# Patient Record
Sex: Male | Born: 1945 | Race: White | Hispanic: No | Marital: Married | Smoking: Current some day smoker
Health system: Southern US, Community
[De-identification: ages and names within clinical notes are randomized; demographics above are authoritative.]

## PROBLEM LIST (undated history)

## (undated) DIAGNOSIS — G5600 Carpal tunnel syndrome, unspecified upper limb: Secondary | ICD-10-CM

## (undated) DIAGNOSIS — J449 Chronic obstructive pulmonary disease, unspecified: Secondary | ICD-10-CM

## (undated) DIAGNOSIS — I2510 Atherosclerotic heart disease of native coronary artery without angina pectoris: Secondary | ICD-10-CM

## (undated) DIAGNOSIS — C3490 Malignant neoplasm of unspecified part of unspecified bronchus or lung: Secondary | ICD-10-CM

## (undated) DIAGNOSIS — K219 Gastro-esophageal reflux disease without esophagitis: Secondary | ICD-10-CM

## (undated) DIAGNOSIS — M1990 Unspecified osteoarthritis, unspecified site: Secondary | ICD-10-CM

## (undated) DIAGNOSIS — C7931 Secondary malignant neoplasm of brain: Secondary | ICD-10-CM

## (undated) DIAGNOSIS — G709 Myoneural disorder, unspecified: Secondary | ICD-10-CM

## (undated) DIAGNOSIS — E785 Hyperlipidemia, unspecified: Secondary | ICD-10-CM

## (undated) DIAGNOSIS — Z923 Personal history of irradiation: Secondary | ICD-10-CM

## (undated) DIAGNOSIS — Z95820 Peripheral vascular angioplasty status with implants and grafts: Secondary | ICD-10-CM

## (undated) DIAGNOSIS — R0602 Shortness of breath: Secondary | ICD-10-CM

## (undated) DIAGNOSIS — G629 Polyneuropathy, unspecified: Secondary | ICD-10-CM

## (undated) DIAGNOSIS — N400 Enlarged prostate without lower urinary tract symptoms: Secondary | ICD-10-CM

## (undated) DIAGNOSIS — R21 Rash and other nonspecific skin eruption: Secondary | ICD-10-CM

## (undated) DIAGNOSIS — I82409 Acute embolism and thrombosis of unspecified deep veins of unspecified lower extremity: Secondary | ICD-10-CM

## (undated) DIAGNOSIS — M549 Dorsalgia, unspecified: Secondary | ICD-10-CM

## (undated) DIAGNOSIS — I219 Acute myocardial infarction, unspecified: Secondary | ICD-10-CM

## (undated) DIAGNOSIS — G8929 Other chronic pain: Secondary | ICD-10-CM

## (undated) DIAGNOSIS — I10 Essential (primary) hypertension: Secondary | ICD-10-CM

## (undated) DIAGNOSIS — G5601 Carpal tunnel syndrome, right upper limb: Secondary | ICD-10-CM

## (undated) HISTORY — PX: EPIDURAL BLOCK INJECTION: SHX1516

## (undated) HISTORY — PX: COLONOSCOPY W/ POLYPECTOMY: SHX1380

## (undated) HISTORY — DX: Rash and other nonspecific skin eruption: R21

---

## 1997-11-12 HISTORY — PX: CERVICAL FUSION: SHX112

## 1998-02-10 ENCOUNTER — Encounter
Admission: RE | Admit: 1998-02-10 | Discharge: 1998-05-11 | Payer: Self-pay | Admitting: Physical Medicine & Rehabilitation

## 1998-04-18 ENCOUNTER — Encounter: Admission: RE | Admit: 1998-04-18 | Discharge: 1998-07-17 | Payer: Self-pay | Admitting: Anesthesiology

## 1998-08-01 ENCOUNTER — Encounter: Admission: RE | Admit: 1998-08-01 | Discharge: 1998-10-20 | Payer: Self-pay | Admitting: Anesthesiology

## 1998-10-20 ENCOUNTER — Encounter: Admission: RE | Admit: 1998-10-20 | Discharge: 1999-01-18 | Payer: Self-pay | Admitting: Anesthesiology

## 1998-11-24 ENCOUNTER — Ambulatory Visit (HOSPITAL_COMMUNITY): Admission: RE | Admit: 1998-11-24 | Discharge: 1998-11-24 | Payer: Self-pay | Admitting: Anesthesiology

## 1998-11-24 ENCOUNTER — Encounter: Payer: Self-pay | Admitting: Anesthesiology

## 1999-01-19 ENCOUNTER — Encounter: Admission: RE | Admit: 1999-01-19 | Discharge: 1999-04-19 | Payer: Self-pay | Admitting: Anesthesiology

## 1999-02-03 ENCOUNTER — Encounter: Payer: Self-pay | Admitting: Cardiovascular Disease

## 1999-02-03 ENCOUNTER — Ambulatory Visit (HOSPITAL_COMMUNITY): Admission: RE | Admit: 1999-02-03 | Discharge: 1999-02-03 | Payer: Self-pay | Admitting: Cardiovascular Disease

## 1999-02-03 HISTORY — PX: CARDIAC CATHETERIZATION: SHX172

## 1999-04-20 ENCOUNTER — Encounter: Admission: RE | Admit: 1999-04-20 | Discharge: 1999-07-06 | Payer: Self-pay | Admitting: Anesthesiology

## 1999-07-04 ENCOUNTER — Encounter: Payer: Self-pay | Admitting: Neurosurgery

## 1999-07-04 ENCOUNTER — Ambulatory Visit (HOSPITAL_COMMUNITY): Admission: RE | Admit: 1999-07-04 | Discharge: 1999-07-04 | Payer: Self-pay | Admitting: Neurosurgery

## 1999-07-06 ENCOUNTER — Encounter: Admission: RE | Admit: 1999-07-06 | Discharge: 1999-10-04 | Payer: Self-pay | Admitting: Anesthesiology

## 1999-10-03 ENCOUNTER — Encounter: Payer: Self-pay | Admitting: Family Medicine

## 1999-10-03 ENCOUNTER — Encounter: Admission: RE | Admit: 1999-10-03 | Discharge: 1999-10-03 | Payer: Self-pay | Admitting: Family Medicine

## 1999-10-12 ENCOUNTER — Encounter: Admission: RE | Admit: 1999-10-12 | Discharge: 2000-01-10 | Payer: Self-pay | Admitting: Anesthesiology

## 2000-01-10 ENCOUNTER — Encounter: Admission: RE | Admit: 2000-01-10 | Discharge: 2000-01-22 | Payer: Self-pay | Admitting: Anesthesiology

## 2000-02-19 ENCOUNTER — Encounter: Admission: RE | Admit: 2000-02-19 | Discharge: 2000-05-19 | Payer: Self-pay | Admitting: Anesthesiology

## 2000-03-24 ENCOUNTER — Ambulatory Visit (HOSPITAL_COMMUNITY): Admission: RE | Admit: 2000-03-24 | Discharge: 2000-03-24 | Payer: Self-pay | Admitting: Neurosurgery

## 2000-03-24 ENCOUNTER — Encounter: Payer: Self-pay | Admitting: Neurosurgery

## 2000-04-04 ENCOUNTER — Ambulatory Visit (HOSPITAL_COMMUNITY): Admission: RE | Admit: 2000-04-04 | Discharge: 2000-04-04 | Payer: Self-pay | Admitting: Neurosurgery

## 2000-04-04 ENCOUNTER — Encounter: Payer: Self-pay | Admitting: Neurosurgery

## 2001-08-11 ENCOUNTER — Emergency Department (HOSPITAL_COMMUNITY): Admission: EM | Admit: 2001-08-11 | Discharge: 2001-08-12 | Payer: Self-pay | Admitting: Emergency Medicine

## 2001-08-11 ENCOUNTER — Encounter: Payer: Self-pay | Admitting: Emergency Medicine

## 2002-11-12 HISTORY — PX: BACK SURGERY: SHX140

## 2002-12-23 ENCOUNTER — Encounter: Payer: Self-pay | Admitting: Occupational Medicine

## 2002-12-23 ENCOUNTER — Encounter: Admission: RE | Admit: 2002-12-23 | Discharge: 2002-12-23 | Payer: Self-pay | Admitting: Occupational Medicine

## 2003-03-12 ENCOUNTER — Encounter: Payer: Self-pay | Admitting: Specialist

## 2003-03-12 ENCOUNTER — Ambulatory Visit (HOSPITAL_COMMUNITY): Admission: RE | Admit: 2003-03-12 | Discharge: 2003-03-12 | Payer: Self-pay | Admitting: Specialist

## 2003-03-18 ENCOUNTER — Ambulatory Visit: Admission: RE | Admit: 2003-03-18 | Discharge: 2003-03-18 | Payer: Self-pay | Admitting: Specialist

## 2003-11-13 DIAGNOSIS — I219 Acute myocardial infarction, unspecified: Secondary | ICD-10-CM

## 2003-11-13 HISTORY — DX: Acute myocardial infarction, unspecified: I21.9

## 2004-01-03 ENCOUNTER — Ambulatory Visit (HOSPITAL_COMMUNITY): Admission: RE | Admit: 2004-01-03 | Discharge: 2004-01-03 | Payer: Self-pay | Admitting: Anesthesiology

## 2004-02-06 ENCOUNTER — Observation Stay (HOSPITAL_COMMUNITY): Admission: EM | Admit: 2004-02-06 | Discharge: 2004-02-08 | Payer: Self-pay | Admitting: Emergency Medicine

## 2004-02-07 HISTORY — PX: CARDIAC CATHETERIZATION: SHX172

## 2005-02-10 HISTORY — PX: KNEE ARTHROSCOPY: SHX127

## 2005-02-21 ENCOUNTER — Ambulatory Visit (HOSPITAL_BASED_OUTPATIENT_CLINIC_OR_DEPARTMENT_OTHER): Admission: RE | Admit: 2005-02-21 | Discharge: 2005-02-21 | Payer: Self-pay | Admitting: Specialist

## 2005-09-21 ENCOUNTER — Encounter: Admission: RE | Admit: 2005-09-21 | Discharge: 2005-09-21 | Payer: Self-pay | Admitting: Family Medicine

## 2006-05-03 ENCOUNTER — Encounter: Admission: RE | Admit: 2006-05-03 | Discharge: 2006-05-03 | Payer: Self-pay | Admitting: *Deleted

## 2006-11-12 HISTORY — PX: JOINT REPLACEMENT: SHX530

## 2006-11-12 HISTORY — PX: HERNIA REPAIR: SHX51

## 2007-06-27 ENCOUNTER — Inpatient Hospital Stay (HOSPITAL_COMMUNITY): Admission: RE | Admit: 2007-06-27 | Discharge: 2007-07-01 | Payer: Self-pay | Admitting: Orthopaedic Surgery

## 2007-07-31 ENCOUNTER — Encounter: Admission: RE | Admit: 2007-07-31 | Discharge: 2007-10-29 | Payer: Self-pay | Admitting: Orthopaedic Surgery

## 2007-10-31 ENCOUNTER — Ambulatory Visit (HOSPITAL_COMMUNITY): Admission: RE | Admit: 2007-10-31 | Discharge: 2007-10-31 | Payer: Self-pay | Admitting: Surgery

## 2007-11-13 HISTORY — PX: MANIPULATION KNEE JOINT: SUR841

## 2008-01-23 ENCOUNTER — Ambulatory Visit (HOSPITAL_COMMUNITY): Admission: RE | Admit: 2008-01-23 | Discharge: 2008-01-24 | Payer: Self-pay | Admitting: Orthopaedic Surgery

## 2008-10-15 ENCOUNTER — Encounter: Admission: RE | Admit: 2008-10-15 | Discharge: 2008-10-15 | Payer: Self-pay | Admitting: Gastroenterology

## 2010-10-27 ENCOUNTER — Ambulatory Visit: Payer: Self-pay | Admitting: Pulmonary Disease

## 2011-03-27 NOTE — Op Note (Signed)
NAME:  Andrew Shaw, PAVEK NO.:  [id number]   MEDICAL RECORD NO.:  [id number]          PATIENT TYPE:  AMB   LOCATION:  SDS                          FACILITY:  [facility]   PHYSICIAN:  Ardeth Sportsman, MD     DATE OF BIRTH:  November 09, 1946   DATE OF PROCEDURE:  10/31/2007  DATE OF DISCHARGE:  10/31/2007                               OPERATIVE REPORT   PRIMARY CARE PHYSICIAN:  L. Lupe Carney, M.D.   SURGEON:  Ardeth Sportsman, MD.   PREOPERATIVE DIAGNOSIS:  Umbilical hernia.   POSTOPERATIVE DIAGNOSIS:  Umbilical hernia.   PROCEDURE PERFORMED:  Open umbilical hernia repair with underlay mesh.   ANESTHESIA:  1. MAC/laryngeal mask airway.  2. Local anesthetic in a field block.   SPECIMENS:  None.   DRAINS:  None.   ESTIMATED BLOOD LOSS:  Less than 5 mL.   COMPLICATIONS:  None apparent.   INDICATIONS:  Mr. Andrew Shaw is a 65 year old gentleman who has had a known  umbilical hernia with increasing symptoms and pain and discomfort.  He  also noted he had some right-sided abdominal pain but notes it seems to  be related to when his umbilical hernia herniates he gets pain on the  right side.   Anatomy and physiology of abdominal formation and pathophysiology of  umbilical herniation with its risks of incarceration, strangulation and  worsening pain was discussed.  Options were discussed and recommendation  was made for umbilical hernia repair with mesh.  Risks such as stroke,  heart attack, deep venous thrombosis, pulmonary embolism and death were  discussed.  Risks such as bleeding, need for transfusion, wound  infection, abscess, injury to other organs, prolonged pain, hernia  recurrence, hematoma, seroma and other risks were discussed.  He agreed  to proceed.   OPERATIVE FINDINGS:  He had a 12 mm umbilical hernia defect within the  stalk of the umbilicus.  It had omentum within it but was not frankly  incarcerated or strangulated.   PROCEDURE IN DETAIL:  Informed  consent was confirmed.  The patient  voided just prior to going to the operating room.  He had sequential  compression devices active during the entire case.  He had IV  antibiotics given.  He underwent deep sedation with laryngeal mask  airway without any difficulty.  His abdomen was clipped, prepped and  draped in sterile fashion.  Field block was placed.   A 2 cm infraumbilical curvilinear incision was made.  Blunt dissection  was able to get around the umbilical stalk.  The umbilical stalk was  able to be freed off the anterior wall and encountered hernia sac.  There was no breach into the peritoneal cavity.  I was able to bluntly  get any preperitoneal plane circumferentially with my pinky finger out  to about 4 cm with good clear return.  Hemostasis was excellent.  Inspection was made.  There was no breach of the peritoneum.  Given the  fact that the peritoneum was intact I decided to do an underlay repair.  Ultrapro lightweight polypropylene mesh was cut to a 3 x 3  cm shape.  It  was placed into the wound as initially sort of a plug type and then the  edges were gently folded out to lay flat in the preperitoneal plane.  The mesh was secured using #1 Ethibond interrupted stitches in four  corners taking good 1 cm bites.  This was done in a tension free manner.  The wound itself was closed primarily using #1 Ethibond interrupted  stitches x3.  The central stitch helped tack the umbilicus back down.  This provided good closure.  Wound was closed using 3-0 Vicryl deep  dermal stitches and running 4-0 Monocryl stitch.  Sterile dressings were  applied.  The patient was extubated and sent to the recovery room in  stable condition.  I had discussed postoperative care with the patient  just prior to surgery and about to discuss it with his wife.      Ardeth Sportsman, MD  Electronically Signed     SCG/MEDQ  D:  10/31/2007  T:  10/31/2007  Job:  [id number]   cc:   L. Lupe Carney,  M.D.

## 2011-03-27 NOTE — Discharge Summary (Signed)
NAME:  Andrew Shaw, Andrew Shaw NO.:  [id number]   MEDICAL RECORD NO.:  [id number]          PATIENT TYPE:  REC   LOCATION:  OREH                         FACILITY:  [facility]   PHYSICIAN:  Vanita Panda. Magnus Ivan, M.D.DATE OF BIRTH:  August 17, 1946   DATE OF ADMISSION:  06/27/2007  DATE OF DISCHARGE:  07/01/2007                               DISCHARGE SUMMARY   ADMITTING DIAGNOSIS:  Left knee severe degenerative joint disease.   DISCHARGE DIAGNOSIS:  Left knee severe degenerative joint disease.   PROCEDURE:  Left total knee replacement.   HOSPITAL COURSE:  Briefly, [name] is a 65 year old gentleman with  debilitating left knee pain.  This started from an original injury years  ago and he had plain films that documented bone on bone wear of the  lateral compartment of his knee as well as significant tricompartmental  arthritis.  This has definitely affected his activities of daily living,  and had failed conservative therapy and treatments.  It was recommended  he undergo total knee replacement.  The risks and benefits of this were  explained.  He understood and agreed to proceed with surgery.   PROCEDURE:  After informed consent obtained, [name] was brought into  the hospital on the day of admission and underwent a left total knee  arthroplasty without complications.  For detailed description of the  operation, please refer to the operative note in the patient's medical  record.  Postoperatively, he was admitted to regular floor bed.  His  postoperative course was uncomplicated.  He was started on Coumadin for  DVT prophylaxis, and by the day of discharge, he was therapeutic on his  Coumadin.  He is on chronic pain medications and has a pain contract for  this.  We started working him on CPM for range of motion of his knee,  and he worked with physical therapy and occupational therapy.  By the  day of discharge, he was tolerating oral diet as well as oral pain  medications and he was cleared from a physical therapy standpoint for  discharge to home with home health PT, OT.   DISPOSITION:  To home.   DISCHARGE INSTRUCTIONS:  While at home, he will continue all the same  home medications including his pain medications as before.  His Coumadin  will be dosed through the home health pharmacy and he will follow-up in  the office in 2 weeks.      Vanita Panda. Magnus Ivan, M.D.  Electronically Signed     CYB/MEDQ  D:  07/29/2007  T:  07/30/2007  Job:  [id number]

## 2011-03-27 NOTE — Op Note (Signed)
NAME:  Andrew Shaw, Andrew Shaw NO.:  [id number]   MEDICAL RECORD NO.:  [id number]          PATIENT TYPE:  OIB   LOCATION:  5037                         FACILITY:  [facility]   PHYSICIAN:  Vanita Panda. 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   PROCEDURE:  Left knee manipulation under anesthesia.   SURGEON:  Vanita Panda. Magnus Ivan, M.D.   ANESTHESIA:  1. Left femoral block.  2. General anesthesia.   COMPLICATIONS:  None.   PREOPERATIVE RANGE OF MOTION:  Minus 15 degrees extension to 85 degrees  flexion.   POSTOPERATIVE RANGE OF MOTION:  Minus 5 degrees of extension to 115  degrees flexion.   INDICATIONS:  Briefly Mr. Krapf is a 65 year old male who in August  underwent a total knee arthroplasty.  This was a Workers' Compensation  case.  He had significant pain postoperatively, and this greatly  affected his range of motion of the knee.  After continued attempts to  increase his motion with physical therapy, I recommended several months  ago, that he undergo manipulation under see anesthesia.  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  He now presents for  manipulation under anesthesia.   I have told him that this may not work, and at some point he may need an  arthrotomy with a polyexchange, but we wanted to try as least invasive  as possible.  The risks, and benefits of this were explained him, and he  understood the need to proceed with surgery.   DESCRIPTION OF PROCEDURE:  After informed consent was obtained the  appropriate left leg was marked, a femoral block  with obtained by  anesthesia.  He was then brought to the operating room and placed supine  on the operating table.  General anesthesia was then obtained.  Preoperatively, I measured his range of motion to be minus 10-15 degrees  of extension, to only 85 degrees of flexion.  After manipulating his  knee gently with pressure for 30 minutes, I was able to get him to -5  degrees of extension to a 115 2 degrees of flexion.  The knee cap was  easily mobile following this as well.   The patient was then awakened, extubated, and taken to the recovery room  in stable condition.  There no complications noted.  Postoperatively I  will have him start in a CPM with full extension and flexion, and will  set him up for home health therapy and physical therapy as well.      Vanita Panda. Magnus Ivan, M.D.  Electronically Signed     CYB/MEDQ  D:  01/23/2008  T:  01/24/2008  Job:  [id number]

## 2011-03-27 NOTE — Op Note (Signed)
NAME:  NILAY, MANGRUM NO.:  [id number]   MEDICAL RECORD NO.:  [id number]          PATIENT TYPE:  INP   LOCATION:  2899                         FACILITY:  [facility]   PHYSICIAN:  Vanita Panda. Magnus Ivan, M.D.DATE OF BIRTH:  30-Nov-1945   DATE OF PROCEDURE:  06/27/2007  DATE OF DISCHARGE:                               OPERATIVE REPORT   PREOPERATIVE DIAGNOSIS:  Severe degenerative joint disease, left knee.   POSTOPERATIVE DIAGNOSIS:  Severe degenerative joint disease, left knee.   PROCEDURE:  Left total knee arthroplasty.   COMPONENTS:  DePuy cruciate-substituting femoral component, size 4,  rotating platform tibial insert, size 4, size 38-mm oval patellar dome,  a 10-mm polyethylene insert.   SURGEON:  Vanita Panda. Magnus Ivan, MD   ASSISTANT:  Maud Deed, PA-C   ANESTHESIA:  General.   ANTIBIOTICS:  One gram IV Ancef.   TOURNIQUET TIME:  Two hours.   BLOOD LOSS:  100 mL.   COMPLICATIONS:  None.   INDICATIONS:  Briefly, Mr. Weier is a 65 year old male with a long  history of left knee pain; this started after work-related accident.  He  eventually went on to develop bone-on-bone wear on the lateral side of  his knee.  He had debilitating pain from this and was it recommended to  his 2 different physicians that he undergo a total knee replacement.  The risks and benefits of this were explained to him, well-understood  and eventually due to the effect on his activities of daily living, he  agreed proceed with surgery.   PROCEDURE DESCRIPTION:  After informed consent was obtained, the  appropriate left leg was marked and he was brought to the operating room  and placed supine on the operating table.  General anesthesia was then  obtained.  A nonsterile tourniquet was placed around his upper leg and a  Foley catheter was inserted.  A time-out was called to identify the  correct patient and the correct extremity.  His leg was prepped and  draped with  DuraPrep and sterile drape and an Esmarch was used to wrap  out the leg and the tourniquet was inflated to 250 mm of pressure.  Again, a time-out was called to identify the correct patient and the  correct left extremity.  We then made a midline incision directly over  the patella and carried this down to the level of the tibial tubercle  and slightly proximally and carried this to the soft tissue.  Next, a  medial parapatellar arthrotomy was taken and an effusion was drained  from the knee.  With the leg flexed and the patella inverted, I cleaned  the knee of osteophytes and soft tissue debris.  Next, we proceeded with  the navigation portion of the case.  Just outside the distal tibial  incision, 2 small stab incisions were made on the medial tibia and 2  Steinmann pins were inserted in the anteromedial to the posterolateral  direction.  Several pins were then placed in the femur within the  incision from an anteromedial to posterolateral direction.  The  navigation portion of the case was then  carried out to allow [place] to make  the appropriate cuts.  With the knee flexed, a tibial cut was then made  based off of the low side.  Once this was accomplished, a femoral  cutting guide was placed and the distal femoral cut was made, then the  chamfer cuts were made with the anterior and posterior and the chamfer  cuts .  The knee was cleaned of further soft tissue debris and balancing  was performed, releasing tissue from the medial side of the knee.  The  navigation confirmed that it was a well-balanced knee.  A size 4 femoral  trial insert was then placed followed by a size 4 tibia and the tibia  was prepared with tibial tunnel and reaming, which was reamed.  Then a  size 10-mm insert was inserted.  I put the knee through a range of  motion with the trial components in place and it was found to be full  and this was verified in terms of balancing under computer navigation.  Next, the patella  was prepared and we took approximately 8-9 mm of a cut  off of the patella and a size 38 patellar trial insert was used after  drilling the hole through the patella guide.  All trial components were  then removed and we thoroughly irrigated the knee copiously with 1500 mL  of normal saline solution using pulsatile lavage.  The knee was then  dried and the cement was mixed.  Cement was placed on the tibial  component and the tibia and the tibial component was put into place.  Next, the femoral component was cemented into place and finally of the  regular 10-mm insert was placed.  I then cemented the patella dome in  place as well.  Once the cement had dried, we cleaned the knee of  further cement debris.  The tourniquet was let down at 2 hours.  Hemostasis was obtained with electrocautery.  We then closed the  arthrotomy with interrupted #1 Vicryl suture followed by 2-0 Vicryl in  the subcutaneous tissue and staples on the skin.  A Hemovac was not  placed.  Xeroform followed by a well-padded sterile dressing and knee  immobilizer were placed in the knee.  The patient was awakened,  extubated and taken to the recovery room in stable condition.      Vanita Panda. Magnus Ivan, M.D.  Electronically Signed     CYB/MEDQ  D:  06/27/2007  T:  06/28/2007  Job:  [id number]

## 2011-03-30 NOTE — Consult Note (Signed)
[facility]  Patient:    Andrew Shaw, Andrew Shaw                     MRN: [id number] Proc. Date: 03/25/00 Adm. Date:  [id number] Attending:  Thyra Breed CC:         Tanya Nones. Jeral Fruit, M.D.                          Consultation Report  HISTORY:  [name] comes in for followup evaluation of his chronic low back pain with radiculopathy into the right lower extremity.  The patient had a repeat MRI done recently which shows ongoing degenerative disc disease with annular rent at L4-5.  He has not discussed this with Dr. Tanya Nones. Botero who obtained the procedure.  He has been out to [place] and back and has noted an increased pain as a result of his travels back and forth.  He is taking 40 mg of OxyContin three times a day and his Lipitor.  EXAMINATION:  VITAL SIGNS:  Blood pressure is 132/68, heart rate is 91, respiratory rate is 18, O2 saturation is 97%, pain level is 8/10 and temperature is 97.3.  NEUROLOGIC:  Straight leg raise signs were positive on the right side.  Deep tendon reflexes showed 1 to 2+ right knee, 2+ to 3+ at the left knee, ankles were symmetric.  He has attenuated pinprick over the anterolateral aspect to the right side.  IMPRESSION: 1. 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 2. Other medical problems per primary care physician.  DISPOSITION: 1. Continue on OxyContin 40 mg one p.o. t.i.d., #90 with no refill. 2. Patient plans to follow up with Dr. Jeral Fruit with results of his MRI. 3. Remain off of trazodone. 4. Follow up with me in four weeks. DD:  03/25/00 TD:  03/27/00 Job: [id number]

## 2011-03-30 NOTE — Consult Note (Signed)
[facility]  Patient:    Andrew Shaw, Andrew Shaw                     MRN: [id number] Proc. Date: 04/12/00 Adm. Date:  [id number] Attending:  Thyra Breed CC:         Tanya Nones. Jeral Fruit, M.D.             Donzetta Sprung. Roney Jaffe., MD                          Consultation Report  BRIEF FOLLOW-UP:  Andrew Shaw came in to see me today upset because he apparently was scheduled for surgery next week but was cancelled due to what sounds like a misunderstanding.  He had left his bottle of medications in his truck, which he had gone up to [place], [place], to the race track, and he had asked Dr. Jeral Fruit for enough medication until he got his bottle back, and apparently this was misconstrued as an indication that he had already taken his entire amount for a month within 10 days.  Dr. Jeral Fruit apparently discharged him, understandably with the impression he had.  The patients surgery was cancelled.  His work place has made tremendous arrangements so that he could have his surgery, and he is very worried about how they will facilitate that end of his current situation.  He continues on his OxyContin at 40 mg t.i.d. and has his prescription bottle with him as well as his Lipitor.  We went ahead and contacted Drs. Kritzer, Pool, and Crams office to see whether they could see him.  He has his x-rays.  They will see if they can see him in the near future.  PHYSICAL EXAMINATION:  VITAL SIGNS:  Blood pressure 133/77, heart rate 94, respiratory rate 16, O2 saturation 98%, pain level is 7 out of 10, temperature is 97.2.  I did not do an exam other than this.  IMPRESSION: 1. 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  Possibly the    etiology of this pain is underlying spondylosis. 2. Other medical problems per primary care physician.  DISPOSITION: 1. Continue on his current dose of OxyContin. 2. Follow up with me in four weeks. DD:  04/12/00 TD:  04/18/00 Job:  [id number]

## 2011-03-30 NOTE — H&P (Signed)
NAME:  Andrew Shaw, Andrew Shaw NO.:  [id number]   MEDICAL RECORD NO.:  [id number]                   PATIENT TYPE:  LAMB   LOCATION:                                       FACILITY:  [facility]   PHYSICIAN:  Javier Docker, M.D.              DATE OF BIRTH:  Sep 25, 1946   DATE OF ADMISSION:  DATE OF DISCHARGE:                                HISTORY & PHYSICAL   CHIEF COMPLAINT:  Left knee pain.   HISTORY:  The patient is a 65 year old gentleman who first presented to our  office in March 2004.  The patient fell at work on 12/22/2002 hitting the  left medial aspect of his knee.  The patient was initially seen by a company  physician who placed him on anti-inflammatories and analgesics.  X-rays  taken at [facility] revealed degenerative changes.  On initial exam in our  office the patient was tender to palpation along the medial joint line.  He  had full range of motion however, pain with both full flexion and full  extension.  No ligament instability was noted at the time.  We proceeded  with a corticosteroid injection at that point.  The patient however, did not  get significant relief from this so MRI was obtained which revealed medial  meniscal tear involving the posterior horn and body with extension to the  inferior surface, also advanced lateral compartment degenerative changes.  Due to the fact the patient has failed to respond to conservative treatment  it is felt he would benefit from arthroscopic debridement and partial medial  meniscectomy.  The risks and benefits of the surgery were discussed in  detail with the patient by Dr. Shelle Iron and he wishes to proceed.   PAST MEDICAL HISTORY:  Medical history significant for hiatal hernia.   CURRENT MEDICATIONS:  Current medications include Vicodin and Celebrex.   ALLERGY:  FLEXERIL which causes a rash.   PAST SURGICAL HISTORY:  Left knee arthroscopy, cervical fusion, lumbar micro  disk.   SOCIAL HISTORY:   The patient is married.  He works as a Naval architect.  He is  a 40 pack-year smoker.  Has stopped alcohol intake.  Wife will be his  caregiver following surgery.   FAMILY HISTORY:  Family history significant for sister had diabetes; she is  deceased.  Mother deceased of old age.  Family history of CVA.   REVIEW OF SYSTEMS:  GENERAL: The patient does admit to over the past 2  months a weight loss of approximately 12 pounds.  Denies any fevers, chills,  or night sweats or bleeding tendencies.  CNS: No blurry or double vision,  seizure, headache, or paralysis.  RESPIRATORY: No shortness of breath,  productive cough, or hemoptysis.  CARDIOVASCULAR: No chest pain, angina,  orthopnea.  GU: No dysuria or hematuria or discharge.  GI: No nausea,  vomiting, constipation, melena, or bloody stools.  PHYSICAL EXAMINATION:  VITAL SIGNS:  Pulse is 70, respiratory rate 16, BP  130/82, weight is 155.  GENERAL:  Well-developed, well-nourished 65 year old gentleman who is in  moderate distress and very anxious in nature.  HEENT:  Atraumatic, normocephalic.  Pupils equal, round and reactive to  light.  EOMs intact.  NECK:  Supple; no lymphadenopathy; no carotid bruits can be auscultated.  CHEST:  Clear to auscultation bilaterally; no rhonchi or wheezes.  BREASTS:  Not examined/not pertinent.  GU:  Not examined/not pertinent.  HEART:  Regular rate and rhythm without murmurs, gallops, or rubs.  ABDOMEN:  Soft, nontender, nondistended, bowel sounds x4.  SKIN:  No rashes or lesions are noted.  EXTREMITIES:  The patient is tender to palpation along the medial joint  line.  Range of motion is -2 to 140.   DIAGNOSTIC STUDIES:  MRI shows medial meniscal tear into the posterior horn.   IMPRESSION:  1. Left medial meniscal tear.  2. Hiatal hernia.   PLAN:  The patient will undergo left knee arthroscopy with partial medial  meniscectomy and debridement by Dr. Jene Every at [facility] on   03/12/2003.     Roma Schanz, P.A.                   Javier Docker, M.D.    CS/MEDQ  D:  03/04/2003  T:  03/04/2003  Job:  [id number]

## 2011-03-30 NOTE — Discharge Summary (Signed)
NAME:  Andrew Shaw, Andrew Shaw NO.:  [id number]   MEDICAL RECORD NO.:  [id number]                   PATIENT TYPE:  INP   LOCATION:  3703                                 FACILITY:  [facility]   PHYSICIAN:  Nanetta Batty, M.D.                DATE OF BIRTH:  12/16/1945   DATE OF ADMISSION:  02/06/2004  DATE OF DISCHARGE:  02/08/2004                                 DISCHARGE SUMMARY   DISCHARGE DIAGNOSES:  1. Unstable angina pectoris, ruled out for myocardial infarction by serial     enzymes.  2. Status post catheterization revealing noncritical coronary artery disease     requiring medical therapy.  3. Premature family history of coronary artery disease.  4. Ongoing tobacco use.  5. Hyperlipidemia, started on statins this admission.  6. Hypertension.   HISTORY OF PRESENT ILLNESS:  A 65 year old Caucasian gentleman presented to  the emergency room with complaints of chest pain off and on for one week and  at the day of presentation, the pain was the worst with diaphoresis, nausea,  and radiation to the left arm.  Also he felt mid sternal chest pressure.  He  called EMS and was delivered to the emergency room and on the way to the  emergency room, was given sublingual nitroglycerin and aspirin with partial  relief of pain.   HOSPITAL COURSE:  He was admitted to [facility] on rule  out MI protocol on IV heparin and nitroglycerin.   His enzymes were negative x3.  EKG done and did not show any acute ST or T-  wave abnormalities but showed normal sinus rhythm with some nonspecific ST  changes.   The patient underwent cardiac catheterization on February 07, 2004, by Dr.  Allyson Sabal.  Catheterization revealed ejection fraction within normal limits, 60%  ostial LAD stenosis, 30 to 40% mid LAD lesions with distal diffuse disease  on the LAD territory, circumflex had mid portion 60% stenosis, RCA had a  lesion of 40% in the proximal part and hypodense region  in the mid part with  50 to 60% narrowing.  His renal arteries and elective peripheral angio-  abdominal aorta was normal __________.   The patient tolerated the procedure well, was transferred to the unit in  stable condition and recommendations for continuous medical therapy.   The next morning Dr. Alanda Amass assessed the patient and found him to be  stable from a cardiovascular standpoint for discharge home.  His blood  pressure was 115/68, heart rate 77, he was afebrile, O2 saturations were 94%  on room air.   LABORATORY DATA:  His hemoglobin was 14.4, hematocrit 31.5, white blood cell  count 7.4, platelets 228 on the day after catheterization.  BUN was 7,  creatinine 0.9, potassium 3.7, glucose 111 post catheterization.   Lipid profile revealed total cholesterol 228, triglycerides 256, HDL 33, LDL  194.   The patient maintained  sinus rhythm throughout admission.   DISCHARGE INSTRUCTIONS:  No lifting greater than five pounds, no driving, no  strenuous activity for three days post catheterization.  She was instructed  to report any problems with groin puncture site to Dr. Hazle Coca office and  phone number was provided.   DISCHARGE DIET:  Low fat, low cholesterol diet.   DISCHARGE MEDICATIONS:  1. Wellbutrin 150 mg one pill daily for seven days then one pill twice a     day.  2. Protonix 40 mg daily.  3. Lopressor 25 mg b.i.d.  4. Lipitor 40 mg daily.  5. Aspirin 81 mg daily.   FOLLOW UP:  Scheduled in our office for Persantine stress test on February 11, 2004, at 10:45; then Dr. Allyson Sabal will see the patient on February 15, 2004, at  11:30 a.m.      Raymon Mutton, P.A.                    Nanetta Batty, M.D.    MK/MEDQ  D:  02/08/2004  T:  02/08/2004  Job:  [id number]

## 2011-03-30 NOTE — Cardiovascular Report (Signed)
NAME:  LEBARON, BAUTCH NO.:  [id number]   MEDICAL RECORD NO.:  [id number]                   PATIENT TYPE:  INP   LOCATION:  3703                                 FACILITY:  [facility]   PHYSICIAN:  Nanetta Batty, M.D.                DATE OF BIRTH:  Sep 28, 1946   DATE OF PROCEDURE:  DATE OF DISCHARGE:                              CARDIAC CATHETERIZATION   PROCEDURES PERFORMED:   CARDIOLOGIST:  Nanetta Batty, M.D.   INDICATIONS FOR PROCEDURE:  Mr. Senger is a 65 year old married white male  with a history of chronic low-back pain, normal ___________ cath eight to  nine years ago and hiatal hernia. He does give a history of smoking and  there is a positive family history for heart disease.  He has had a week of  crescendo angina and presented to the [facility] ER yesterday where he was placed  on IV heparin and nitro.  His pain subsided.  He ruled out for a myocardial  infarction.  He had no EKG changes.  He presents now for diagnostic coronary  arteriography to rule out ischemic etiology.   PROCEDURE DESCRIPTION:  The patient was brought to the second floor [facility] cardiac cath lab in the post absorptive state.  He was premedicated  with p.o. Valium.  His right groin was prepped and shaved in the usual  sterile fashion.  One percent Xylocaine was used for local anesthesia.  A 6  French sheath was inserted into the right femoral artery using the standard  Seldinger technique.  Six French right and left Judkins diagnostic catheters  as well as a 6 French pigtail catheter were used for selective coronary  angiography, left ventriculography and distal abdominal aortography.   Omnipaque dye was used for the entirety of the case.   Retrograde aortic, left ventricular and pullback pressures were recorded.   HEMODYNAMIC DATA:  1. Aortic systolic pressure 102, diastolic pressure  63.  2. Left ventricular systolic pressure 108, end-diastolic pressure 13.   SELECTIVE CORONARY ANGIOGRAPHY:  1. Left Main:  Left main normal.   1. LAD:  The LAD had a 60% ostial lesion followed by a 30-40% segmental mid     and diffuse distal disease at the apex.   1. Ramus Intermedius Branch:  The ramus intermedius branch in a large     distally bifurcating vessel which is free of systemic disease.   1. Left Circumflex:  The left circumflex is a small nondominant vessel with,     at most, 60% mid focal stenosis.  This was, at most, a 2 mm vessel.   1. Right Coronary Artery:  The right coronary artery is a large dominant     vessel with 40% proximal followed by 50-60% hypodense mid lesion.   VENTRICULOGRAPHIC DATA:  Left Ventriculography:  RAO left ventriculogram was  performed using 25 mL of Omnipaque day at 12 mL per second.  The overall  LVEF was estimated at greater than 60% without focal wall motion  abnormalities.   AORTOGRAPHIC DATA:  Distal Abdominal Aortogram:  Distal abdominal  aortography was performed using 20 mL of Omnipaque dye at 20 mL per second.  The renal arteries were widely patent.   ______________ abdominal aorta and iliac bifurcation appear free of  significant atherosclerotic changes.   IMPRESSION:  Mr. Cosma has noncritical coronary artery disease with normal  left ventricular function.  I think his chest pains maybe noncardiac.   The patient will be treated empirically with antireflux agents and will be  discharged home with medical therapy.  He will see Dr. Daphene Jaeger back in the  office in approximately two weeks for follow up.   The patient left the lab in stable condition.                                               Nanetta Batty, M.D.    [name]  D:  02/07/2004  T:  02/07/2004  Job:  [id number]   cc:   Second Floor [facility]   [facility]  [address]  [place], [place]  [address]   Nicki Guadalajara, M.D.  [phone] [address]   [place], [place] [address]  Fax: [phone]   Bryan Lemma. Manus Gunning, M.D.  [address] [place]  [place] [address]  Fax: [phone]

## 2011-03-30 NOTE — Op Note (Signed)
NAME:  Andrew Shaw, Andrew Shaw NO.:  [id number]   MEDICAL RECORD NO.:  [id number]          PATIENT TYPE:  AMB   LOCATION:  [facility]                         FACILITY:  [facility]   PHYSICIAN:  Jene Every, M.D.    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   PROCEDURE PERFORMED:  Left knee arthroscopy, partial medial meniscectomy,  chondroplasty of the patella, medial and lateral femoral condyle, medial and  lateral tibial plateau, evacuation of loose bodies.   ANESTHESIA:  General.   No assistant.   BRIEF HISTORY AND INDICATIONS:  A 65 year old who has had refractory knee  pain, MRI indicating medial meniscus tear.  Despite conservative treatment  with corticosteroid injection, he had persistent pain and swelling.  Operative intervention was indicated for diagnosis and treatment.  Risks and  benefits discussed, including bleeding, infection, damage to vascular  structures, no change in symptoms and worsening symptoms, DVT, anesthetic  complications.   Placed the patient in supine position.  After adequate induction of general  anesthesia, 1 g Kefzol, the left lower extremity was prepped and draped in  the usual sterile fashion.  A lateral parapatellar portal and a superior  medial parapatellar portal was fashioned with a #11 blade.  Ingress cannula  atraumatically placed.  Irrigant was utilized to insufflate the joint.  The  camera was then inserted laterally and under direct visualization, a medial  parapatellar portal was fashioned with a #11 blade after localization with  an 18-gauge needle, sparing the medial meniscus.  Examination of the  patellar pouch revealed loose cartilaginous debris.  There was  a grade 3  change of the patellofemoral articulation.  Normal patellofemoral tracking.  The shaver was introduced and utilized to perform chondroplasty of the  patella.  The gutter was unremarkable, ACL and PCL were unremarkable.  The  lateral compartment revealed some grade 4 changes of the lateral tibial  plateau, degenerative tear in the meniscus, near-absent lateral meniscus.  There was loose cartilaginous debris, and a shaver was introduced and  utilized to perform chondroplasty of the medial femoral condyle and tibial  plateau, shaved the posterior portion of the lateral meniscus.  The medial  compartment revealed grade 3 changes as well, and chondroplasty was  performed, shaving the medial meniscus as well.  After copiously lavaged,  all compartments were re-examined, and again the most significant was the  lateral compartment with extensive grade 4 changes of the posterior third of  the tibial plateau.  Loose cartilaginous debris was fully evacuated.  Next,  all instrumentation was removed.  Portals were closed with 4-0  nylon simple sutures, 0.25% Marcaine with epinephrine was infiltrated in the  joint.  The wound was dressed sterilely.  He was awoken without difficulty  and transported to the recovery room in satisfactory condition.   The patient tolerated the  procedure well with no complication.      JB/MEDQ  D:  02/21/2005  T:  02/21/2005  Job:  [id number]

## 2011-03-30 NOTE — H&P (Signed)
[facility]  Patient:    Andrew Shaw, Andrew Shaw                     MRN: [id number] Adm. Date:  05/10/00 Attending:  Danella Penton CC:         Tanya Nones. Jeral Fruit, M.D.                         History and Physical  FOLLOW-UP EVALUATION  HISTORY OF PRESENT ILLNESS:  Kaidon Kinker comes in with identical symptoms to his last visit.  He has out into his hips bilaterally and down the posterior aspect of his right leg with some numbness and tingling to the anterior aspect.  He had seen Dr. Gerlene Fee earlier this week, and he does not want to operate on him.  He continues on his OxyContin 40 mg three times a day and does not want to take a higher dose nor add any other medications at this time.  The patient is seeking some sort of procedural intervention with regard to his back discomfort, and I advised him that all I had to offer him at this time was further medical management, and he is not interested in trying a lot of other medications.  He asked about getting a third opinion from [place], and I advised him I would not have a problem with this, and we will see if we can get him an appointment with Dr. Manson Passey or Dr. Alvester Morin in [place].  He currently rates his pain as 7/10.  He notes his pain is made worse by standing and walking or going up steps.  PHYSICAL EXAMINATION:  VITAL SIGNS:  Blood pressure 123/75, heart rate 69, respiratory rate 18, O2 saturation 97%.  Pain level is 7/10.  His deep tendon reflexes in the lower extremities were 1 to 2+ and symmetric in the knees bilaterally as well as the ankles.  Straight leg raise signs showed discomfort on the right side.  He has some crepitus on range of motion of his left knee.  IMPRESSION: 1. Chronic low back pain with radiculopathy to the right lower extremity with    a lumbar myelogram showing an L5-S1 small herniated nucleus pulposus    posteriorly encroaching upon the right S1 nerve  root. 2. Knee pain.  Most likely this is ______ . 3. Other medical problems per primary care physician.  PLAN: 1. Continue on OxyContin 40 mg 1 p.o. t.i.d., #90, with no refill. 2. The patient wishes a second opinion from Drs. Manson Passey or Bell in    [place]. 3. Follow up with me in four weeks. DD:  05/10/00 TD:  05/11/00 Job: [id number]

## 2011-03-30 NOTE — Op Note (Signed)
NAME:  Andrew Shaw, Andrew Shaw NO.:  [id number]   MEDICAL RECORD NO.:  [id number]                     PATIENT TYPE:   LOCATION:                                       FACILITY:   PHYSICIAN:  Jene Every, M.D.                 DATE OF BIRTH:   DATE OF PROCEDURE:  03/12/2003  DATE OF DISCHARGE:                                 OPERATIVE REPORT   PREOPERATIVE DIAGNOSES:  1. Medial meniscus tear and degenerative changes of the left knee.  2. 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   POSTOPERATIVE DIAGNOSES:  1. Medial meniscus tear and degenerative changes of the left knee.  2. 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   PROCEDURES:  1. Left knee arthroscopy.  2. Chondroplasty of the patellofemoral sulcus.  3. Partial medial meniscectomy.  4. Debridement.   ANESTHESIA:  General.   BRIEF HISTORY AND INDICATIONS:  A 65 year old with knee pain, MRI indicating  posterior horn medial meniscus tear, and degenerative changes.  Operative  intervention is indicated for irrigation and debridement, partial medial  meniscectomy.  The risks and benefits have been discussed, including  bleeding, instruction, damage to neurovascular structures, no change in  symptoms, worsening symptoms, etc.   DESCRIPTION OF PROCEDURE:  Patient in supine position.  After induction of  adequate general anesthesia, 1 g Kefzol, the left lower extremity prepped  and draped in the usual sterile fashion.  A lateral parapatellar portal and  a superior medial parapatellar portal were fashioned with a #11 blade,  ingress cannula atraumatically placed.  Irrigant was utilized to insufflate  the joint.  A camera inserted via the lateral portal under direct  visualization.  A medial parapatellar portal was fashioned with a #11 blade  after localization with an 18-gauge needle, sparing the medial meniscus.  Inspection of the suprapatellar pouch  revealed extensive grade 3 changes of  the patella.  There was loose cartilaginous debris throughout the knee.  There were minor grade 2 and 3 changes of the femoral sulcus.  There was  normal patellofemoral tracking.  The medial compartment had some  degenerative changes throughout.  There was a tear on the anterior horn and  the posterior horn of the medial meniscus.  A shaver was introduced,  utilized to shave this to a stable base.  There was found hypertrophic  synovium in the intercondylar notch.  This was excised as well.  The lateral  compartment demonstrated some mild degenerative changes, but the lateral  meniscus was stable to probe palpation without evidence of a tear.  The  gutters were examined, and they were unremarkable.  Both menisci were stable  to probe palpation.  Following that there was no unattended residual  pathology.  The ACL showed some mild degenerative changes, the PCL was  unremarkable.   Next the knee was  copiously lavaged, all instrumentation was removed.  The  portals were closed with 4-0 nylon simple sutures.  Marcaine 0.25% with  epinephrine was infiltrated in the joint and the wounds were dressed  sterilely.  The patient was extubated without difficulty, transported to  recovery in satisfactory condition.   The patient tolerated the procedure well with no complications.                                               Jene Every, M.D.    [name]  D:  03/12/2003  T:  03/12/2003  Job:  [id number]

## 2011-08-06 LAB — CBC
MCV: 89.2
Platelets: 240
RDW: 14.4

## 2011-08-06 LAB — BASIC METABOLIC PANEL
GFR calc Af Amer: >60
Glucose, Bld: 110 — ABNORMAL HIGH
Sodium: 137

## 2011-08-17 LAB — CBC
HCT: 45.8
Hemoglobin: 15.8
MCHC: 34.5
MCV: 86.8
RBC: 5.27
RDW: 14.1
WBC: 9.2

## 2011-08-17 LAB — BASIC METABOLIC PANEL
CO2: 22
Creatinine, Ser: 1.05
GFR calc Af Amer: >60
GFR calc non Af Amer: >60
Glucose, Bld: 119 — ABNORMAL HIGH
Sodium: 136

## 2011-08-24 LAB — BASIC METABOLIC PANEL
BUN: 8
CO2: 28
CO2: 29
CO2: 30
Calcium: 8.7
Calcium: 8.9
Chloride: 103
Creatinine, Ser: 1.11
Creatinine, Ser: 1.17
GFR calc Af Amer: >60
GFR calc Af Amer: >60
GFR calc non Af Amer: >60
Potassium: 4
Potassium: 4.5

## 2011-08-24 LAB — CBC
HCT: 33.8 — ABNORMAL LOW
HCT: 37.1 — ABNORMAL LOW
Hemoglobin: 11.9 — ABNORMAL LOW
MCHC: 34.2
Platelets: 185
Platelets: 193
RBC: 3.8 — ABNORMAL LOW
RBC: 4.4
RDW: 13.3
WBC: 12.3 — ABNORMAL HIGH

## 2011-08-24 LAB — PROTIME-INR
INR: 1.1
INR: 2.3 — ABNORMAL HIGH
INR: 2.9 — ABNORMAL HIGH
Prothrombin Time: 26 — ABNORMAL HIGH

## 2011-08-27 LAB — COMPREHENSIVE METABOLIC PANEL
ALT: 29
AST: 21
Albumin: 3.9
BUN: 6
Calcium: 9.7
GFR calc Af Amer: >60
Total Protein: 6.6

## 2011-08-27 LAB — CBC
HCT: 46.5
MCV: 90.3
Platelets: 214

## 2011-11-19 DIAGNOSIS — M47817 Spondylosis without myelopathy or radiculopathy, lumbosacral region: Secondary | ICD-10-CM | POA: Diagnosis not present

## 2011-11-19 DIAGNOSIS — G894 Chronic pain syndrome: Secondary | ICD-10-CM | POA: Diagnosis not present

## 2011-12-17 DIAGNOSIS — M47817 Spondylosis without myelopathy or radiculopathy, lumbosacral region: Secondary | ICD-10-CM | POA: Diagnosis not present

## 2011-12-17 DIAGNOSIS — G894 Chronic pain syndrome: Secondary | ICD-10-CM | POA: Diagnosis not present

## 2011-12-17 DIAGNOSIS — IMO0002 Reserved for concepts with insufficient information to code with codable children: Secondary | ICD-10-CM | POA: Diagnosis not present

## 2011-12-17 DIAGNOSIS — G5600 Carpal tunnel syndrome, unspecified upper limb: Secondary | ICD-10-CM | POA: Diagnosis not present

## 2011-12-19 DIAGNOSIS — R42 Dizziness and giddiness: Secondary | ICD-10-CM | POA: Diagnosis not present

## 2011-12-19 DIAGNOSIS — R5381 Other malaise: Secondary | ICD-10-CM | POA: Diagnosis not present

## 2011-12-27 ENCOUNTER — Other Ambulatory Visit: Payer: Self-pay | Admitting: Family Medicine

## 2011-12-27 DIAGNOSIS — R42 Dizziness and giddiness: Secondary | ICD-10-CM | POA: Diagnosis not present

## 2011-12-27 DIAGNOSIS — R5381 Other malaise: Secondary | ICD-10-CM | POA: Diagnosis not present

## 2011-12-27 DIAGNOSIS — R5383 Other fatigue: Secondary | ICD-10-CM | POA: Diagnosis not present

## 2011-12-27 DIAGNOSIS — R55 Syncope and collapse: Secondary | ICD-10-CM

## 2011-12-28 ENCOUNTER — Other Ambulatory Visit: Payer: Self-pay

## 2011-12-31 ENCOUNTER — Ambulatory Visit
Admission: RE | Admit: 2011-12-31 | Discharge: 2011-12-31 | Disposition: A | Discharge status: Home / Self Care | Payer: Medicare Other | Source: Ambulatory Visit | Attending: Family Medicine | Admitting: Family Medicine

## 2011-12-31 DIAGNOSIS — R42 Dizziness and giddiness: Secondary | ICD-10-CM

## 2011-12-31 DIAGNOSIS — R209 Unspecified disturbances of skin sensation: Secondary | ICD-10-CM | POA: Diagnosis not present

## 2011-12-31 DIAGNOSIS — R55 Syncope and collapse: Secondary | ICD-10-CM | POA: Diagnosis not present

## 2011-12-31 MED ORDER — IOHEXOL 300 MG/ML  SOLN
75.0000 mL | Freq: Once | INTRAMUSCULAR | Status: AC | PRN
  Administered 2011-12-31: 75 mL via INTRAVENOUS

## 2012-01-14 DIAGNOSIS — G894 Chronic pain syndrome: Secondary | ICD-10-CM | POA: Diagnosis not present

## 2012-01-14 DIAGNOSIS — M47817 Spondylosis without myelopathy or radiculopathy, lumbosacral region: Secondary | ICD-10-CM | POA: Diagnosis not present

## 2012-01-21 DIAGNOSIS — I10 Essential (primary) hypertension: Secondary | ICD-10-CM | POA: Diagnosis not present

## 2012-01-21 DIAGNOSIS — I2510 Atherosclerotic heart disease of native coronary artery without angina pectoris: Secondary | ICD-10-CM | POA: Diagnosis not present

## 2012-01-21 DIAGNOSIS — E785 Hyperlipidemia, unspecified: Secondary | ICD-10-CM | POA: Diagnosis not present

## 2012-02-11 DIAGNOSIS — G5600 Carpal tunnel syndrome, unspecified upper limb: Secondary | ICD-10-CM | POA: Diagnosis not present

## 2012-02-11 DIAGNOSIS — G894 Chronic pain syndrome: Secondary | ICD-10-CM | POA: Diagnosis not present

## 2012-02-11 DIAGNOSIS — M47817 Spondylosis without myelopathy or radiculopathy, lumbosacral region: Secondary | ICD-10-CM | POA: Diagnosis not present

## 2012-03-12 DIAGNOSIS — M47817 Spondylosis without myelopathy or radiculopathy, lumbosacral region: Secondary | ICD-10-CM | POA: Diagnosis not present

## 2012-03-12 DIAGNOSIS — G5600 Carpal tunnel syndrome, unspecified upper limb: Secondary | ICD-10-CM | POA: Diagnosis not present

## 2012-03-12 DIAGNOSIS — G894 Chronic pain syndrome: Secondary | ICD-10-CM | POA: Diagnosis not present

## 2012-03-26 DIAGNOSIS — G5600 Carpal tunnel syndrome, unspecified upper limb: Secondary | ICD-10-CM | POA: Diagnosis not present

## 2012-04-03 DIAGNOSIS — G5600 Carpal tunnel syndrome, unspecified upper limb: Secondary | ICD-10-CM | POA: Diagnosis not present

## 2012-04-03 DIAGNOSIS — M19049 Primary osteoarthritis, unspecified hand: Secondary | ICD-10-CM | POA: Diagnosis not present

## 2012-04-03 DIAGNOSIS — M6530 Trigger finger, unspecified finger: Secondary | ICD-10-CM | POA: Diagnosis not present

## 2012-04-10 DIAGNOSIS — G5600 Carpal tunnel syndrome, unspecified upper limb: Secondary | ICD-10-CM | POA: Diagnosis not present

## 2012-04-11 ENCOUNTER — Other Ambulatory Visit: Payer: Self-pay | Admitting: Orthopedic Surgery

## 2012-04-11 DIAGNOSIS — G894 Chronic pain syndrome: Secondary | ICD-10-CM | POA: Diagnosis not present

## 2012-04-11 DIAGNOSIS — G5600 Carpal tunnel syndrome, unspecified upper limb: Secondary | ICD-10-CM | POA: Diagnosis not present

## 2012-04-11 DIAGNOSIS — M47817 Spondylosis without myelopathy or radiculopathy, lumbosacral region: Secondary | ICD-10-CM | POA: Diagnosis not present

## 2012-04-14 ENCOUNTER — Encounter (HOSPITAL_BASED_OUTPATIENT_CLINIC_OR_DEPARTMENT_OTHER): Payer: Self-pay | Admitting: *Deleted

## 2012-04-14 NOTE — Progress Notes (Signed)
To come in early for istat Called dr berry for ekg and notes

## 2012-04-15 ENCOUNTER — Encounter (HOSPITAL_BASED_OUTPATIENT_CLINIC_OR_DEPARTMENT_OTHER): Payer: Self-pay | Admitting: Anesthesiology

## 2012-04-15 ENCOUNTER — Encounter (HOSPITAL_BASED_OUTPATIENT_CLINIC_OR_DEPARTMENT_OTHER): Payer: Self-pay | Admitting: *Deleted

## 2012-04-15 ENCOUNTER — Ambulatory Visit (HOSPITAL_BASED_OUTPATIENT_CLINIC_OR_DEPARTMENT_OTHER): Payer: Medicare Other | Admitting: Anesthesiology

## 2012-04-15 ENCOUNTER — Encounter (HOSPITAL_BASED_OUTPATIENT_CLINIC_OR_DEPARTMENT_OTHER): Payer: Self-pay | Admitting: Orthopedic Surgery

## 2012-04-15 ENCOUNTER — Encounter (HOSPITAL_BASED_OUTPATIENT_CLINIC_OR_DEPARTMENT_OTHER)
Admission: RE | Disposition: A | Discharge status: Home / Self Care | Payer: Self-pay | Source: Ambulatory Visit | Attending: Orthopedic Surgery

## 2012-04-15 ENCOUNTER — Ambulatory Visit (HOSPITAL_BASED_OUTPATIENT_CLINIC_OR_DEPARTMENT_OTHER)
Admission: RE | Admit: 2012-04-15 | Discharge: 2012-04-15 | Disposition: A | Discharge status: Home / Self Care | Payer: Medicare Other | Source: Ambulatory Visit | Attending: Orthopedic Surgery | Admitting: Orthopedic Surgery

## 2012-04-15 DIAGNOSIS — M65839 Other synovitis and tenosynovitis, unspecified forearm: Secondary | ICD-10-CM | POA: Diagnosis not present

## 2012-04-15 DIAGNOSIS — F17200 Nicotine dependence, unspecified, uncomplicated: Secondary | ICD-10-CM | POA: Insufficient documentation

## 2012-04-15 DIAGNOSIS — G5600 Carpal tunnel syndrome, unspecified upper limb: Secondary | ICD-10-CM | POA: Insufficient documentation

## 2012-04-15 DIAGNOSIS — I10 Essential (primary) hypertension: Secondary | ICD-10-CM | POA: Diagnosis not present

## 2012-04-15 DIAGNOSIS — M549 Dorsalgia, unspecified: Secondary | ICD-10-CM | POA: Insufficient documentation

## 2012-04-15 DIAGNOSIS — I2510 Atherosclerotic heart disease of native coronary artery without angina pectoris: Secondary | ICD-10-CM | POA: Insufficient documentation

## 2012-04-15 DIAGNOSIS — M6530 Trigger finger, unspecified finger: Secondary | ICD-10-CM | POA: Diagnosis not present

## 2012-04-15 DIAGNOSIS — K219 Gastro-esophageal reflux disease without esophagitis: Secondary | ICD-10-CM | POA: Insufficient documentation

## 2012-04-15 DIAGNOSIS — M65849 Other synovitis and tenosynovitis, unspecified hand: Secondary | ICD-10-CM | POA: Diagnosis not present

## 2012-04-15 HISTORY — DX: Shortness of breath: R06.02

## 2012-04-15 HISTORY — DX: Unspecified osteoarthritis, unspecified site: M19.90

## 2012-04-15 HISTORY — PX: CARPAL TUNNEL RELEASE: SHX101

## 2012-04-15 HISTORY — DX: Hyperlipidemia, unspecified: E78.5

## 2012-04-15 HISTORY — DX: Dorsalgia, unspecified: M54.9

## 2012-04-15 HISTORY — DX: Myoneural disorder, unspecified: G70.9

## 2012-04-15 HISTORY — DX: Benign prostatic hyperplasia without lower urinary tract symptoms: N40.0

## 2012-04-15 HISTORY — DX: Polyneuropathy, unspecified: G62.9

## 2012-04-15 HISTORY — DX: Acute embolism and thrombosis of unspecified deep veins of unspecified lower extremity: I82.409

## 2012-04-15 HISTORY — DX: Carpal tunnel syndrome, unspecified upper limb: G56.00

## 2012-04-15 HISTORY — DX: Gastro-esophageal reflux disease without esophagitis: K21.9

## 2012-04-15 HISTORY — DX: Acute myocardial infarction, unspecified: I21.9

## 2012-04-15 HISTORY — PX: TRIGGER FINGER RELEASE: SHX641

## 2012-04-15 HISTORY — DX: Atherosclerotic heart disease of native coronary artery without angina pectoris: I25.10

## 2012-04-15 HISTORY — DX: Other chronic pain: G89.29

## 2012-04-15 HISTORY — DX: Essential (primary) hypertension: I10

## 2012-04-15 LAB — POCT I-STAT, CHEM 8
Chloride: 109 mEq/L (ref 96–112)
Creatinine, Ser: 1.2 mg/dL (ref 0.50–1.35)
Glucose, Bld: 109 mg/dL — ABNORMAL HIGH (ref 70–99)
Hemoglobin: 17 g/dL (ref 13.0–17.0)
Potassium: 4.1 mEq/L (ref 3.5–5.1)

## 2012-04-15 SURGERY — CARPAL TUNNEL RELEASE
Anesthesia: Monitor Anesthesia Care | Site: Wrist | Laterality: Left | Wound class: Clean

## 2012-04-15 MED ORDER — FENTANYL CITRATE 0.05 MG/ML IJ SOLN
INTRAMUSCULAR | Status: DC | PRN
  Administered 2012-04-15: 100 ug via INTRAVENOUS

## 2012-04-15 MED ORDER — ONDANSETRON HCL 4 MG/2ML IJ SOLN
INTRAMUSCULAR | Status: DC | PRN
  Administered 2012-04-15: 4 mg via INTRAVENOUS

## 2012-04-15 MED ORDER — PROPOFOL 10 MG/ML IV BOLUS
INTRAVENOUS | Status: DC | PRN
  Administered 2012-04-15: 40 mg via INTRAVENOUS

## 2012-04-15 MED ORDER — PROMETHAZINE HCL 25 MG/ML IJ SOLN: 6.2500 mg | INTRAMUSCULAR | Status: DC | PRN

## 2012-04-15 MED ORDER — LACTATED RINGERS IV SOLN
INTRAVENOUS | Status: DC
  Administered 2012-04-15: 10:00:00 via INTRAVENOUS

## 2012-04-15 MED ORDER — PROPOFOL 10 MG/ML IV EMUL
INTRAVENOUS | Status: DC | PRN
  Administered 2012-04-15: 50 ug/kg/min via INTRAVENOUS

## 2012-04-15 MED ORDER — BUPIVACAINE HCL (PF) 0.25 % IJ SOLN
INTRAMUSCULAR | Status: DC | PRN
  Administered 2012-04-15: 10 mL

## 2012-04-15 MED ORDER — HYDROCODONE-ACETAMINOPHEN 5-325 MG PO TABS
1.0000 | ORAL_TABLET | Freq: Once | ORAL | Status: AC
  Administered 2012-04-15: 1 via ORAL

## 2012-04-15 MED ORDER — MIDAZOLAM HCL 5 MG/5ML IJ SOLN
INTRAMUSCULAR | Status: DC | PRN
  Administered 2012-04-15: 2 mg via INTRAVENOUS

## 2012-04-15 MED ORDER — CHLORHEXIDINE GLUCONATE 4 % EX LIQD: 60.0000 mL | Freq: Once | CUTANEOUS | Status: DC

## 2012-04-15 MED ORDER — CEFAZOLIN SODIUM 1-5 GM-% IV SOLN
INTRAVENOUS | Status: DC | PRN
  Administered 2012-04-15: 2 g via INTRAVENOUS

## 2012-04-15 MED ORDER — MEPERIDINE HCL 25 MG/ML IJ SOLN: 6.2500 mg | INTRAMUSCULAR | Status: DC | PRN

## 2012-04-15 MED ORDER — MIDAZOLAM HCL 2 MG/2ML IJ SOLN: 0.5000 mg | Freq: Once | INTRAMUSCULAR | Status: DC | PRN

## 2012-04-15 MED ORDER — FENTANYL CITRATE 0.05 MG/ML IJ SOLN: 25.0000 ug | INTRAMUSCULAR | Status: DC | PRN

## 2012-04-15 SURGICAL SUPPLY — 41 items
BANDAGE COBAN STERILE 2 (GAUZE/BANDAGES/DRESSINGS) ×3 IMPLANT
BANDAGE GAUZE ELAST BULKY 4 IN (GAUZE/BANDAGES/DRESSINGS) ×3 IMPLANT
BLADE SURG 15 STRL LF DISP TIS (BLADE) ×2 IMPLANT
BLADE SURG 15 STRL SS (BLADE) ×1
BNDG COHESIVE 3X5 TAN STRL LF (GAUZE/BANDAGES/DRESSINGS) ×3 IMPLANT
BNDG ESMARK 4X9 LF (GAUZE/BANDAGES/DRESSINGS) IMPLANT
CHLORAPREP W/TINT 26ML (MISCELLANEOUS) ×3 IMPLANT
CLOTH BEACON ORANGE TIMEOUT ST (SAFETY) ×3 IMPLANT
CORDS BIPOLAR (ELECTRODE) ×3 IMPLANT
COVER MAYO STAND STRL (DRAPES) ×3 IMPLANT
COVER TABLE BACK 60X90 (DRAPES) ×3 IMPLANT
CUFF TOURNIQUET SINGLE 18IN (TOURNIQUET CUFF) ×3 IMPLANT
DECANTER SPIKE VIAL GLASS SM (MISCELLANEOUS) IMPLANT
DRAPE EXTREMITY T 121X128X90 (DRAPE) ×3 IMPLANT
DRAPE SURG 17X23 STRL (DRAPES) ×3 IMPLANT
DRSG KUZMA FLUFF (GAUZE/BANDAGES/DRESSINGS) ×3 IMPLANT
GAUZE XEROFORM 1X8 LF (GAUZE/BANDAGES/DRESSINGS) ×3 IMPLANT
GLOVE BIO SURGEON STRL SZ 6.5 (GLOVE) IMPLANT
GLOVE BIO SURGEON STRL SZ7 (GLOVE) ×3 IMPLANT
GLOVE BIO SURGEON STRL SZ7.5 (GLOVE) ×3 IMPLANT
GLOVE BIOGEL PI IND STRL 7.5 (GLOVE) ×2 IMPLANT
GLOVE BIOGEL PI INDICATOR 7.5 (GLOVE) ×1
GLOVE SURG ORTHO 8.0 STRL STRW (GLOVE) ×3 IMPLANT
GOWN BRE IMP PREV XXLGXLNG (GOWN DISPOSABLE) ×6 IMPLANT
GOWN PREVENTION PLUS XLARGE (GOWN DISPOSABLE) ×6 IMPLANT
NEEDLE 27GAX1X1/2 (NEEDLE) ×3 IMPLANT
NS IRRIG 1000ML POUR BTL (IV SOLUTION) ×3 IMPLANT
PACK BASIN DAY SURGERY FS (CUSTOM PROCEDURE TRAY) ×3 IMPLANT
PAD CAST 3X4 CTTN HI CHSV (CAST SUPPLIES) ×2 IMPLANT
PADDING CAST ABS 4INX4YD NS (CAST SUPPLIES) ×1
PADDING CAST ABS COTTON 4X4 ST (CAST SUPPLIES) ×2 IMPLANT
PADDING CAST COTTON 3X4 STRL (CAST SUPPLIES) ×1
SPONGE GAUZE 4X4 12PLY (GAUZE/BANDAGES/DRESSINGS) ×3 IMPLANT
STOCKINETTE 4X48 STRL (DRAPES) ×3 IMPLANT
SUT VICRYL 4-0 PS2 18IN ABS (SUTURE) IMPLANT
SUT VICRYL RAPIDE 4/0 PS 2 (SUTURE) ×3 IMPLANT
SYR BULB 3OZ (MISCELLANEOUS) ×3 IMPLANT
SYR CONTROL 10ML LL (SYRINGE) ×3 IMPLANT
TOWEL OR 17X24 6PK STRL BLUE (TOWEL DISPOSABLE) ×6 IMPLANT
UNDERPAD 30X30 INCONTINENT (UNDERPADS AND DIAPERS) ×3 IMPLANT
WATER STERILE IRR 1000ML POUR (IV SOLUTION) IMPLANT

## 2012-04-15 NOTE — Discharge Instructions (Addendum)

## 2012-04-15 NOTE — Op Note (Signed)
Dictated number:  

## 2012-04-15 NOTE — Transfer of Care (Signed)
Immediate Anesthesia Transfer of Care Note  Patient: Andrew Shaw  Procedure(s) Performed: Procedure(s) (LRB): CARPAL TUNNEL RELEASE (Left) RELEASE TRIGGER FINGER/A-1 PULLEY (Left)  Patient Location: PACU  Anesthesia Type: Regional  Level of Consciousness: awake, alert  and oriented  Airway & Oxygen Therapy: Patient Spontanous Breathing and Patient connected to face mask oxygen  Post-op Assessment: Report given to PACU RN, Post -op Vital signs reviewed and stable and Patient moving all extremities  Post vital signs: Reviewed and stable  Complications: No apparent anesthesia complications

## 2012-04-15 NOTE — Anesthesia Preprocedure Evaluation (Signed)
Anesthesia Evaluation  Patient identified by MRN, date of birth, ID band Patient awake    Reviewed: Allergy & Precautions, H&P , NPO status , Patient's Chart, lab work & pertinent test results, reviewed documented beta blocker date and time   History of Anesthesia Complications Negative for: history of anesthetic complications  Airway Mallampati: II TM Distance: >3 FB Neck ROM: Full    Dental  (+) Edentulous Upper, Partial Lower and Dental Advisory Given   Pulmonary Current Smoker,  breath sounds clear to auscultation  Pulmonary exam normal       Cardiovascular hypertension, Pt. on medications and Pt. on home beta blockers + CAD (cath '05: non-obstructive ASCADz, normal LVF) - Past MI (ruled out MI '05) Rhythm:Regular Rate:Normal  '11 stress test: no ischemia, EF 62% '11 ECHO: normal LVF, normal valves, EF>55% '05 cath: moderate  ASCADz   Neuro/Psych  Neuromuscular disease (chronic back pain: narcotics daily) negative neurological ROS     GI/Hepatic Neg liver ROS, GERD-  Medicated and Controlled,  Endo/Other    Renal/GU negative Renal ROS     Musculoskeletal   Abdominal (+) + obese,   Peds  Hematology   Anesthesia Other Findings   Reproductive/Obstetrics                           Anesthesia Physical Anesthesia Plan  ASA: III  Anesthesia Plan: MAC and Bier Block   Post-op Pain Management:    Induction:   Airway Management Planned: Simple Face Mask  Additional Equipment:   Intra-op Plan:   Post-operative Plan:   Informed Consent: I have reviewed the patients History and Physical, chart, labs and discussed the procedure including the risks, benefits and alternatives for the proposed anesthesia with the patient or authorized representative who has indicated his/her understanding and acceptance.   Dental advisory given  Plan Discussed with: CRNA and Surgeon  Anesthesia Plan  Comments: (Plan routine monitors, IV Regional Lidocaine)        Anesthesia Quick Evaluation

## 2012-04-15 NOTE — Anesthesia Postprocedure Evaluation (Signed)
  Anesthesia Post-op Note  Patient: Andrew Shaw  Procedure(s) Performed: Procedure(s) (LRB): CARPAL TUNNEL RELEASE (Left) RELEASE TRIGGER FINGER/A-1 PULLEY (Left)  Patient Location: PACU  Anesthesia Type: Bier block  Level of Consciousness: awake, alert  and oriented  Airway and Oxygen Therapy: Patient Spontanous Breathing  Post-op Pain: none  Post-op Assessment: Post-op Vital signs reviewed, Patient's Cardiovascular Status Stable, Respiratory Function Stable, Patent Airway, No signs of Nausea or vomiting and Pain level controlled  Post-op Vital Signs: Reviewed and stable  Complications: No apparent anesthesia complications

## 2012-04-15 NOTE — H&P (Signed)
Andrew Shaw is a 66 year old left hand dominant male who is referred by his wife. He is complaining of pain in the hands bilaterally with numbness and tingling to the fingers, left equal to right. He is also complaining of catching of his little fingers bilaterally and left thumb. This has been going on for at least 5 months. He has some discomfort at the [facility] joint of his thumb with pinching and gripping. He is awakened 7 out of 7 nights with a severe throbbing numbness. He states it is gradually getting worse. Activity makes it worse. He has been wearing braces. He has a history of a fusion of his neck by Dr. Jeral Fruit 20 years ago. He had an injury to his left hand 26 years ago. He has no history of diabetes. He has a history of arthritis. He has no history of thyroid problems or gout. There is a family history of diabetes. He has been tested. He is taking OxyContin for his back. He has been treated by Dr. Vear Clock. He is allergic to steroids.   Past Medical History: He is on Metoprolol, TriCor, Crestor, aspirin, OxyContin. He has had knee replacements times 2, low back surgery and neck surgery. He is allergic to all steroids.   Family Medical History: Positive for high BP and arthritis.  Social History: He smokes 1 PPD and is advised to quit and the reasons behind this. He does not drink. He has not been working for 8 years.   Review of Systems: Positive for glasses, high BP, shortness of breath, skin ulcers, sleep disorder, easy bleeding and easy bruising, otherwise negative for 14 points. Andrew Shaw is an 66 y.o. male.   Chief Complaint: CTS Lt STS LT thumb and little HPI: see above  Past Medical History  Diagnosis Date  . Coronary artery disease   . Hypertension   . Hyperlipemia   . Shortness of breath   . Chronic back pain   . DVT (deep venous thrombosis)     history 2004 after knee surg  . BPH (benign prostatic hyperplasia)   . Arthritis   . Neuromuscular disorder   . Carpal  tunnel syndrome   . 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 GERD (gastroesophageal reflux disease)   . 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 Knee arthroscopy 04,06    left  . Joint replacement 2008    lt total knee  . Manipulation knee joint 2009    closed lt knee   . Hernia repair 2008    umb   . Cardiac catheterization 2005,96  . Cervical fusion 1999  . Back surgery 2004    lumb fusion  . Epidural block injection     multiple lumbar    No family history on file. Social History:  reports that he has been smoking.  He does not have any smokeless tobacco history on file. He reports that he does not drink alcohol or use illicit drugs.  Allergies:  Allergies  Allergen Reactions  . Antihistamines, Chlorpheniramine-Type Itching  . Cortisone     MI  . Flexeril (Cyclobenzaprine) Itching    No prescriptions prior to admission    No results found for this or any previous visit (from the past 48 hour(s)).  No results found.   Pertinent items are noted in HPI.  Height 5\' 6"  (1.676 m), weight 83.915 kg (185 lb).  General  appearance: alert, cooperative and appears stated age Head: Normocephalic, without obvious abnormality Neck: no adenopathy Resp: clear to auscultation bilaterally Cardio: regular rate and rhythm, S1, S2 normal, no murmur, click, rub or gallop GI: soft, non-tender; bowel sounds normal; no masses,  no organomegaly Extremities: extremities normal, atraumatic, no cyanosis or edema Pulses: 2+ and symmetric Skin: Skin color, texture, turgor normal. No rashes or lesions Neurologic: Grossly normal Incision/Wound: na  Assessment/Plan  Dx: carpal tunnel syndrome, CMC arthritis and STS left thumb left hand.   He had his nerve conductions done revealing bilateral carpal tunnel syndrome with motor and sensory delays. He continues to have the triggering of the thumb and little left side, little and index on  the right. He is not able to take steroid injections. We have discussed with him the possibility of surgical decompression of the median nerve, release of the A-1 pulleys of the thumb and little finger. He desires having this done. He is aware there is no guarantee with surgery, possibility of infection, recurrence, injury to arteries, nerves and tendons, incomplete relief of symptoms and dystrophy.  He is scheduled as an outpatient for release A-1 pulley left thumb, left little finger and carpal tunnel release left hand.  Rozalynn Buege R 04/15/2012, 5:25 AM

## 2012-04-15 NOTE — Brief Op Note (Signed)
04/15/2012  12:07 PM  PATIENT:  Andrew Shaw  66 y.o. 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  PROCEDURE:  Procedure(s) (LRB): CARPAL TUNNEL RELEASE (Left) RELEASE TRIGGER FINGER/A-1 PULLEY (Left)  SURGEON:  Surgeon(s) and Role:    * Nicki Reaper, MD - Primary    * Tami Ribas, MD - Assisting  PHYSICIAN ASSISTANT:   ASSISTANTS: K Eknoor Novack,MD   ANESTHESIA:   local and regional  EBL:  Total I/O In: 700 [I.V.:700] Out: -   BLOOD ADMINISTERED:none  DRAINS: none   LOCAL MEDICATIONS USED:  MARCAINE     SPECIMEN:  No Specimen  DISPOSITION OF SPECIMEN:  N/A  COUNTS:  YES  TOURNIQUET:   Total Tourniquet Time Documented: Forearm (Left) - 28 minutes  DICTATION: .Other Dictation: Dictation Number [id number]  PLAN OF CARE: Discharge to home after PACU  PATIENT DISPOSITION:  PACU - hemodynamically stable.

## 2012-04-16 NOTE — Op Note (Addendum)
NAME:  TOR, TSUDA NO.:  [id number]  MEDICAL RECORD NO.:  [id number]  LOCATION:                                 FACILITY:  PHYSICIAN:  Cindee Salt, M.D.            DATE OF BIRTH:  DATE OF PROCEDURE:  04/15/2012 DATE OF DISCHARGE:                              OPERATIVE REPORT   PREOPERATIVE DIAGNOSES:  Carpal tunnel syndrome, left hand; stenosing tenosynovitis, left thumb and left little finger.  POSTOPERATIVE DIAGNOSES:  Carpal tunnel syndrome, left hand; stenosing tenosynovitis, left thumb and left little finger.  OPERATION:  Release of carpal tunnel, release of stenosing tenosynovitis, left thumb and left little finger with mistake in incision on left index finger  without release.  SURGEON:  Cindee Salt, M.D.  ASSISTANT:  Betha Loa, MD  ANESTHESIA:  IV regional with local infiltration.  ANESTHESIOLOGIST:  Dr. Jean Rosenthal.  HISTORY:  The patient is a 66 year old male with a history of carpal tunnel syndrome, triggering of the thumb and little fingers of his left hand.  He has elected to undergo surgical decompression.  Pre, peri, postoperative course had been discussed along with risks and complications.  He is aware that there is no guarantee with the surgery, possibility of infection, recurrence, injury to arteries, nerves, tendons, incomplete relief of symptoms, dystrophy.  Preoperative area, the patient is seen, the extremity marked by both the patient and surgeon.  Antibiotic given.  PROCEDURE:  The patient was brought to the operating room where a forearm-based IV regional anesthetic was carried out without difficulty. A 3 minutes dry time was allowed.  Time-out taken, confirming the patient and procedure.  A longitudinal incision was made in the palm, carried down through subcutaneous tissue.  Bleeders were electrocauterized.  Palmar fascia was split.  Superficial palmar arch identified.  The flexor tendon to the ring and little finger  identified. To the ulnar side of median nerve, the carpal retinaculum was incised with sharp dissection.  Right angle and Sewall retractor were placed between skin and forearm fascia.  The fascia released for approximately a centimeter and half proximal to the wrist crease under direct vision. Canal was explored.  Air compression of the nerve was apparent.  No further lesions were identified.  The wound was irrigated.  Skin closed with interrupted 4-0 Vicryl Rapide sutures.  A separate incision was then made over the little finger, carried down through the subcutaneous tissue.  A incision was made over the index finger carried down through subcutaneous tissue.  At this point, it was realized that the index finger was not involved, it was the thumb that was involved.  A separate incision was then made transversely over the thumb carried down through subcutaneous tissue.  The A1 pulley was preserved on its radial aspect. Neurovascular structures protected, the release was performed on the radial aspect protecting the oblique pulley.  The thumb placed through a full range motion, no further triggering was encountered.  The little finger was then attended to.  Next, the A1 pulley was released on its radial aspect.  A small incision made centrally in A2.  Finger placed through a full range motion, no  further triggering was identified.  The wounds were irrigated, and the skin closed with interrupted 4-0 Vicryl Rapide sutures.  Sterile compressive dressing was applied.  On deflation of the tourniquet, all fingers immediately pinked.  He was taken to the recovery room for observation in satisfactory condition.          ______________________________ Cindee Salt, M.D.     GK/MEDQ  D:  04/15/2012  T:  04/15/2012  Job:  [id number]

## 2012-04-17 ENCOUNTER — Encounter (HOSPITAL_BASED_OUTPATIENT_CLINIC_OR_DEPARTMENT_OTHER): Payer: Self-pay | Admitting: Orthopedic Surgery

## 2012-05-12 DIAGNOSIS — M47817 Spondylosis without myelopathy or radiculopathy, lumbosacral region: Secondary | ICD-10-CM | POA: Diagnosis not present

## 2012-05-12 DIAGNOSIS — G894 Chronic pain syndrome: Secondary | ICD-10-CM | POA: Diagnosis not present

## 2012-06-12 DIAGNOSIS — L570 Actinic keratosis: Secondary | ICD-10-CM | POA: Diagnosis not present

## 2012-06-12 DIAGNOSIS — D485 Neoplasm of uncertain behavior of skin: Secondary | ICD-10-CM | POA: Diagnosis not present

## 2012-06-23 DIAGNOSIS — M47817 Spondylosis without myelopathy or radiculopathy, lumbosacral region: Secondary | ICD-10-CM | POA: Diagnosis not present

## 2012-06-23 DIAGNOSIS — IMO0002 Reserved for concepts with insufficient information to code with codable children: Secondary | ICD-10-CM | POA: Diagnosis not present

## 2012-06-23 DIAGNOSIS — G894 Chronic pain syndrome: Secondary | ICD-10-CM | POA: Diagnosis not present

## 2012-06-24 ENCOUNTER — Other Ambulatory Visit: Payer: Self-pay | Admitting: Anesthesiology

## 2012-06-24 DIAGNOSIS — M545 Low back pain: Secondary | ICD-10-CM

## 2012-06-30 ENCOUNTER — Ambulatory Visit
Admission: RE | Admit: 2012-06-30 | Discharge: 2012-06-30 | Disposition: A | Discharge status: Home / Self Care | Payer: Medicare Other | Source: Ambulatory Visit | Attending: Anesthesiology | Admitting: Anesthesiology

## 2012-06-30 DIAGNOSIS — M5126 Other intervertebral disc displacement, lumbar region: Secondary | ICD-10-CM | POA: Diagnosis not present

## 2012-06-30 DIAGNOSIS — R209 Unspecified disturbances of skin sensation: Secondary | ICD-10-CM | POA: Diagnosis not present

## 2012-06-30 DIAGNOSIS — M545 Low back pain: Secondary | ICD-10-CM

## 2012-06-30 MED ORDER — GADOBENATE DIMEGLUMINE 529 MG/ML IV SOLN
17.0000 mL | Freq: Once | INTRAVENOUS | Status: AC | PRN
  Administered 2012-06-30: 17 mL via INTRAVENOUS

## 2012-07-21 DIAGNOSIS — M47817 Spondylosis without myelopathy or radiculopathy, lumbosacral region: Secondary | ICD-10-CM | POA: Diagnosis not present

## 2012-07-21 DIAGNOSIS — IMO0002 Reserved for concepts with insufficient information to code with codable children: Secondary | ICD-10-CM | POA: Diagnosis not present

## 2012-07-21 DIAGNOSIS — G894 Chronic pain syndrome: Secondary | ICD-10-CM | POA: Diagnosis not present

## 2012-08-14 DIAGNOSIS — I2510 Atherosclerotic heart disease of native coronary artery without angina pectoris: Secondary | ICD-10-CM | POA: Diagnosis not present

## 2012-08-14 DIAGNOSIS — I10 Essential (primary) hypertension: Secondary | ICD-10-CM | POA: Diagnosis not present

## 2012-08-14 DIAGNOSIS — E782 Mixed hyperlipidemia: Secondary | ICD-10-CM | POA: Diagnosis not present

## 2012-08-25 DIAGNOSIS — M47817 Spondylosis without myelopathy or radiculopathy, lumbosacral region: Secondary | ICD-10-CM | POA: Diagnosis not present

## 2012-08-25 DIAGNOSIS — G894 Chronic pain syndrome: Secondary | ICD-10-CM | POA: Diagnosis not present

## 2012-09-22 DIAGNOSIS — M47817 Spondylosis without myelopathy or radiculopathy, lumbosacral region: Secondary | ICD-10-CM | POA: Diagnosis not present

## 2012-09-22 DIAGNOSIS — G894 Chronic pain syndrome: Secondary | ICD-10-CM | POA: Diagnosis not present

## 2012-10-27 DIAGNOSIS — IMO0002 Reserved for concepts with insufficient information to code with codable children: Secondary | ICD-10-CM | POA: Diagnosis not present

## 2012-10-27 DIAGNOSIS — G894 Chronic pain syndrome: Secondary | ICD-10-CM | POA: Diagnosis not present

## 2012-10-27 DIAGNOSIS — M47817 Spondylosis without myelopathy or radiculopathy, lumbosacral region: Secondary | ICD-10-CM | POA: Diagnosis not present

## 2012-12-22 ENCOUNTER — Emergency Department (HOSPITAL_COMMUNITY): Payer: Medicare Other

## 2012-12-22 ENCOUNTER — Other Ambulatory Visit: Payer: Self-pay

## 2012-12-22 ENCOUNTER — Inpatient Hospital Stay (HOSPITAL_COMMUNITY): Payer: Medicare Other

## 2012-12-22 ENCOUNTER — Encounter (HOSPITAL_COMMUNITY): Payer: Self-pay | Admitting: Emergency Medicine

## 2012-12-22 ENCOUNTER — Inpatient Hospital Stay (HOSPITAL_COMMUNITY)
Admission: EM | Admit: 2012-12-22 | Discharge: 2012-12-24 | DRG: 249 | Disposition: A | Discharge status: Home / Self Care | Payer: Medicare Other | Attending: Cardiovascular Disease | Admitting: Cardiovascular Disease

## 2012-12-22 DIAGNOSIS — R109 Unspecified abdominal pain: Secondary | ICD-10-CM | POA: Diagnosis not present

## 2012-12-22 DIAGNOSIS — F17200 Nicotine dependence, unspecified, uncomplicated: Secondary | ICD-10-CM | POA: Diagnosis present

## 2012-12-22 DIAGNOSIS — E782 Mixed hyperlipidemia: Secondary | ICD-10-CM | POA: Diagnosis not present

## 2012-12-22 DIAGNOSIS — M549 Dorsalgia, unspecified: Secondary | ICD-10-CM | POA: Diagnosis present

## 2012-12-22 DIAGNOSIS — Z9861 Coronary angioplasty status: Secondary | ICD-10-CM | POA: Diagnosis not present

## 2012-12-22 DIAGNOSIS — Z720 Tobacco use: Secondary | ICD-10-CM | POA: Diagnosis present

## 2012-12-22 DIAGNOSIS — Z95820 Peripheral vascular angioplasty status with implants and grafts: Secondary | ICD-10-CM

## 2012-12-22 DIAGNOSIS — R079 Chest pain, unspecified: Secondary | ICD-10-CM | POA: Diagnosis not present

## 2012-12-22 DIAGNOSIS — I252 Old myocardial infarction: Secondary | ICD-10-CM | POA: Diagnosis not present

## 2012-12-22 DIAGNOSIS — N400 Enlarged prostate without lower urinary tract symptoms: Secondary | ICD-10-CM | POA: Diagnosis present

## 2012-12-22 DIAGNOSIS — I200 Unstable angina: Secondary | ICD-10-CM | POA: Diagnosis present

## 2012-12-22 DIAGNOSIS — E785 Hyperlipidemia, unspecified: Secondary | ICD-10-CM | POA: Diagnosis not present

## 2012-12-22 DIAGNOSIS — K219 Gastro-esophageal reflux disease without esophagitis: Secondary | ICD-10-CM | POA: Diagnosis present

## 2012-12-22 DIAGNOSIS — Z79899 Other long term (current) drug therapy: Secondary | ICD-10-CM | POA: Diagnosis not present

## 2012-12-22 DIAGNOSIS — Z955 Presence of coronary angioplasty implant and graft: Secondary | ICD-10-CM

## 2012-12-22 DIAGNOSIS — I2510 Atherosclerotic heart disease of native coronary artery without angina pectoris: Principal | ICD-10-CM | POA: Diagnosis present

## 2012-12-22 DIAGNOSIS — I10 Essential (primary) hypertension: Secondary | ICD-10-CM | POA: Diagnosis present

## 2012-12-22 HISTORY — DX: Dorsalgia, unspecified: M54.9

## 2012-12-22 HISTORY — DX: Peripheral vascular angioplasty status with implants and grafts: Z95.820

## 2012-12-22 LAB — CBC WITH DIFFERENTIAL/PLATELET
Basophils Relative: 0 % (ref 0–1)
Eosinophils Absolute: 0.3 10*3/uL (ref 0.0–0.7)
HCT: 49.7 % (ref 39.0–52.0)
Hemoglobin: 17.5 g/dL — ABNORMAL HIGH (ref 13.0–17.0)
MCH: 31.7 pg (ref 26.0–34.0)
MCHC: 35.2 g/dL (ref 30.0–36.0)
Monocytes Absolute: 0.8 10*3/uL (ref 0.1–1.0)
Monocytes Relative: 11 % (ref 3–12)

## 2012-12-22 LAB — COMPREHENSIVE METABOLIC PANEL
Albumin: 3.9 g/dL (ref 3.5–5.2)
BUN: 9 mg/dL (ref 6–23)
Calcium: 9.7 mg/dL (ref 8.4–10.5)
Creatinine, Ser: 1.19 mg/dL (ref 0.50–1.35)
Total Bilirubin: 0.4 mg/dL (ref 0.3–1.2)
Total Protein: 7.3 g/dL (ref 6.0–8.3)

## 2012-12-22 LAB — POCT I-STAT TROPONIN I: Troponin i, poc: 0 ng/mL (ref 0.00–0.08)

## 2012-12-22 LAB — TROPONIN I: Troponin I: <0.3 ng/mL (ref <0.30)

## 2012-12-22 MED ORDER — ONDANSETRON HCL 4 MG/2ML IJ SOLN: 4.0000 mg | Freq: Four times a day (QID) | INTRAMUSCULAR | Status: DC | PRN

## 2012-12-22 MED ORDER — FENOFIBRATE 160 MG PO TABS
160.0000 mg | ORAL_TABLET | Freq: Every day | ORAL | Status: DC
  Administered 2012-12-23 – 2012-12-24 (×2): 160 mg via ORAL
  Filled 2012-12-22 (×2): qty 1

## 2012-12-22 MED ORDER — SODIUM CHLORIDE 0.9 % IV SOLN: 250.0000 mL | INTRAVENOUS | Status: DC | PRN

## 2012-12-22 MED ORDER — HEPARIN (PORCINE) IN NACL 100-0.45 UNIT/ML-% IJ SOLN
1350.0000 [IU]/h | INTRAMUSCULAR | Status: DC
  Administered 2012-12-22: 1150 [IU]/h via INTRAVENOUS
  Administered 2012-12-23: 1350 [IU]/h via INTRAVENOUS
  Filled 2012-12-22 (×3): qty 250

## 2012-12-22 MED ORDER — ZOLPIDEM TARTRATE 5 MG PO TABS: 5.0000 mg | ORAL_TABLET | Freq: Every evening | ORAL | Status: DC | PRN

## 2012-12-22 MED ORDER — ALPRAZOLAM 0.25 MG PO TABS: 0.2500 mg | ORAL_TABLET | Freq: Three times a day (TID) | ORAL | Status: DC | PRN

## 2012-12-22 MED ORDER — HYDROCODONE-ACETAMINOPHEN 5-325 MG PO TABS
1.0000 | ORAL_TABLET | ORAL | Status: DC | PRN
  Administered 2012-12-22 – 2012-12-23 (×6): 1 via ORAL
  Filled 2012-12-22 (×6): qty 1

## 2012-12-22 MED ORDER — SODIUM CHLORIDE 0.9 % IJ SOLN
3.0000 mL | Freq: Two times a day (BID) | INTRAMUSCULAR | Status: DC
  Administered 2012-12-22: 3 mL via INTRAVENOUS
  Administered 2012-12-23: 10 mL via INTRAVENOUS

## 2012-12-22 MED ORDER — METOPROLOL TARTRATE 50 MG PO TABS
50.0000 mg | ORAL_TABLET | Freq: Two times a day (BID) | ORAL | Status: DC
  Administered 2012-12-22 – 2012-12-24 (×4): 50 mg via ORAL
  Filled 2012-12-22 (×5): qty 1

## 2012-12-22 MED ORDER — TRAMADOL HCL 50 MG PO TABS
50.0000 mg | ORAL_TABLET | Freq: Four times a day (QID) | ORAL | Status: DC | PRN
  Filled 2012-12-22: qty 1

## 2012-12-22 MED ORDER — ATORVASTATIN CALCIUM 40 MG PO TABS
40.0000 mg | ORAL_TABLET | Freq: Every day | ORAL | Status: DC
  Administered 2012-12-23: 40 mg via ORAL
  Filled 2012-12-22 (×2): qty 1

## 2012-12-22 MED ORDER — NITROGLYCERIN 0.4 MG SL SUBL: 0.4000 mg | SUBLINGUAL_TABLET | SUBLINGUAL | Status: DC | PRN

## 2012-12-22 MED ORDER — ACETAMINOPHEN 325 MG PO TABS: 650.0000 mg | ORAL_TABLET | ORAL | Status: DC | PRN

## 2012-12-22 MED ORDER — SODIUM CHLORIDE 0.9 % IJ SOLN: 3.0000 mL | INTRAMUSCULAR | Status: DC | PRN

## 2012-12-22 MED ORDER — ASPIRIN 81 MG PO CHEW
324.0000 mg | CHEWABLE_TABLET | ORAL | Status: AC
  Administered 2012-12-23: 324 mg via ORAL
  Filled 2012-12-22: qty 4

## 2012-12-22 MED ORDER — ASPIRIN 81 MG PO CHEW
324.0000 mg | CHEWABLE_TABLET | ORAL | Status: AC
  Administered 2012-12-22: 324 mg via ORAL
  Filled 2012-12-22: qty 4

## 2012-12-22 MED ORDER — ASPIRIN EC 81 MG PO TBEC
81.0000 mg | DELAYED_RELEASE_TABLET | Freq: Every day | ORAL | Status: DC
  Filled 2012-12-22: qty 1

## 2012-12-22 MED ORDER — PANTOPRAZOLE SODIUM 40 MG PO TBEC
40.0000 mg | DELAYED_RELEASE_TABLET | Freq: Every day | ORAL | Status: DC
  Administered 2012-12-22 – 2012-12-24 (×3): 40 mg via ORAL
  Filled 2012-12-22 (×3): qty 1

## 2012-12-22 MED ORDER — ASPIRIN 300 MG RE SUPP
300.0000 mg | RECTAL | Status: AC
  Filled 2012-12-22: qty 1

## 2012-12-22 MED ORDER — SODIUM CHLORIDE 0.9 % IV SOLN
INTRAVENOUS | Status: DC
  Administered 2012-12-23 (×2): via INTRAVENOUS

## 2012-12-22 MED ORDER — ACETAMINOPHEN 325 MG PO TABS: 650.0000 mg | ORAL_TABLET | Freq: Four times a day (QID) | ORAL | Status: DC | PRN

## 2012-12-22 MED ORDER — HEPARIN BOLUS VIA INFUSION
4000.0000 [IU] | Freq: Once | INTRAVENOUS | Status: AC
  Administered 2012-12-22: 4000 [IU] via INTRAVENOUS

## 2012-12-22 MED ORDER — NITROGLYCERIN IN D5W 200-5 MCG/ML-% IV SOLN
2.0000 ug/min | INTRAVENOUS | Status: DC
  Administered 2012-12-22: 5 ug/min via INTRAVENOUS
  Filled 2012-12-22: qty 250

## 2012-12-22 NOTE — ED Notes (Signed)
Pt c/o chest pressure x 2 weeks, 1st on set pt because nauseous and diaphoretic then pain became more severe and radiating down into left arm. Pt reports he has also been having ringing in right ear. Pt reports now the pain/pressure has eased up some but is very fatigued feeling and can't get rid of his headache. Pt in nad, skin warm and dry, resp e/u.

## 2012-12-22 NOTE — ED Notes (Signed)
Consulting PA at bedside. 

## 2012-12-22 NOTE — H&P (Signed)
Andrew Shaw is an 67 y.o. male.   Chief Complaint:  HPI:   The patient is a 67 yo overweight male with a history of continued tobacco abuse, CAD, dyslipidemia, HTN.   He was cathed in 2005 which revealed 60% ostial LAD, 50% AV groove circumflex, 50-60% RCA and normal LV function.  His last NST was Dec 2011 and was nonischemic.  The patient states he developed severe chest pain on Jan 30 with nausea, diaphoresis, and radiation to the left arm.  He said it felt like a "horse" sitting on his chest.  He tried taking some NTG which helped but the pain has never really gone away.  He also complains of DOE, dizziness, ABD pain and feeling weak, and hematochezia oscasional..  He denies vomiting, cough, LEE, orthopnea, dysuria, hematuria, melena.  Past Medical History  Diagnosis Date  . Coronary artery disease   . Hypertension   . Hyperlipemia   . Shortness of breath   . Chronic back pain   . DVT (deep venous thrombosis)     history 2004 after knee surg  . BPH (benign prostatic hyperplasia)   . Arthritis   . Neuromuscular disorder   . Carpal tunnel syndrome   . 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 GERD (gastroesophageal reflux disease)   . 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 Knee arthroscopy  04,06    left  . Joint replacement  2008    lt total knee  . Manipulation knee joint  2009    closed lt knee   . Hernia repair  2008    umb   . Cardiac catheterization  2005,96  . Cervical fusion  1999  . Back surgery  2004    lumb fusion  . Epidural block injection      multiple lumbar  . Carpal tunnel release  04/15/2012    Procedure: CARPAL TUNNEL RELEASE;  Surgeon: Nicki Reaper, MD;  Location: [facility];  Service: Orthopedics;  Laterality: Left;  . Trigger finger release  04/15/2012    Procedure: RELEASE TRIGGER FINGER/A-1 PULLEY;  Surgeon: Nicki Reaper, MD;  Location: [facility];  Service:  Orthopedics;  Laterality: Left;  left thumb and little finger    No family history on file. Social History:  reports that he has been smoking.  He does not have any smokeless tobacco history on file. He reports that he does not drink alcohol or use illicit drugs.  Allergies:  Allergies  Allergen Reactions  . Antihistamines, Chlorpheniramine-Type Itching  . Cortisone     MI  . Flexeril (Cyclobenzaprine) Itching    (Not in a hospital admission)  Results for orders placed during the hospital encounter of 12/22/12 (from the past 48 hour(s))  CBC WITH DIFFERENTIAL     Status: Abnormal   Collection Time    12/22/12  3:45 PM      Result Value Range   WBC 7.8  4.0 - 10.5 K/uL   RBC 5.52  4.22 - 5.81 MIL/uL   Hemoglobin 17.5 (*) 13.0 - 17.0 g/dL   HCT 16.1  09.6 - 04.5 %   MCV 90.0  78.0 - 100.0 fL   MCH 31.7  26.0 - 34.0 pg   MCHC 35.2  30.0 - 36.0 g/dL   RDW 40.9  81.1 - 91.4 %   Platelets 194  150 - 400  K/uL   Neutrophils Relative 59  43 - 77 %   Neutro Abs 4.6  1.7 - 7.7 K/uL   Lymphocytes Relative 26  12 - 46 %   Lymphs Abs 2.0  0.7 - 4.0 K/uL   Monocytes Relative 11  3 - 12 %   Monocytes Absolute 0.8  0.1 - 1.0 K/uL   Eosinophils Relative 4  0 - 5 %   Eosinophils Absolute 0.3  0.0 - 0.7 K/uL   Basophils Relative 0  0 - 1 %   Basophils Absolute 0.0  0.0 - 0.1 K/uL  COMPREHENSIVE METABOLIC PANEL     Status: Abnormal   Collection Time    12/22/12  3:45 PM      Result Value Range   Sodium 141  135 - 145 mEq/L   Potassium 3.5  3.5 - 5.1 mEq/L   Chloride 104  96 - 112 mEq/L   CO2 25  19 - 32 mEq/L   Glucose, Bld 145 (*) 70 - 99 mg/dL   BUN 9  6 - 23 mg/dL   Creatinine, Ser 0.45  0.50 - 1.35 mg/dL   Calcium 9.7  8.4 - 40.9 mg/dL   Total Protein 7.3  6.0 - 8.3 g/dL   Albumin 3.9  3.5 - 5.2 g/dL   AST 41 (*) 0 - 37 U/L   ALT 34  0 - 53 U/L   Alkaline Phosphatase 66  39 - 117 U/L   Total Bilirubin 0.4  0.3 - 1.2 mg/dL   GFR calc non Af Amer 62 (*) >90 mL/min   GFR calc  Af Amer 72 (*) >90 mL/min   Comment:            The eGFR has been calculated     using the CKD EPI equation.     This calculation has not been     validated in all clinical     situations.     eGFR's persistently     <90 mL/min signify     possible Chronic Kidney Disease.  POCT I-STAT TROPONIN I     Status: None   Collection Time    12/22/12  3:50 PM      Result Value Range   Troponin i, poc 0.00  0.00 - 0.08 ng/mL   Comment 3            Comment: Due to the release kinetics of cTnI,     a negative result within the first hours     of the onset of symptoms does not rule out     myocardial infarction with certainty.     If myocardial infarction is still suspected,     repeat the test at appropriate intervals.   Dg Chest 2 View  12/22/2012  *RADIOLOGY REPORT*  Clinical Data: Left-sided chest pain extending to the left arm.  CHEST - 2 VIEW  Comparison: 11/13/2010  Findings: Heart size is normal.  Mediastinal shadows are normal. Lungs are clear.  The vascularity is normal.  No effusions.  No acute bony finding.  IMPRESSION: No active disease   Original Report Authenticated By: Paulina Fusi, M.D.     Review of Systems  Constitutional: Positive for diaphoresis. Negative for fever.  Respiratory: Positive for shortness of breath. Negative for cough.   Cardiovascular: Positive for chest pain and palpitations. Negative for orthopnea, leg swelling and PND.  Gastrointestinal: Positive for nausea and abdominal pain. Negative for vomiting, diarrhea, constipation, blood in stool and melena.  Genitourinary: Negative for dysuria and hematuria.  Musculoskeletal: Positive for myalgias (RAdiation of CP to left arm).  Neurological: Positive for dizziness.    Blood pressure 162/90, pulse 109, temperature 98.1 F (36.7 C), temperature source Oral, resp. rate 18, SpO2 97.00%. Physical Exam  Constitutional: He appears well-developed.  Overweight  HENT:  Head: Normocephalic and atraumatic.  Eyes:  Pupils are equal, round, and reactive to light. No scleral icterus.  Neck: Normal range of motion. Neck supple. No JVD present.  Cardiovascular: Normal rate, regular rhythm, S1 normal and S2 normal.   No murmur heard. Diastolic murmurs: no carotid bruit.  Pulses:      Radial pulses are 2+ on the right side, and 2+ on the left side.       Dorsalis pedis pulses are 2+ on the right side, and 2+ on the left side.  Respiratory: Effort normal and breath sounds normal. No respiratory distress. He has no wheezes. He has no rales.  GI: Soft. He exhibits distension. There is tenderness (8/10 Sharp pain across Abd).  Lymphadenopathy:    He has no cervical adenopathy.  Neurological: Abnormal muscle tone: WEak.  Skin: Skin is warm and dry.  Psychiatric: He has a normal mood and affect.     Assessment/Plan Patient Active Problem List  Diagnosis  . CAD (coronary artery disease)  . Unstable angina  . Dyslipidemia  . HTN (hypertension)  . Tobacco abuse   Plan:  Admit to SS.  Start IV nitro and heparin.  Left heart cath tomorrow.  Xray abdomin. Amylase and lipase.     HAGER, BRYAN 12/22/2012, 5:09 PM  Patient seen and examined. Agree with assessment and plan. Pt is a 67 yo WM with known moderate 3 vesseal CAD by cath in 2005. He has an atherogenic dyslipidemic lipid panel, h/o ongoing tobacco abuse and HTN. He presents with a 1 1/2 week history of recurrent chest pain worrisome for accelerated angina. He also has diffuse abdominal tenderness. Will treat as [place]; start IV heparin and NTG, and add beta blocker.  R/O MI, abdominal x ray. Plan cath tomorrow. Discussed smoking cessation.    Lennette Bihari, MD, [facility] 12/22/2012 5:26 PM

## 2012-12-22 NOTE — Progress Notes (Signed)
ANTICOAGULATION CONSULT NOTE - Initial Consult  Pharmacy Consult for heparin Indication: chest pain/ACS  Allergies  Allergen Reactions  . Antihistamines, Chlorpheniramine-Type Itching  . Cortisone     MI  . Flexeril (Cyclobenzaprine) Itching    Patient Measurements: 66 inches, 197 lbs   Heparin Dosing Weight: 83 kg  Vital Signs: Temp: 98.1 F (36.7 C) (02/10 1643) Temp src: Oral (02/10 1643) BP: 162/90 mmHg (02/10 1643) Pulse Rate: 109 (02/10 1530)  Labs:  Recent Labs  12/22/12 1545  HGB 17.5*  HCT 49.7  PLT 194  CREATININE 1.19    The CrCl is unknown because both a height and weight (above a minimum accepted value) are required for this calculation.   Medical History: Past Medical History  Diagnosis Date  . Coronary artery disease   . Hypertension   . Hyperlipemia   . Shortness of breath   . Chronic back pain   . DVT (deep venous thrombosis)     history 2004 after knee surg  . BPH (benign prostatic hyperplasia)   . Arthritis   . Neuromuscular disorder   . Carpal tunnel syndrome   . 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 GERD (gastroesophageal reflux disease)   . 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 Goal of Therapy:  Heparin level 0.3-0.7 units/ml Monitor platelets by anticoagulation protocol: Yes   Plan:  Give 4000 units bolus x 1 Start heparin infusion at 1150 units/hr Check anti-Xa level in 6 hours and daily while on heparin Continue to monitor H&H and platelets  Mickeal Skinner 12/22/2012,5:35 PM

## 2012-12-22 NOTE — ED Notes (Signed)
Pt requesting pain meds for chronic back pain and his HA. Told him I could give him tylenol but requesting something stronger. Paged admitting physician.

## 2012-12-22 NOTE — ED Notes (Signed)
Pt reports CP still at a 3/10 but having a headache.

## 2012-12-22 NOTE — ED Notes (Signed)
Cp x 1 1/2 weeks ago  Got nauseated at first but now just weak and still having pressure in chest and left arm hurting

## 2012-12-23 ENCOUNTER — Encounter (HOSPITAL_COMMUNITY): Payer: Self-pay | Admitting: Cardiology

## 2012-12-23 ENCOUNTER — Encounter (HOSPITAL_COMMUNITY)
Admission: EM | Disposition: A | Discharge status: Home / Self Care | Payer: Self-pay | Source: Home / Self Care | Attending: Cardiovascular Disease

## 2012-12-23 DIAGNOSIS — I2510 Atherosclerotic heart disease of native coronary artery without angina pectoris: Secondary | ICD-10-CM | POA: Diagnosis not present

## 2012-12-23 DIAGNOSIS — I10 Essential (primary) hypertension: Secondary | ICD-10-CM | POA: Diagnosis not present

## 2012-12-23 DIAGNOSIS — Z9861 Coronary angioplasty status: Secondary | ICD-10-CM | POA: Diagnosis not present

## 2012-12-23 DIAGNOSIS — M549 Dorsalgia, unspecified: Secondary | ICD-10-CM

## 2012-12-23 DIAGNOSIS — F17200 Nicotine dependence, unspecified, uncomplicated: Secondary | ICD-10-CM | POA: Diagnosis not present

## 2012-12-23 DIAGNOSIS — Z95820 Peripheral vascular angioplasty status with implants and grafts: Secondary | ICD-10-CM

## 2012-12-23 DIAGNOSIS — E785 Hyperlipidemia, unspecified: Secondary | ICD-10-CM | POA: Diagnosis not present

## 2012-12-23 DIAGNOSIS — I200 Unstable angina: Secondary | ICD-10-CM | POA: Diagnosis not present

## 2012-12-23 HISTORY — PX: LEFT HEART CATHETERIZATION WITH CORONARY ANGIOGRAM: SHX5451

## 2012-12-23 HISTORY — PX: PERCUTANEOUS CORONARY STENT INTERVENTION (PCI-S): SHX5485

## 2012-12-23 HISTORY — DX: Dorsalgia, unspecified: M54.9

## 2012-12-23 HISTORY — PX: CARDIAC CATHETERIZATION: SHX172

## 2012-12-23 HISTORY — DX: Peripheral vascular angioplasty status with implants and grafts: Z95.820

## 2012-12-23 LAB — BASIC METABOLIC PANEL
BUN: 11 mg/dL (ref 6–23)
Calcium: 9.3 mg/dL (ref 8.4–10.5)
Creatinine, Ser: 1.11 mg/dL (ref 0.50–1.35)
GFR calc Af Amer: 78 mL/min — ABNORMAL LOW (ref >90)
GFR calc non Af Amer: 67 mL/min — ABNORMAL LOW (ref >90)
Glucose, Bld: 110 mg/dL — ABNORMAL HIGH (ref 70–99)

## 2012-12-23 LAB — CBC
Hemoglobin: 16.1 g/dL (ref 13.0–17.0)
MCH: 31.4 pg (ref 26.0–34.0)
RBC: 5.12 MIL/uL (ref 4.22–5.81)
WBC: 8.8 10*3/uL (ref 4.0–10.5)

## 2012-12-23 LAB — LIPID PANEL
Cholesterol: 164 mg/dL (ref 0–200)
HDL: 40 mg/dL (ref >39)
Total CHOL/HDL Ratio: 4.1 RATIO
VLDL: 30 mg/dL (ref 0–40)

## 2012-12-23 LAB — TROPONIN I: Troponin I: <0.3 ng/mL (ref <0.30)

## 2012-12-23 LAB — LIPASE, BLOOD: Lipase: 33 U/L (ref 11–59)

## 2012-12-23 SURGERY — LEFT HEART CATHETERIZATION WITH CORONARY ANGIOGRAM
Anesthesia: LOCAL

## 2012-12-23 MED ORDER — VERAPAMIL HCL 2.5 MG/ML IV SOLN
INTRAVENOUS | Status: AC
  Filled 2012-12-23: qty 2

## 2012-12-23 MED ORDER — HEPARIN SODIUM (PORCINE) 1000 UNIT/ML IJ SOLN
INTRAMUSCULAR | Status: AC
  Filled 2012-12-23: qty 1

## 2012-12-23 MED ORDER — HEPARIN (PORCINE) IN NACL 2-0.9 UNIT/ML-% IJ SOLN
INTRAMUSCULAR | Status: AC
  Filled 2012-12-23: qty 1000

## 2012-12-23 MED ORDER — TICAGRELOR 90 MG PO TABS
ORAL_TABLET | ORAL | Status: AC
  Filled 2012-12-23: qty 2

## 2012-12-23 MED ORDER — ONDANSETRON HCL 4 MG/2ML IJ SOLN: 4.0000 mg | Freq: Four times a day (QID) | INTRAMUSCULAR | Status: DC | PRN

## 2012-12-23 MED ORDER — NITROGLYCERIN 1 MG/10 ML FOR IR/CATH LAB
INTRA_ARTERIAL | Status: AC
  Filled 2012-12-23: qty 10

## 2012-12-23 MED ORDER — ASPIRIN 81 MG PO CHEW
81.0000 mg | CHEWABLE_TABLET | Freq: Every day | ORAL | Status: DC
  Administered 2012-12-24: 81 mg via ORAL
  Filled 2012-12-23: qty 1

## 2012-12-23 MED ORDER — FENTANYL CITRATE 0.05 MG/ML IJ SOLN
INTRAMUSCULAR | Status: AC
  Filled 2012-12-23: qty 2

## 2012-12-23 MED ORDER — SODIUM CHLORIDE 0.9 % IV SOLN: INTRAVENOUS | Status: AC

## 2012-12-23 MED ORDER — MORPHINE SULFATE 2 MG/ML IJ SOLN: 2.0000 mg | INTRAMUSCULAR | Status: DC | PRN

## 2012-12-23 MED ORDER — BIVALIRUDIN 250 MG IV SOLR
INTRAVENOUS | Status: AC
  Filled 2012-12-23: qty 250

## 2012-12-23 MED ORDER — ACETAMINOPHEN 325 MG PO TABS: 650.0000 mg | ORAL_TABLET | ORAL | Status: DC | PRN

## 2012-12-23 MED ORDER — TICAGRELOR 90 MG PO TABS
90.0000 mg | ORAL_TABLET | Freq: Two times a day (BID) | ORAL | Status: DC
  Administered 2012-12-23 – 2012-12-24 (×2): 90 mg via ORAL
  Filled 2012-12-23 (×3): qty 1

## 2012-12-23 MED ORDER — MIDAZOLAM HCL 2 MG/2ML IJ SOLN
INTRAMUSCULAR | Status: AC
  Filled 2012-12-23: qty 2

## 2012-12-23 MED ORDER — HEPARIN BOLUS VIA INFUSION
2000.0000 [IU] | Freq: Once | INTRAVENOUS | Status: AC
  Administered 2012-12-23: 2000 [IU] via INTRAVENOUS
  Filled 2012-12-23: qty 2000

## 2012-12-23 MED ORDER — LIDOCAINE HCL (PF) 1 % IJ SOLN
INTRAMUSCULAR | Status: AC
  Filled 2012-12-23: qty 30

## 2012-12-23 NOTE — CV Procedure (Signed)
Andrew Shaw is a 67 y.o. male    [id number] LOCATION:  FACILITY: [facility]  PHYSICIAN: Nanetta Batty, M.D. 18-Jul-1946   DATE OF PROCEDURE:  12/23/2012  DATE OF DISCHARGE:  [facility]  CARDIAC CATHETERIZATION / Intervention    History obtained from chart review. The patient is a 67 yo overweight male with a history of continued tobacco abuse, CAD, dyslipidemia, HTN. He was cathed in 2005 which revealed 60% ostial LAD, 50% AV groove circumflex, 50-60% RCA and normal LV function. His last NST was Dec 2011 and was nonischemic. The patient states he developed severe chest pain on Jan 30 with nausea, diaphoresis, and radiation to the left arm. He said it felt like a "horse" sitting on his chest. He tried taking some NTG which helped but the pain has never really gone away. He also complains of DOE, dizziness, ABD pain and feeling weak, and hematochezia oscasional.. He denies vomiting, cough, LEE, orthopnea, dysuria, hematuria, melena.    PROCEDURE DESCRIPTION:    The patient was brought to the second floor  [facility] lab in the postabsorptive state. He was  premedicated with Valium 5 mg by mouth, IV Versed and fentanyl.Marland Kitchen His right wrist was prepped and shaved in usual sterile fashion. Xylocaine 1% was used  for local anesthesia. A 5 French sheath was inserted into the right radial  artery using standard Seldinger technique. The patient received  5000 units  of heparin  intravenously.  A 5 [place] TIG catheter and pigtail catheter were used for selective coronary angiography and left ventriculography respectively. Visipaque dye was used for the entirety of the case. Retrograde aorta, left ventricular and pullback pressures were recorded.    HEMODYNAMICS:    AO SYSTOLIC/AO DIASTOLIC: 151/73   LV SYSTOLIC/LV DIASTOLIC: 153/18  ANGIOGRAPHIC RESULTS:   1. Left main; normal  2. LAD; normal 3. Left circumflex; nondominant with a 90% stenosis in  the proximal to mid AV groove circumflex. There was a large bifurcating ramus branch..  4. Right coronary artery; dominant with 50-60% fairly focal lesions in the mid and distal vessel. 5. Left ventriculography; RAO left ventriculogram was performed using  25 mL of Visipaque dye at 12 mL/second. The overall LVEF estimated  60 %  Without wall motion abnormalities  IMPRESSION:Mr. 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 I believe that the disease in his right coronary artery is noncritical and can be treated medically. Will proceed with PCI and stenting using a bare-metal stent in Intimax because he complained of hematochezia  Procedure description: The 5 French sheath in the right radial artery was exchanged over a wire for a 6 [place] sheath. The patient received Angiomax bolus and ACT of 550. Total contrast administered to the patient was 140 cc. He received 4 baby aspirin this morning and 180 mg of Brilenta loading  dose loading .using a 6 [place] XB 3.5 cm guide catheter along with a 1/190 cm length Pro water guidewire and a 2 mm x 12 mm long trek  balloon predilatation was performed. Following this stenting was performed with a 2.5 mm x 15 mm long mini vision stent at 16 atmospheres (2.81 mm) resulting in reduction of a 90% stenosis to 0% residual. 200 mcg of intra-coronary nitroglycerin was administered and final angiography was performed.  Final impression: Successful PCI and stenting of a high-grade mid AV groove circumflex stenosis with a bare-metal stent, Angiomax, and Brilenta. The patient tolerated  the procedure well. The sheath was removed and a TR band was placed on the right wrist to achieve patent hemostasis. The patient left the Cath Lab in stable condition. He will be discharged home tomorrow and his RCA will be treated medically.  Runell Gess MD, [facility] 12/23/2012 4:38 PM

## 2012-12-23 NOTE — Progress Notes (Signed)
Subjective:  No CP/SOB  Objective:  Temp:  [97.4 F (36.3 C)-98.1 F (36.7 C)] 97.5 F (36.4 C) (02/11 0400) Pulse Rate:  [54-109] 60 (02/11 0400) Resp:  [11-18] 17 (02/11 0400) BP: (114-172)/(58-101) 128/65 mmHg (02/11 0400) SpO2:  [90 %-98 %] 90 % (02/11 0400) Weight:  [87.1 kg (192 lb 0.3 oz)] 87.1 kg (192 lb 0.3 oz) (02/10 2005) Weight change:   Intake/Output from previous day: 02/10 0701 - 02/11 0700 In: 403 [I.V.:403] Out: 700 [Urine:700]  Intake/Output from this shift:    Physical Exam: General appearance: alert, cooperative and no distress Neck: no adenopathy, no carotid bruit, no JVD, supple, symmetrical, trachea midline and thyroid not enlarged, symmetric, no tenderness/mass/nodules Lungs: clear to auscultation bilaterally and normal percussion bilaterally Heart: regular rate and rhythm, S1, S2 normal, no murmur, click, rub or gallop Extremities: extremities normal, atraumatic, no cyanosis or edema  Lab Results: Results for orders placed during the hospital encounter of 12/22/12 (from the past 48 hour(s))  CBC WITH DIFFERENTIAL     Status: Abnormal   Collection Time    12/22/12  3:45 PM      Result Value Range   WBC 7.8  4.0 - 10.5 K/uL   RBC 5.52  4.22 - 5.81 MIL/uL   Hemoglobin 17.5 (*) 13.0 - 17.0 g/dL   HCT 91.4  78.2 - 95.6 %   MCV 90.0  78.0 - 100.0 fL   MCH 31.7  26.0 - 34.0 pg   MCHC 35.2  30.0 - 36.0 g/dL   RDW 21.3  08.6 - 57.8 %   Platelets 194  150 - 400 K/uL   Neutrophils Relative 59  43 - 77 %   Neutro Abs 4.6  1.7 - 7.7 K/uL   Lymphocytes Relative 26  12 - 46 %   Lymphs Abs 2.0  0.7 - 4.0 K/uL   Monocytes Relative 11  3 - 12 %   Monocytes Absolute 0.8  0.1 - 1.0 K/uL   Eosinophils Relative 4  0 - 5 %   Eosinophils Absolute 0.3  0.0 - 0.7 K/uL   Basophils Relative 0  0 - 1 %   Basophils Absolute 0.0  0.0 - 0.1 K/uL  COMPREHENSIVE METABOLIC PANEL     Status: Abnormal   Collection Time    12/22/12  3:45 PM      Result Value Range   Sodium 141  135 - 145 mEq/L   Potassium 3.5  3.5 - 5.1 mEq/L   Chloride 104  96 - 112 mEq/L   CO2 25  19 - 32 mEq/L   Glucose, Bld 145 (*) 70 - 99 mg/dL   BUN 9  6 - 23 mg/dL   Creatinine, Ser 4.69  0.50 - 1.35 mg/dL   Calcium 9.7  8.4 - 62.9 mg/dL   Total Protein 7.3  6.0 - 8.3 g/dL   Albumin 3.9  3.5 - 5.2 g/dL   AST 41 (*) 0 - 37 U/L   ALT 34  0 - 53 U/L   Alkaline Phosphatase 66  39 - 117 U/L   Total Bilirubin 0.4  0.3 - 1.2 mg/dL   GFR calc non Af Amer 62 (*) >90 mL/min   GFR calc Af Amer 72 (*) >90 mL/min   Comment:            The eGFR has been calculated     using the CKD EPI equation.     This calculation has not been  validated in all clinical     situations.     eGFR's persistently     <90 mL/min signify     possible Chronic Kidney Disease.  POCT I-STAT TROPONIN I     Status: None   Collection Time    12/22/12  3:50 PM      Result Value Range   Troponin i, poc 0.00  0.00 - 0.08 ng/mL   Comment 3            Comment: Due to the release kinetics of cTnI,     a negative result within the first hours     of the onset of symptoms does not rule out     myocardial infarction with certainty.     If myocardial infarction is still suspected,     repeat the test at appropriate intervals.  MRSA PCR SCREENING     Status: None   Collection Time    12/22/12  8:09 PM      Result Value Range   MRSA by PCR NEGATIVE  NEGATIVE   Comment:            The GeneXpert MRSA Assay (FDA     approved for NASAL specimens     only), is one component of a     comprehensive MRSA colonization     surveillance program. It is not     intended to diagnose MRSA     infection nor to guide or     monitor treatment for     MRSA infections.  TROPONIN I     Status: None   Collection Time    12/22/12  8:58 PM      Result Value Range   Troponin I <0.30  <0.30 ng/mL   Comment:            Due to the release kinetics of cTnI,     a negative result within the first hours     of the onset of  symptoms does not rule out     myocardial infarction with certainty.     If myocardial infarction is still suspected,     repeat the test at appropriate intervals.  HEPARIN LEVEL (UNFRACTIONATED)     Status: Abnormal   Collection Time    12/22/12 11:53 PM      Result Value Range   Heparin Unfractionated 0.23 (*) 0.30 - 0.70 IU/mL   Comment:            IF HEPARIN RESULTS ARE BELOW     EXPECTED VALUES, AND PATIENT     DOSAGE HAS BEEN CONFIRMED,     SUGGEST FOLLOW UP TESTING     OF ANTITHROMBIN III LEVELS.  AMYLASE     Status: None   Collection Time    12/22/12 11:53 PM      Result Value Range   Amylase 73  0 - 105 U/L  LIPASE, BLOOD     Status: None   Collection Time    12/22/12 11:53 PM      Result Value Range   Lipase 33  11 - 59 U/L  CBC     Status: None   Collection Time    12/23/12  2:43 AM      Result Value Range   WBC 8.8  4.0 - 10.5 K/uL   RBC 5.12  4.22 - 5.81 MIL/uL   Hemoglobin 16.1  13.0 - 17.0 g/dL   HCT 16.1  09.6 - 04.5 %  MCV 89.8  78.0 - 100.0 fL   MCH 31.4  26.0 - 34.0 pg   MCHC 35.0  30.0 - 36.0 g/dL   RDW 40.9  81.1 - 91.4 %   Platelets 173  150 - 400 K/uL  TROPONIN I     Status: None   Collection Time    12/23/12  2:43 AM      Result Value Range   Troponin I <0.30  <0.30 ng/mL   Comment:            Due to the release kinetics of cTnI,     a negative result within the first hours     of the onset of symptoms does not rule out     myocardial infarction with certainty.     If myocardial infarction is still suspected,     repeat the test at appropriate intervals.  LIPID PANEL     Status: Abnormal   Collection Time    12/23/12  2:43 AM      Result Value Range   Cholesterol 164  0 - 200 mg/dL   Triglycerides 782 (*) <150 mg/dL   HDL 40  >95 mg/dL   Total CHOL/HDL Ratio 4.1     VLDL 30  0 - 40 mg/dL   LDL Cholesterol 94  0 - 99 mg/dL   Comment:            Total Cholesterol/HDL:CHD Risk     Coronary Heart Disease Risk Table                          Men   Women      1/2 Average Risk   3.4   3.3      Average Risk       5.0   4.4      2 X Average Risk   9.6   7.1      3 X Average Risk  23.4   11.0                Use the calculated Patient Ratio     above and the CHD Risk Table     to determine the patient's CHD Risk.                ATP III CLASSIFICATION (LDL):      <100     mg/dL   Optimal      621-308  mg/dL   Near or Above                        Optimal      130-159  mg/dL   Borderline      657-846  mg/dL   High      >962     mg/dL   Very High    Imaging: Imaging results have been reviewed  Assessment/Plan:   1. Principal Problem: 2.   Unstable angina 3. Active Problems: 4.   CAD (coronary artery disease) 5.   Dyslipidemia 6.   HTN (hypertension) 7.   Tobacco abuse 8.   Time Spent Directly with Patient:  20 minutes  Length of Stay:  LOS: 1 day   Known CAD by cath 02/07/04. Admitted with [place]. Currently on IV hep/NTG. Labs OK. ENZ neg. EKG w/o acute changes. Plan cath this morning. Discussed with pt and wife. Exam benign. Will do radially since pt has back pain and difficulty lying  flatRunell Gess 12/23/2012, 7:54 AM

## 2012-12-23 NOTE — H&P (Signed)
  H & P will be scanned in.  Pt was reexamined and existing H & P reviewed. No changes found.  Runell Gess, MD [facility] 12/23/2012 3:26 PM

## 2012-12-23 NOTE — Progress Notes (Signed)
Pt transferred to cath lab with IVF infusion, O2 2l [place], monitor, and RN; family to cath lab waiting area; questions encouraged and answered

## 2012-12-23 NOTE — Progress Notes (Signed)
ANTICOAGULATION CONSULT NOTE  Pharmacy Consult for heparin Indication: chest pain/ACS  Allergies  Allergen Reactions  . Antihistamines, Chlorpheniramine-Type Itching  . Cortisone     MI  . Flexeril (Cyclobenzaprine) Itching    Patient Measurements: 66 inches, 197 lbs Height: 5\' 6"  (167.6 cm) Weight: 192 lb 0.3 oz (87.1 kg) IBW/kg (Calculated) : 63.8 Heparin Dosing Weight: 83 kg  Vital Signs: Temp: 98.3 F (36.8 C) (02/11 0817) Temp src: Oral (02/11 0817) BP: 137/65 mmHg (02/11 0817) Pulse Rate: 70 (02/11 0817)  Labs:  Recent Labs  12/22/12 1545 12/22/12 2058 12/22/12 2353 12/23/12 0243 12/23/12 0913  HGB 17.5*  --   --  16.1  --   HCT 49.7  --   --  46.0  --   PLT 194  --   --  173  --   LABPROT  --   --   --   --  13.3  INR  --   --   --   --  1.02  HEPARINUNFRC  --   --  0.23*  --  0.38  CREATININE 1.19  --   --   --   --   TROPONINI  --  <0.30  --  <0.30  --     Estimated Creatinine Clearance: 63.1 ml/min (by C-G formula based on Cr of 1.19).  Assessment: 67 year old male with chest pain currently therapeutic on IV heparin. CBC within normal limits this am, no bleeding issues noted. Plan for cath today.  Goal of Therapy:  Heparin level 0.3-0.7 units/ml Monitor platelets by anticoagulation protocol: Yes   Plan:  Continue heparin 1350 units/hr Follow-up after cath  Severiano Gilbert 12/23/2012,10:45 AM

## 2012-12-23 NOTE — Care Management Note (Addendum)
    Page 1 of 1   12/24/2012     9:58:44 AM   CARE MANAGEMENT NOTE 12/24/2012  Patient:  Andrew Shaw, Andrew Shaw   Account Number:  [id number]  Date Initiated:  12/23/2012  Documentation initiated by:  Junius Creamer  Subjective/Objective Assessment:   adm w angina     Action/Plan:   lives w wife   Anticipated DC Date:  12/24/2012   Anticipated DC Plan:  HOME/SELF CARE      DC Planning Services  CM consult      Choice offered to / List presented to:             Status of service:   Medicare Important Message given?   (If response is "NO", the following Medicare IM given date fields will be blank) Date Medicare IM given:   Date Additional Medicare IM given:    Discharge Disposition:  HOME/SELF CARE  Per UR Regulation:  Reviewed for med. necessity/level of care/duration of stay  If discussed at Long Length of Stay Meetings, dates discussed:    Comments:  2/12 0975 debbie Thayne Cindric rn,bsn pt was started on brilinta. given brilinta 30day free and copay assist card by nse.  2/11 1041 debbie Verlee Pope rn,bsn

## 2012-12-23 NOTE — Progress Notes (Signed)
ANTICOAGULATION CONSULT NOTE  Pharmacy Consult for heparin Indication: chest pain/ACS  Allergies  Allergen Reactions  . Antihistamines, Chlorpheniramine-Type Itching  . Cortisone     MI  . Flexeril (Cyclobenzaprine) Itching    Patient Measurements: 66 inches, 197 lbs Height: 5\' 6"  (167.6 cm) Weight: 192 lb 0.3 oz (87.1 kg) IBW/kg (Calculated) : 63.8 Heparin Dosing Weight: 83 kg  Vital Signs: Temp: 97.4 F (36.3 C) (02/10 2005) Temp src: Oral (02/10 2005) BP: 142/76 mmHg (02/10 2200) Pulse Rate: 73 (02/10 2200)  Labs:  Recent Labs  12/22/12 1545 12/22/12 2058 12/22/12 2353  HGB 17.5*  --   --   HCT 49.7  --   --   PLT 194  --   --   HEPARINUNFRC  --   --  0.23*  CREATININE 1.19  --   --   TROPONINI  --  <0.30  --     Estimated Creatinine Clearance: 63.1 ml/min (by C-G formula based on Cr of 1.19).  Assessment: 67 year old male with chest pain for heparin  Goal of Therapy:  Heparin level 0.3-0.7 units/ml Monitor platelets by anticoagulation protocol: Yes   Plan:  Heparin 2000 units IV bolus, then increase heparin 1350 units/hr Follow-up am labs.  Naelle Diegel, Gary Fleet 12/23/2012,12:39 AM

## 2012-12-24 DIAGNOSIS — I10 Essential (primary) hypertension: Secondary | ICD-10-CM | POA: Diagnosis not present

## 2012-12-24 DIAGNOSIS — E785 Hyperlipidemia, unspecified: Secondary | ICD-10-CM | POA: Diagnosis not present

## 2012-12-24 DIAGNOSIS — I2510 Atherosclerotic heart disease of native coronary artery without angina pectoris: Secondary | ICD-10-CM | POA: Diagnosis not present

## 2012-12-24 DIAGNOSIS — Z9861 Coronary angioplasty status: Secondary | ICD-10-CM | POA: Diagnosis not present

## 2012-12-24 DIAGNOSIS — F17200 Nicotine dependence, unspecified, uncomplicated: Secondary | ICD-10-CM | POA: Diagnosis not present

## 2012-12-24 LAB — BASIC METABOLIC PANEL
BUN: 11 mg/dL (ref 6–23)
Calcium: 9.2 mg/dL (ref 8.4–10.5)
GFR calc Af Amer: 86 mL/min — ABNORMAL LOW (ref >90)
GFR calc non Af Amer: 75 mL/min — ABNORMAL LOW (ref >90)
Glucose, Bld: 115 mg/dL — ABNORMAL HIGH (ref 70–99)
Potassium: 3.7 mEq/L (ref 3.5–5.1)
Sodium: 139 mEq/L (ref 135–145)

## 2012-12-24 LAB — CBC
HCT: 44.9 % (ref 39.0–52.0)
Hemoglobin: 15.6 g/dL (ref 13.0–17.0)
MCV: 89.6 fL (ref 78.0–100.0)
RBC: 5.01 MIL/uL (ref 4.22–5.81)
WBC: 10.4 10*3/uL (ref 4.0–10.5)

## 2012-12-24 MED ORDER — LISINOPRIL 10 MG PO TABS
10.0000 mg | ORAL_TABLET | Freq: Once | ORAL | Status: AC
  Administered 2012-12-24: 10 mg via ORAL
  Filled 2012-12-24: qty 1

## 2012-12-24 MED ORDER — TICAGRELOR 90 MG PO TABS: 90.0000 mg | ORAL_TABLET | Freq: Two times a day (BID) | ORAL | Status: DC

## 2012-12-24 MED ORDER — NITROGLYCERIN 0.4 MG SL SUBL: 0.4000 mg | SUBLINGUAL_TABLET | SUBLINGUAL | Status: AC | PRN

## 2012-12-24 MED ORDER — METOPROLOL TARTRATE 50 MG PO TABS: 50.0000 mg | ORAL_TABLET | Freq: Two times a day (BID) | ORAL | Status: DC

## 2012-12-24 MED ORDER — HYDROCODONE-ACETAMINOPHEN 5-325 MG PO TABS
2.0000 | ORAL_TABLET | ORAL | Status: DC | PRN
  Administered 2012-12-24 (×2): 2 via ORAL
  Filled 2012-12-24 (×2): qty 2

## 2012-12-24 MED ORDER — LISINOPRIL 10 MG PO TABS: 10.0000 mg | ORAL_TABLET | Freq: Every day | ORAL | Status: DC

## 2012-12-24 MED FILL — Dextrose Inj 5%: INTRAVENOUS | Qty: 50 | Status: AC

## 2012-12-24 NOTE — Progress Notes (Signed)
Pt. Seen and examined. Agree with the NP/PA-C note as written.  Doing well without angina. EKG benign today. Radial cath site without hematoma, +pulse, good CR. Further discussed smoking cessation. Agree with medication changes. Short supply of pain medication can be provided for back pain. Ok for discharge today. Follow-up with mid-level in our office in 5-7 days.  Chrystie Nose, MD, [facility] Attending Cardiologist [facility]

## 2012-12-24 NOTE — Progress Notes (Signed)
Reviewed discharge instructions with the patient. Family at bedside. No questions at present. Will continue to monitor. Will be discharged via wheelchair.   Sem Mccaughey, Charlaine Dalton RN

## 2012-12-24 NOTE — Progress Notes (Signed)
CARDIAC REHAB PHASE I   PRE:  Rate/Rhythm: 71SR  BP:  Supine: 152/73  Sitting:   Standing:    SaO2:   MODE:  Ambulation: 700 ft   POST:  Rate/Rhythem: 93SR  BP:  Supine:   Sitting: 176/79  Standing:    SaO2:  1000-1120 Pt walked 785ft with steady gait. Tolerated well. No Cp. Education completed with pt and wife. Understanding voiced. Discussed smoking cessation. Gave fake cigarette and handouts. Pt states he is going to try the gum. Encouraged him to call [id number] if needed. Discussed CRP 2 and pt gave permission to refer to [place] Phase 2.  Andrew Shaw

## 2012-12-24 NOTE — Progress Notes (Signed)
Subjective:  No chest pain overnight. Main complaint in back pain.  Objective:  Vital Signs in the last 24 hours: Temp:  [97.7 F (36.5 C)-98.6 F (37 C)] 98 F (36.7 C) (02/12 0400) Pulse Rate:  [57-83] 58 (02/12 0522) Resp:  [10-20] 10 (02/12 0522) BP: (126-164)/(63-85) 126/76 mmHg (02/12 0522) SpO2:  [93 %-100 %] 98 % (02/12 0522) Weight:  [86.2 kg (190 lb 0.6 oz)] 86.2 kg (190 lb 0.6 oz) (02/12 0626)  Intake/Output from previous day:  Intake/Output Summary (Last 24 hours) at 12/24/12 0757 Last data filed at 12/24/12 0000  Gross per 24 hour  Intake 2089.03 ml  Output   1900 ml  Net 189.03 ml    Physical Exam: General appearance: alert, cooperative and no distress Lungs: clear to auscultation bilaterally Heart: regular rate and rhythm Rt wrist without hematoma   Rate: 70  Rhythm: normal sinus rhythm  Lab Results:  Recent Labs  12/23/12 0243 12/24/12 0445  WBC 8.8 10.4  HGB 16.1 15.6  PLT 173 179    Recent Labs  12/23/12 0913 12/24/12 0445  NA 140 139  K 4.2 3.7  CL 105 109  CO2 23 22  GLUCOSE 110* 115*  BUN 11 11  CREATININE 1.11 1.02    Recent Labs  12/23/12 0243 12/23/12 0913  TROPONINI <0.30 <0.30   Hepatic Function Panel  Recent Labs  12/22/12 1545  PROT 7.3  ALBUMIN 3.9  AST 41*  ALT 34  ALKPHOS 66  BILITOT 0.4    Recent Labs  12/23/12 0243  CHOL 164    Recent Labs  12/23/12 0913  INR 1.02    Imaging: Imaging results have been reviewed  Cardiac Studies:  Assessment/Plan:   Principal Problem:   Unstable angina Active Problems:   S/P angioplasty with stent, BMS to LCX  12/23/12   CAD (coronary artery disease), cath 2005 with non obstructive disease, now 12/2102 with LCX stenosis culprit vessel   Dyslipidemia   HTN (hypertension)   Tobacco abuse   Back pain, followed at [facility] pain clinic   Plan- discharge today. Follow up with extender one to two weeks. Beta blocker has been increased. I discussed smoking  cessation and pt says he has quit. Add ACE for HTN.   Corine Shelter PA-C 12/24/2012, 7:57 AM

## 2013-01-06 DIAGNOSIS — I10 Essential (primary) hypertension: Secondary | ICD-10-CM | POA: Diagnosis not present

## 2013-01-06 DIAGNOSIS — I2510 Atherosclerotic heart disease of native coronary artery without angina pectoris: Secondary | ICD-10-CM | POA: Diagnosis not present

## 2013-01-12 NOTE — Discharge Summary (Signed)
Patient ID: Andrew Shaw,  MRN: [id number], DOB/AGE: Oct 04, 1946 67 y.o.  Admit date: 12/22/2012 Discharge date: 12/24/12  Primary Care Provider:  Primary Cardiologist: Dr Tresa Endo  Discharge Diagnoses Principal Problem:   Unstable angina Active Problems:   S/P angioplasty with stent, BMS to LCX  12/23/12   CAD (coronary artery disease), cath 2005 with non obstructive disease, now 12/2102 with LCX stenosis culprit vessel   Dyslipidemia   HTN (hypertension)   Tobacco abuse   Back pain, followed at [facility] pain clinic    Procedures: Cath/PCI 12/23/12   Hospital Course:  The patient is a 67 yo overweight male with a history of tobacco abuse, CAD, dyslipidemia, and HTN. He was cathed in 2005 which revealed 60% ostial LAD, 50% AV groove circumflex, 50-60% RCA and normal LV function. His last NST was Dec 2011 and was nonischemic. The patient states he developed severe chest pain on Jan 30 with nausea, diaphoresis, and radiation to the left arm. He said it felt like a "horse" sitting on his chest. He tried taking some NTG which helped but the pain never really went away. He was admitted 67/01/14. He ruled out for an MI. He was taken to the cath lab 12/23/12 by Dr Allyson Sabal. This revealed a nondominant left circumflex;  with a 90% stenosis in the proximal to mid AV groove. He underwent successful PCI and stenting of a high-grade mid AV groove circumflex stenosis with a bare-metal stent The patient tolerated the procedure well. We felt he could be discharged 12/24/12. He does have residual 50-60% RCA that will be treated medically. The LAD was normal. At discharge the pt says he has decided to quit smoking.     Discharge Vitals:  Blood pressure 172/87, pulse 70, temperature 98 F (36.7 C), temperature source Oral, resp. rate 10, height 5\' 6"  (1.676 m), weight 86.2 kg (190 lb 0.6 oz), SpO2 98.00%.    Labs: No results found for this or any previous visit (from the past 48 hour(s)).  Disposition:   Follow-up Information   Follow up with Abelino Derrick, PA On 01/06/2013. (at 10:00AM with Dr. Hazle Coca physician assistant)    Contact information:   [address] [place] [address] [phone]       Discharge Medications:    Medication List    STOP taking these medications       metoprolol succinate 50 MG 24 hr tablet  Commonly known as:  TOPROL-XL      TAKE these medications       aspirin EC 81 MG tablet  Take 81 mg by mouth daily.     esomeprazole 40 MG capsule  Commonly known as:  NEXIUM  Take 40 mg by mouth daily as needed. For acid indigestion from spicy food     fenofibrate 48 MG tablet  Commonly known as:  TRICOR  Take 48 mg by mouth daily.     HYDROcodone-acetaminophen 5-500 MG per tablet  Commonly known as:  VICODIN  Take 1 tablet by mouth 3 (three) times daily as needed. For breakthrough pain     lisinopril 10 MG tablet  Commonly known as:  PRINIVIL,ZESTRIL  Take 1 tablet (10 mg total) by mouth daily.     metoprolol 50 MG tablet  Commonly known as:  LOPRESSOR  Take 1 tablet (50 mg total) by mouth 2 (two) times daily.     nitroGLYCERIN 0.4 MG SL tablet  Commonly known as:  NITROSTAT  Place 1 tablet (0.4 mg total)  under the tongue every 5 (five) minutes x 3 doses as needed for chest pain.     oxyCODONE 20 MG 12 hr tablet  Commonly known as:  OXYCONTIN  Take 20 mg by mouth 3 (three) times daily.     rosuvastatin 20 MG tablet  Commonly known as:  CRESTOR  Take 20 mg by mouth daily.     Ticagrelor 90 MG Tabs tablet  Commonly known as:  BRILINTA  Take 1 tablet (90 mg total) by mouth 2 (two) times daily.          Duration of Discharge Encounter: Greater than 30 minutes including physician time.  Jolene Provost PA-C 01/12/2013 10:55 AM

## 2013-01-13 DIAGNOSIS — L821 Other seborrheic keratosis: Secondary | ICD-10-CM | POA: Diagnosis not present

## 2013-01-13 DIAGNOSIS — E782 Mixed hyperlipidemia: Secondary | ICD-10-CM | POA: Diagnosis not present

## 2013-01-13 DIAGNOSIS — M159 Polyosteoarthritis, unspecified: Secondary | ICD-10-CM | POA: Diagnosis not present

## 2013-01-13 DIAGNOSIS — Z125 Encounter for screening for malignant neoplasm of prostate: Secondary | ICD-10-CM | POA: Diagnosis not present

## 2013-01-13 DIAGNOSIS — I2510 Atherosclerotic heart disease of native coronary artery without angina pectoris: Secondary | ICD-10-CM | POA: Diagnosis not present

## 2013-01-13 DIAGNOSIS — Z23 Encounter for immunization: Secondary | ICD-10-CM | POA: Diagnosis not present

## 2013-01-13 DIAGNOSIS — Z0000 Encounter for general adult medical examination without abnormal findings: Secondary | ICD-10-CM | POA: Diagnosis not present

## 2013-01-14 DIAGNOSIS — I10 Essential (primary) hypertension: Secondary | ICD-10-CM | POA: Diagnosis not present

## 2013-01-14 DIAGNOSIS — E782 Mixed hyperlipidemia: Secondary | ICD-10-CM | POA: Diagnosis not present

## 2013-01-14 DIAGNOSIS — I2510 Atherosclerotic heart disease of native coronary artery without angina pectoris: Secondary | ICD-10-CM | POA: Diagnosis not present

## 2013-02-03 DIAGNOSIS — G894 Chronic pain syndrome: Secondary | ICD-10-CM | POA: Diagnosis not present

## 2013-02-03 DIAGNOSIS — M47817 Spondylosis without myelopathy or radiculopathy, lumbosacral region: Secondary | ICD-10-CM | POA: Diagnosis not present

## 2013-02-03 DIAGNOSIS — IMO0002 Reserved for concepts with insufficient information to code with codable children: Secondary | ICD-10-CM | POA: Diagnosis not present

## 2013-03-10 DIAGNOSIS — M25569 Pain in unspecified knee: Secondary | ICD-10-CM | POA: Diagnosis not present

## 2013-03-10 DIAGNOSIS — M47817 Spondylosis without myelopathy or radiculopathy, lumbosacral region: Secondary | ICD-10-CM | POA: Diagnosis not present

## 2013-03-10 DIAGNOSIS — G894 Chronic pain syndrome: Secondary | ICD-10-CM | POA: Diagnosis not present

## 2013-03-10 DIAGNOSIS — IMO0002 Reserved for concepts with insufficient information to code with codable children: Secondary | ICD-10-CM | POA: Diagnosis not present

## 2013-04-14 DIAGNOSIS — IMO0002 Reserved for concepts with insufficient information to code with codable children: Secondary | ICD-10-CM | POA: Diagnosis not present

## 2013-04-14 DIAGNOSIS — G894 Chronic pain syndrome: Secondary | ICD-10-CM | POA: Diagnosis not present

## 2013-04-14 DIAGNOSIS — G5600 Carpal tunnel syndrome, unspecified upper limb: Secondary | ICD-10-CM | POA: Diagnosis not present

## 2013-04-14 DIAGNOSIS — G5710 Meralgia paresthetica, unspecified lower limb: Secondary | ICD-10-CM | POA: Diagnosis not present

## 2013-04-28 ENCOUNTER — Encounter: Payer: Self-pay | Admitting: Cardiovascular Disease

## 2013-04-29 ENCOUNTER — Encounter: Payer: Self-pay | Admitting: Cardiovascular Disease

## 2013-04-29 ENCOUNTER — Ambulatory Visit (INDEPENDENT_AMBULATORY_CARE_PROVIDER_SITE_OTHER): Payer: Medicare Other | Admitting: Cardiovascular Disease

## 2013-04-29 VITALS — BP 118/72 | HR 55 | Ht 66.0 in | Wt 192.0 lb

## 2013-04-29 DIAGNOSIS — I2510 Atherosclerotic heart disease of native coronary artery without angina pectoris: Secondary | ICD-10-CM | POA: Diagnosis not present

## 2013-04-29 DIAGNOSIS — Z95820 Peripheral vascular angioplasty status with implants and grafts: Secondary | ICD-10-CM

## 2013-04-29 DIAGNOSIS — Z79899 Other long term (current) drug therapy: Secondary | ICD-10-CM

## 2013-04-29 DIAGNOSIS — Z9889 Other specified postprocedural states: Secondary | ICD-10-CM

## 2013-04-29 MED ORDER — CLOPIDOGREL BISULFATE 75 MG PO TABS: 75.0000 mg | ORAL_TABLET | Freq: Every day | ORAL | Status: DC

## 2013-04-29 NOTE — Assessment & Plan Note (Addendum)
Status post cardiac catheterization performed myself 12/23/12 revealing 90% mid AV groove circumflex stenosis which I stented with a mini vision bare metal stent. He had not otter RCA disease and normal LV function. He was placed on blood was seen back in the office a month later with what appeared to be angioedema probably related to ACE inhibitor which was discontinued. He was seen a month after that and was doing better. Since that time his denied chest pain or shortness of breath and he still smoking.tentative for months and has been procedure I'm going to stop Brilenta, and begin him on Plavix. I will then obtain Verify Now P2Y12  test to assess Plavix effectiveness.

## 2013-04-29 NOTE — Progress Notes (Signed)
04/29/2013 Rosanna Randy   08/09/1946  [id number]  Primary Physician Benita Stabile, MD Primary Cardiologist: Runell Gess MD Roseanne Reno   HPI:  The patient is a 67 year old, overweight Caucasian male with history of coronary artery disease with moderate coronary artery disease by prior cath in 2005. He had a diagnostic cath December 23, 2012, which revealed 90% circumflex stenosis, 50% to 60% RCA stenosis in the mid vessel. He had a normal LAD and normal left main. His ejection fraction at that time was 60% without wall motion abnormality. He underwent bare-metal stenting of the circumflex. He had an issue with hematochezia during admission or complained of it. He is doing well today but at his previous office visit on January 06, 2013, he was complaining of some angioedema and rash on his face as well as some facial swelling. At that time, he had been started on lisinopril and Brilinta. Lisinopril was stopped by Corine Shelter, and so I am seeing him today in followup. The patient still has a little bit of redness in his face; however, the angioedema and periorbital edema has essentially cleared up. He denies any nausea, vomiting, shortness of breath, dizziness, orthopnea, chest pain, lower extremity edema, facial edema. He does apparently has some pain after eating in the epigastric region, particularly if he eats a large piece of meat. He said he can eat 3 ounces of meat and then he is okay but if he eats 6 ounces of meat he has pain that feels like it is blocking up his esophagus. Since he was seen 3 months ago he denies chest pain or shortness of breath. He is still smoking. A fasting lipid profile performed during his observation we'll cholesterol was 64, LDL 94 HDL 40.    Current Outpatient Prescriptions  Medication Sig Dispense Refill  . aspirin EC 81 MG tablet Take 81 mg by mouth daily.      Marland Kitchen esomeprazole (NEXIUM) 40 MG capsule Take 40 mg by mouth daily as needed.  For acid indigestion from spicy food      . fenofibrate (TRICOR) 48 MG tablet Take 48 mg by mouth daily.      Marland Kitchen HYDROcodone-acetaminophen (NORCO) 10-325 MG per tablet Take 1 tablet by mouth every 6 (six) hours as needed for pain.      . metoprolol succinate (TOPROL-XL) 50 MG 24 hr tablet Take 50 mg by mouth 2 (two) times daily. Take with or immediately following a meal.      . nitroGLYCERIN (NITROSTAT) 0.4 MG SL tablet Place 1 tablet (0.4 mg total) under the tongue every 5 (five) minutes x 3 doses as needed for chest pain.  25 tablet  2  . oxyCODONE (OXYCONTIN) 20 MG 12 hr tablet Take 20 mg by mouth 3 (three) times daily.      . rosuvastatin (CRESTOR) 20 MG tablet Take 20 mg by mouth daily.       No current facility-administered medications for this visit.    Allergies  Allergen Reactions  . Lisinopril Rash and Other (See Comments)    Swelling of face  . Antihistamines, Chlorpheniramine-Type Itching  . Cortisone     MI  . Flexeril (Cyclobenzaprine) Itching    History   Social History  . Marital Status: Married    Spouse Name: N/A    Number of Children: N/A  . Years of Education: N/A   Occupational History  . Not on file.   Social History Main Topics  . Smoking status: Current  Every Day Smoker -- 0.25 packs/day for 50 years    Types: Cigarettes  . Smokeless tobacco: Never Used  . Alcohol Use: No  . Drug Use: No  . Sexually Active: Not on file   Other Topics Concern  . Not on file   Social History Narrative  . No narrative on file     Review of Systems: General: negative for chills, fever, night sweats or weight changes.  Cardiovascular: negative for chest pain, dyspnea on exertion, edema, orthopnea, palpitations, paroxysmal nocturnal dyspnea or shortness of breath Dermatological: negative for rash Respiratory: negative for cough or wheezing Urologic: negative for hematuria Abdominal: negative for nausea, vomiting, diarrhea, bright red blood per rectum, melena, or  hematemesis Neurologic: negative for visual changes, syncope, or dizziness All other systems reviewed and are otherwise negative except as noted above.    Blood pressure 118/72, pulse 55, height 5\' 6"  (1.676 m), weight 192 lb (87.091 kg).  General appearance: alert and no distress Neck: no adenopathy, no carotid bruit, no JVD, supple, symmetrical, trachea midline and thyroid not enlarged, symmetric, no tenderness/mass/nodules Lungs: clear to auscultation bilaterally Heart: regular rate and rhythm, S1, S2 normal, no murmur, click, rub or gallop Extremities: extremities normal, atraumatic, no cyanosis or edema  EKG sinus bradycardia of 59 without ST or T wave changes  ASSESSMENT AND PLAN:   S/P angioplasty with stent, BMS to LCX  12/23/12 Status post cardiac catheterization performed myself 12/23/12 revealing 90% mid AV groove circumflex stenosis which I stented with a mini vision bare metal stent. He had not otter RCA disease and normal LV function. He was placed on blood was seen back in the office a month later with what appeared to be angioedema probably related to ACE inhibitor which was discontinued. He was seen a month after that and was doing better. Since that time his denied chest pain or shortness of breath and he still smoking.      Runell Gess MD FACP,FACC,FAHA, [facility] 04/29/2013 5:33 PM

## 2013-04-29 NOTE — Patient Instructions (Addendum)
Your physician wants you to follow-up in: 6 months with an extender and 12 months with Dr Allyson Sabal. You will receive a reminder letter in the mail two months in advance. If you don't receive a letter, please call our office to schedule the follow-up appointment.  After you finish this bottle of Brilinta, stop the Brilinta.  When you stop the Brilinta, start Plavix (I sent the prescription into [facility]).  After you have been on the Plavix for 2 WEEKS, go to [facility] and have bloodwork done to check to see if the plavix is working.

## 2013-05-06 DIAGNOSIS — T6391XA Toxic effect of contact with unspecified venomous animal, accidental (unintentional), initial encounter: Secondary | ICD-10-CM | POA: Diagnosis not present

## 2013-05-06 DIAGNOSIS — L570 Actinic keratosis: Secondary | ICD-10-CM | POA: Diagnosis not present

## 2013-05-12 DIAGNOSIS — M47817 Spondylosis without myelopathy or radiculopathy, lumbosacral region: Secondary | ICD-10-CM | POA: Diagnosis not present

## 2013-05-18 DIAGNOSIS — R42 Dizziness and giddiness: Secondary | ICD-10-CM | POA: Diagnosis not present

## 2013-05-22 DIAGNOSIS — R42 Dizziness and giddiness: Secondary | ICD-10-CM | POA: Diagnosis not present

## 2013-06-15 DIAGNOSIS — D235 Other benign neoplasm of skin of trunk: Secondary | ICD-10-CM | POA: Diagnosis not present

## 2013-06-15 DIAGNOSIS — L570 Actinic keratosis: Secondary | ICD-10-CM | POA: Diagnosis not present

## 2013-06-15 DIAGNOSIS — D485 Neoplasm of uncertain behavior of skin: Secondary | ICD-10-CM | POA: Diagnosis not present

## 2013-06-16 DIAGNOSIS — Z79899 Other long term (current) drug therapy: Secondary | ICD-10-CM | POA: Diagnosis not present

## 2013-06-16 DIAGNOSIS — M47817 Spondylosis without myelopathy or radiculopathy, lumbosacral region: Secondary | ICD-10-CM | POA: Diagnosis not present

## 2013-06-16 DIAGNOSIS — G894 Chronic pain syndrome: Secondary | ICD-10-CM | POA: Diagnosis not present

## 2013-06-24 ENCOUNTER — Other Ambulatory Visit: Payer: Self-pay | Admitting: *Deleted

## 2013-06-24 ENCOUNTER — Telehealth: Payer: Self-pay | Admitting: Cardiovascular Disease

## 2013-06-24 NOTE — Telephone Encounter (Signed)
Returned call.  Pt stated he went to get the test and they told him they couldn't do it unless he was admitted.  Pt informed [facility] lab is the only lab in the area that can draw this particular lab and RN will check on alternate way to order so he can have it done.  Pt verbalized understanding and agreed w/ plan.  Samara Deist, RN w/ Dr. Allyson Sabal notified and advised reorder for inpatient lab.  Lab reordered and pt notified.  Pt also advised to have lab call office if any questions or concerns when he returns as he has a 45 min drive to [place].  Pt verbalized understanding and agreed w/ plan.

## 2013-06-24 NOTE — Telephone Encounter (Signed)
Please call-had a lab order to go to [facility]-they would not do it.

## 2013-06-29 ENCOUNTER — Other Ambulatory Visit (HOSPITAL_COMMUNITY): Payer: Self-pay | Admitting: Cardiology

## 2013-06-30 ENCOUNTER — Ambulatory Visit (HOSPITAL_COMMUNITY)
Admission: AD | Admit: 2013-06-30 | Discharge: 2013-06-30 | Disposition: A | Discharge status: Home / Self Care | Payer: Medicare Other | Source: Ambulatory Visit | Attending: Cardiovascular Disease | Admitting: Cardiovascular Disease

## 2013-06-30 DIAGNOSIS — I2510 Atherosclerotic heart disease of native coronary artery without angina pectoris: Secondary | ICD-10-CM | POA: Diagnosis not present

## 2013-06-30 DIAGNOSIS — E785 Hyperlipidemia, unspecified: Secondary | ICD-10-CM | POA: Insufficient documentation

## 2013-06-30 DIAGNOSIS — Z79899 Other long term (current) drug therapy: Secondary | ICD-10-CM | POA: Diagnosis not present

## 2013-06-30 DIAGNOSIS — I10 Essential (primary) hypertension: Secondary | ICD-10-CM | POA: Diagnosis not present

## 2013-06-30 DIAGNOSIS — F17200 Nicotine dependence, unspecified, uncomplicated: Secondary | ICD-10-CM | POA: Insufficient documentation

## 2013-06-30 DIAGNOSIS — Z9861 Coronary angioplasty status: Secondary | ICD-10-CM | POA: Diagnosis not present

## 2013-06-30 LAB — PLATELET INHIBITION P2Y12: Platelet Function  P2Y12: 171 [PRU] — ABNORMAL LOW (ref 194–418)

## 2013-07-02 ENCOUNTER — Encounter: Payer: Self-pay | Admitting: *Deleted

## 2013-07-21 DIAGNOSIS — Z79899 Other long term (current) drug therapy: Secondary | ICD-10-CM | POA: Diagnosis not present

## 2013-07-21 DIAGNOSIS — M47817 Spondylosis without myelopathy or radiculopathy, lumbosacral region: Secondary | ICD-10-CM | POA: Diagnosis not present

## 2013-07-21 DIAGNOSIS — G894 Chronic pain syndrome: Secondary | ICD-10-CM | POA: Diagnosis not present

## 2013-07-21 DIAGNOSIS — M542 Cervicalgia: Secondary | ICD-10-CM | POA: Diagnosis not present

## 2013-08-18 DIAGNOSIS — Z79899 Other long term (current) drug therapy: Secondary | ICD-10-CM | POA: Diagnosis not present

## 2013-08-18 DIAGNOSIS — G5710 Meralgia paresthetica, unspecified lower limb: Secondary | ICD-10-CM | POA: Diagnosis not present

## 2013-08-18 DIAGNOSIS — G894 Chronic pain syndrome: Secondary | ICD-10-CM | POA: Diagnosis not present

## 2013-08-18 DIAGNOSIS — M47817 Spondylosis without myelopathy or radiculopathy, lumbosacral region: Secondary | ICD-10-CM | POA: Diagnosis not present

## 2013-09-17 DIAGNOSIS — G894 Chronic pain syndrome: Secondary | ICD-10-CM | POA: Diagnosis not present

## 2013-09-17 DIAGNOSIS — Z79899 Other long term (current) drug therapy: Secondary | ICD-10-CM | POA: Diagnosis not present

## 2013-09-17 DIAGNOSIS — M25559 Pain in unspecified hip: Secondary | ICD-10-CM | POA: Diagnosis not present

## 2013-09-17 DIAGNOSIS — M47817 Spondylosis without myelopathy or radiculopathy, lumbosacral region: Secondary | ICD-10-CM | POA: Diagnosis not present

## 2013-10-15 DIAGNOSIS — M531 Cervicobrachial syndrome: Secondary | ICD-10-CM | POA: Diagnosis not present

## 2013-10-15 DIAGNOSIS — G894 Chronic pain syndrome: Secondary | ICD-10-CM | POA: Diagnosis not present

## 2013-10-15 DIAGNOSIS — M47817 Spondylosis without myelopathy or radiculopathy, lumbosacral region: Secondary | ICD-10-CM | POA: Diagnosis not present

## 2013-10-15 DIAGNOSIS — Z79899 Other long term (current) drug therapy: Secondary | ICD-10-CM | POA: Diagnosis not present

## 2013-10-26 ENCOUNTER — Encounter: Payer: Self-pay | Admitting: Cardiology

## 2013-10-26 ENCOUNTER — Ambulatory Visit (INDEPENDENT_AMBULATORY_CARE_PROVIDER_SITE_OTHER): Payer: Medicare Other | Admitting: Cardiology

## 2013-10-26 VITALS — BP 158/84 | HR 66 | Ht 66.0 in | Wt 201.0 lb

## 2013-10-26 DIAGNOSIS — I2510 Atherosclerotic heart disease of native coronary artery without angina pectoris: Secondary | ICD-10-CM | POA: Diagnosis not present

## 2013-10-26 DIAGNOSIS — G5601 Carpal tunnel syndrome, right upper limb: Secondary | ICD-10-CM

## 2013-10-26 DIAGNOSIS — F17200 Nicotine dependence, unspecified, uncomplicated: Secondary | ICD-10-CM | POA: Diagnosis not present

## 2013-10-26 DIAGNOSIS — E785 Hyperlipidemia, unspecified: Secondary | ICD-10-CM

## 2013-10-26 DIAGNOSIS — I10 Essential (primary) hypertension: Secondary | ICD-10-CM

## 2013-10-26 DIAGNOSIS — G5600 Carpal tunnel syndrome, unspecified upper limb: Secondary | ICD-10-CM

## 2013-10-26 DIAGNOSIS — M549 Dorsalgia, unspecified: Secondary | ICD-10-CM

## 2013-10-26 DIAGNOSIS — Z720 Tobacco use: Secondary | ICD-10-CM

## 2013-10-26 MED ORDER — IRBESARTAN 150 MG PO TABS: 150.0000 mg | ORAL_TABLET | Freq: Every day | ORAL | Status: DC

## 2013-10-26 NOTE — Assessment & Plan Note (Addendum)
PCP follows, will obtain copy Last done in March will repeat.

## 2013-10-26 NOTE — Assessment & Plan Note (Signed)
Continues to smoke 3-4 cigarettes a day, Have reviewed importance of stopping

## 2013-10-26 NOTE — Assessment & Plan Note (Signed)
Continues with back and hip pain.

## 2013-10-26 NOTE — Assessment & Plan Note (Signed)
Elevated and he reports elevation for several months will add avapro 150 mg daily.

## 2013-10-26 NOTE — Progress Notes (Signed)
10/26/2013   PCP: Lupe Carney, MD   Chief Complaint  Patient presents with  . Follow-up    6 month visit    Primary Cardiologist: Dr. Allyson Sabal  HPI:  67 year old, overweight Caucasian male with history of coronary artery disease with moderate coronary artery disease by prior cath in 2005. He had a diagnostic cath December 23, 2012, which revealed 90% circumflex stenosis, 50% to 60% RCA stenosis in the mid vessel. He had a normal LAD and normal left main. His ejection fraction at that time was 60% without wall motion abnormality. He underwent bare-metal stenting of the circumflex. He had an issue with hematochezia during admission or complained of it. He is doing well today but at his previous office visit on January 06, 2013, he was complaining of some angioedema and rash on his face as well as some facial swelling. At that time, he had been started on lisinopril and Brilinta. Lisinopril was stopped   He denies any nausea, vomiting, shortness of breath, dizziness, orthopnea, chest pain, lower extremity edema, facial edema.  He denies chest pain or shortness of breath.  Though on occ when walking to the mailbox he has brief lt ant chest pressure.  Resolves quickly.  It does not occur with leaf blowing.   He is still smoking 3-5 cigarettes a day. Previous lipid profile:  cholesterol was 64, LDL 94 HDL 40.  He has not had once since March.  His greatest complaint is back pain and hip pain that is chronic.  He also has carpal tunnel syndrome on the right that will need surgery in the future.  I explained that we would prefer a stress test before surgery.   He will call us and schedule.     Allergies  Allergen Reactions  . Lisinopril Rash and Other (See Comments)    Swelling of face  . Antihistamines, Chlorpheniramine-Type Itching  . Cortisone     MI  . Flexeril [Cyclobenzaprine] Itching    Current Outpatient Prescriptions  Medication Sig Dispense Refill  . aspirin EC  81 MG tablet Take 81 mg by mouth daily.      . clopidogrel (PLAVIX) 75 MG tablet Take 1 tablet (75 mg total) by mouth daily.  30 tablet  6  . esomeprazole (NEXIUM) 40 MG capsule Take 40 mg by mouth daily as needed. For acid indigestion from spicy food      . fenofibrate (TRICOR) 48 MG tablet Take 48 mg by mouth daily.      Marland Kitchen HYDROcodone-acetaminophen (NORCO) 10-325 MG per tablet Take 1 tablet by mouth every 6 (six) hours as needed for pain.      . nitroGLYCERIN (NITROSTAT) 0.4 MG SL tablet Place 1 tablet (0.4 mg total) under the tongue every 5 (five) minutes x 3 doses as needed for chest pain.  25 tablet  2  . oxyCODONE (OXYCONTIN) 20 MG 12 hr tablet Take 20 mg by mouth 3 (three) times daily.      . rosuvastatin (CRESTOR) 20 MG tablet Take 20 mg by mouth daily.      . irbesartan (AVAPRO) 150 MG tablet Take 1 tablet (150 mg total) by mouth daily.  30 tablet  6  . metoprolol (LOPRESSOR) 50 MG tablet TAKE 1 TABLET BY MOUTH TWICE DAILY  60 tablet  5   No current facility-administered medications for this visit.    Past Medical History  Diagnosis Date  . Coronary artery disease  2D ECHO, 10/31/2010 - EF >55%, normal; NUCLEAR STRESS TEST, 10/23/2010 - perfusion defect in inferior myocardial region, post-stress EF 62%, EKG negative for ischemia  . Hypertension   . Hyperlipemia   . Shortness of breath   . Chronic back pain   . DVT (deep venous thrombosis)     history 2004 after knee surg  . BPH (benign prostatic hyperplasia)   . Arthritis   . Neuromuscular disorder   . Carpal tunnel syndrome   . 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 GERD (gastroesophageal reflux disease)   . 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 S/P angioplasty with stent, BMS to LCX  12/23/12 12/23/2012  . Back pain 12/23/2012    Past Surgical History  Procedure Laterality Date  . Knee arthroscopy  04,06    left  . Joint replacement  2008    lt total knee  . Manipulation knee joint  2009    closed lt  knee   . Hernia repair  2008    umb   . Cervical fusion  1999  . Back surgery  2004    lumb fusion  . Epidural block injection      multiple lumbar  . Carpal tunnel release  04/15/2012    Procedure: CARPAL TUNNEL RELEASE;  Surgeon: Nicki Reaper, MD;  Location: [facility];  Service: Orthopedics;  Laterality: Left;  . Trigger finger release  04/15/2012    Procedure: RELEASE TRIGGER FINGER/A-1 PULLEY;  Surgeon: Nicki Reaper, MD;  Location: [facility];  Service: Orthopedics;  Laterality: Left;  left thumb and little finger  . Cardiac catheterization  12/23/2012    Mid nondominant AV groove circumflex stented with a 2.5x16mm Mini Vision stent resulting in a reduction of 90% stenosis to 0% residual  . Cardiac catheterization  02/07/2004    Noncritical CAD, continue medical therapy  . Cardiac catheterization  02/03/1999    Recommended medical therapy    WUJ:WJXBJYN:WG colds or fevers, no weight changes Skin:no rashes or ulcers HEENT:no blurred vision, no congestion CV:see HPI PUL:see HPI GI:no diarrhea constipation or melena, no indigestion GU:no hematuria, no dysuria MS:+ hip joint pain, no claudication, + back pain Neuro:no syncope, no lightheadedness Endo:no diabetes, no thyroid disease  PHYSICAL EXAM BP 158/84  Pulse 66  Ht 5\' 6"  (1.676 m)  Wt 201 lb (91.173 kg)  BMI 32.46 kg/m2 General:Pleasant affect, NAD Skin:Warm and dry, brisk capillary refill HEENT:normocephalic, sclera clear, mucus membranes moist Neck:supple, no JVD, no bruits  Heart:S1S2 RRR without murmur, gallup, rub or click Lungs:clear without rales, rhonchi, or wheezes NFA:OZHY, non tender, + BS, do not palpate liver spleen or masses Ext:no lower ext edema, 2+ pedal pulses, 2+ radial pulses Neuro:alert and oriented, MAE, follows commands, + facial symmetry  EKG:SR no acute changes.  ASSESSMENT AND PLAN CAD (coronary artery disease), cath 2005 with non obstructive disease, now 12/2102  with LCX stenosis culprit vessel occ mild lt ant chest discomfort that is brief, none with strenuous activity such as leaf blowing.  Tobacco abuse Continues to smoke 3-4 cigarettes a day, Have reviewed importance of stopping   Dyslipidemia PCP follows, will obtain copy Last done in March will repeat.  HTN (hypertension) Elevated and he reports elevation for several months will add avapro 150 mg daily.  Back pain, followed at [facility] pain clinic Continues with back and hip pain.    Carpal tunnel syndrome of right wrist, may need surgery  in near future Pt will schedule surgery and then we will order lexiscan myoview, he is aware of need to give [place] several weeks notice.

## 2013-10-26 NOTE — Patient Instructions (Addendum)
We will arrange a lexiscan myoview prior to any surgery you have just call Andrew Shaw a month or so before the surgical date to arrange.  I am starting Avapro 150 mg daily to help with your BP.  Call if any chest pain or SOB.  Follow up with Dr. Allyson Sabal in 6 months  Have Labs done

## 2013-10-26 NOTE — Assessment & Plan Note (Signed)
Pt will schedule surgery and then we will order lexiscan myoview, he is aware of need to give [place] several weeks notice.

## 2013-10-26 NOTE — Assessment & Plan Note (Signed)
occ mild lt ant chest discomfort that is brief, none with strenuous activity such as leaf blowing.

## 2013-11-17 DIAGNOSIS — M47817 Spondylosis without myelopathy or radiculopathy, lumbosacral region: Secondary | ICD-10-CM | POA: Diagnosis not present

## 2013-11-17 DIAGNOSIS — IMO0002 Reserved for concepts with insufficient information to code with codable children: Secondary | ICD-10-CM | POA: Diagnosis not present

## 2013-11-17 DIAGNOSIS — G894 Chronic pain syndrome: Secondary | ICD-10-CM | POA: Diagnosis not present

## 2013-11-17 DIAGNOSIS — M25559 Pain in unspecified hip: Secondary | ICD-10-CM | POA: Diagnosis not present

## 2013-12-08 ENCOUNTER — Other Ambulatory Visit: Payer: Self-pay | Admitting: Cardiovascular Disease

## 2013-12-08 NOTE — Telephone Encounter (Signed)
Rx was sent to pharmacy electronically. 

## 2013-12-14 DIAGNOSIS — G894 Chronic pain syndrome: Secondary | ICD-10-CM | POA: Diagnosis not present

## 2013-12-14 DIAGNOSIS — Z79899 Other long term (current) drug therapy: Secondary | ICD-10-CM | POA: Diagnosis not present

## 2013-12-14 DIAGNOSIS — M47817 Spondylosis without myelopathy or radiculopathy, lumbosacral region: Secondary | ICD-10-CM | POA: Diagnosis not present

## 2013-12-20 ENCOUNTER — Other Ambulatory Visit (HOSPITAL_COMMUNITY): Payer: Self-pay | Admitting: Cardiovascular Disease

## 2014-01-11 DIAGNOSIS — M25569 Pain in unspecified knee: Secondary | ICD-10-CM | POA: Diagnosis not present

## 2014-01-11 DIAGNOSIS — M47817 Spondylosis without myelopathy or radiculopathy, lumbosacral region: Secondary | ICD-10-CM | POA: Diagnosis not present

## 2014-01-11 DIAGNOSIS — G894 Chronic pain syndrome: Secondary | ICD-10-CM | POA: Diagnosis not present

## 2014-01-11 DIAGNOSIS — Z79899 Other long term (current) drug therapy: Secondary | ICD-10-CM | POA: Diagnosis not present

## 2014-01-26 DIAGNOSIS — R5381 Other malaise: Secondary | ICD-10-CM | POA: Diagnosis not present

## 2014-01-26 DIAGNOSIS — M159 Polyosteoarthritis, unspecified: Secondary | ICD-10-CM | POA: Diagnosis not present

## 2014-01-26 DIAGNOSIS — R5383 Other fatigue: Secondary | ICD-10-CM | POA: Diagnosis not present

## 2014-01-26 DIAGNOSIS — Z125 Encounter for screening for malignant neoplasm of prostate: Secondary | ICD-10-CM | POA: Diagnosis not present

## 2014-01-26 DIAGNOSIS — I2510 Atherosclerotic heart disease of native coronary artery without angina pectoris: Secondary | ICD-10-CM | POA: Diagnosis not present

## 2014-01-26 DIAGNOSIS — Z0000 Encounter for general adult medical examination without abnormal findings: Secondary | ICD-10-CM | POA: Diagnosis not present

## 2014-01-26 DIAGNOSIS — E782 Mixed hyperlipidemia: Secondary | ICD-10-CM | POA: Diagnosis not present

## 2014-01-26 DIAGNOSIS — L821 Other seborrheic keratosis: Secondary | ICD-10-CM | POA: Diagnosis not present

## 2014-02-10 DIAGNOSIS — Z79899 Other long term (current) drug therapy: Secondary | ICD-10-CM | POA: Diagnosis not present

## 2014-02-10 DIAGNOSIS — M47817 Spondylosis without myelopathy or radiculopathy, lumbosacral region: Secondary | ICD-10-CM | POA: Diagnosis not present

## 2014-02-10 DIAGNOSIS — M542 Cervicalgia: Secondary | ICD-10-CM | POA: Diagnosis not present

## 2014-02-10 DIAGNOSIS — G894 Chronic pain syndrome: Secondary | ICD-10-CM | POA: Diagnosis not present

## 2014-02-11 DIAGNOSIS — L820 Inflamed seborrheic keratosis: Secondary | ICD-10-CM | POA: Diagnosis not present

## 2014-02-11 DIAGNOSIS — D485 Neoplasm of uncertain behavior of skin: Secondary | ICD-10-CM | POA: Diagnosis not present

## 2014-02-11 DIAGNOSIS — L578 Other skin changes due to chronic exposure to nonionizing radiation: Secondary | ICD-10-CM | POA: Diagnosis not present

## 2014-03-10 ENCOUNTER — Other Ambulatory Visit: Payer: Self-pay | Admitting: Anesthesiology

## 2014-03-10 DIAGNOSIS — M545 Low back pain, unspecified: Secondary | ICD-10-CM

## 2014-03-10 DIAGNOSIS — M47817 Spondylosis without myelopathy or radiculopathy, lumbosacral region: Secondary | ICD-10-CM | POA: Diagnosis not present

## 2014-03-10 DIAGNOSIS — IMO0002 Reserved for concepts with insufficient information to code with codable children: Secondary | ICD-10-CM | POA: Diagnosis not present

## 2014-03-10 DIAGNOSIS — G894 Chronic pain syndrome: Secondary | ICD-10-CM | POA: Diagnosis not present

## 2014-03-10 DIAGNOSIS — Z79899 Other long term (current) drug therapy: Secondary | ICD-10-CM | POA: Diagnosis not present

## 2014-03-11 ENCOUNTER — Ambulatory Visit
Admission: RE | Admit: 2014-03-11 | Discharge: 2014-03-11 | Disposition: A | Discharge status: Home / Self Care | Payer: Medicare Other | Source: Ambulatory Visit | Attending: Anesthesiology | Admitting: Anesthesiology

## 2014-03-11 DIAGNOSIS — M48061 Spinal stenosis, lumbar region without neurogenic claudication: Secondary | ICD-10-CM | POA: Diagnosis not present

## 2014-03-11 DIAGNOSIS — M545 Low back pain, unspecified: Secondary | ICD-10-CM

## 2014-03-11 MED ORDER — GADOBENATE DIMEGLUMINE 529 MG/ML IV SOLN
18.0000 mL | Freq: Once | INTRAVENOUS | Status: AC | PRN
  Administered 2014-03-11: 18 mL via INTRAVENOUS

## 2014-04-07 DIAGNOSIS — G894 Chronic pain syndrome: Secondary | ICD-10-CM | POA: Diagnosis not present

## 2014-04-07 DIAGNOSIS — Z79899 Other long term (current) drug therapy: Secondary | ICD-10-CM | POA: Diagnosis not present

## 2014-04-07 DIAGNOSIS — M47817 Spondylosis without myelopathy or radiculopathy, lumbosacral region: Secondary | ICD-10-CM | POA: Diagnosis not present

## 2014-04-26 ENCOUNTER — Ambulatory Visit (INDEPENDENT_AMBULATORY_CARE_PROVIDER_SITE_OTHER): Payer: Medicare Other | Admitting: Cardiovascular Disease

## 2014-04-26 ENCOUNTER — Encounter: Payer: Self-pay | Admitting: Cardiovascular Disease

## 2014-04-26 VITALS — BP 132/60 | HR 63 | Ht 66.0 in | Wt 200.0 lb

## 2014-04-26 DIAGNOSIS — I10 Essential (primary) hypertension: Secondary | ICD-10-CM

## 2014-04-26 DIAGNOSIS — E785 Hyperlipidemia, unspecified: Secondary | ICD-10-CM | POA: Diagnosis not present

## 2014-04-26 DIAGNOSIS — I2510 Atherosclerotic heart disease of native coronary artery without angina pectoris: Secondary | ICD-10-CM

## 2014-04-26 NOTE — Assessment & Plan Note (Signed)
Well-controlled on current medications 

## 2014-04-26 NOTE — Assessment & Plan Note (Signed)
Status post circumflex stenting 12/23/12. He did have a 50-60% mid RCA stenosis, normal LAD left main and normal LV function. He denies chest pain or shortness of breath. He is on dual antiplatelet therapy.

## 2014-04-26 NOTE — Progress Notes (Signed)
04/26/2014 Andrew Shaw   [birth year]  [id number]  Primary Physician Andrew Coffin, MD Primary Cardiologist: Lorretta Harp MD Renae Gloss   HPI:  The patient is a 68 year old, overweight Caucasian male with history of coronary artery disease with moderate coronary artery disease by prior cath in 2005. He had a diagnostic cath December 23, 2012, which revealed 90% circumflex stenosis, 50% to 60% RCA stenosis in the mid vessel. He had a normal LAD and normal left main. His ejection fraction at that time was 60% without wall motion abnormality. He underwent bare-metal stenting of the circumflex. He had an issue with hematochezia during admission or complained of it. He is doing well today but at his previous office visit on January 06, 2013, he was complaining of some angioedema and rash on his face as well as some facial swelling. At that time, he had been started on lisinopril and Brilinta. Lisinopril was stopped by Kerin Ransom, and so I am seeing him today in followup. The patient still has a little bit of redness in his face; however, the angioedema and periorbital edema has essentially cleared up. He denies any nausea, vomiting, shortness of breath, dizziness, orthopnea, chest pain, lower extremity edema, facial edema. He does apparently has some pain after eating in the epigastric region, particularly if he eats a large piece of meat. He said he can eat 3 ounces of meat and then he is okay but if he eats 6 ounces of meat he has pain that feels like it is blocking up his esophagus. Since he was seen 3 months ago he denies chest pain or shortness of breath. He is still smoking. His primary care physician recently checked his lipid profile    Current Outpatient Prescriptions  Medication Sig Dispense Refill  . aspirin EC 81 MG tablet Take 81 mg by mouth daily.      . clopidogrel (PLAVIX) 75 MG tablet TAKE 1 TABLET BY MOUTH DAILY  30 tablet  11  . esomeprazole (NEXIUM) 40 MG  capsule Take 40 mg by mouth daily as needed. For acid indigestion from spicy food      . fenofibrate (TRICOR) 48 MG tablet Take 48 mg by mouth daily.      Marland Kitchen HYDROcodone-acetaminophen (NORCO) 10-325 MG per tablet Take 1 tablet by mouth every 6 (six) hours as needed for pain.      Marland Kitchen irbesartan (AVAPRO) 150 MG tablet Take 1 tablet (150 mg total) by mouth daily.  30 tablet  6  . metoprolol (LOPRESSOR) 50 MG tablet TAKE 1 TABLET BY MOUTH TWICE DAILY  60 tablet  4  . nitroGLYCERIN (NITROSTAT) 0.4 MG SL tablet Place 1 tablet (0.4 mg total) under the tongue every 5 (five) minutes x 3 doses as needed for chest pain.  25 tablet  2  . oxyCODONE (OXYCONTIN) 20 MG 12 hr tablet Take 20 mg by mouth 3 (three) times daily.      . rosuvastatin (CRESTOR) 20 MG tablet Take 20 mg by mouth daily.       No current facility-administered medications for this visit.    Allergies  Allergen Reactions  . Lisinopril Rash and Other (See Comments)    Swelling of face  . Antihistamines, Chlorpheniramine-Type Itching  . Cortisone     MI  . Flexeril [Cyclobenzaprine] Itching    History   Social History  . Marital Status: Married    Spouse Name: N/A    Number of Children: N/A  . Years of  Education: N/A   Occupational History  . Not on file.   Social History Main Topics  . Smoking status: Current Some Day Smoker -- 0.25 packs/day for 50 years    Types: Cigarettes  . Smokeless tobacco: Never Used  . Alcohol Use: No  . Drug Use: No  . Sexual Activity: Not on file   Other Topics Concern  . Not on file   Social History Narrative  . No narrative on file     Review of Systems: General: negative for chills, fever, night sweats or weight changes.  Cardiovascular: negative for chest pain, dyspnea on exertion, edema, orthopnea, palpitations, paroxysmal nocturnal dyspnea or shortness of breath Dermatological: negative for rash Respiratory: negative for cough or wheezing Urologic: negative for  hematuria Abdominal: negative for nausea, vomiting, diarrhea, bright red blood per rectum, melena, or hematemesis Neurologic: negative for visual changes, syncope, or dizziness All other systems reviewed and are otherwise negative except as noted above.    Blood pressure 132/60, pulse 63, height 5\' 6"  (1.676 m), weight 200 lb (90.719 kg).  General appearance: alert and no distress Neck: no adenopathy, no carotid bruit, no JVD, supple, symmetrical, trachea midline and thyroid not enlarged, symmetric, no tenderness/mass/nodules Lungs: clear to auscultation bilaterally Heart: regular rate and rhythm, S1, S2 normal, no murmur, click, rub or gallop Extremities: extremities normal, atraumatic, no cyanosis or edema  EKG normal sinus rhythm at 63 without ST or T wave changes  ASSESSMENT AND PLAN:   HTN (hypertension) Well-controlled on current medications  Dyslipidemia On statin therapy followed by his PCP  CAD (coronary artery disease), cath 2005 with non obstructive disease, now 12/2102 with LCX stenosis culprit vessel Status post circumflex stenting 12/23/12. He did have a 50-60% mid RCA stenosis, normal LAD left main and normal LV function. He denies chest pain or shortness of breath. He is on dual antiplatelet therapy.      Lorretta Harp MD FACP,FACC,FAHA, [facility] 04/26/2014 4:21 PM

## 2014-04-26 NOTE — Assessment & Plan Note (Signed)
On statin therapy followed by his PCP 

## 2014-04-26 NOTE — Patient Instructions (Signed)
Your physician wants you to follow-up in: 1 year with Dr Berry. You will receive a reminder letter in the mail two months in advance. If you don't receive a letter, please call our office to schedule the follow-up appointment.  

## 2014-05-17 ENCOUNTER — Other Ambulatory Visit: Payer: Self-pay | Admitting: Cardiology

## 2014-05-17 NOTE — Telephone Encounter (Signed)
Rx was sent to pharmacy electronically. 

## 2014-05-18 ENCOUNTER — Other Ambulatory Visit (HOSPITAL_COMMUNITY): Payer: Self-pay | Admitting: Cardiovascular Disease

## 2014-05-18 NOTE — Telephone Encounter (Signed)
Rx refill sent to patient pharmacy   

## 2014-06-03 DIAGNOSIS — IMO0002 Reserved for concepts with insufficient information to code with codable children: Secondary | ICD-10-CM | POA: Diagnosis not present

## 2014-06-03 DIAGNOSIS — M47817 Spondylosis without myelopathy or radiculopathy, lumbosacral region: Secondary | ICD-10-CM | POA: Diagnosis not present

## 2014-06-03 DIAGNOSIS — Z79899 Other long term (current) drug therapy: Secondary | ICD-10-CM | POA: Diagnosis not present

## 2014-06-03 DIAGNOSIS — G894 Chronic pain syndrome: Secondary | ICD-10-CM | POA: Diagnosis not present

## 2014-06-30 DIAGNOSIS — M47817 Spondylosis without myelopathy or radiculopathy, lumbosacral region: Secondary | ICD-10-CM | POA: Diagnosis not present

## 2014-06-30 DIAGNOSIS — G894 Chronic pain syndrome: Secondary | ICD-10-CM | POA: Diagnosis not present

## 2014-06-30 DIAGNOSIS — Z79899 Other long term (current) drug therapy: Secondary | ICD-10-CM | POA: Diagnosis not present

## 2014-07-14 DIAGNOSIS — L570 Actinic keratosis: Secondary | ICD-10-CM | POA: Diagnosis not present

## 2014-07-14 DIAGNOSIS — D485 Neoplasm of uncertain behavior of skin: Secondary | ICD-10-CM | POA: Diagnosis not present

## 2014-07-29 DIAGNOSIS — M47817 Spondylosis without myelopathy or radiculopathy, lumbosacral region: Secondary | ICD-10-CM | POA: Diagnosis not present

## 2014-07-29 DIAGNOSIS — Z79899 Other long term (current) drug therapy: Secondary | ICD-10-CM | POA: Diagnosis not present

## 2014-07-29 DIAGNOSIS — G5600 Carpal tunnel syndrome, unspecified upper limb: Secondary | ICD-10-CM | POA: Diagnosis not present

## 2014-07-29 DIAGNOSIS — G894 Chronic pain syndrome: Secondary | ICD-10-CM | POA: Diagnosis not present

## 2014-08-26 DIAGNOSIS — M47812 Spondylosis without myelopathy or radiculopathy, cervical region: Secondary | ICD-10-CM | POA: Diagnosis not present

## 2014-08-26 DIAGNOSIS — M5481 Occipital neuralgia: Secondary | ICD-10-CM | POA: Diagnosis not present

## 2014-08-26 DIAGNOSIS — M4726 Other spondylosis with radiculopathy, lumbar region: Secondary | ICD-10-CM | POA: Diagnosis not present

## 2014-08-26 DIAGNOSIS — G894 Chronic pain syndrome: Secondary | ICD-10-CM | POA: Diagnosis not present

## 2014-09-25 ENCOUNTER — Other Ambulatory Visit: Payer: Self-pay | Admitting: Cardiovascular Disease

## 2014-09-27 NOTE — Telephone Encounter (Signed)
Rx refill sent to patient pharmacy   

## 2014-09-28 DIAGNOSIS — M47812 Spondylosis without myelopathy or radiculopathy, cervical region: Secondary | ICD-10-CM | POA: Diagnosis not present

## 2014-09-28 DIAGNOSIS — G894 Chronic pain syndrome: Secondary | ICD-10-CM | POA: Diagnosis not present

## 2014-09-28 DIAGNOSIS — M5481 Occipital neuralgia: Secondary | ICD-10-CM | POA: Diagnosis not present

## 2014-09-28 DIAGNOSIS — M4726 Other spondylosis with radiculopathy, lumbar region: Secondary | ICD-10-CM | POA: Diagnosis not present

## 2014-10-21 ENCOUNTER — Encounter (HOSPITAL_COMMUNITY): Payer: Self-pay | Admitting: Cardiovascular Disease

## 2014-10-28 DIAGNOSIS — G894 Chronic pain syndrome: Secondary | ICD-10-CM | POA: Diagnosis not present

## 2014-10-28 DIAGNOSIS — M4726 Other spondylosis with radiculopathy, lumbar region: Secondary | ICD-10-CM | POA: Diagnosis not present

## 2014-10-28 DIAGNOSIS — M5481 Occipital neuralgia: Secondary | ICD-10-CM | POA: Diagnosis not present

## 2014-10-28 DIAGNOSIS — M47812 Spondylosis without myelopathy or radiculopathy, cervical region: Secondary | ICD-10-CM | POA: Diagnosis not present

## 2014-11-29 DIAGNOSIS — M47812 Spondylosis without myelopathy or radiculopathy, cervical region: Secondary | ICD-10-CM | POA: Diagnosis not present

## 2014-11-29 DIAGNOSIS — M4726 Other spondylosis with radiculopathy, lumbar region: Secondary | ICD-10-CM | POA: Diagnosis not present

## 2014-11-29 DIAGNOSIS — M5481 Occipital neuralgia: Secondary | ICD-10-CM | POA: Diagnosis not present

## 2014-11-29 DIAGNOSIS — G894 Chronic pain syndrome: Secondary | ICD-10-CM | POA: Diagnosis not present

## 2014-12-07 ENCOUNTER — Other Ambulatory Visit: Payer: Self-pay | Admitting: Cardiovascular Disease

## 2014-12-07 NOTE — Telephone Encounter (Signed)
Rx(s) sent to pharmacy electronically.  

## 2014-12-14 DIAGNOSIS — L308 Other specified dermatitis: Secondary | ICD-10-CM | POA: Diagnosis not present

## 2014-12-14 DIAGNOSIS — L570 Actinic keratosis: Secondary | ICD-10-CM | POA: Diagnosis not present

## 2014-12-14 DIAGNOSIS — D485 Neoplasm of uncertain behavior of skin: Secondary | ICD-10-CM | POA: Diagnosis not present

## 2014-12-14 DIAGNOSIS — C44622 Squamous cell carcinoma of skin of right upper limb, including shoulder: Secondary | ICD-10-CM | POA: Diagnosis not present

## 2014-12-14 DIAGNOSIS — C44692 Other specified malignant neoplasm of skin of right upper limb, including shoulder: Secondary | ICD-10-CM | POA: Diagnosis not present

## 2014-12-14 DIAGNOSIS — L249 Irritant contact dermatitis, unspecified cause: Secondary | ICD-10-CM | POA: Diagnosis not present

## 2014-12-14 DIAGNOSIS — L820 Inflamed seborrheic keratosis: Secondary | ICD-10-CM | POA: Diagnosis not present

## 2014-12-28 DIAGNOSIS — M47812 Spondylosis without myelopathy or radiculopathy, cervical region: Secondary | ICD-10-CM | POA: Diagnosis not present

## 2014-12-28 DIAGNOSIS — G894 Chronic pain syndrome: Secondary | ICD-10-CM | POA: Diagnosis not present

## 2014-12-28 DIAGNOSIS — M4726 Other spondylosis with radiculopathy, lumbar region: Secondary | ICD-10-CM | POA: Diagnosis not present

## 2014-12-28 DIAGNOSIS — M5481 Occipital neuralgia: Secondary | ICD-10-CM | POA: Diagnosis not present

## 2015-01-07 ENCOUNTER — Telehealth: Payer: Self-pay | Admitting: Cardiovascular Disease

## 2015-01-07 NOTE — Telephone Encounter (Signed)
Mr. Hazen is calling because his bp have been going up and down. Please call   Thanks

## 2015-01-07 NOTE — Telephone Encounter (Signed)
Returned call to patient he stated B/P has been elevated 174/72,155/80,145/80,did not check pulse.Stated he feels weak,nausea.Stated he would like to see Dr.Berry.Appointment scheduled with Dr.Berry 01/14/15 at 2:00 pm.Advsied to decrease salt,continue to monitor B/P and bring reading to appointment.

## 2015-01-08 ENCOUNTER — Emergency Department (HOSPITAL_COMMUNITY): Payer: Medicare Other

## 2015-01-08 ENCOUNTER — Encounter (HOSPITAL_COMMUNITY): Payer: Self-pay | Admitting: Cardiology

## 2015-01-08 ENCOUNTER — Inpatient Hospital Stay (HOSPITAL_COMMUNITY)
Admission: EM | Admit: 2015-01-08 | Discharge: 2015-01-12 | DRG: 287 | Disposition: A | Discharge status: Home / Self Care | Payer: Medicare Other | Attending: Interventional Cardiology | Admitting: Interventional Cardiology

## 2015-01-08 DIAGNOSIS — R079 Chest pain, unspecified: Secondary | ICD-10-CM | POA: Diagnosis not present

## 2015-01-08 DIAGNOSIS — I200 Unstable angina: Secondary | ICD-10-CM | POA: Diagnosis present

## 2015-01-08 DIAGNOSIS — F17210 Nicotine dependence, cigarettes, uncomplicated: Secondary | ICD-10-CM | POA: Diagnosis present

## 2015-01-08 DIAGNOSIS — Z7982 Long term (current) use of aspirin: Secondary | ICD-10-CM | POA: Diagnosis not present

## 2015-01-08 DIAGNOSIS — Z8249 Family history of ischemic heart disease and other diseases of the circulatory system: Secondary | ICD-10-CM

## 2015-01-08 DIAGNOSIS — I25110 Atherosclerotic heart disease of native coronary artery with unstable angina pectoris: Principal | ICD-10-CM | POA: Diagnosis present

## 2015-01-08 DIAGNOSIS — E871 Hypo-osmolality and hyponatremia: Secondary | ICD-10-CM | POA: Diagnosis not present

## 2015-01-08 DIAGNOSIS — I2510 Atherosclerotic heart disease of native coronary artery without angina pectoris: Secondary | ICD-10-CM | POA: Diagnosis present

## 2015-01-08 DIAGNOSIS — J40 Bronchitis, not specified as acute or chronic: Secondary | ICD-10-CM | POA: Diagnosis not present

## 2015-01-08 DIAGNOSIS — Z79899 Other long term (current) drug therapy: Secondary | ICD-10-CM

## 2015-01-08 DIAGNOSIS — E669 Obesity, unspecified: Secondary | ICD-10-CM | POA: Diagnosis present

## 2015-01-08 DIAGNOSIS — E785 Hyperlipidemia, unspecified: Secondary | ICD-10-CM | POA: Diagnosis present

## 2015-01-08 DIAGNOSIS — Z6829 Body mass index (BMI) 29.0-29.9, adult: Secondary | ICD-10-CM | POA: Diagnosis not present

## 2015-01-08 DIAGNOSIS — Z955 Presence of coronary angioplasty implant and graft: Secondary | ICD-10-CM

## 2015-01-08 DIAGNOSIS — G629 Polyneuropathy, unspecified: Secondary | ICD-10-CM | POA: Diagnosis present

## 2015-01-08 DIAGNOSIS — K219 Gastro-esophageal reflux disease without esophagitis: Secondary | ICD-10-CM | POA: Diagnosis present

## 2015-01-08 DIAGNOSIS — Z86718 Personal history of other venous thrombosis and embolism: Secondary | ICD-10-CM | POA: Diagnosis not present

## 2015-01-08 DIAGNOSIS — Z96652 Presence of left artificial knee joint: Secondary | ICD-10-CM | POA: Diagnosis present

## 2015-01-08 DIAGNOSIS — M549 Dorsalgia, unspecified: Secondary | ICD-10-CM | POA: Diagnosis present

## 2015-01-08 DIAGNOSIS — E781 Pure hyperglyceridemia: Secondary | ICD-10-CM | POA: Diagnosis present

## 2015-01-08 DIAGNOSIS — N400 Enlarged prostate without lower urinary tract symptoms: Secondary | ICD-10-CM | POA: Diagnosis present

## 2015-01-08 DIAGNOSIS — R0789 Other chest pain: Secondary | ICD-10-CM | POA: Diagnosis not present

## 2015-01-08 DIAGNOSIS — I252 Old myocardial infarction: Secondary | ICD-10-CM

## 2015-01-08 DIAGNOSIS — I10 Essential (primary) hypertension: Secondary | ICD-10-CM | POA: Diagnosis present

## 2015-01-08 DIAGNOSIS — Z981 Arthrodesis status: Secondary | ICD-10-CM

## 2015-01-08 DIAGNOSIS — G8929 Other chronic pain: Secondary | ICD-10-CM | POA: Diagnosis present

## 2015-01-08 DIAGNOSIS — Z7902 Long term (current) use of antithrombotics/antiplatelets: Secondary | ICD-10-CM

## 2015-01-08 LAB — D-DIMER, QUANTITATIVE: D-Dimer, Quant: 0.47 ug/mL-FEU (ref 0.00–0.48)

## 2015-01-08 LAB — BRAIN NATRIURETIC PEPTIDE: B NATRIURETIC PEPTIDE 5: 58.2 pg/mL (ref 0.0–100.0)

## 2015-01-08 LAB — CBC
HCT: 44.2 % (ref 39.0–52.0)
HEMOGLOBIN: 15.7 g/dL (ref 13.0–17.0)
MCH: 30.3 pg (ref 26.0–34.0)
MCHC: 35.5 g/dL (ref 30.0–36.0)
MCV: 85.3 fL (ref 78.0–100.0)
PLATELETS: 241 10*3/uL (ref 150–400)
RBC: 5.18 MIL/uL (ref 4.22–5.81)
RDW: 12.9 % (ref 11.5–15.5)
WBC: 7.4 10*3/uL (ref 4.0–10.5)

## 2015-01-08 LAB — BASIC METABOLIC PANEL
ANION GAP: 9 (ref 5–15)
BUN: 7 mg/dL (ref 6–23)
CALCIUM: 9.6 mg/dL (ref 8.4–10.5)
CO2: 23 mmol/L (ref 19–32)
CREATININE: 1.16 mg/dL (ref 0.50–1.35)
Chloride: 95 mmol/L — ABNORMAL LOW (ref 96–112)
GFR, EST AFRICAN AMERICAN: 73 mL/min — AB (ref >90)
GFR, EST NON AFRICAN AMERICAN: 63 mL/min — AB (ref >90)
Glucose, Bld: 150 mg/dL — ABNORMAL HIGH (ref 70–99)
POTASSIUM: 4.3 mmol/L (ref 3.5–5.1)
SODIUM: 127 mmol/L — AB (ref 135–145)

## 2015-01-08 LAB — I-STAT TROPONIN, ED
TROPONIN I, POC: 0 ng/mL (ref 0.00–0.08)
Troponin i, poc: 0.01 ng/mL (ref 0.00–0.08)

## 2015-01-08 MED ORDER — ONDANSETRON HCL 4 MG/2ML IJ SOLN
4.0000 mg | Freq: Once | INTRAMUSCULAR | Status: AC
  Administered 2015-01-08: 4 mg via INTRAVENOUS
  Filled 2015-01-08: qty 2

## 2015-01-08 MED ORDER — MORPHINE SULFATE 2 MG/ML IJ SOLN
2.0000 mg | Freq: Once | INTRAMUSCULAR | Status: AC
  Administered 2015-01-08: 2 mg via INTRAVENOUS
  Filled 2015-01-08: qty 1

## 2015-01-08 MED ORDER — SODIUM CHLORIDE 0.9 % IV SOLN
INTRAVENOUS | Status: DC
  Administered 2015-01-08: 21:00:00 via INTRAVENOUS

## 2015-01-08 NOTE — Consult Note (Signed)
CARDIOLOGY ADMISSION/CONSULT NOTE  Assessment and Plan:  *Chest pain:  Andrew Shaw is a pleasant 69 year old male with history of CAD status post PCI to left circumflex in 2014, hypertension, dyslipidemia comes to the emergency department with symptoms of unstable angina. His min endorsing symptoms of substernal chest pain radiated to the left arm that improves with nitroglycerin over past 2-3 days. He states that the symptoms are somewhat similar to his previous symptoms in 2014 requiring PCI to left circumflex. Differential although includes bronchitis/pneumonitis patient did have upper respiratory infection (flulike illness ) about a week ago. Fortunately, his initial EKG and cardiac biomarkers are reassuring.   -- Continue aspirin 81 mg daily -- Continue Plavix 75 mg daily -- Continue metoprolol tartrate 50mg  by mouth twice a day -- Continue rosuvastatin 20 mg daily -- Continue Avapro 150 mg by mouth daily -- We'll obtain a transthoracic echocardiogram and plan for likely noninvasive perfusion testing on Monday. -- Obtain fasting lipid panel in the morning   *Dyslipidemia: -- Continue rosuvastatin and TriCor   *FEN GI -- Replete electrolytes as necessary -- Heart healthy diet   *Prophylaxis: -- No indication for for GI prophylaxis at this time.   *CODE STATUS: Full Code  Primary cardiologist: Dr. Quay Burow  Chief complaint: Chest pain   HPI:  Andrew Shaw is a pleasant 69 year old male with history of CAD PCI to left circumflex on 12/23/2012, hypertension, dyslipidemia comes to the emergency department with chest pains. He states that he was doing fairly well up until Thursday when he started having symptoms of substernal chest pain with some radiation to his left arm. He stated as long as he stayed in the recliner he did not have recurrence of the pain; however, with any movement he would notice substernal squeezing/pressure-like sensation. Initially, he would take by  mouth aspirin with no real improvement then however after taking sublingual nitroglycerin he noticed fairly sudden onset improvement in his pain. He had contacted Dr. Kennon Holter office during the week and had scheduled a stress test on Friday. However, his wife asked him to come to the emergency department today due to persistent symptoms. He denies any changes in the frequency or characteristics of the pain over past 2-3 days. He of note is any symptoms suggestive of progressive heart failure such as orthopnea, PND or lotion edema. Of note, patient endorses upper respiratory infection about a week ago that he describes as flu. Currently, he denies any cough, fevers, productive sputum.  Cardiac history:  CAD s/p PCI to left circumflex 12/23/2012, 50-60% mid RCA Previous cardiac imaging  EKG 01/08/2015 at 4:13 PM: Normal sinus rhythm, incomplete right bundle branch block  TTE:  10/31/2010: Normal LVEF  Prior cath:  12/2012: 50% RCA, 90% left circumflex requiring bare metal stent.  Past Medical History Past Medical History  Diagnosis Date  . Coronary artery disease     2D ECHO, 10/31/2010 - EF >55%, normal; NUCLEAR STRESS TEST, 10/23/2010 - perfusion defect in inferior myocardial region, post-stress EF 62%, EKG negative for ischemia  . Hypertension   . Hyperlipemia   . Shortness of breath   . Chronic back pain   . DVT (deep venous thrombosis)     history 2004 after knee surg  . BPH (benign prostatic hyperplasia)   . Arthritis   . Neuromuscular disorder   . Carpal tunnel syndrome   . 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 GERD (gastroesophageal reflux disease)   . Myocardial infarction  2005    from steroids  . S/P angioplasty with stent, BMS to LCX  12/23/12 12/23/2012  . Back pain 12/23/2012    Allergies: Allergies  Allergen Reactions  . Lisinopril Rash and Other (See Comments)    Swelling of face  . Antihistamines, Chlorpheniramine-Type Itching  . Cortisone     MI  . Flexeril  [Cyclobenzaprine] Itching   Medications:  Aspirin 81 mg daily Plavix 75 g daily Avapro 150 mg daily Metoprolol tartrate 50 mg twice a day OxyContin 20 mg twice a day Rosuvastatin 20 mg daily  Social History History   Social History  . Marital Status: Married    Spouse Name: N/A  . Number of Children: N/A  . Years of Education: N/A   Occupational History  . Not on file.   Social History Main Topics  . Smoking status: Current Some Day Smoker -- 0.25 packs/day for 50 years    Types: Cigarettes  . Smokeless tobacco: Never Used  . Alcohol Use: No  . Drug Use: No  . Sexual Activity: Not on file   Other Topics Concern  . Not on file   Social History Narrative    Family History Family History  Problem Relation Age of Onset  . Heart disease Mother   . Hypertension Sister   . Diabetes Sister   . Cancer Brother     Physical Exam Filed Vitals:   01/08/15 2330  BP: 151/80  Pulse: 72  Temp:   Resp:     Gen: Comfortable appearing in no acute distress, obese HEENT: Moist mucous membranes, normal oropharynx. No cervical lymphadenopathy Neck: Supple, difficult to assess for JVD due to body habitus, no carotid bruits CV: Regular rate, normal S1, slightly loud S2, no obvious murmurs +2 pulses in bilateral upper extremities Pulm: Clear to auscultation bilaterally and posterior lung fields, normal worker breathing Abdomen: Obese, no pain with palpation in the left upper quadrant, difficult to palpate for organomegaly Ext: No edema  Labs:  Results for orders placed or performed during the hospital encounter of 01/08/15 (from the past 24 hour(s))  CBC     Status: None   Collection Time: 01/08/15  4:17 PM  Result Value Ref Range   WBC 7.4 4.0 - 10.5 K/uL   RBC 5.18 4.22 - 5.81 MIL/uL   Hemoglobin 15.7 13.0 - 17.0 g/dL   HCT 44.2 39.0 - 52.0 %   MCV 85.3 78.0 - 100.0 fL   MCH 30.3 26.0 - 34.0 pg   MCHC 35.5 30.0 - 36.0 g/dL   RDW 12.9 11.5 - 15.5 %   Platelets 241 150  - 400 K/uL  Basic metabolic panel     Status: Abnormal   Collection Time: 01/08/15  4:17 PM  Result Value Ref Range   Sodium 127 (L) 135 - 145 mmol/L   Potassium 4.3 3.5 - 5.1 mmol/L   Chloride 95 (L) 96 - 112 mmol/L   CO2 23 19 - 32 mmol/L   Glucose, Bld 150 (H) 70 - 99 mg/dL   BUN 7 6 - 23 mg/dL   Creatinine, Ser 1.16 0.50 - 1.35 mg/dL   Calcium 9.6 8.4 - 10.5 mg/dL   GFR calc non Af Amer 63 (L) >90 mL/min   GFR calc Af Amer 73 (L) >90 mL/min   Anion gap 9 5 - 15  BNP (order ONLY if patient complains of dyspnea/SOB AND you have documented it for THIS visit)     Status: None   Collection  Time: 01/08/15  4:17 PM  Result Value Ref Range   B Natriuretic Peptide 58.2 0.0 - 100.0 pg/mL  I-stat troponin, ED (not at [facility])     Status: None   Collection Time: 01/08/15  4:46 PM  Result Value Ref Range   Troponin i, poc 0.01 0.00 - 0.08 ng/mL   Comment 3          D-dimer, quantitative     Status: None   Collection Time: 01/08/15  7:17 PM  Result Value Ref Range   D-Dimer, Quant 0.47 0.00 - 0.48 ug/mL-FEU  I-Stat Troponin, ED (not at [facility])     Status: None   Collection Time: 01/08/15  9:31 PM  Result Value Ref Range   Troponin i, poc 0.00 0.00 - 0.08 ng/mL   Comment 3

## 2015-01-08 NOTE — ED Provider Notes (Addendum)
CSN: [id number]     Arrival date & time 01/08/15  1609 History   First MD Initiated Contact with Patient 01/08/15 1840     Chief Complaint  Patient presents with  . Chest Pain     (Consider location/radiation/quality/duration/timing/severity/associated sxs/prior Treatment) The history is provided by the patient and the spouse.   patient with history of known coronary artery disease area patient status post stent in 2014. Patient's cardiologist is Dr. Gwenlyn Found. Patient with the intermittent chest pain that started on Thursday. Sort of a dull ache which periods of sharp pain 3 episodes of sharp pain on Thursday 2 episodes on Friday. Sharp pain would be relieved with nitroglycerin. Patient has had aspirin today. Today the pain is intermittent and more frequent. Associated with shortness of breath nausea no vomiting and some dizziness. Also with some left calf discomfort. Patient is concerned that maybe he has a another blood clot. End or pulmonary embolus. Patient states the pain is currently 4 out of 10.  Past Medical History  Diagnosis Date  . Coronary artery disease     2D ECHO, 10/31/2010 - EF >55%, normal; NUCLEAR STRESS TEST, 10/23/2010 - perfusion defect in inferior myocardial region, post-stress EF 62%, EKG negative for ischemia  . Hypertension   . Hyperlipemia   . Shortness of breath   . Chronic back pain   . DVT (deep venous thrombosis)     history 2004 after knee surg  . BPH (benign prostatic hyperplasia)   . Arthritis   . Neuromuscular disorder   . Carpal tunnel syndrome   . 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 GERD (gastroesophageal reflux disease)   . 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 S/P angioplasty with stent, BMS to LCX  12/23/12 12/23/2012  . Back pain 12/23/2012   Past Surgical History  Procedure Laterality Date  . Knee arthroscopy  04,06    left  . Joint replacement  2008    lt total knee  . Manipulation knee joint  2009    closed lt knee   .  Hernia repair  2008    umb   . Cervical fusion  1999  . Back surgery  2004    lumb fusion  . Epidural block injection      multiple lumbar  . Carpal tunnel release  04/15/2012    Procedure: CARPAL TUNNEL RELEASE;  Surgeon: Wynonia Sours, MD;  Location: [place];  Service: Orthopedics;  Laterality: Left;  . Trigger finger release  04/15/2012    Procedure: RELEASE TRIGGER FINGER/A-1 PULLEY;  Surgeon: Wynonia Sours, MD;  Location: [place];  Service: Orthopedics;  Laterality: Left;  left thumb and little finger  . Cardiac catheterization  12/23/2012    Mid nondominant AV groove circumflex stented with a 2.5x37mm Mini Vision stent resulting in a reduction of 90% stenosis to 0% residual  . Cardiac catheterization  02/07/2004    Noncritical CAD, continue medical therapy  . Cardiac catheterization  02/03/1999    Recommended medical therapy  . Left heart catheterization with coronary angiogram N/A 12/23/2012    Procedure: LEFT HEART CATHETERIZATION WITH CORONARY ANGIOGRAM;  Surgeon: Lorretta Harp, MD;  Location: [facility] CATH LAB;  Service: Cardiovascular;  Laterality: N/A;  . Percutaneous coronary stent intervention (pci-s)  12/23/2012    Procedure: PERCUTANEOUS CORONARY STENT INTERVENTION (PCI-S);  Surgeon: Lorretta Harp, MD;  Location: [facility] CATH LAB;  Service: Cardiovascular;;   Family  History  Problem Relation Age of Onset  . Heart disease Mother   . Hypertension Sister   . Diabetes Sister   . Cancer Brother    History  Substance Use Topics  . Smoking status: Current Some Day Smoker -- 0.25 packs/day for 50 years    Types: Cigarettes  . Smokeless tobacco: Never Used  . Alcohol Use: No    Review of Systems  Constitutional: Negative for fever.  HENT: Negative for congestion.   Eyes: Negative for redness.  Respiratory: Positive for shortness of breath.   Cardiovascular: Positive for chest pain. Negative for palpitations.  Gastrointestinal: Negative for  abdominal pain.  Genitourinary: Negative for dysuria.  Musculoskeletal: Positive for back pain.  Skin: Negative for rash.  Neurological: Negative for headaches.  Hematological: Does not bruise/bleed easily.  Psychiatric/Behavioral: Negative for confusion.      Allergies  Lisinopril; Antihistamines, chlorpheniramine-type; Cortisone; and Flexeril  Home Medications   Prior to Admission medications   Medication Sig Start Date End Date Taking? Authorizing Provider  aspirin EC 81 MG tablet Take 81 mg by mouth daily.    Historical Provider, MD  clopidogrel (PLAVIX) 75 MG tablet TAKE 1 TABLET BY MOUTH EVERY DAY 12/07/14   Lorretta Harp, MD  esomeprazole (NEXIUM) 40 MG capsule Take 40 mg by mouth daily as needed. For acid indigestion from spicy food    Historical Provider, MD  fenofibrate (TRICOR) 48 MG tablet Take 48 mg by mouth daily.    Historical Provider, MD  HYDROcodone-acetaminophen (NORCO) 10-325 MG per tablet Take 1 tablet by mouth every 6 (six) hours as needed for pain.    Historical Provider, MD  irbesartan (AVAPRO) 150 MG tablet TAKE 1 TABLET BY MOUTH DAILY 05/17/14   Lorretta Harp, MD  metoprolol (LOPRESSOR) 50 MG tablet TAKE 1 TABLET BY MOUTH TWICE DAILY 09/27/14   Lorretta Harp, MD  nitroGLYCERIN (NITROSTAT) 0.4 MG SL tablet Place 1 tablet (0.4 mg total) under the tongue every 5 (five) minutes x 3 doses as needed for chest pain. 12/24/12   Erlene Quan, PA-C  oxyCODONE (OXYCONTIN) 20 MG 12 hr tablet Take 20 mg by mouth 3 (three) times daily.    Historical Provider, MD  rosuvastatin (CRESTOR) 20 MG tablet Take 20 mg by mouth daily.    Historical Provider, MD   BP 141/84 mmHg  Pulse 76  Temp(Src) 97.7 F (36.5 C) (Oral)  Resp 17  Ht 5\' 6"  (1.676 m)  Wt 200 lb (90.719 kg)  BMI 32.30 kg/m2  SpO2 96% Physical Exam  Constitutional: He appears well-developed and well-nourished.  HENT:  Head: Normocephalic and atraumatic.  Eyes: Conjunctivae and EOM are normal.  Neck:  Normal range of motion.  Pulmonary/Chest: Effort normal and breath sounds normal.  Abdominal: Soft. Bowel sounds are normal.  Neurological: He is alert. No cranial nerve deficit. He exhibits normal muscle tone. Coordination normal.  Skin: Skin is warm.  Nursing note and vitals reviewed.   ED Course  Procedures (including critical care time) Labs Review Labs Reviewed  BASIC METABOLIC PANEL - Abnormal; Notable for the following:    Sodium 127 (*)    Chloride 95 (*)    Glucose, Bld 150 (*)    GFR calc non Af Amer 63 (*)    GFR calc Af Amer 73 (*)    All other components within normal limits  CBC  BRAIN NATRIURETIC PEPTIDE  I-STAT TROPOININ, ED   Results for orders placed or performed during the hospital  encounter of 01/08/15  CBC  Result Value Ref Range   WBC 7.4 4.0 - 10.5 K/uL   RBC 5.18 4.22 - 5.81 MIL/uL   Hemoglobin 15.7 13.0 - 17.0 g/dL   HCT 44.2 39.0 - 52.0 %   MCV 85.3 78.0 - 100.0 fL   MCH 30.3 26.0 - 34.0 pg   MCHC 35.5 30.0 - 36.0 g/dL   RDW 12.9 11.5 - 15.5 %   Platelets 241 150 - 400 K/uL  Basic metabolic panel  Result Value Ref Range   Sodium 127 (L) 135 - 145 mmol/L   Potassium 4.3 3.5 - 5.1 mmol/L   Chloride 95 (L) 96 - 112 mmol/L   CO2 23 19 - 32 mmol/L   Glucose, Bld 150 (H) 70 - 99 mg/dL   BUN 7 6 - 23 mg/dL   Creatinine, Ser 1.16 0.50 - 1.35 mg/dL   Calcium 9.6 8.4 - 10.5 mg/dL   GFR calc non Af Amer 63 (L) >90 mL/min   GFR calc Af Amer 73 (L) >90 mL/min   Anion gap 9 5 - 15  BNP (order ONLY if patient complains of dyspnea/SOB AND you have documented it for THIS visit)  Result Value Ref Range   B Natriuretic Peptide 58.2 0.0 - 100.0 pg/mL  I-stat troponin, ED (not at [facility])  Result Value Ref Range   Troponin i, poc 0.01 0.00 - 0.08 ng/mL   Comment 3             Imaging Review Dg Chest 2 View  01/08/2015   CLINICAL DATA:  Mid sternal chest pain radiating to back. Shortness of breath, dizziness, nausea.  EXAM: CHEST  2 VIEW  COMPARISON:   12/22/2012  FINDINGS: Mild peribronchial thickening. Heart and mediastinal contours are within normal limits. No focal opacities or effusions. No acute bony abnormality.  IMPRESSION: Mild bronchitic changes.   Electronically Signed   By: Rolm Baptise M.D.   On: 01/08/2015 17:58     EKG Interpretation   Date/Time:  Saturday January 08 2015 16:13:47 EST Ventricular Rate:  91 PR Interval:  202 QRS Duration: 94 QT Interval:  362 QTC Calculation: 445 R Axis:   29 Text Interpretation:  Normal sinus rhythm Incomplete right bundle branch  block Septal infarct , age undetermined Abnormal ECG Artifact Confirmed by  Dymon Summerhill  MD, Buryl Bamber [phone]) on 01/08/2015 7:06:04 PM      MDM   Final diagnoses:  Chest pain, unspecified chest pain type    Patient with an unstable angina type pattern of chest pain since Thursday. Worse today. Patient is now currently pain free. Patient would have sharp pain that would go away with nitroglycerin but had some persistent chest pressure anteriorly. Patient had a stent placed in 2014. Patient has risk factors for coronary artery disease and documented coronary disease. Discussed with the on-call cardiologist they agree the patient needs admission they will come down and see the patient currently tied up in the Cath Lab. Also ruled out concerns for pulmonary embolus. Patient has a history of DVTs in the past d-dimer was negative. Troponins 2 are negative. EKG with incomplete right bundle branch block of age undetermined septal infarct no acute changes.  As stated patient currently is pain-free. Have not started the patient on heparin. Cardiology aware.  Fredia Sorrow, MD 01/09/15 3154  Fredia Sorrow, MD 01/20/15 [phone]

## 2015-01-08 NOTE — ED Notes (Addendum)
Pt reports chest pain and SOB since Thursday. Reports Dr. Gwenlyn Found is his heart MD. States he is scheduled for a stress test next Friday but the pain has become worse

## 2015-01-09 ENCOUNTER — Telehealth: Payer: Self-pay | Admitting: Physician Assistant

## 2015-01-09 ENCOUNTER — Other Ambulatory Visit (HOSPITAL_COMMUNITY): Payer: Self-pay

## 2015-01-09 DIAGNOSIS — I10 Essential (primary) hypertension: Secondary | ICD-10-CM

## 2015-01-09 DIAGNOSIS — R079 Chest pain, unspecified: Secondary | ICD-10-CM

## 2015-01-09 LAB — LIPID PANEL
Cholesterol: 146 mg/dL (ref 0–200)
HDL: 44 mg/dL (ref >39)
LDL Cholesterol: 75 mg/dL (ref 0–99)
Total CHOL/HDL Ratio: 3.3 RATIO
Triglycerides: 135 mg/dL (ref <150)
VLDL: 27 mg/dL (ref 0–40)

## 2015-01-09 LAB — BASIC METABOLIC PANEL
ANION GAP: 6 (ref 5–15)
BUN: <5 mg/dL — ABNORMAL LOW (ref 6–23)
CO2: 24 mmol/L (ref 19–32)
Calcium: 8.8 mg/dL (ref 8.4–10.5)
Chloride: 95 mmol/L — ABNORMAL LOW (ref 96–112)
Creatinine, Ser: 1 mg/dL (ref 0.50–1.35)
GFR calc Af Amer: 87 mL/min — ABNORMAL LOW (ref >90)
GFR, EST NON AFRICAN AMERICAN: 75 mL/min — AB (ref >90)
Glucose, Bld: 110 mg/dL — ABNORMAL HIGH (ref 70–99)
Potassium: 4.2 mmol/L (ref 3.5–5.1)
Sodium: 125 mmol/L — ABNORMAL LOW (ref 135–145)

## 2015-01-09 LAB — CBC
HCT: 45.9 % (ref 39.0–52.0)
Hemoglobin: 16.5 g/dL (ref 13.0–17.0)
MCH: 31.1 pg (ref 26.0–34.0)
MCHC: 35.9 g/dL (ref 30.0–36.0)
MCV: 86.4 fL (ref 78.0–100.0)
Platelets: 218 10*3/uL (ref 150–400)
RBC: 5.31 MIL/uL (ref 4.22–5.81)
RDW: 13.1 % (ref 11.5–15.5)
WBC: 6.8 10*3/uL (ref 4.0–10.5)

## 2015-01-09 LAB — MRSA PCR SCREENING: MRSA by PCR: NEGATIVE

## 2015-01-09 MED ORDER — ASPIRIN 81 MG PO CHEW
81.0000 mg | CHEWABLE_TABLET | ORAL | Status: AC
  Administered 2015-01-10: 81 mg via ORAL
  Filled 2015-01-09: qty 1

## 2015-01-09 MED ORDER — IRBESARTAN 150 MG PO TABS
150.0000 mg | ORAL_TABLET | Freq: Every day | ORAL | Status: DC
  Administered 2015-01-09: 150 mg via ORAL
  Filled 2015-01-09 (×2): qty 1

## 2015-01-09 MED ORDER — ROSUVASTATIN CALCIUM 20 MG PO TABS
20.0000 mg | ORAL_TABLET | Freq: Every day | ORAL | Status: DC
  Administered 2015-01-09 – 2015-01-12 (×4): 20 mg via ORAL
  Filled 2015-01-09 (×4): qty 1

## 2015-01-09 MED ORDER — OXYCODONE HCL ER 20 MG PO T12A
20.0000 mg | EXTENDED_RELEASE_TABLET | Freq: Three times a day (TID) | ORAL | Status: DC
  Administered 2015-01-09 – 2015-01-12 (×9): 20 mg via ORAL
  Filled 2015-01-09 (×9): qty 2

## 2015-01-09 MED ORDER — ASPIRIN EC 81 MG PO TBEC
81.0000 mg | DELAYED_RELEASE_TABLET | Freq: Every day | ORAL | Status: DC
  Administered 2015-01-09: 81 mg via ORAL
  Filled 2015-01-09: qty 1

## 2015-01-09 MED ORDER — ACETAMINOPHEN 325 MG PO TABS: 650.0000 mg | ORAL_TABLET | ORAL | Status: DC | PRN

## 2015-01-09 MED ORDER — CLOPIDOGREL BISULFATE 75 MG PO TABS
75.0000 mg | ORAL_TABLET | Freq: Every day | ORAL | Status: DC
  Administered 2015-01-09 – 2015-01-12 (×4): 75 mg via ORAL
  Filled 2015-01-09 (×4): qty 1

## 2015-01-09 MED ORDER — HYDROCODONE-ACETAMINOPHEN 10-325 MG PO TABS
1.0000 | ORAL_TABLET | ORAL | Status: DC | PRN
  Administered 2015-01-09 – 2015-01-12 (×16): 1 via ORAL
  Filled 2015-01-09 (×15): qty 1

## 2015-01-09 MED ORDER — NITROGLYCERIN 0.4 MG SL SUBL: 0.4000 mg | SUBLINGUAL_TABLET | SUBLINGUAL | Status: DC | PRN

## 2015-01-09 MED ORDER — OXYCODONE HCL ER 10 MG PO T12A
20.0000 mg | EXTENDED_RELEASE_TABLET | Freq: Once | ORAL | Status: AC
  Administered 2015-01-09: 20 mg via ORAL
  Filled 2015-01-09: qty 2

## 2015-01-09 MED ORDER — AMLODIPINE BESYLATE 2.5 MG PO TABS
2.5000 mg | ORAL_TABLET | Freq: Every day | ORAL | Status: DC
Start: 2015-01-09 — End: 2015-01-12
  Administered 2015-01-09 – 2015-01-12 (×4): 2.5 mg via ORAL
  Filled 2015-01-09 (×4): qty 1

## 2015-01-09 MED ORDER — ASPIRIN EC 81 MG PO TBEC
81.0000 mg | DELAYED_RELEASE_TABLET | Freq: Every day | ORAL | Status: DC
  Administered 2015-01-11 – 2015-01-12 (×2): 81 mg via ORAL
  Filled 2015-01-09 (×2): qty 1

## 2015-01-09 MED ORDER — METOPROLOL TARTRATE 50 MG PO TABS
50.0000 mg | ORAL_TABLET | Freq: Two times a day (BID) | ORAL | Status: DC
  Administered 2015-01-09 – 2015-01-12 (×6): 50 mg via ORAL
  Filled 2015-01-09: qty 2
  Filled 2015-01-09 (×2): qty 1
  Filled 2015-01-09 (×2): qty 2
  Filled 2015-01-09 (×5): qty 1

## 2015-01-09 MED ORDER — FENOFIBRATE 54 MG PO TABS
54.0000 mg | ORAL_TABLET | Freq: Every day | ORAL | Status: DC
Start: 2015-01-09 — End: 2015-01-12
  Administered 2015-01-09 – 2015-01-12 (×4): 54 mg via ORAL
  Filled 2015-01-09 (×4): qty 1

## 2015-01-09 MED ORDER — ONDANSETRON HCL 4 MG/2ML IJ SOLN: 4.0000 mg | Freq: Four times a day (QID) | INTRAMUSCULAR | Status: DC | PRN

## 2015-01-09 NOTE — ED Notes (Signed)
Pt has slept off and on throughout the night.  Reported earlier only pressure now left upper back area.

## 2015-01-09 NOTE — Progress Notes (Signed)
Subjective: Deneis CP  No SOB   Objective: Filed Vitals:   01/09/15 1200 01/09/15 1300 01/09/15 1400 01/09/15 1445  BP: 139/71 133/77 143/72 145/75  Pulse: 47 62 64 71  Temp:      TempSrc:      Resp:      Height:    5\' 7"  (1.702 m)  Weight:    190 lb 11.2 oz (86.5 kg)  SpO2: 97% 97% 98% 96%   Weight change:   Intake/Output Summary (Last 24 hours) at 01/09/15 1521 Last data filed at 01/09/15 1400  Gross per 24 hour  Intake      0 ml  Output   1250 ml  Net  -1250 ml    General: Alert, awake, oriented x3, in no acute distress Neck:  JVP is normal Heart: Regular rate and rhythm, without murmurs, rubs, gallops.  Lungs: Clear to auscultation.  No rales or wheezes. Exemities:  No edema.   Neuro: Grossly intact, nonfocal.  Tele:  SR   Lab Results: Results for orders placed or performed during the hospital encounter of 01/08/15 (from the past 24 hour(s))  CBC     Status: None   Collection Time: 01/08/15  4:17 PM  Result Value Ref Range   WBC 7.4 4.0 - 10.5 K/uL   RBC 5.18 4.22 - 5.81 MIL/uL   Hemoglobin 15.7 13.0 - 17.0 g/dL   HCT 44.2 39.0 - 52.0 %   MCV 85.3 78.0 - 100.0 fL   MCH 30.3 26.0 - 34.0 pg   MCHC 35.5 30.0 - 36.0 g/dL   RDW 12.9 11.5 - 15.5 %   Platelets 241 150 - 400 K/uL  Basic metabolic panel     Status: Abnormal   Collection Time: 01/08/15  4:17 PM  Result Value Ref Range   Sodium 127 (L) 135 - 145 mmol/L   Potassium 4.3 3.5 - 5.1 mmol/L   Chloride 95 (L) 96 - 112 mmol/L   CO2 23 19 - 32 mmol/L   Glucose, Bld 150 (H) 70 - 99 mg/dL   BUN 7 6 - 23 mg/dL   Creatinine, Ser 1.16 0.50 - 1.35 mg/dL   Calcium 9.6 8.4 - 10.5 mg/dL   GFR calc non Af Amer 63 (L) >90 mL/min   GFR calc Af Amer 73 (L) >90 mL/min   Anion gap 9 5 - 15  BNP (order ONLY if patient complains of dyspnea/SOB AND you have documented it for THIS visit)     Status: None   Collection Time: 01/08/15  4:17 PM  Result Value Ref Range   B Natriuretic Peptide 58.2 0.0 - 100.0 pg/mL    I-stat troponin, ED (not at [facility])     Status: None   Collection Time: 01/08/15  4:46 PM  Result Value Ref Range   Troponin i, poc 0.01 0.00 - 0.08 ng/mL   Comment 3          D-dimer, quantitative     Status: None   Collection Time: 01/08/15  7:17 PM  Result Value Ref Range   D-Dimer, Quant 0.47 0.00 - 0.48 ug/mL-FEU  I-Stat Troponin, ED (not at [facility])     Status: None   Collection Time: 01/08/15  9:31 PM  Result Value Ref Range   Troponin i, poc 0.00 0.00 - 0.08 ng/mL   Comment 3          Basic metabolic panel     Status: Abnormal   Collection Time: 01/09/15  7:35  AM  Result Value Ref Range   Sodium 125 (L) 135 - 145 mmol/L   Potassium 4.2 3.5 - 5.1 mmol/L   Chloride 95 (L) 96 - 112 mmol/L   CO2 24 19 - 32 mmol/L   Glucose, Bld 110 (H) 70 - 99 mg/dL   BUN <5 (L) 6 - 23 mg/dL   Creatinine, Ser 1.00 0.50 - 1.35 mg/dL   Calcium 8.8 8.4 - 10.5 mg/dL   GFR calc non Af Amer 75 (L) >90 mL/min   GFR calc Af Amer 87 (L) >90 mL/min   Anion gap 6 5 - 15  Lipid panel     Status: None   Collection Time: 01/09/15  8:21 AM  Result Value Ref Range   Cholesterol 146 0 - 200 mg/dL   Triglycerides 135 <150 mg/dL   HDL 44 >39 mg/dL   Total CHOL/HDL Ratio 3.3 RATIO   VLDL 27 0 - 40 mg/dL   LDL Cholesterol 75 0 - 99 mg/dL  CBC     Status: None   Collection Time: 01/09/15  8:21 AM  Result Value Ref Range   WBC 6.8 4.0 - 10.5 K/uL   RBC 5.31 4.22 - 5.81 MIL/uL   Hemoglobin 16.5 13.0 - 17.0 g/dL   HCT 45.9 39.0 - 52.0 %   MCV 86.4 78.0 - 100.0 fL   MCH 31.1 26.0 - 34.0 pg   MCHC 35.9 30.0 - 36.0 g/dL   RDW 13.1 11.5 - 15.5 %   Platelets 218 150 - 400 K/uL    Studies/Results: Dg Chest 2 View  01/08/2015   CLINICAL DATA:  Mid sternal chest pain radiating to back. Shortness of breath, dizziness, nausea.  EXAM: CHEST  2 VIEW  COMPARISON:  12/22/2012  FINDINGS: Mild peribronchial thickening. Heart and mediastinal contours are within normal limits. No focal opacities or effusions. No acute bony  abnormality.  IMPRESSION: Mild bronchitic changes.   Electronically Signed   By: Rolm Baptise M.D.   On: 01/08/2015 17:58    Medications: Reviewed     @PROBHOSP @  1.  Chest pain   Hx of CAD  S/p PCI BMS ot L Cx in 2014  Had 50 to 60% RCA ta time  Normal LM and LAD    Talking to patinet he has had 1 month of increased fatigue, decreased endurance Friday night had some N/, dizziness Yesterday CP was similar that discomfort prior to intervention but more intense. Scared him Came to ER  Currently no discomfort   Has ruled out  Symptoms noted above are concerning  Would sched for L heart cath tomorrow. To redefine anatomy  2.  HTN  BP is high  WIll add low dose amlodipine.  Follow    3.  HL  Continue meds  Lipids OK      LOS: 1 day   Dorris Carnes 01/09/2015, 3:21 PM

## 2015-01-09 NOTE — ED Notes (Signed)
Pt continues to report he has chest pain "PRESSURE" that radiates from left upper chest through to his back.  Pt had room but was unable to go to 2W due to continued active chest pain.  Call placed to Cardiology.

## 2015-01-09 NOTE — ED Notes (Signed)
Placed order for heart healthy breakfast tray.

## 2015-01-10 DIAGNOSIS — I200 Unstable angina: Secondary | ICD-10-CM

## 2015-01-10 LAB — CK TOTAL AND CKMB (NOT AT ARMC)
CK, MB: 1.5 ng/mL (ref 0.3–4.0)
RELATIVE INDEX: INVALID (ref 0.0–2.5)
Total CK: 62 U/L (ref 7–232)

## 2015-01-10 LAB — PROTIME-INR
INR: 1.08 (ref 0.00–1.49)
Prothrombin Time: 14.1 seconds (ref 11.6–15.2)

## 2015-01-10 MED ORDER — SODIUM CHLORIDE 0.9 % IJ SOLN
3.0000 mL | Freq: Two times a day (BID) | INTRAMUSCULAR | Status: DC
  Administered 2015-01-10 – 2015-01-11 (×2): 3 mL via INTRAVENOUS

## 2015-01-10 MED ORDER — SODIUM CHLORIDE 0.9 % IJ SOLN: 3.0000 mL | INTRAMUSCULAR | Status: DC | PRN

## 2015-01-10 MED ORDER — SODIUM CHLORIDE 0.9 % IV SOLN
INTRAVENOUS | Status: DC
  Administered 2015-01-11 (×2): via INTRAVENOUS

## 2015-01-10 MED ORDER — SODIUM CHLORIDE 0.9 % IV SOLN
INTRAVENOUS | Status: DC
Start: 2015-01-11 — End: 2015-01-10
  Administered 2015-01-10: 04:00:00 via INTRAVENOUS

## 2015-01-10 MED ORDER — SODIUM CHLORIDE 0.9 % IV SOLN: 250.0000 mL | INTRAVENOUS | Status: DC | PRN

## 2015-01-10 NOTE — Research (Signed)
Bioflow Informed Consent   Subject Name: Andrew Shaw  Subject met inclusion and exclusion criteria.  The informed consent form, study requirements and expectations were reviewed with the subject and questions and concerns were addressed prior to the signing of the consent form.  The subject verbalized understanding of the trail requirements.  The subject agreed to participate in the Bioflow trial and signed the informed consent.  The informed consent was obtained prior to performance of any protocol-specific procedures for the subject.  A copy of the signed informed consent was given to the subject and a copy was placed in the subject's medical record.  [name] 01/10/2015, 8:20

## 2015-01-10 NOTE — Progress Notes (Signed)
Progress Note  Subjective:    No chest pain this AM, states his back is hurting, has chronic back pain. No SOB, palpitations, or dizziness.   Objective:   Temp:  [97.7 F (36.5 C)-98.2 F (36.8 C)] 98.2 F (36.8 C) (02/29 0400) Pulse Rate:  [47-80] 58 (02/29 0400) BP: (121-173)/(50-82) 138/71 mmHg (02/29 0400) SpO2:  [93 %-98 %] 93 % (02/29 0400) Weight:  [190 lb 11.2 oz (86.5 kg)] 190 lb 11.2 oz (86.5 kg) (02/28 1445)    Filed Weights   01/08/15 1616 01/09/15 0441 01/09/15 1445  Weight: 200 lb (90.719 kg) 194 lb 5 oz (88.14 kg) 190 lb 11.2 oz (86.5 kg)    Intake/Output Summary (Last 24 hours) at 01/10/15 5035 Last data filed at 01/10/15 0600  Gross per 24 hour  Intake 836.25 ml  Output   2250 ml  Net -1413.75 ml    Telemetry: NSR/sinus bradycardia  Physical Exam: General: Overweight white male, alert, cooperative, NAD. HEENT: PERRL, EOMI. Moist mucus membranes Neck: Full range of motion without pain, supple, no lymphadenopathy or carotid bruits Lungs: Clear to ascultation bilaterally, normal work of respiration, no wheezes, rales, rhonchi Heart: RRR, no murmurs, gallops, or rubs Abdomen: Soft, non-tender, non-distended, BS + Extremities: No cyanosis, clubbing, or edema. Strong radial pulses.  Neurologic: Alert & oriented X3, cranial nerves II-XII intact, strength grossly intact, sensation intact to light touch   Lab Results:  Basic Metabolic Panel:  Recent Labs Lab 01/08/15 1617 01/09/15 0735  NA 127* 125*  K 4.3 4.2  CL 95* 95*  CO2 23 24  GLUCOSE 150* 110*  BUN 7 <5*  CREATININE 1.16 1.00  CALCIUM 9.6 8.8    CBC:  Recent Labs Lab 01/08/15 1617 01/09/15 0821  WBC 7.4 6.8  HGB 15.7 16.5  HCT 44.2 45.9  MCV 85.3 86.4  PLT 241 218    Coagulation:  Recent Labs Lab 01/10/15 0226  INR 1.08    Radiology: Dg Chest 2 View  01/08/2015   CLINICAL DATA:  Mid sternal chest pain radiating to back. Shortness of breath, dizziness, nausea.   EXAM: CHEST  2 VIEW  COMPARISON:  12/22/2012  FINDINGS: Mild peribronchial thickening. Heart and mediastinal contours are within normal limits. No focal opacities or effusions. No acute bony abnormality.  IMPRESSION: Mild bronchitic changes.   Electronically Signed   By: Rolm Baptise M.D.   On: 01/08/2015 17:58     ECG: Sinus bradycardia w/ 1st degree AV block   Medications:   Scheduled Medications: . amLODipine  2.5 mg Oral Daily  . [START ON 01/11/2015] aspirin EC  81 mg Oral Daily  . clopidogrel  75 mg Oral Daily  . fenofibrate  54 mg Oral Daily  . irbesartan  150 mg Oral Daily  . metoprolol  50 mg Oral BID  . OxyCODONE  20 mg Oral 3 times per day  . rosuvastatin  20 mg Oral Daily  . sodium chloride  3 mL Intravenous Q12H     Infusions: . [START ON 01/11/2015] sodium chloride 75 mL/hr at 01/10/15 0427     PRN Medications:  sodium chloride, acetaminophen, HYDROcodone-acetaminophen, nitroGLYCERIN, ondansetron (ZOFRAN) IV, sodium chloride   Assessment and Plan:  69 y/o M w/ PMHx of HTN, HLD, and CAD s/p PCI w/ BMS to LCx in 2014, admitted on 01/08/15 for chest pain.    Chest pain: H/o CAD s/p BMS to LCx (high-grade mid AV groove) on 12/23/12 by Dr. Gwenlyn Found. Patient admits to substernal  chest pain that radiates to the left arm for the past 2-3 days. States the pain is similar to previous when he had his PCI to LCx, actually states this time was worse. Also endorses decreased endurance and fatigue for the past 1 month. Troponin on admission normal, EKG w/ no obvious ischemic changes (this AM shows sinus bradycardia w/ 1st degree AV block). Given cardiac history and previous PCI, scheduled for cardiac cath today. -LHC this afternoon -Continue ASA + Plavix -Lopressor 50 mg bid -Crestor 20 mg qd, fenofibrate 54 mg qd -NTG, Oxycontin, Zofran  HTN: Hypertensive yesterday. Improved this AM.  -BP control; Avapro 150 mg daily + Norvasc 2.5 mg daily (added yesterday)  Hypertriglyceridemia:  Previous lipid panel from 12/2012 showed triglycerides of 151, now 135. LDL 75, HDL 44.  -Continue statin, fibrate as above    Natasha Bence, MD PGY-2 Internal Medicine Pager: [phone]  Patient seen and examined. Agree with assessment and plan. No recurrent chest pain.  However, pain on presentation was very similar and more intense than prior to his PCI in 2014. He has had several weeks of symptoms, but more intense since last Thursday. Plan cath today with Dr. Gwenlyn Found. The risks and benefits of a cardiac catheterization including, but not limited to, death, stroke, MI, kidney damage and bleeding were discussed with the patient who indicates understanding and agrees to proceed.    Troy Sine, MD, [facility] 01/10/2015 9:38 AM

## 2015-01-10 NOTE — Progress Notes (Signed)
Patient's cath delayed until tomorrow. He has been chest pain free since yesterday. Will order diet and NPO past midnight. Informed patient and nursing staff  Signed, Almyra Deforest PA Pager: [phone]

## 2015-01-10 NOTE — Care Management Note (Addendum)
    Page 1 of 1   01/11/2015     9:42:06 AM CARE MANAGEMENT NOTE 01/11/2015  Patient:  Andrew Shaw, Andrew Shaw   Account Number:  [id number]  Date Initiated:  01/10/2015  Documentation initiated by:  Elissa Hefty  Subjective/Objective Assessment:   adm w angina     Action/Plan:   lives w wife, pcp dr dean mitchell   Anticipated DC Date:     Anticipated DC Plan:  HOME/SELF CARE         Choice offered to / List presented to:             Status of service:   Medicare Important Message given?  YES (If response is "NO", the following Medicare IM given date fields will be blank) Date Medicare IM given:  01/11/2015 Medicare IM given by:  Elissa Hefty Date Additional Medicare IM given:   Additional Medicare IM given by:    Discharge Disposition:    Per UR Regulation:  Reviewed for med. necessity/level of care/duration of stay  If discussed at [place] of Stay Meetings, dates discussed:    Comments:

## 2015-01-11 ENCOUNTER — Encounter (HOSPITAL_COMMUNITY)
Admission: EM | Disposition: A | Discharge status: Home / Self Care | Payer: Self-pay | Source: Home / Self Care | Attending: Interventional Cardiology

## 2015-01-11 DIAGNOSIS — I25110 Atherosclerotic heart disease of native coronary artery with unstable angina pectoris: Principal | ICD-10-CM

## 2015-01-11 DIAGNOSIS — E669 Obesity, unspecified: Secondary | ICD-10-CM

## 2015-01-11 DIAGNOSIS — R079 Chest pain, unspecified: Secondary | ICD-10-CM | POA: Insufficient documentation

## 2015-01-11 HISTORY — PX: LEFT HEART CATHETERIZATION WITH CORONARY ANGIOGRAM: SHX5451

## 2015-01-11 LAB — POCT ACTIVATED CLOTTING TIME
ACTIVATED CLOTTING TIME: 159 s
Activated Clotting Time: 294 seconds
Activated Clotting Time: 196 seconds
Activated Clotting Time: 184 seconds

## 2015-01-11 SURGERY — LEFT HEART CATHETERIZATION WITH CORONARY ANGIOGRAM
Anesthesia: LOCAL

## 2015-01-11 MED ORDER — NITROGLYCERIN 1 MG/10 ML FOR IR/CATH LAB
INTRA_ARTERIAL | Status: AC
  Filled 2015-01-11: qty 10

## 2015-01-11 MED ORDER — HEPARIN SODIUM (PORCINE) 1000 UNIT/ML IJ SOLN
INTRAMUSCULAR | Status: AC
  Filled 2015-01-11: qty 1

## 2015-01-11 MED ORDER — HEPARIN (PORCINE) IN NACL 2-0.9 UNIT/ML-% IJ SOLN
INTRAMUSCULAR | Status: AC
  Filled 2015-01-11: qty 1000

## 2015-01-11 MED ORDER — HYDROCODONE-ACETAMINOPHEN 5-325 MG PO TABS
ORAL_TABLET | ORAL | Status: AC
  Filled 2015-01-11: qty 1

## 2015-01-11 MED ORDER — MIDAZOLAM HCL 2 MG/2ML IJ SOLN
INTRAMUSCULAR | Status: AC
  Filled 2015-01-11: qty 2

## 2015-01-11 MED ORDER — AMLODIPINE BESYLATE 2.5 MG PO TABS
2.5000 mg | ORAL_TABLET | Freq: Once | ORAL | Status: AC
  Administered 2015-01-11: 2.5 mg via ORAL
  Filled 2015-01-11: qty 1

## 2015-01-11 MED ORDER — ADENOSINE 12 MG/4ML IV SOLN
16.0000 mL | Freq: Once | INTRAVENOUS | Status: AC
Start: 2015-01-11 — End: 2015-01-11
  Filled 2015-01-11: qty 16

## 2015-01-11 MED ORDER — VERAPAMIL HCL 2.5 MG/ML IV SOLN
INTRAVENOUS | Status: AC
  Filled 2015-01-11: qty 2

## 2015-01-11 MED ORDER — LIDOCAINE HCL (PF) 1 % IJ SOLN
INTRAMUSCULAR | Status: AC
  Filled 2015-01-11: qty 30

## 2015-01-11 MED ORDER — SODIUM CHLORIDE 0.9 % IV SOLN: 1.0000 mL/kg/h | INTRAVENOUS | Status: AC

## 2015-01-11 MED ORDER — FENTANYL CITRATE 0.05 MG/ML IJ SOLN
INTRAMUSCULAR | Status: AC
  Filled 2015-01-11: qty 2

## 2015-01-11 NOTE — Interval H&P Note (Signed)
History and Physical Interval Note:  01/11/2015 2:22 PM  Andrew Shaw  has presented today for surgery, with the diagnosis of unstable angina  The various methods of treatment have been discussed with the patient and family. After consideration of risks, benefits and other options for treatment, the patient has consented to  Procedure(s): LEFT HEART CATHETERIZATION WITH CORONARY ANGIOGRAM (N/A) as a surgical intervention .  The patient's history has been reviewed, patient examined, no change in status, stable for surgery.  I have reviewed the patient's chart and labs.  Questions were answered to the patient's satisfaction.    Cath Lab Visit (complete for each Cath Lab visit)  Clinical Evaluation Leading to the Procedure:   ACS: Yes.    Non-ACS:    Anginal Classification: CCS IV  Anti-ischemic medical therapy: Maximal Therapy (2 or more classes of medications)  Non-Invasive Test Results: No non-invasive testing performed  Prior CABG: No previous CABG       [name] [facility] 01/11/2015 2:22 PM

## 2015-01-11 NOTE — Progress Notes (Signed)
Site area: Right groin a 6 french arterial sheath was removed  Site Prior to Removal:  Level 0  Pressure Applied For 20 MINUTES    Minutes Beginning at 1745p  Manual:   Yes.    Patient Status During Pull:  stable  Post Pull Groin Site:  Level 0  Post Pull Instructions Given:  Yes.    Post Pull Pulses Present:  Yes.    Dressing Applied:  Yes.    Comments:  VS remain stable during sheath pull.  Pt denies any discomfort at right groin site.

## 2015-01-11 NOTE — Progress Notes (Signed)
Subjective:  Had recurrent chest pain this am.  Objective:   Vital Signs : Filed Vitals:   01/10/15 2215 01/11/15 0100 01/11/15 0400 01/11/15 0748  BP: 161/85 122/64 146/78 146/72  Pulse: 84 60  62  Temp:  98.1 F (36.7 C) 97.8 F (36.6 C) 97.8 F (36.6 C)  TempSrc:  Oral Oral Oral  Resp:   20 18  Height:      Weight:      SpO2:  97% 97% 94%    Intake/Output from previous day:  Intake/Output Summary (Last 24 hours) at 01/11/15 1034 Last data filed at 01/11/15 1018  Gross per 24 hour  Intake    708 ml  Output   1820 ml  Net  -1112 ml    I/O since admission: -2605.8  Wt Readings from Last 3 Encounters:  01/09/15 190 lb 11.2 oz (86.5 kg)  04/26/14 200 lb (90.719 kg)  10/26/13 201 lb (91.173 kg)    Medications: . amLODipine  2.5 mg Oral Daily  . aspirin EC  81 mg Oral Daily  . clopidogrel  75 mg Oral Daily  . fenofibrate  54 mg Oral Daily  . metoprolol  50 mg Oral BID  . OxyCODONE  20 mg Oral 3 times per day  . rosuvastatin  20 mg Oral Daily  . sodium chloride  3 mL Intravenous Q12H    . sodium chloride 75 mL/hr at 01/11/15 0543    Physical Exam:   General appearance: alert, cooperative and no distress Neck: no adenopathy, no carotid bruit, no JVD, supple, symmetrical, trachea midline and thyroid not enlarged, symmetric, no tenderness/mass/nodules Lungs: clear to auscultation bilaterally Heart: regular rate and rhythm Abdomen: moderate central adiposity; soft, non-tender; bowel sounds normal; no masses,  no organomegaly Extremities: no edema, redness or tenderness in the calves or thighs Pulses: 2+ and symmetric Skin: Skin color, texture, turgor normal. No rashes or lesions Neurologic: Grossly normal   Rate: 65  Rhythm: normal sinus rhythm  ECG (independently read by me): Sinue bradycardia without significant ST changes; 1st degree AV block  Lab Results:  BMP Latest Ref Rng 01/09/2015 01/08/2015 12/24/2012  Glucose 70 - 99 mg/dL 110(H) 150(H)  115(H)  BUN 6 - 23 mg/dL <5(L) 7 11  Creatinine 0.50 - 1.35 mg/dL 1.00 1.16 1.02  Sodium 135 - 145 mmol/L 125(L) 127(L) 139  Potassium 3.5 - 5.1 mmol/L 4.2 4.3 3.7  Chloride 96 - 112 mmol/L 95(L) 95(L) 109  CO2 19 - 32 mmol/L 24 23 22   Calcium 8.4 - 10.5 mg/dL 8.8 9.6 9.2     CBC Latest Ref Rng 01/09/2015 01/08/2015 12/24/2012  WBC 4.0 - 10.5 K/uL 6.8 7.4 10.4  Hemoglobin 13.0 - 17.0 g/dL 16.5 15.7 15.6  Hematocrit 39.0 - 52.0 % 45.9 44.2 44.9  Platelets 150 - 400 K/uL 218 241 179     No results for input(s): TROPONINI in the last 72 hours.  Invalid input(s): CK, MB  Hepatic Function Panel No results for input(s): PROT, ALBUMIN, AST, ALT, ALKPHOS, BILITOT, BILIDIR, IBILI in the last 72 hours.  Recent Labs  01/10/15 0226  INR 1.08   BNP (last 3 results)  Recent Labs  01/08/15 1617  BNP 58.2    ProBNP (last 3 results) No results for input(s): PROBNP in the last 8760 hours.   Lipid Panel     Component Value Date/Time   CHOL 146 01/09/2015 0821   TRIG 135 01/09/2015 0821   HDL 44 01/09/2015 0821  CHOLHDL 3.3 01/09/2015 0821   VLDL 27 01/09/2015 0821   LDLCALC 75 01/09/2015 0821      Imaging:  No results found.    Assessment/Plan:   Active Problems:   Unstable angina  1. CAD: S/p BMS to LCX  12/2012. He presented with similar but more intense chest pain. Trop negative. Cath cancelled yesterday due to scheduling issue. On schedule for cath today. As discussed yesterday pt aware of riks/benefits of procedure. 2. Hyperlipidemia on Crestor 20 mg and low dose fenofibrate 54 mg; tg 135., ldl 75 3. HTN:  BP is better but still increased at 423 systolic; will titrate amlodipine to 5 mg. 4. Mild obesity: needs weight loss.    Troy Sine, MD, [facility] 01/11/2015, 10:34 AM

## 2015-01-11 NOTE — CV Procedure (Signed)
   Cardiac Catheterization Procedure Note  Name: Andrew Shaw MRN: [id number] DOB: 1946-10-17  Procedure: Left Heart Cath, Selective Coronary Angiography, LV angiography, FFR of the LCx  Indication: 69 yo WM with prior BMS of the LCx in 2014 presents with symptoms concerning for USAP.    Procedural details: We initially accessed the right radial artery using a modified Seldinger technique. The coronary catheter would not advance and angiography demonstrated a severe radial loop with a recurrent radial artery. We proceeded with femoral access. The right groin was prepped, draped, and anesthetized with 1% lidocaine. Using modified Seldinger technique, a 5 French sheath was introduced into the right femoral artery. Standard Judkins catheters were used for coronary angiography and left ventriculography. Catheter exchanges were performed over a guidewire. There were no immediate procedural complications. The patient was transferred to the post catheterization recovery area for further monitoring.  Procedural Findings: Hemodynamics:  AO 131/70 mean 96 mm Hg LV 128/20 mm Hg   Coronary angiography: Coronary dominance: right  Left mainstem: Normal  Left anterior descending (LAD): There is mild disease in the mid vessel up to 20-30%.   Ramus intermediate: Normal  Left circumflex (LCx): There is a stent in the proximal vessel. This appears to be about 50% but in some views appears worse.   Right coronary artery (RCA): The RCA is a large dominant vessel and is very tortuous. There is diffuse disease in the proximal and mid vessel up to 50%.   Left ventriculography: Left ventricular systolic function is normal, LVEF is estimated at 55-65%, there is no significant mitral regurgitation   Given the uncertain degree of stenosis in the LCx we elected to proceed with FFR of this lesion. The patient was anticoagulated with IV heparin. After a therapeutic ACT was obtained a XBLAD 3.5 guide was used.  The lesion was crossed with a prowater wire. Maximal hyperemia was obtained with IV Adenosine infusion. The patient had a good clinical response. FFR was 0.87. This indicates that the lesion is not hemodynamically significant.   Final Conclusions:   1. Nonobstructive CAD. There is diffuse but nonobstructive disease in the RCA. The LCx has an in-stent stenosis of indeterminent severity but FFR is 0.87 suggesting it is not hemodynamically significant.  2. Normal LV function.  Recommendations: Continue medical therapy. May need to consider alternate causes for his chest pain.  Andrew Shaw, [place] 01/11/2015, 3:30 PM

## 2015-01-11 NOTE — Progress Notes (Signed)
Patient arrived from the cath lab via stretcher. Patient alert and oriented, denies pain at this time. Patient moved to room bed, groin site and radial site assessed - both level zero. Vitals obtained, orders released and reviewed. Will continue to monitor. - Soyla Dryer, RN

## 2015-01-11 NOTE — Progress Notes (Signed)
Patient transported off unit to cath lab via bed at 1400 Wife and family members in waiting room during procedure

## 2015-01-11 NOTE — H&P (View-Only) (Signed)
Subjective:  Had recurrent chest pain this am.  Objective:   Vital Signs : Filed Vitals:   01/10/15 2215 01/11/15 0100 01/11/15 0400 01/11/15 0748  BP: 161/85 122/64 146/78 146/72  Pulse: 84 60  62  Temp:  98.1 F (36.7 C) 97.8 F (36.6 C) 97.8 F (36.6 C)  TempSrc:  Oral Oral Oral  Resp:   20 18  Height:      Weight:      SpO2:  97% 97% 94%    Intake/Output from previous day:  Intake/Output Summary (Last 24 hours) at 01/11/15 1034 Last data filed at 01/11/15 1018  Gross per 24 hour  Intake    708 ml  Output   1820 ml  Net  -1112 ml    I/O since admission: -2605.8  Wt Readings from Last 3 Encounters:  01/09/15 190 lb 11.2 oz (86.5 kg)  04/26/14 200 lb (90.719 kg)  10/26/13 201 lb (91.173 kg)    Medications: . amLODipine  2.5 mg Oral Daily  . aspirin EC  81 mg Oral Daily  . clopidogrel  75 mg Oral Daily  . fenofibrate  54 mg Oral Daily  . metoprolol  50 mg Oral BID  . OxyCODONE  20 mg Oral 3 times per day  . rosuvastatin  20 mg Oral Daily  . sodium chloride  3 mL Intravenous Q12H    . sodium chloride 75 mL/hr at 01/11/15 0543    Physical Exam:   General appearance: alert, cooperative and no distress Neck: no adenopathy, no carotid bruit, no JVD, supple, symmetrical, trachea midline and thyroid not enlarged, symmetric, no tenderness/mass/nodules Lungs: clear to auscultation bilaterally Heart: regular rate and rhythm Abdomen: moderate central adiposity; soft, non-tender; bowel sounds normal; no masses,  no organomegaly Extremities: no edema, redness or tenderness in the calves or thighs Pulses: 2+ and symmetric Skin: Skin color, texture, turgor normal. No rashes or lesions Neurologic: Grossly normal   Rate: 65  Rhythm: normal sinus rhythm  ECG (independently read by me): Sinue bradycardia without significant ST changes; 1st degree AV block  Lab Results:  BMP Latest Ref Rng 01/09/2015 01/08/2015 12/24/2012  Glucose 70 - 99 mg/dL 110(H) 150(H)  115(H)  BUN 6 - 23 mg/dL <5(L) 7 11  Creatinine 0.50 - 1.35 mg/dL 1.00 1.16 1.02  Sodium 135 - 145 mmol/L 125(L) 127(L) 139  Potassium 3.5 - 5.1 mmol/L 4.2 4.3 3.7  Chloride 96 - 112 mmol/L 95(L) 95(L) 109  CO2 19 - 32 mmol/L 24 23 22   Calcium 8.4 - 10.5 mg/dL 8.8 9.6 9.2     CBC Latest Ref Rng 01/09/2015 01/08/2015 12/24/2012  WBC 4.0 - 10.5 K/uL 6.8 7.4 10.4  Hemoglobin 13.0 - 17.0 g/dL 16.5 15.7 15.6  Hematocrit 39.0 - 52.0 % 45.9 44.2 44.9  Platelets 150 - 400 K/uL 218 241 179     No results for input(s): TROPONINI in the last 72 hours.  Invalid input(s): CK, MB  Hepatic Function Panel No results for input(s): PROT, ALBUMIN, AST, ALT, ALKPHOS, BILITOT, BILIDIR, IBILI in the last 72 hours.  Recent Labs  01/10/15 0226  INR 1.08   BNP (last 3 results)  Recent Labs  01/08/15 1617  BNP 58.2    ProBNP (last 3 results) No results for input(s): PROBNP in the last 8760 hours.   Lipid Panel     Component Value Date/Time   CHOL 146 01/09/2015 0821   TRIG 135 01/09/2015 0821   HDL 44 01/09/2015 0821  CHOLHDL 3.3 01/09/2015 0821   VLDL 27 01/09/2015 0821   LDLCALC 75 01/09/2015 0821      Imaging:  No results found.    Assessment/Plan:   Active Problems:   Unstable angina  1. CAD: S/p BMS to LCX  12/2012. He presented with similar but more intense chest pain. Trop negative. Cath cancelled yesterday due to scheduling issue. On schedule for cath today. As discussed yesterday pt aware of riks/benefits of procedure. 2. Hyperlipidemia on Crestor 20 mg and low dose fenofibrate 54 mg; tg 135., ldl 75 3. HTN:  BP is better but still increased at 793 systolic; will titrate amlodipine to 5 mg. 4. Mild obesity: needs weight loss.    Troy Sine, MD, [facility] 01/11/2015, 10:34 AM

## 2015-01-12 ENCOUNTER — Encounter (HOSPITAL_COMMUNITY): Payer: Self-pay | Admitting: Cardiology

## 2015-01-12 DIAGNOSIS — E785 Hyperlipidemia, unspecified: Secondary | ICD-10-CM

## 2015-01-12 DIAGNOSIS — I2510 Atherosclerotic heart disease of native coronary artery without angina pectoris: Secondary | ICD-10-CM

## 2015-01-12 LAB — BASIC METABOLIC PANEL
Anion gap: 7 (ref 5–15)
BUN: 8 mg/dL (ref 6–23)
CHLORIDE: 101 mmol/L (ref 96–112)
CO2: 21 mmol/L (ref 19–32)
Calcium: 9 mg/dL (ref 8.4–10.5)
Creatinine, Ser: 0.92 mg/dL (ref 0.50–1.35)
GFR calc Af Amer: >90 mL/min (ref >90)
GFR calc non Af Amer: 85 mL/min — ABNORMAL LOW (ref >90)
Glucose, Bld: 110 mg/dL — ABNORMAL HIGH (ref 70–99)
Potassium: 3.8 mmol/L (ref 3.5–5.1)
SODIUM: 129 mmol/L — AB (ref 135–145)

## 2015-01-12 LAB — CBC
HCT: 43.7 % (ref 39.0–52.0)
HEMOGLOBIN: 15.4 g/dL (ref 13.0–17.0)
MCH: 30.9 pg (ref 26.0–34.0)
MCHC: 35.2 g/dL (ref 30.0–36.0)
MCV: 87.6 fL (ref 78.0–100.0)
Platelets: 229 10*3/uL (ref 150–400)
RBC: 4.99 MIL/uL (ref 4.22–5.81)
RDW: 13.2 % (ref 11.5–15.5)
WBC: 7 10*3/uL (ref 4.0–10.5)

## 2015-01-12 LAB — D-DIMER, QUANTITATIVE (NOT AT ARMC): D DIMER QUANT: 0.69 ug{FEU}/mL — AB (ref 0.00–0.48)

## 2015-01-12 NOTE — Discharge Instructions (Signed)
PLEASE REMEMBER TO BRING ALL OF YOUR MEDICATIONS TO EACH OF YOUR FOLLOW-UP OFFICE VISITS. ° °PLEASE ATTEND ALL SCHEDULED FOLLOW-UP APPOINTMENTS.  ° °Activity: Increase activity slowly as tolerated. You may shower, but no soaking baths (or swimming) for 1 week. No driving for 2 days. No lifting over 5 lbs for 1 week. No sexual activity for 1 week.  ° °You May Return to Work: in 1 week (if applicable) ° °Wound Care: You may wash cath site gently with soap and water. Keep cath site clean and dry. If you notice pain, swelling, bleeding or pus at your cath site, please call [phone]. ° ° ° °Cardiac Cath Site Care °Refer to this sheet in the next few weeks. These instructions provide you with information on caring for yourself after your procedure. Your caregiver may also give you more specific instructions. Your treatment has been planned according to current medical practices, but problems sometimes occur. Call your caregiver if you have any problems or questions after your procedure. °HOME CARE INSTRUCTIONS °· You may shower 24 hours after the procedure. Remove the bandage (dressing) and gently wash the site with plain soap and water. Gently pat the site dry.  °· Do not apply powder or lotion to the site.  °· Do not sit in a bathtub, swimming pool, or whirlpool for 5 to 7 days.  °· No bending, squatting, or lifting anything over 10 pounds (4.5 kg) as directed by your caregiver.  °· Inspect the site at least twice daily.  °· Do not drive home if you are discharged the same day of the procedure. Have someone else drive you.  °· You may drive 24 hours after the procedure unless otherwise instructed by your caregiver.  °What to expect: °· Any bruising will usually fade within 1 to 2 weeks.  °· Blood that collects in the tissue (hematoma) may be painful to the touch. It should usually decrease in size and tenderness within 1 to 2 weeks.  °SEEK IMMEDIATE MEDICAL CARE IF: °· You have unusual pain at the site or down the  affected limb.  °· You have redness, warmth, swelling, or pain at the site.  °· You have drainage (other than a small amount of blood on the dressing).  °· You have chills.  °· You have a fever or persistent symptoms for more than 72 hours.  °· You have a fever and your symptoms suddenly get worse.  °· Your leg becomes pale, cool, tingly, or numb.  °· You have heavy bleeding from the site. Hold pressure on the site.  °Document Released: 12/01/2010 Document Revised: 10/18/2011 Document Reviewed: 12/01/2010 °ExitCare® Patient Information ©2012 [employer]. ° °

## 2015-01-12 NOTE — Discharge Summary (Signed)
CARDIOLOGY DISCHARGE SUMMARY   Patient ID: Andrew Shaw MRN: [id number] DOB/AGE: 69-Oct-1947 69 y.o.  Admit date: 01/08/2015 Discharge date: 01/12/2015  PCP: [name], MD Primary Cardiologist: Dr. Gwenlyn Found  Primary Discharge Diagnosis:  Unstable angina - Medical therapy for CAD Secondary Discharge Diagnosis:    Pain in the chest  Procedures: Left Heart Cath, Selective Coronary Angiography, LV angiography, FFR of the LCx   Hospital Course: Andrew Shaw is a 69 y.o. male with a history of CAD, BMS to the CFX in 2014, HTN, and  HLD. He was admitted 02/27 with possible USAP.    His cardiac enzymes were negative for MI. He has known CAD, so the best option was felt cardiac catheterization to further define his anatomy.  Cardiac catheterization results are below. He had a lesion in the CFX that was possibly significant, but FFR was 0.81, so medical therapy is the best option.  He had been started on a low dose of Norvasc, but his ARB was held on admission. The Avapro will be restarted and Dr. Gwenlyn Found can start the Norvasc if anti-anginal therapy or improved BP control is required.  On 01/12/2015, Andrew Shaw was seen by Dr. Radford Pax and all data were reviewed. To assess for other causes of his chest pain, he got a d-dimer. It was slightly elevated, but a d-dimer obtained on admission was negative. He ambulated and tolerated this well. It was recommended that he get a CTA of the chest to further evaluate him, but he did not wish to get this as an inpatient. He has an early follow-up with Dr. Gwenlyn Found and will discuss this with him. He is ambulating well and will therefore be considered stable for discharge, to follow up as an outpatient.   BP 128/62 mmHg  Pulse 61  Temp(Src) 98.2 F (36.8 C) (Oral)  Resp 18  Ht 5\' 7"  (1.702 m)  Wt 190 lb 11.2 oz (86.5 kg)  BMI 29.86 kg/m2  SpO2 97% General: Well developed, well nourished, male in no acute distress Head: Eyes PERRLA, No  xanthomas.   Normocephalic and atraumatic  Lungs: Clear bilaterally to auscultation. Heart: HRRR S1 S2, without MRG.  Pulses are 2+ & equal. No carotid bruit. No JVD. Abdomen: Bowel sounds are present, abdomen soft and non-tender without masses or  hernias noted. Msk: Normal strength and tone for age. Extremities: No clubbing, cyanosis or edema.  Right radial and right groin cath sites were without hematoma.  Skin:  No rashes or lesions noted. Neuro: Alert and oriented X 3. Psych:  Good affect, responds appropriately   Labs:   Lab Results  Component Value Date   WBC 7.0 01/12/2015   HGB 15.4 01/12/2015   HCT 43.7 01/12/2015   MCV 87.6 01/12/2015   PLT 229 01/12/2015     Recent Labs Lab 01/12/15 0430  NA 129*  K 3.8  CL 101  CO2 21  BUN 8  CREATININE 0.92  CALCIUM 9.0  GLUCOSE 110*    Recent Labs  01/10/15 1045  CKTOTAL 62  CKMB 1.5   Lipid Panel     Component Value Date/Time   CHOL 146 01/09/2015 0821   TRIG 135 01/09/2015 0821   HDL 44 01/09/2015 0821   CHOLHDL 3.3 01/09/2015 0821   VLDL 27 01/09/2015 0821   LDLCALC 75 01/09/2015 0821    B NATRIURETIC PEPTIDE  Date/Time Value Ref Range Status  01/08/2015 04:17 PM 58.2 0.0 - 100.0 pg/mL Final  Recent Labs  01/10/15 0226  INR 1.08      Radiology: Dg Chest 2 View 01/08/2015   CLINICAL DATA:  Mid sternal chest pain radiating to back. Shortness of breath, dizziness, nausea.  EXAM: CHEST  2 VIEW  COMPARISON:  12/22/2012  FINDINGS: Mild peribronchial thickening. Heart and mediastinal contours are within normal limits. No focal opacities or effusions. No acute bony abnormality.  IMPRESSION: Mild bronchitic changes.   Electronically Signed   By: Rolm Baptise M.D.   On: 01/08/2015 17:58    Cardiac Cath: 01/11/2015 Left mainstem: Normal Left anterior descending (LAD): There is mild disease in the mid vessel up to 20-30%.  Ramus intermediate: Normal Left circumflex (LCx): There is a stent in the proximal  vessel. This appears to be about 50% but in some views appears worse.  Right coronary artery (RCA): The RCA is a large dominant vessel and is very tortuous. There is diffuse disease in the proximal and mid vessel up to 50%.  Left ventriculography: Left ventricular systolic function is normal, LVEF is estimated at 55-65%, there is no significant mitral regurgitation  Given the uncertain degree of stenosis in the LCx we elected to proceed with FFR of this lesion. The patient was anticoagulated with IV heparin. After a therapeutic ACT was obtained a XBLAD 3.5 guide was used. The lesion was crossed with a prowater wire. Maximal hyperemia was obtained with IV Adenosine infusion. The patient had a good clinical response. FFR was 0.87. This indicates that the lesion is not hemodynamically significant.  Final Conclusions:  1. Nonobstructive CAD. There is diffuse but nonobstructive disease in the RCA. The LCx has an in-stent stenosis of indeterminent severity but FFR is 0.87 suggesting it is not hemodynamically significant.  2. Normal LV function. Recommendations: Continue medical therapy. May need to consider alternate causes for his chest pain  EKG: 01/10/2015 Sinus brady w/ 1st degree AV block  FOLLOW UP PLANS AND APPOINTMENTS Allergies  Allergen Reactions  . Lisinopril Rash and Other (See Comments)    Swelling of face  . Antihistamines, Chlorpheniramine-Type Itching  . Cortisone     MI  . Flexeril [Cyclobenzaprine] Itching     Medication List    TAKE these medications        aspirin EC 81 MG tablet  Take 81 mg by mouth daily.     clopidogrel 75 MG tablet  Commonly known as:  PLAVIX  TAKE 1 TABLET BY MOUTH EVERY DAY     fenofibrate 48 MG tablet  Commonly known as:  TRICOR  Take 48 mg by mouth daily.     HYDROcodone-acetaminophen 10-325 MG per tablet  Commonly known as:  NORCO  Take 1 tablet by mouth every 4 (four) hours as needed (breakthrough pain).     irbesartan 150 MG  tablet  Commonly known as:  AVAPRO  TAKE 1 TABLET BY MOUTH DAILY     metoprolol 50 MG tablet  Commonly known as:  LOPRESSOR  TAKE 1 TABLET BY MOUTH TWICE DAILY     nitroGLYCERIN 0.4 MG SL tablet  Commonly known as:  NITROSTAT  Place 1 tablet (0.4 mg total) under the tongue every 5 (five) minutes x 3 doses as needed for chest pain.     OXYCONTIN 20 mg T12a 12 hr tablet  Generic drug:  OxyCODONE  Take 20 mg by mouth every 8 (eight) hours.     rosuvastatin 20 MG tablet  Commonly known as:  CRESTOR  Take 20 mg by mouth daily.  Discharge Instructions    Diet - low sodium heart healthy    Complete by:  As directed      Increase activity slowly    Complete by:  As directed           Follow-up Information    Follow up with Lorretta Harp, MD.   Specialty:  Cardiology   Why:  Keep 03/04 appointment.   Contact information:   [place] Start [phone]       BRING ALL MEDICATIONS WITH YOU TO FOLLOW UP APPOINTMENTS  Time spent with patient to include physician time: 36 min Signed: Rosaria Ferries, PA-C 01/12/2015, 4:11 PM Co-Sign MD

## 2015-01-14 ENCOUNTER — Ambulatory Visit: Payer: Self-pay

## 2015-01-14 ENCOUNTER — Other Ambulatory Visit: Payer: Self-pay | Admitting: Cardiovascular Disease

## 2015-01-14 ENCOUNTER — Ambulatory Visit (HOSPITAL_COMMUNITY)
Admission: RE | Admit: 2015-01-14 | Discharge: 2015-01-14 | Disposition: A | Discharge status: Home / Self Care | Payer: Medicare Other | Source: Ambulatory Visit | Attending: Cardiovascular Disease | Admitting: Cardiovascular Disease

## 2015-01-14 ENCOUNTER — Encounter: Payer: Self-pay | Admitting: Cardiovascular Disease

## 2015-01-14 ENCOUNTER — Ambulatory Visit (INDEPENDENT_AMBULATORY_CARE_PROVIDER_SITE_OTHER): Payer: Medicare Other | Admitting: Cardiovascular Disease

## 2015-01-14 ENCOUNTER — Ambulatory Visit
Admission: RE | Admit: 2015-01-14 | Discharge: 2015-01-14 | Disposition: A | Discharge status: Home / Self Care | Payer: Medicare Other | Source: Ambulatory Visit | Attending: Cardiovascular Disease | Admitting: Cardiovascular Disease

## 2015-01-14 ENCOUNTER — Telehealth: Payer: Self-pay | Admitting: Physician Assistant

## 2015-01-14 VITALS — BP 128/60 | HR 60 | Ht 67.0 in | Wt 198.6 lb

## 2015-01-14 DIAGNOSIS — R0789 Other chest pain: Principal | ICD-10-CM

## 2015-01-14 DIAGNOSIS — R079 Chest pain, unspecified: Secondary | ICD-10-CM

## 2015-01-14 DIAGNOSIS — M79609 Pain in unspecified limb: Secondary | ICD-10-CM | POA: Diagnosis not present

## 2015-01-14 DIAGNOSIS — I82402 Acute embolism and thrombosis of unspecified deep veins of left lower extremity: Secondary | ICD-10-CM | POA: Diagnosis not present

## 2015-01-14 DIAGNOSIS — M79605 Pain in left leg: Secondary | ICD-10-CM | POA: Insufficient documentation

## 2015-01-14 DIAGNOSIS — R590 Localized enlarged lymph nodes: Secondary | ICD-10-CM | POA: Diagnosis not present

## 2015-01-14 DIAGNOSIS — R0602 Shortness of breath: Secondary | ICD-10-CM | POA: Diagnosis not present

## 2015-01-14 MED ORDER — IOPAMIDOL (ISOVUE-370) INJECTION 76%
80.0000 mL | Freq: Once | INTRAVENOUS | Status: AC | PRN
  Administered 2015-01-14: 80 mL via INTRAVENOUS

## 2015-01-14 NOTE — Progress Notes (Signed)
Left Lower Extremity Venous Duplex Completed. [employer]

## 2015-01-14 NOTE — Assessment & Plan Note (Signed)
History of CAD status post stenting of his circumflex bare-metal stent by myself performed radially 12/23/12 otherwise noncritical CAD. He recently was admitted with chest pain and underwent cardiac catheterization formerly by Dr. 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 It should be noted that he did have some left calf pain initially which also resulted in pleuritic chest pain. His d-dimer was elevated at 0.69. I'm going to get left lower extremity venous duplex on him today as well as a CT exam to rule out pulmonary embolus.

## 2015-01-14 NOTE — Telephone Encounter (Signed)
Attempted to give report to patient again at 8:14pm, no answer.  Hilbert Corrigan PA Pager: [phone]

## 2015-01-14 NOTE — Patient Instructions (Signed)
Your physician has requested that you have a lower extremity venous duplex (already completed). This test is an ultrasound of the veins in the legs or arms. It looks at venous blood flow that carries blood from the heart to the legs or arms. Allow one hour for a Lower Venous exam. Allow thirty minutes for an Upper Venous exam. There are no restrictions or special instructions.  Non-Cardiac CT Angiography (CTA), is a special type of CT scan that uses a computer to produce multi-dimensional views of major blood vessels throughout the body. In CT angiography, a contrast material is injected through an IV to help visualize the blood vessels.  Your physician wants you to follow-up in 6 months with Dr. Gwenlyn Found. You will receive a reminder letter in the mail 2 months in advance. If you do not receive a letter, please call our office to schedule the follow-up appointment.

## 2015-01-14 NOTE — Assessment & Plan Note (Signed)
History of hypertension blood pressure measured at 128/60. He is on Avapro and metoprolol. Continue current meds at current dosing

## 2015-01-14 NOTE — Assessment & Plan Note (Signed)
History of hyperlipidemia on rosuvastatin 20 mg a day with recent lipid profile performed 01/09/15 revealing a total cholesterol 146, LDL 75 and HDL of 44

## 2015-01-14 NOTE — Telephone Encounter (Signed)
Contacted by [place] regarding CTA of chest result, no sign of PE, nonspecific mild mediastinal and right hilar lymphadenopathy  Attempted to give report to patient, however he did not answer phone. Will try again later.  Hilbert Corrigan PA Pager: [phone]

## 2015-01-14 NOTE — Progress Notes (Signed)
01/14/2015 Andrew Shaw   [birth year]  [id number]  Primary Physician [name], MD Primary Cardiologist: Lorretta Harp MD Renae Gloss   HPI:  The patient is a 69 year old, overweight Caucasian male with history of coronary artery disease with moderate coronary artery disease by prior cath in 2005.I last saw him in the office 04/26/14. He had a diagnostic cath December 23, 2012, which revealed 90% circumflex stenosis, 50% to 60% RCA stenosis in the mid vessel. He had a normal LAD and normal left main. His ejection fraction at that time was 60% without wall motion abnormality. He underwent bare-metal stenting of the circumflex. He had an issue with hematochezia during admission or complained of it. He is doing well today but at his previous office visit on January 06, 2013, he was complaining of some angioedema and rash on his face as well as some facial swelling. At that time, he had been started on lisinopril and Brilinta. Lisinopril was stopped by Kerin Ransom, and so I am seeing him today in followup. The patient still has a little bit of redness in his face; however, the angioedema and periorbital edema has essentially cleared up. He denies any nausea, vomiting, shortness of breath, dizziness, orthopnea, chest pain, lower extremity edema, facial edema.he developed some left calf pain followed by chest pain which resulted in admission to [facility]. He ruled out for myocardial infarction and underwent diagnostic clinic after Dr. 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 He did have a d-dimer 0.69, minimally elevated.  Current Outpatient Prescriptions  Medication Sig Dispense Refill  . aspirin EC 81 MG tablet Take 81 mg by mouth daily.    . clopidogrel (PLAVIX) 75 MG tablet TAKE 1 TABLET BY MOUTH EVERY DAY 30 tablet 5  . fenofibrate (TRICOR) 48 MG tablet Take  48 mg by mouth daily.    Marland Kitchen HYDROcodone-acetaminophen (NORCO) 10-325 MG per tablet Take 1 tablet by mouth every 4 (four) hours as needed (breakthrough pain).     . irbesartan (AVAPRO) 150 MG tablet TAKE 1 TABLET BY MOUTH DAILY 30 tablet 10  . metoprolol (LOPRESSOR) 50 MG tablet TAKE 1 TABLET BY MOUTH TWICE DAILY 60 tablet 5  . nitroGLYCERIN (NITROSTAT) 0.4 MG SL tablet Place 1 tablet (0.4 mg total) under the tongue every 5 (five) minutes x 3 doses as needed for chest pain. 25 tablet 2  . OXYCONTIN 20 MG T12A 12 hr tablet Take 20 mg by mouth every 8 (eight) hours.  0  . rosuvastatin (CRESTOR) 20 MG tablet Take 20 mg by mouth daily.     No current facility-administered medications for this visit.    Allergies  Allergen Reactions  . Lisinopril Rash and Other (See Comments)    Swelling of face  . Antihistamines, Chlorpheniramine-Type Itching  . Cortisone     MI  . Flexeril [Cyclobenzaprine] Itching    History   Social History  . Marital Status: Married    Spouse Name: N/A  . Number of Children: N/A  . Years of Education: N/A   Occupational History  . Not on file.   Social History Main Topics  . Smoking status: Current Some Day Smoker -- 0.25 packs/day for 50 years    Types: Cigarettes  . Smokeless tobacco: Never Used  . Alcohol Use: No  . Drug Use: No  . Sexual Activity: Not on file   Other Topics Concern  .  Not on file   Social History Narrative     Review of Systems: General: negative for chills, fever, night sweats or weight changes.  Cardiovascular: negative for chest pain, dyspnea on exertion, edema, orthopnea, palpitations, paroxysmal nocturnal dyspnea or shortness of breath Dermatological: negative for rash Respiratory: negative for cough or wheezing Urologic: negative for hematuria Abdominal: negative for nausea, vomiting, diarrhea, bright red blood per rectum, melena, or hematemesis Neurologic: negative for visual changes, syncope, or dizziness All other  systems reviewed and are otherwise negative except as noted above.    Blood pressure 128/60, pulse 60, height 5\' 7"  (1.702 m), weight 198 lb 9.6 oz (90.084 kg).  General appearance: alert and no distress Neck: no adenopathy, no carotid bruit, no JVD, supple, symmetrical, trachea midline and thyroid not enlarged, symmetric, no tenderness/mass/nodules Lungs: clear to auscultation bilaterally Heart: regular rate and rhythm, S1, S2 normal, no murmur, click, rub or gallop Extremities: extremities normal, atraumatic, no cyanosis or edema  EKG not performed today  ASSESSMENT AND PLAN:   S/P angioplasty with stent, BMS to LCX  12/23/12 History of CAD status post stenting of his circumflex bare-metal stent by myself performed radially 12/23/12 otherwise noncritical CAD. He recently was admitted with chest pain and underwent cardiac catheterization formerly by Dr. 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 It should be noted that he did have some left calf pain initially which also resulted in pleuritic chest pain. His d-dimer was elevated at 0.69. I'm going to get left lower extremity venous duplex on him today as well as a CT exam to rule out pulmonary embolus.   HTN (hypertension) History of hypertension blood pressure measured at 128/60. He is on Avapro and metoprolol. Continue current meds at current dosing   Dyslipidemia History of hyperlipidemia on rosuvastatin 20 mg a day with recent lipid profile performed 01/09/15 revealing a total cholesterol 146, LDL 75 and HDL of [place] MD [facility] 01/14/2015 2:47 PM

## 2015-01-16 ENCOUNTER — Telehealth: Payer: Self-pay | Admitting: Physician Assistant

## 2015-01-16 NOTE — Telephone Encounter (Signed)
Could not reach Mr. Gae Dry on Friday, called patient today and updated him on the result of CTA of chest  Signed, Almyra Deforest PA Pager: [phone]

## 2015-01-21 ENCOUNTER — Telehealth: Payer: Self-pay | Admitting: *Deleted

## 2015-01-21 DIAGNOSIS — Z01818 Encounter for other preprocedural examination: Secondary | ICD-10-CM

## 2015-01-21 DIAGNOSIS — R590 Localized enlarged lymph nodes: Secondary | ICD-10-CM

## 2015-01-21 NOTE — Telephone Encounter (Signed)
Order for CTA in 3 months placed. Pt is aware and was told he will be contacted to schedule the procedure. Lab orders have been placed as well; slips will be mailed.

## 2015-01-21 NOTE — Telephone Encounter (Signed)
-----   Message from Lorretta Harp, MD sent at 01/15/2015  4:23 PM EST ----- No PE. Repeat CT 3 months per radiology recs because of mediastinal adenopathy

## 2015-01-31 DIAGNOSIS — M47812 Spondylosis without myelopathy or radiculopathy, cervical region: Secondary | ICD-10-CM | POA: Diagnosis not present

## 2015-01-31 DIAGNOSIS — Z79891 Long term (current) use of opiate analgesic: Secondary | ICD-10-CM | POA: Diagnosis not present

## 2015-01-31 DIAGNOSIS — M4726 Other spondylosis with radiculopathy, lumbar region: Secondary | ICD-10-CM | POA: Diagnosis not present

## 2015-01-31 DIAGNOSIS — M5481 Occipital neuralgia: Secondary | ICD-10-CM | POA: Diagnosis not present

## 2015-01-31 DIAGNOSIS — G894 Chronic pain syndrome: Secondary | ICD-10-CM | POA: Diagnosis not present

## 2015-02-08 DIAGNOSIS — L905 Scar conditions and fibrosis of skin: Secondary | ICD-10-CM | POA: Diagnosis not present

## 2015-02-08 DIAGNOSIS — C44622 Squamous cell carcinoma of skin of right upper limb, including shoulder: Secondary | ICD-10-CM | POA: Diagnosis not present

## 2015-02-28 DIAGNOSIS — M47812 Spondylosis without myelopathy or radiculopathy, cervical region: Secondary | ICD-10-CM | POA: Diagnosis not present

## 2015-02-28 DIAGNOSIS — M5481 Occipital neuralgia: Secondary | ICD-10-CM | POA: Diagnosis not present

## 2015-02-28 DIAGNOSIS — G894 Chronic pain syndrome: Secondary | ICD-10-CM | POA: Diagnosis not present

## 2015-02-28 DIAGNOSIS — M4726 Other spondylosis with radiculopathy, lumbar region: Secondary | ICD-10-CM | POA: Diagnosis not present

## 2015-03-10 DIAGNOSIS — L820 Inflamed seborrheic keratosis: Secondary | ICD-10-CM | POA: Diagnosis not present

## 2015-03-10 DIAGNOSIS — Z85828 Personal history of other malignant neoplasm of skin: Secondary | ICD-10-CM | POA: Diagnosis not present

## 2015-03-10 DIAGNOSIS — L570 Actinic keratosis: Secondary | ICD-10-CM | POA: Diagnosis not present

## 2015-03-10 DIAGNOSIS — C44622 Squamous cell carcinoma of skin of right upper limb, including shoulder: Secondary | ICD-10-CM | POA: Diagnosis not present

## 2015-03-10 DIAGNOSIS — Z08 Encounter for follow-up examination after completed treatment for malignant neoplasm: Secondary | ICD-10-CM | POA: Diagnosis not present

## 2015-03-28 DIAGNOSIS — M47812 Spondylosis without myelopathy or radiculopathy, cervical region: Secondary | ICD-10-CM | POA: Diagnosis not present

## 2015-03-28 DIAGNOSIS — Z79891 Long term (current) use of opiate analgesic: Secondary | ICD-10-CM | POA: Diagnosis not present

## 2015-03-28 DIAGNOSIS — M4726 Other spondylosis with radiculopathy, lumbar region: Secondary | ICD-10-CM | POA: Diagnosis not present

## 2015-03-28 DIAGNOSIS — G894 Chronic pain syndrome: Secondary | ICD-10-CM | POA: Diagnosis not present

## 2015-04-03 ENCOUNTER — Other Ambulatory Visit: Payer: Self-pay | Admitting: Cardiovascular Disease

## 2015-04-04 NOTE — Telephone Encounter (Signed)
Rx(s) sent to pharmacy electronically.  

## 2015-04-10 ENCOUNTER — Other Ambulatory Visit: Payer: Self-pay | Admitting: Cardiovascular Disease

## 2015-04-12 NOTE — Telephone Encounter (Signed)
Rx(s) sent to pharmacy electronically.  

## 2015-04-22 ENCOUNTER — Ambulatory Visit
Admission: RE | Admit: 2015-04-22 | Discharge: 2015-04-22 | Disposition: A | Discharge status: Home / Self Care | Payer: Medicare Other | Source: Ambulatory Visit | Attending: Cardiovascular Disease | Admitting: Cardiovascular Disease

## 2015-04-22 DIAGNOSIS — R590 Localized enlarged lymph nodes: Secondary | ICD-10-CM | POA: Diagnosis not present

## 2015-04-22 DIAGNOSIS — I2510 Atherosclerotic heart disease of native coronary artery without angina pectoris: Secondary | ICD-10-CM | POA: Diagnosis not present

## 2015-04-22 MED ORDER — IOPAMIDOL (ISOVUE-370) INJECTION 76%
80.0000 mL | Freq: Once | INTRAVENOUS | Status: AC | PRN
  Administered 2015-04-22: 80 mL via INTRAVENOUS

## 2015-04-25 DIAGNOSIS — Z79891 Long term (current) use of opiate analgesic: Secondary | ICD-10-CM | POA: Diagnosis not present

## 2015-04-25 DIAGNOSIS — M47812 Spondylosis without myelopathy or radiculopathy, cervical region: Secondary | ICD-10-CM | POA: Diagnosis not present

## 2015-04-25 DIAGNOSIS — M4726 Other spondylosis with radiculopathy, lumbar region: Secondary | ICD-10-CM | POA: Diagnosis not present

## 2015-04-25 DIAGNOSIS — G894 Chronic pain syndrome: Secondary | ICD-10-CM | POA: Diagnosis not present

## 2015-04-26 ENCOUNTER — Encounter: Payer: Self-pay | Admitting: Cardiovascular Disease

## 2015-04-27 DIAGNOSIS — L309 Dermatitis, unspecified: Secondary | ICD-10-CM | POA: Diagnosis not present

## 2015-04-27 DIAGNOSIS — Z85828 Personal history of other malignant neoplasm of skin: Secondary | ICD-10-CM | POA: Diagnosis not present

## 2015-04-27 DIAGNOSIS — L570 Actinic keratosis: Secondary | ICD-10-CM | POA: Diagnosis not present

## 2015-04-27 DIAGNOSIS — Z08 Encounter for follow-up examination after completed treatment for malignant neoplasm: Secondary | ICD-10-CM | POA: Diagnosis not present

## 2015-05-02 DIAGNOSIS — R590 Localized enlarged lymph nodes: Secondary | ICD-10-CM | POA: Diagnosis not present

## 2015-05-06 ENCOUNTER — Encounter: Payer: Self-pay | Admitting: Internal Medicine

## 2015-05-06 ENCOUNTER — Other Ambulatory Visit (INDEPENDENT_AMBULATORY_CARE_PROVIDER_SITE_OTHER): Payer: Medicare Other

## 2015-05-06 ENCOUNTER — Ambulatory Visit (INDEPENDENT_AMBULATORY_CARE_PROVIDER_SITE_OTHER): Payer: Medicare Other | Admitting: Internal Medicine

## 2015-05-06 VITALS — BP 120/70 | HR 65 | Ht 66.0 in | Wt 190.0 lb

## 2015-05-06 DIAGNOSIS — R590 Localized enlarged lymph nodes: Secondary | ICD-10-CM | POA: Insufficient documentation

## 2015-05-06 DIAGNOSIS — R938 Abnormal findings on diagnostic imaging of other specified body structures: Secondary | ICD-10-CM

## 2015-05-06 DIAGNOSIS — Z720 Tobacco use: Secondary | ICD-10-CM | POA: Diagnosis not present

## 2015-05-06 DIAGNOSIS — R9389 Abnormal findings on diagnostic imaging of other specified body structures: Secondary | ICD-10-CM

## 2015-05-06 DIAGNOSIS — Z87891 Personal history of nicotine dependence: Secondary | ICD-10-CM

## 2015-05-06 DIAGNOSIS — R599 Enlarged lymph nodes, unspecified: Secondary | ICD-10-CM

## 2015-05-06 LAB — SEDIMENTATION RATE: Sed Rate: 12 mm/hr (ref 0–22)

## 2015-05-06 NOTE — Patient Instructions (Addendum)
ICD-9-CM ICD-10-CM   1. Smoking history V15.82 Z72.0   2. Mediastinal adenopathy 785.6 R59.9   3. Abnormal CT scan, chest 793.2 R93.8    Do full PFT breathing test anytime next few weeks Do ANA, RF, CCP, ACE, ESR levels blood test 05/06/2015  Get dermatology chart from Dr. Allyson Sabal  Rpeat CT chest in august 2016  - as follows  - first HRCT ILD protocol supine rule out ILD  and prone and then CTangio for followup nodes   Followup  - august 2016 with me

## 2015-05-06 NOTE — Progress Notes (Signed)
Subjective:    Patient ID: Andrew Shaw, male    DOB: 1946/07/21, 69 y.o.   MRN: [id number] PCP Andrew Coffin, MD  HPI  OV 05/06/2015  Chief Complaint  Patient presents with  . Pulmonary Consult    Referred by Dr Andrew Shaw for eval of hilar lymthadenopathy. Pt states an occasional productive coigh with clear mucus, skin itching, joint pain and stiffness.     Andrew Shaw referred by Dr. Donnella Shaw of cardiology for abnormal mediastinal adenopathy  Chart review shows that in 2004 he had lower extremity DVT following knee surgery. There are reports of pulmonary embolism but he does not confirm this to me. He has been on chronic Plavix for several years due to coronary artery disease and status post stent. Approximately March 2016 he had some atypical chest pain and was subjective CT angiogram. Pulmonary malaise was ruled out but he did have some mild medius and adenopathy. Therefore he had a follow-up CT scan of the chest 04/22/2015 that showed enlargement in this medicinal adenopathy and therefore he is been referred here. Off note, it is not personal opinion that he might have some ILD changes on his CT chest but these could well be basal atelectasis with him on the supine position. I have personally visualized all these images and confirm these findings. He does not endorse any shortness of breath cough, hemoptysis, wheezing, chest pain, orthopnea, paroxysmal nocturnal dyspnea. He is a smoker but is never had pulmonary function test. Also chart review notes that he complains about intermittent swelling in his right neck area which she subsequently endorsed to me  He does have some new onset scattered red rash lesions across his whole body also new since March 2016. He sees Dr. Allyson Shaw for the same. He says some of this is cancer but is not mentioned in his past medical history.  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 No prior history of sarcoidosis,. No history of exposure to tuberculosis. No evidence of autoimmune disease.  He does not want to have biopsy till August 2016because of a silver wedding anniversary.  He is a heavy smoker but has cut down.     CLINICAL DATA: Followup hilar and mediastinal lymphadenopathy. Coronary artery disease. Prior history of pulmonary embolism.  BUN and creatinine were obtained on site at [facility] at [address]Results: BUN 6 mg/dL, Creatinine 1.1 mg/dL.  EXAM: CT ANGIOGRAPHY CHEST WITH CONTRAST  TECHNIQUE: Multidetector CT imaging of the chest was performed using the standard protocol during bolus administration of intravenous contrast. Multiplanar CT image reconstructions and MIPs were obtained to evaluate the vascular anatomy.  CONTRAST: 80 mL Isovue 370  COMPARISON: 01/14/2015  FINDINGS: Mediastinum/Lymph Nodes: Satisfactory opacification of pulmonary arteries is noted, and no pulmonary embolism identified. No evidence of thoracic aortic dissection or aneurysm.  Increased right hilar lymphadenopathy seen since previous study, currently measuring 2.7 cm on image 47/series 6 compared to 1.9 cm previously. Mild mediastinal lymphadenopathy again demonstrated, with largest lymph node in the subcarinal region measuring 2.1 cm on image 50/series 6 compared to 1.9 cm previously. No new sites of lymphadenopathy identified within the thorax.  Lungs/Pleura: No pulmonary infiltrate or mass identified. No effusion present.  Musculoskeletal/Soft Tissues: No suspicious bone lesions or other significant chest wall abnormality.  Upper Abdomen: Unremarkable.  Review of the MIP images confirms the above findings.  IMPRESSION: Mild interval increase in  right hilar and subcarinal mediastinal lymphadenopathy. Differential diagnosis includes lymphoproliferative disorder, sarcoidosis and other granulomatous or  inflammatory disease, or less likely metastatic disease. Consider bronchoscopy for further evaluation and tissue diagnosis.  No evidence of pulmonary embolism or active lung disease.   Electronically Signed  By: Andrew Shaw M.D.  On: 04/22/2015 11:09     has a past medical history of Coronary artery disease; Hypertension; Hyperlipemia; Shortness of breath; Chronic back pain; DVT (deep venous thrombosis); BPH (benign prostatic hyperplasia); Arthritis; Neuromuscular disorder; Carpal tunnel syndrome; Neuropathy; GERD (gastroesophageal reflux disease); Myocardial infarction (2005); S/P angioplasty with stent, BMS to LCX  12/23/12 (12/23/2012); and Back pain (12/23/2012).   reports that he has been smoking Cigarettes.  He has a 12.5 pack-year smoking history. He has never used smokeless tobacco.  Past Surgical History  Procedure Laterality Date  . Knee arthroscopy  04,06    left  . Joint replacement  2008    lt total knee  . Manipulation knee joint  2009    closed lt knee   . Hernia repair  2008    umb   . Cervical fusion  1999  . Back surgery  2004    lumb fusion  . Epidural block injection      multiple lumbar  . Carpal tunnel release  04/15/2012    Procedure: CARPAL TUNNEL RELEASE;  Surgeon: Andrew Sours, MD;  Location: [facility];  Service: Orthopedics;  Laterality: Left;  . Trigger finger release  04/15/2012    Procedure: RELEASE TRIGGER FINGER/A-1 PULLEY;  Surgeon: Andrew Sours, MD;  Location: [facility];  Service: Orthopedics;  Laterality: Left;  left thumb and little finger  . Cardiac catheterization  12/23/2012    Mid nondominant AV groove circumflex stented with a 2.5x53m Mini Vision stent resulting in a reduction of 90% stenosis to 0% residual  . Cardiac catheterization  02/07/2004    Noncritical CAD, continue medical therapy  . Cardiac catheterization  02/03/1999    Recommended medical therapy  . Left heart catheterization with coronary  angiogram N/A 12/23/2012    Procedure: LEFT HEART CATHETERIZATION WITH CORONARY ANGIOGRAM;  Surgeon: JLorretta Harp MD;  [facility];  Service: Cardiovascular;  Laterality: N/A;  . Percutaneous coronary stent intervention (pci-s)  12/23/2012    Procedure: PERCUTANEOUS CORONARY STENT INTERVENTION (PCI-S);  Surgeon: JLorretta Harp MD;  [facility];  Service: Cardiovascular;;  . Left heart catheterization with coronary angiogram N/A 01/11/2015    Procedure: LEFT HEART CATHETERIZATION WITH CORONARY ANGIOGRAM;  Surgeon: Peter M JMartinique MD;  [facility];  Service: Cardiovascular;  Laterality: N/A;    Allergies  Allergen Reactions  . Lisinopril Rash and Other (See Comments)    Swelling of face  . Antihistamines, Chlorpheniramine-Type Itching  . Cortisone     MI  . Flexeril [Cyclobenzaprine] Itching     There is no immunization history on file for this patient.  Family History  Problem Relation Age of Onset  . Heart disease Mother   . Hypertension Sister   . Diabetes Sister   . Cancer Brother      Current outpatient prescriptions:  .  aspirin EC 81 MG tablet, Take 81 mg by mouth daily., Disp: , Rfl:  .  clopidogrel (PLAVIX) 75 MG tablet, TAKE 1 TABLET BY MOUTH EVERY DAY, Disp: 30 tablet, Rfl: 5 .  fenofibrate (TRICOR) 48 MG tablet, Take 48 mg by mouth daily., Disp: , Rfl:  .  HYDROcodone-acetaminophen (NORCO) 10-325 MG per tablet, Take 1 tablet by mouth every 4 (four) hours as needed (breakthrough pain). , Disp: , Rfl:  .  irbesartan (AVAPRO) 150 MG tablet, Take 1 tablet (150 mg total) by mouth daily., Disp: 30 tablet, Rfl: 6 .  metoprolol (LOPRESSOR) 50 MG tablet, TAKE 1 TABLET BY MOUTH TWICE DAILY, Disp: 60 tablet, Rfl: 9 .  nitroGLYCERIN (NITROSTAT) 0.4 MG SL tablet, Place 1 tablet (0.4 mg total) under the tongue every 5 (five) minutes x 3 doses as needed for chest pain., Disp: 25 tablet, Rfl: 2 .  OXYCONTIN 20 MG T12A 12 hr tablet, Take 20 mg by mouth  every 8 (eight) hours., Disp: , Rfl: 0 .  rosuvastatin (CRESTOR) 20 MG tablet, Take 20 mg by mouth daily., Disp: , Rfl:      Review of Systems  Constitutional: Positive for unexpected weight change. Negative for fever, chills, activity change and appetite change.  HENT: Positive for postnasal drip. Negative for congestion, dental problem, rhinorrhea, sneezing, sore throat, trouble swallowing and voice change.   Eyes: Negative for visual disturbance.  Respiratory: Positive for cough. Negative for choking and shortness of breath.   Cardiovascular: Negative for chest pain and leg swelling.  Gastrointestinal: Negative for nausea, vomiting and abdominal pain.  Genitourinary: Negative for difficulty urinating.  Musculoskeletal: Positive for joint swelling. Negative for arthralgias.  Skin: Negative for rash.  Psychiatric/Behavioral: Negative for behavioral problems and confusion.       Objective:   Physical Exam  Constitutional: He is oriented to person, place, and time. He appears well-developed and well-nourished. No distress.  HENT:  Head: Normocephalic and atraumatic.  Right Ear: External ear normal.  Left Ear: External ear normal.  Mouth/Throat: Oropharynx is clear and moist. No oropharyngeal exudate.  Scar in left neck  Eyes: Conjunctivae and EOM are normal. Pupils are equal, round, and reactive to light. Right eye exhibits no discharge. Left eye exhibits no discharge. No scleral icterus.  Neck: Normal range of motion. Neck supple. No JVD present. No tracheal deviation present. No thyromegaly present.  Cardiovascular: Normal rate, regular rhythm and intact distal pulses.  Exam reveals no gallop and no friction rub.   No murmur heard. Pulmonary/Chest: Effort normal and breath sounds normal. No respiratory distress. He has no wheezes. He has no rales. He exhibits no tenderness.  Abdominal: Soft. Bowel sounds are normal. He exhibits no distension and no mass. There is no tenderness.  There is no rebound and no guarding.  Musculoskeletal: Normal range of motion. He exhibits no edema or tenderness.  Stiff back Some kyphosois mild  Lymphadenopathy:    He has no cervical adenopathy.  Neurological: He is alert and oriented to person, place, and time. He has normal reflexes. No cranial nerve deficit. Coordination normal.  Skin: Skin is warm and dry. No rash noted. He is not diaphoretic. No erythema. No pallor.  Red, dry rash scattered allover body including UE - new since march 2016, sees Dr Andrew Shaw. Says some of this is cancer  Psychiatric: He has a normal mood and affect. His behavior is normal. Judgment and thought content normal.  Nursing note and vitals reviewed.   Filed Vitals:   05/06/15 1123  BP: 120/70  Pulse: 65  Height: '5\' 6"'  (1.676 m)  Weight: 190 lb (86.183 kg)  SpO2: 98%          Assessment & Plan:     ICD-9-CM ICD-10-CM   1. Smoking history V15.82 Z72.0   2. Mediastinal  adenopathy 785.6 R59.9 CT Chest High Resolution     ANA     Rheumatoid factor     Cyclic citrul peptide antibody, IgG     Angiotensin converting enzyme     Sedimentation rate     Pulmonary Function Test  3. Abnormal CT scan, chest 793.2 R93.8 CT Chest High Resolution     ANA     Rheumatoid factor     Cyclic citrul peptide antibody, IgG     Angiotensin converting enzyme     Sedimentation rate     Pulmonary Function Test    Do full PFT breathing test anytime next few weeks Do ANA, RF, CCP, ACE, ESR levels blood test 05/06/2015  Get dermatology chart from Dr. Allyson Shaw  Rpeat CT chest in august 2016  - as follows  - first HRCT ILD protocol supine rule out ILD  and prone and then CTangio for followup nodes   Followup  - august 2016 with me

## 2015-05-07 ENCOUNTER — Telehealth: Payer: Self-pay | Admitting: Internal Medicine

## 2015-05-07 LAB — RHEUMATOID FACTOR: Rhuematoid fact SerPl-aCnc: <10 IU/mL (ref <=14)

## 2015-05-07 NOTE — Telephone Encounter (Signed)
Andrew Shaw  I would like for his derm charts from Dr Allyson Sabal  Thanks  Dr. Brand Males, M.D., [facility].C.P Pulmonary and Critical Care Medicine Staff Physician [place] [facility] Pager: [phone], If no answer or between  15:00h - 7:00h: call [phone]  05/07/2015 10:57 AM

## 2015-05-09 LAB — ANTI-NUCLEAR AB-TITER (ANA TITER)

## 2015-05-09 LAB — ANGIOTENSIN CONVERTING ENZYME: Angiotensin-Converting Enzyme: 27 U/L (ref 8–52)

## 2015-05-09 LAB — CYCLIC CITRUL PEPTIDE ANTIBODY, IGG: Cyclic Citrullin Peptide Ab: <2 U/mL (ref 0.0–5.0)

## 2015-05-09 LAB — ANA: Anti Nuclear Antibody(ANA): POSITIVE — AB

## 2015-05-09 NOTE — Telephone Encounter (Signed)
Received records and placed them in [facility]. Nothing further needed at this time.

## 2015-05-09 NOTE — Telephone Encounter (Signed)
Called Dr. Ledell Peoples office and requested records be faxed to upfront fax. Will forward to elise

## 2015-05-11 ENCOUNTER — Telehealth: Payer: Self-pay | Admitting: Internal Medicine

## 2015-05-11 NOTE — Telephone Encounter (Addendum)
Autoimmune 05/09/15 - normal ACE, ESR, CCP, RF, ANA is trace piositive and I will call hit within normal limits for age  Also, please call Dr Allyson Sabal office again - I reviewd noets but they do not tell what type of skin cancer he had - ? Melanoma  March 2016  Dr. Brand Males, M.D., F.C.C.P Pulmonary and Critical Care Medicine Staff Physician [place] [facility] Pager: [phone], If no answer or between  15:00h - 7:00h: call [phone]  05/11/2015 9:40 PM

## 2015-05-12 NOTE — Telephone Encounter (Signed)
Ok thanks. Will close message. No chagne to my plan

## 2015-05-12 NOTE — Telephone Encounter (Signed)
Called and spoke to pt. Informed pt of the results per MR. Pt verbalized understanding and denied any further questions or concerns at this time.   Called and left message at Dr. Ledell Peoples office.

## 2015-05-12 NOTE — Telephone Encounter (Signed)
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

## 2015-05-18 ENCOUNTER — Ambulatory Visit (INDEPENDENT_AMBULATORY_CARE_PROVIDER_SITE_OTHER): Payer: Medicare Other | Admitting: Internal Medicine

## 2015-05-18 DIAGNOSIS — R599 Enlarged lymph nodes, unspecified: Secondary | ICD-10-CM | POA: Diagnosis not present

## 2015-05-18 DIAGNOSIS — R9389 Abnormal findings on diagnostic imaging of other specified body structures: Secondary | ICD-10-CM

## 2015-05-18 DIAGNOSIS — R590 Localized enlarged lymph nodes: Secondary | ICD-10-CM

## 2015-05-18 LAB — PULMONARY FUNCTION TEST
DL/VA % PRED: 72 %
DL/VA: 3.14 ml/min/mmHg/L
DLCO UNC: 20.33 ml/min/mmHg
DLCO unc % pred: 75 %
FEF 25-75 Post: 2.43 L/sec
FEF 25-75 Pre: 1.86 L/sec
FEF2575-%Change-Post: 30 %
FEF2575-%Pred-Post: 112 %
FEF2575-%Pred-Pre: 86 %
FEV1-%CHANGE-POST: 6 %
FEV1-%Pred-Post: 107 %
FEV1-%Pred-Pre: 101 %
FEV1-Post: 3.01 L
FEV1-Pre: 2.83 L
FEV1FVC-%CHANGE-POST: 0 %
FEV1FVC-%Pred-Pre: 95 %
FEV6-%Change-Post: 5 %
FEV6-%Pred-Post: 115 %
FEV6-%Pred-Pre: 109 %
FEV6-Post: 4.15 L
FEV6-Pre: 3.93 L
FEV6FVC-%Change-Post: 0 %
FEV6FVC-%PRED-POST: 104 %
FEV6FVC-%PRED-PRE: 104 %
FVC-%Change-Post: 5 %
FVC-%PRED-POST: 111 %
FVC-%PRED-PRE: 105 %
FVC-POST: 4.25 L
FVC-Pre: 4.01 L
POST FEV1/FVC RATIO: 71 %
POST FEV6/FVC RATIO: 98 %
PRE FEV6/FVC RATIO: 98 %
Pre FEV1/FVC ratio: 71 %
RV % PRED: 110 %
RV: 2.44 L
TLC % PRED: 106 %
TLC: 6.64 L

## 2015-05-18 NOTE — Progress Notes (Signed)
PFT done today. 

## 2015-06-01 DIAGNOSIS — G894 Chronic pain syndrome: Secondary | ICD-10-CM | POA: Diagnosis not present

## 2015-06-01 DIAGNOSIS — M47812 Spondylosis without myelopathy or radiculopathy, cervical region: Secondary | ICD-10-CM | POA: Diagnosis not present

## 2015-06-01 DIAGNOSIS — M4726 Other spondylosis with radiculopathy, lumbar region: Secondary | ICD-10-CM | POA: Diagnosis not present

## 2015-06-01 DIAGNOSIS — Z79891 Long term (current) use of opiate analgesic: Secondary | ICD-10-CM | POA: Diagnosis not present

## 2015-06-07 ENCOUNTER — Other Ambulatory Visit: Payer: Self-pay | Admitting: Cardiovascular Disease

## 2015-06-21 ENCOUNTER — Ambulatory Visit (INDEPENDENT_AMBULATORY_CARE_PROVIDER_SITE_OTHER)
Admission: RE | Admit: 2015-06-21 | Discharge: 2015-06-21 | Disposition: A | Discharge status: Home / Self Care | Payer: Medicare Other | Source: Ambulatory Visit | Attending: Internal Medicine | Admitting: Internal Medicine

## 2015-06-21 DIAGNOSIS — R599 Enlarged lymph nodes, unspecified: Secondary | ICD-10-CM

## 2015-06-21 DIAGNOSIS — R938 Abnormal findings on diagnostic imaging of other specified body structures: Secondary | ICD-10-CM

## 2015-06-21 DIAGNOSIS — J9809 Other diseases of bronchus, not elsewhere classified: Secondary | ICD-10-CM | POA: Diagnosis not present

## 2015-06-21 DIAGNOSIS — R590 Localized enlarged lymph nodes: Secondary | ICD-10-CM

## 2015-06-21 DIAGNOSIS — J432 Centrilobular emphysema: Secondary | ICD-10-CM | POA: Diagnosis not present

## 2015-06-21 DIAGNOSIS — R9389 Abnormal findings on diagnostic imaging of other specified body structures: Secondary | ICD-10-CM

## 2015-06-21 DIAGNOSIS — R591 Generalized enlarged lymph nodes: Secondary | ICD-10-CM | POA: Diagnosis not present

## 2015-06-28 ENCOUNTER — Telehealth: Payer: Self-pay | Admitting: Internal Medicine

## 2015-06-28 NOTE — Telephone Encounter (Signed)
Called and spoke to pt. Informed pt of the recs per MR. Pt verbalized understanding and denied any further questions or concerns at this time.  

## 2015-06-28 NOTE — Telephone Encounter (Signed)
IMPRESSION: 1. The appearance of the lungs suggests a smoking related disease such as respiratory bronchiolitis (RB), or if the patient is symptomatic, respiratory bronchiolitis interstitial lung disease (RB-LD). 2. Mild diffuse bronchial wall thickening with mild centrilobular and paraseptal emphysema, and mild air trapping; imaging findings suggestive of underlying COPD. 3. Persistent right hilar and subcarinal lymphadenopathy. Although this is relatively stable compared to the recent prior study from 04/22/2015, this does appear increased compared to the more remote prior examination 01/14/2015. Malignancy is not excluded. Correlation with bronchoscopy for biopsy is suggested to establish a tissue diagnosis, if clinically appropriate. While the possibility of a benign process such as, the asymmetry is concerning. Sarcoidosis is considered 4. Atherosclerosis, including three vessel coronary artery disease. Please note that although the presence of coronary artery calcium documents the presence of coronary artery disease, the severity of this disease and any potential stenosis cannot be assessed on this non-gated CT examination. Assessment for potential risk factor modification, dietary therapy or pharmacologic therapy may be warranted, if clinically indicated.   Electronically Signed By: Vinnie Langton M.D. On: 06/21/2015 16:00               Elise  severeal findings on CT chest - will discuss on 07/11/15 visit. Let him know  Dr. Brand Males, M.D., [facility].C.P Pulmonary and Critical Care Medicine Staff Physician [place] [facility] Pager: [phone], If no answer or between  15:00h - 7:00h: call [phone]  06/28/2015 1:29 PM

## 2015-06-29 DIAGNOSIS — M4726 Other spondylosis with radiculopathy, lumbar region: Secondary | ICD-10-CM | POA: Diagnosis not present

## 2015-06-29 DIAGNOSIS — M47812 Spondylosis without myelopathy or radiculopathy, cervical region: Secondary | ICD-10-CM | POA: Diagnosis not present

## 2015-06-29 DIAGNOSIS — Z79891 Long term (current) use of opiate analgesic: Secondary | ICD-10-CM | POA: Diagnosis not present

## 2015-06-29 DIAGNOSIS — G894 Chronic pain syndrome: Secondary | ICD-10-CM | POA: Diagnosis not present

## 2015-07-06 DIAGNOSIS — Z85828 Personal history of other malignant neoplasm of skin: Secondary | ICD-10-CM | POA: Diagnosis not present

## 2015-07-06 DIAGNOSIS — D485 Neoplasm of uncertain behavior of skin: Secondary | ICD-10-CM | POA: Diagnosis not present

## 2015-07-06 DIAGNOSIS — Z08 Encounter for follow-up examination after completed treatment for malignant neoplasm: Secondary | ICD-10-CM | POA: Diagnosis not present

## 2015-07-07 DIAGNOSIS — D485 Neoplasm of uncertain behavior of skin: Secondary | ICD-10-CM | POA: Diagnosis not present

## 2015-07-11 ENCOUNTER — Ambulatory Visit (INDEPENDENT_AMBULATORY_CARE_PROVIDER_SITE_OTHER): Payer: Medicare Other | Admitting: Internal Medicine

## 2015-07-11 ENCOUNTER — Encounter: Payer: Self-pay | Admitting: Internal Medicine

## 2015-07-11 VITALS — BP 140/72 | HR 57 | Ht 66.0 in | Wt 191.0 lb

## 2015-07-11 DIAGNOSIS — J849 Interstitial pulmonary disease, unspecified: Secondary | ICD-10-CM | POA: Insufficient documentation

## 2015-07-11 DIAGNOSIS — J3489 Other specified disorders of nose and nasal sinuses: Secondary | ICD-10-CM

## 2015-07-11 DIAGNOSIS — Z720 Tobacco use: Secondary | ICD-10-CM | POA: Diagnosis not present

## 2015-07-11 DIAGNOSIS — R599 Enlarged lymph nodes, unspecified: Secondary | ICD-10-CM

## 2015-07-11 DIAGNOSIS — J438 Other emphysema: Secondary | ICD-10-CM

## 2015-07-11 DIAGNOSIS — Z87891 Personal history of nicotine dependence: Secondary | ICD-10-CM

## 2015-07-11 DIAGNOSIS — R590 Localized enlarged lymph nodes: Secondary | ICD-10-CM

## 2015-07-11 MED ORDER — TIOTROPIUM BROMIDE MONOHYDRATE 18 MCG IN CAPS: 18.0000 ug | ORAL_CAPSULE | Freq: Every day | RESPIRATORY_TRACT | Status: DC

## 2015-07-11 MED ORDER — FLUTICASONE PROPIONATE 50 MCG/ACT NA SUSP: 2.0000 | Freq: Every day | NASAL | Status: DC

## 2015-07-11 NOTE — Progress Notes (Signed)
Subjective:    Patient ID: Andrew Shaw, male    DOB: 25-Aug-1946, 69 y.o.   MRN: [id number]  HPI   PCP [name], MD  HPI  OV 05/06/2015  Chief Complaint  Patient presents with  . Pulmonary Consult    Referred by Dr [name] for eval of hilar lymthadenopathy. Pt states an occasional productive coigh with clear mucus, skin itching, joint pain and stiffness.     [name] referred by Dr. Donnella Bi of cardiology for abnormal mediastinal adenopathy  Chart review shows that in 2004 he had lower extremity DVT following knee surgery. There are reports of pulmonary embolism but he does not confirm this to me. He has been on chronic Plavix for several years due to coronary artery disease and status post stent. Approximately March 2016 he had some atypical chest pain and was subjective CT angiogram. PE was ruled out but he did have some mild med.adenopathy. Therefore he had a follow-up CT scan of the chest 04/22/2015 that showed enlargement in this medicinal adenopathy and therefore he is been referred here. Off note, it is not personal opinion that he might have some ILD changes on his CT chest but these could well be basal atelectasis with him on the supine position. I have personally visualized all these images and confirm these findings. He does not endorse any shortness of breath cough, hemoptysis, wheezing, chest pain, orthopnea, paroxysmal nocturnal dyspnea. He is a smoker but is never had pulmonary function test. Also chart review notes that he complains about intermittent swelling in his right neck area which she subsequently endorsed to me  He does have some new onset scattered red rash lesions across his whole body also new since March 2016. He sees Dr. Allyson Sabal for the same. He says some of this is cancer but is not mentioned in his past medical history.  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 No  prior history of sarcoidosis,. No history of exposure to tuberculosis. No evidence of autoimmune disease.  He does not want to have biopsy till August 2016because of a silver wedding anniversary.  He is a heavy smoker but has cut down.     CLINICAL DATA: Followup hilar and mediastinal lymphadenopathy. Coronary artery disease. Prior history of pulmonary embolism.  BUN and creatinine were obtained on site at [facility] at [address]Results: BUN 6 mg/dL, Creatinine 1.1 mg/dL.  EXAM: CT ANGIOGRAPHY CHEST WITH CONTRAST  TECHNIQUE: Multidetector CT imaging of the chest was performed using the standard protocol during bolus administration of intravenous contrast. Multiplanar CT image reconstructions and MIPs were obtained to evaluate the vascular anatomy.  CONTRAST: 80 mL Isovue 370  COMPARISON: 01/14/2015  FINDINGS: Mediastinum/Lymph Nodes: Satisfactory opacification of pulmonary arteries is noted, and no pulmonary embolism identified. No evidence of thoracic aortic dissection or aneurysm.  Increased right hilar lymphadenopathy seen since previous study, currently measuring 2.7 cm on image 47/series 6 compared to 1.9 cm previously. Mild mediastinal lymphadenopathy again demonstrated, with largest lymph node in the subcarinal region measuring 2.1 cm on image 50/series 6 compared to 1.9 cm previously. No new sites of lymphadenopathy identified within the thorax.  Lungs/Pleura: No pulmonary infiltrate or mass identified. No effusion present.  Musculoskeletal/Soft Tissues: No suspicious bone lesions or other significant chest wall abnormality.  Upper Abdomen: Unremarkable.  Review of the MIP images confirms the above findings.  IMPRESSION: Mild interval increase  in right hilar and subcarinal mediastinal lymphadenopathy. Differential diagnosis includes lymphoproliferative disorder, sarcoidosis and other granulomatous or  inflammatory disease, or less likely metastatic disease. Consider bronchoscopy for further evaluation and tissue diagnosis.  No evidence of pulmonary embolism or active lung disease.   Electronically Signed  By: Earle Gell M.D.  On: 04/22/2015 11:09     OV 07/11/2015  Chief Complaint  Patient presents with  . Follow-up    Pt states his breathing is unchanged since last OV. Pt c/o dyspnea with moderate exeriton. Pt denies cough and CP/tightness.    Presents with wife to discus results  IMPRESSION: 1. The appearance of the lungs suggests a smoking related disease such as respiratory bronchiolitis (RB), or if the patient is symptomatic, respiratory bronchiolitis interstitial lung disease (RB-LD). 2. Mild diffuse bronchial wall thickening with mild centrilobular and paraseptal emphysema, and mild air trapping; imaging findings suggestive of underlying COPD. 3. Persistent right hilar and subcarinal lymphadenopathy. Although this is relatively stable compared to the recent prior study from 04/22/2015, this does appear increased compared to the more remote prior examination 01/14/2015. Malignancy is not excluded. Correlation with bronchoscopy for biopsy is suggested to establish a tissue diagnosis, if clinically appropriate. While the possibility of a benign process such as, the asymmetry is concerning. Sarcoidosis is considered 4. Atherosclerosis, including three vessel coronary artery disease. Please note that although the presence of coronary artery calcium documents the presence of coronary artery disease, the severity of this disease and any potential stenosis cannot be assessed on this non-gated CT examination. Assessment for potential risk factor modification, dietary therapy or pharmacologic therapy may be warranted, if clinically indicated.   Electronically Signed By: Vinnie Langton M.D. On: 06/21/2015 16:00                Thyroid function  test 05/18/2015 spirometry is normal total lung capacity is normal but diffusion capacity is mildly reduced at 20.33/75%   Autoimmune 05/09/15 - normal ACE, ESR, CCP, RF, ANA is trace piositive and I will call hit within normal limits for age  dedria calling back san says thast pt had squamous skin cancer she can be reached @ [phone].Stanley A Dalton  Review of Systems  Constitutional: Negative for fever and unexpected weight change.  HENT: Positive for postnasal drip. Negative for congestion, dental problem, ear pain, nosebleeds, rhinorrhea, sinus pressure, sneezing, sore throat and trouble swallowing.   Eyes: Negative for redness and itching.  Respiratory: Positive for shortness of breath. Negative for cough, chest tightness and wheezing.   Cardiovascular: Negative for palpitations and leg swelling.  Gastrointestinal: Negative for nausea and vomiting.  Genitourinary: Negative for dysuria.  Musculoskeletal: Negative for joint swelling.  Skin: Positive for rash.  Neurological: Negative for headaches.  Hematological: Does not bruise/bleed easily.  Psychiatric/Behavioral: Negative for dysphoric mood. The patient is not nervous/anxious.        Objective:   Physical Exam  Filed Vitals:   07/11/15 1609  BP: 140/72  Pulse: 57  Height: _0  (1.676 m)  Weight: 191 lb (86.637 kg)  SpO2: 98%   Discussion only visit       Assessment & Plan:     ICD-9-CM ICD-10-CM   1. Mediastinal adenopathy 785.6 R59.9 Ambulatory referral to Cardiothoracic Surgery     CT Chest High Resolution  2. Smoking history V15.82 Z72.0   3. Other emphysema 492.8 J43.8   4. ILD (interstitial lung disease) 515 J84.9 CT Chest High Resolution     CANCELED: CT Chest High Resolution  5. Sinus drainage 478.19 J34.89     #Mediastinal adenopathy  - Refer to Dr. Roxan Hockey or Dr. Pia Mau for evaluation by endobronchial ultrasound with conversion to mediastinoscopy in the same setting for  biopsy  #Emphysema - Start Spiriva 1 puff once daily  #Postnasal drainage .  take generic fluticasone inhaler 2 squirts each nostril daily  # Interstitial lung disease probably associated with smoking  - Quit smoking for total of  5 months  - Repeat high-resolution CT scan of the chest without contrast in 5 months  - If infiltrate still persists then we will do referral for surgical lung biopsy   #Follow-up - 5 months with high-resolution CT scan of the chest   > 50% of this > 25 min visit spent in face to face counseling or coordination of care    Dr. Brand Males, M.D., [facility].C.P Pulmonary and Critical Care Medicine Staff Physician [facility] Pager: [phone], If no answer or between  15:00h - 7:00h: call [phone]  07/24/2015 12:17 AM

## 2015-07-11 NOTE — Patient Instructions (Addendum)
ICD-9-CM ICD-10-CM   1. Mediastinal adenopathy 785.6 R59.9   2. Smoking history V15.82 Z72.0   3. Other emphysema 492.8 J43.8   4. ILD (interstitial lung disease) 515 J84.9    #Mediastinal adenopathy  - Refer to Dr. Roxan Hockey or Dr. Pia Mau for evaluation by endobronchial ultrasound with conversion to mediastinoscopy in the same setting for biopsy  #Emphysema - Start Spiriva 1 puff once daily  #Postnasal drainage .  take generic fluticasone inhaler 2 squirts each nostril daily  # Interstitial lung disease probably associated with smoking  - Quit smoking for total of  5 months  - Repeat high-resolution CT scan of the chest without contrast in 5 months  - If infiltrate still persists then we will do referral for surgical lung biopsy   #Follow-up - 5 months with high-resolution CT scan of the chest

## 2015-07-14 DIAGNOSIS — Z125 Encounter for screening for malignant neoplasm of prostate: Secondary | ICD-10-CM | POA: Diagnosis not present

## 2015-07-14 DIAGNOSIS — I2510 Atherosclerotic heart disease of native coronary artery without angina pectoris: Secondary | ICD-10-CM | POA: Diagnosis not present

## 2015-07-14 DIAGNOSIS — L309 Dermatitis, unspecified: Secondary | ICD-10-CM | POA: Diagnosis not present

## 2015-07-14 DIAGNOSIS — E782 Mixed hyperlipidemia: Secondary | ICD-10-CM | POA: Diagnosis not present

## 2015-07-14 DIAGNOSIS — Z23 Encounter for immunization: Secondary | ICD-10-CM | POA: Diagnosis not present

## 2015-07-14 DIAGNOSIS — Z0000 Encounter for general adult medical examination without abnormal findings: Secondary | ICD-10-CM | POA: Diagnosis not present

## 2015-07-14 DIAGNOSIS — R590 Localized enlarged lymph nodes: Secondary | ICD-10-CM | POA: Diagnosis not present

## 2015-07-14 DIAGNOSIS — I10 Essential (primary) hypertension: Secondary | ICD-10-CM | POA: Diagnosis not present

## 2015-07-19 ENCOUNTER — Institutional Professional Consult (permissible substitution) (INDEPENDENT_AMBULATORY_CARE_PROVIDER_SITE_OTHER): Payer: Medicare Other | Admitting: Thoracic Surgery (Cardiothoracic Vascular Surgery)

## 2015-07-19 ENCOUNTER — Encounter: Payer: Self-pay | Admitting: Thoracic Surgery (Cardiothoracic Vascular Surgery)

## 2015-07-19 ENCOUNTER — Other Ambulatory Visit: Payer: Self-pay | Admitting: *Deleted

## 2015-07-19 VITALS — BP 156/80 | HR 67 | Resp 20 | Ht 66.0 in | Wt 189.0 lb

## 2015-07-19 DIAGNOSIS — R590 Localized enlarged lymph nodes: Secondary | ICD-10-CM

## 2015-07-19 DIAGNOSIS — R599 Enlarged lymph nodes, unspecified: Secondary | ICD-10-CM | POA: Diagnosis not present

## 2015-07-19 NOTE — Patient Instructions (Signed)
Stop Plavix (clopidogrel) after your dose on Saturday 07/23/15

## 2015-07-19 NOTE — Progress Notes (Addendum)
PCP is [name], MD Referring Provider is Alroy Dust, L.Marlou Sa, MD  Chief Complaint  Patient presents with  . Adenopathy    Surgical eval, right hilar and subcarinal lymphadenopathy, Chest CT8/9/16    HPI: Andrew Shaw is a 69 yo man who is sent for consultation regarding mediastinal and hilar adenopathy.  He has a past medical history significant for hypertension, hyperlipidemia, coronary artery disease with a previous stent, DVT, possible PE, back and neck surgery, and skin cancer. He presented in February of this year with chest pain. He ruled out for an MI. As part of his workup he had a CT angiogram of the chest to rule out pulmonary embolus. It showed mediastinal and hilar adenopathy. A repeat CT was done in June which showed the adenopathy was persistent. He saw Dr. Chase Caller who recommended biopsy, but the patient wanted to wait until after his anniversary in late August. A repeat CT in August showed persistent hilar and mediastinal adenopathy.  He complains of shortness of breath with exertion of moderate intensity. He gets tired when he works in his yard. He does not have any significant cough or wheezing. He's lost about 9 pounds over the past 6 months and 2 pounds over the past 3 months. He hasn't had any significant chest pain since his admission in February. He denies any change in his vision. He has multiple skin lesions over most of his body. These cause itching. He says that the biopsy showed cancer and "may be something else."  Zubrod Score: At the time of surgery this patient's most appropriate activity status/level should be described as: '[]'$     0    Normal activity, no symptoms '[x]'$     1    Restricted in physical strenuous activity but ambulatory, able to do out light work '[]'$     2    Ambulatory and capable of self care, unable to do work activities, up and about >50 % of waking hours                              '[]'$     3    Only limited self care, in bed greater than 50% of  waking hours '[]'$     4    Completely disabled, no self care, confined to bed or chair '[]'$     5    Moribund    Past Medical History  Diagnosis Date  . Coronary artery disease     2D ECHO, 10/31/2010 - EF >55%, normal; NUCLEAR STRESS TEST, 10/23/2010 - perfusion defect in inferior myocardial region, post-stress EF 62%, EKG negative for ischemia  . Hypertension   . Hyperlipemia   . Shortness of breath   . Chronic back pain   . DVT (deep venous thrombosis)     history 2004 after knee surg  . BPH (benign prostatic hyperplasia)   . Arthritis   . Neuromuscular disorder   . Carpal tunnel syndrome   . 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 GERD (gastroesophageal reflux disease)   . 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 S/P angioplasty with stent, BMS to LCX  12/23/12 12/23/2012  . Back pain 12/23/2012    Past Surgical History  Procedure Laterality Date  . Knee arthroscopy  04,06    left  . Joint replacement  2008    lt total knee  . Manipulation knee joint  2009  closed lt knee   . Hernia repair  2008    umb   . Cervical fusion  1999  . Back surgery  2004    lumb fusion  . Epidural block injection      multiple lumbar  . Carpal tunnel release  04/15/2012    Procedure: CARPAL TUNNEL RELEASE;  Surgeon: Wynonia Sours, MD;  Location: [facility];  Service: Orthopedics;  Laterality: Left;  . Trigger finger release  04/15/2012    Procedure: RELEASE TRIGGER FINGER/A-1 PULLEY;  Surgeon: Wynonia Sours, MD;  Location: [place];  Service: Orthopedics;  Laterality: Left;  left thumb and little finger  . Cardiac catheterization  12/23/2012    Mid nondominant AV groove circumflex stented with a 2.5x35m Mini Vision stent resulting in a reduction of 90% stenosis to 0% residual  . Cardiac catheterization  02/07/2004    Noncritical CAD, continue medical therapy  . Cardiac catheterization  02/03/1999    Recommended medical therapy  . Left heart catheterization  with coronary angiogram N/A 12/23/2012    Procedure: LEFT HEART CATHETERIZATION WITH CORONARY ANGIOGRAM;  Surgeon: JLorretta Harp MD;  [facility];  Service: Cardiovascular;  Laterality: N/A;  . Percutaneous coronary stent intervention (pci-s)  12/23/2012    Procedure: PERCUTANEOUS CORONARY STENT INTERVENTION (PCI-S);  Surgeon: JLorretta Harp MD;  [facility];  Service: Cardiovascular;;  . Left heart catheterization with coronary angiogram N/A 01/11/2015    Procedure: LEFT HEART CATHETERIZATION WITH CORONARY ANGIOGRAM;  Surgeon: Peter M JMartinique MD;  [facility];  Service: Cardiovascular;  Laterality: N/A;    Family History  Problem Relation Age of Onset  . Heart disease Mother   . Hypertension Sister   . Diabetes Sister   . Cancer Brother     Social History Social History  Substance Use Topics  . Smoking status: Current Some Day Smoker -- 0.25 packs/day for 50 years    Types: Cigarettes  . Smokeless tobacco: Never Used  . Alcohol Use: No    Current Outpatient Prescriptions  Medication Sig Dispense Refill  . aspirin EC 81 MG tablet Take 81 mg by mouth daily.    . clopidogrel (PLAVIX) 75 MG tablet TAKE 1 TABLET BY MOUTH EVERY DAY 30 tablet 4  . fenofibrate (TRICOR) 48 MG tablet Take 48 mg by mouth daily.    . fluticasone (FLONASE) 50 MCG/ACT nasal spray Place 2 sprays into both nostrils daily. 16 g 2  . HYDROcodone-acetaminophen (NORCO) 10-325 MG per tablet Take 1 tablet by mouth every 4 (four) hours as needed (breakthrough pain).     . irbesartan (AVAPRO) 150 MG tablet Take 1 tablet (150 mg total) by mouth daily. 30 tablet 6  . metoprolol (LOPRESSOR) 50 MG tablet TAKE 1 TABLET BY MOUTH TWICE DAILY 60 tablet 9  . mometasone (ELOCON) 0.1 % cream Apply topically daily.   3  . nitroGLYCERIN (NITROSTAT) 0.4 MG SL tablet Place 1 tablet (0.4 mg total) under the tongue every 5 (five) minutes x 3 doses as needed for chest pain. 25 tablet 2  . OXYCONTIN 20 MG  T12A 12 hr tablet Take 20 mg by mouth every 8 (eight) hours.  0  . rosuvastatin (CRESTOR) 20 MG tablet Take 20 mg by mouth daily.    .Marland Kitchentiotropium (SPIRIVA) 18 MCG inhalation capsule Place 1 capsule (18 mcg total) into inhaler and inhale daily. 30 capsule 6   No current facility-administered medications for this visit.  Allergies  Allergen Reactions  . Lisinopril Rash and Other (See Comments)    Swelling of face  . Antihistamines, Chlorpheniramine-Type Itching  . Cortisone     MI  . Flexeril [Cyclobenzaprine] Itching    Review of Systems  Constitutional: Positive for unexpected weight change (lost 9 pounds in 6 months, 2 in last 3 months). Negative for fever, chills, activity change and appetite change.  HENT: Positive for dental problem. Negative for congestion and hearing loss.   Eyes: Negative for photophobia and visual disturbance.  Respiratory: Positive for shortness of breath. Negative for cough and wheezing.   Cardiovascular: Negative for chest pain and leg swelling.       Orthopnea  Endocrine: Negative for cold intolerance and heat intolerance.  Genitourinary: Negative for dysuria, urgency and frequency.  Musculoskeletal: Positive for back pain, gait problem, neck pain (2 previous surgeries) and neck stiffness.  Skin: Positive for color change, rash and wound.       Skin cancer  Neurological: Positive for numbness. Negative for headaches.  All other systems reviewed and are negative.   BP 156/80 mmHg  Pulse 67  Resp 20  Ht '5\' 6"'$  (1.676 m)  Wt 189 lb (85.73 kg)  BMI 30.52 kg/m2  SpO2 98% Physical Exam  Constitutional: He is oriented to person, place, and time. He appears well-developed and well-nourished. No distress.  HENT:  Head: Normocephalic and atraumatic.  Mouth/Throat: No oropharyngeal exudate.  Eyes: Conjunctivae and EOM are normal. No scleral icterus.  Neck: No tracheal deviation present. No thyromegaly present.  Limited ext of neck. Well healed scar   Cardiovascular: Normal rate, regular rhythm, normal heart sounds and intact distal pulses.  Exam reveals no gallop.   No murmur heard. Pulmonary/Chest: Effort normal. No respiratory distress. He has no wheezes. He has rales (faint).  Abdominal: Soft. Bowel sounds are normal. He exhibits no distension. There is no tenderness.  Musculoskeletal: He exhibits no edema.  Limited extension of neck  Lymphadenopathy:    He has no cervical adenopathy.  Neurological: He is alert and oriented to person, place, and time. He exhibits normal muscle tone. Coordination normal.  No focal motor deficit  Skin: Skin is warm and dry. Rash noted.  Multiple skin nodules, erythematous with scaling  Psychiatric: He has a normal mood and affect.  Vitals reviewed.    Diagnostic Tests: CT CHEST WITHOUT CONTRAST  TECHNIQUE: Multidetector CT imaging of the chest was performed following the standard protocol without intravenous contrast. High resolution imaging of the lungs, as well as inspiratory and expiratory imaging, was performed.  COMPARISON: Chest CT 04/22/2015.  FINDINGS: Mediastinum/Lymph Nodes: Heart size is normal. There is no significant pericardial fluid, thickening or pericardial calcification. There is atherosclerosis of the thoracic aorta, the great vessels of the mediastinum and the coronary arteries, including calcified atherosclerotic plaque in the left anterior descending, left circumflex and right coronary arteries. Again noted are enlarged subcarinal lymph nodes (2.4 cm in short axis) and enlarged right hilar lymph nodes (2.4 cm in short axis), similar to recent prior examination. Several other prominent but nonenlarged mediastinal lymph nodes are also noted. No definite left hilar adenopathy on today's noncontrast CT examination. Esophagus is unremarkable in appearance. No internal mammary or axillary lymphadenopathy.  Lungs/Pleura: Mild diffuse bronchial wall thickening with  very mild centrilobular and paraseptal emphysema, noted only in the lung apices. High-resolution imaging demonstrates a pattern of diffuse mild centrilobular ground-glass attenuation micronodularity, suggesting a smoking related disease such as respiratory bronchiolitis or respiratory bronchiolitis  interstitial lung disease. No other larger more suspicious appearing pulmonary nodules or masses are noted. Minimal subpleural reticulation is noted in the dependent portions of the lungs, which is highly nonspecific. No other larger areas of ground-glass attenuation, more extensive regions of subpleural reticulation, parenchymal banding, traction bronchiectasis or frank honeycombing is identified. Inspiratory and expiratory imaging demonstrates some mild air trapping, indicative of small airways disease.  Upper Abdomen: Extensive atherosclerosis.  Musculoskeletal/Soft Tissues: There are no aggressive appearing lytic or blastic lesions noted in the visualized portions of the skeleton.  IMPRESSION: 1. The appearance of the lungs suggests a smoking related disease such as respiratory bronchiolitis (RB), or if the patient is symptomatic, respiratory bronchiolitis interstitial lung disease (RB-LD). 2. Mild diffuse bronchial wall thickening with mild centrilobular and paraseptal emphysema, and mild air trapping; imaging findings suggestive of underlying COPD. 3. Persistent right hilar and subcarinal lymphadenopathy. Although this is relatively stable compared to the recent prior study from 04/22/2015, this does appear increased compared to the more remote prior examination 01/14/2015. Malignancy is not excluded. Correlation with bronchoscopy for biopsy is suggested to establish a tissue diagnosis, if clinically appropriate. While the possibility of a benign process such as, the asymmetry is concerning. Sarcoidosis is considered 4. Atherosclerosis, including three vessel coronary artery  disease. Please note that although the presence of coronary artery calcium documents the presence of coronary artery disease, the severity of this disease and any potential stenosis cannot be assessed on this non-gated CT examination. Assessment for potential risk factor modification, dietary therapy or pharmacologic therapy may be warranted, if clinically indicated.   Electronically Signed  By: Vinnie Langton M.D.  On: 06/21/2015 16:00        Impression: 69 year old man with mediastinal and hilar adenopathy and possibly some mild interstitial lung disease. His mediastinal adenopathy has persisted for 6 months. He needs a biopsy to determine the underlying cause. We discussed the differential diagnosis and the rationale for biopsy.  I recommended that we proceed with endobronchial ultrasound and possible mediastinal endoscopy for diagnostic purposes. I described each of the procedures individually. They would be done under general anesthesia. We discussed the indications, risks, benefits, and alternatives. He understands risks and endobronchial ultrasound include nondiagnostic study, bleeding, and pneumothorax. He understands there are risks associated with general anesthesia. He understands the risks of mediastinoscopy include, but are not limited to bleeding, recurrent nerve injury with hoarseness, stroke, esophageal injury, pneumothorax, as well as the possibility of other unforeseeable complications. I think it's about 50-50 that we will be able to get a diagnosis with ultrasound, so I think is appropriate to plan to proceed with mediastinoscopy unless a definitive diagnosis is available on the quick prep in the OR. Mediastinal endoscopy will likely be difficult due to the patient's previous neck surgery and limited extension.  I reviewed the general nature of the procedure. He understands this will be done on an outpatient basis. I reviewed the intraoperative decision making.  He  was upset when I mentioned that we would do the procedure at [facility]. He had a bad experience at her previously after his catheterization. Unfortunately, I do not have another alternative to offer him.  He will need to be off his Plavix for 5 days prior to surgery. I will check with Dr. Gwenlyn Found to make sure that is acceptable.  Plan:  Endobronchial ultrasound and possible mediastinal endoscopy in the OR on Thursday, 07/28/2015.  Hold Plavix after dose on Saturday, 07/23/2015  I spent 45 minutes with Mr. Partch,  greater than 50% was in counseling  [name], MD [facility] [phone]  I personally reviewed the CT scans and concur with the findings as noted above  Remo Lipps C. Roxan Hockey, MD [facility] [phone]

## 2015-07-26 ENCOUNTER — Ambulatory Visit (HOSPITAL_COMMUNITY)
Admission: RE | Admit: 2015-07-26 | Discharge: 2015-07-26 | Disposition: A | Discharge status: Home / Self Care | Payer: Medicare Other | Source: Ambulatory Visit | Attending: Thoracic Surgery (Cardiothoracic Vascular Surgery) | Admitting: Thoracic Surgery (Cardiothoracic Vascular Surgery)

## 2015-07-26 ENCOUNTER — Encounter (HOSPITAL_COMMUNITY): Payer: Self-pay

## 2015-07-26 ENCOUNTER — Encounter (HOSPITAL_COMMUNITY)
Admission: RE | Admit: 2015-07-26 | Discharge: 2015-07-26 | Disposition: A | Discharge status: Home / Self Care | Payer: Medicare Other | Source: Ambulatory Visit | Attending: Thoracic Surgery (Cardiothoracic Vascular Surgery) | Admitting: Thoracic Surgery (Cardiothoracic Vascular Surgery)

## 2015-07-26 VITALS — BP 129/67 | HR 60 | Temp 98.5°F | Resp 20 | Ht 65.0 in | Wt 191.6 lb

## 2015-07-26 DIAGNOSIS — Z01818 Encounter for other preprocedural examination: Secondary | ICD-10-CM | POA: Insufficient documentation

## 2015-07-26 DIAGNOSIS — J985 Diseases of mediastinum, not elsewhere classified: Secondary | ICD-10-CM | POA: Diagnosis not present

## 2015-07-26 DIAGNOSIS — R590 Localized enlarged lymph nodes: Secondary | ICD-10-CM | POA: Insufficient documentation

## 2015-07-26 DIAGNOSIS — R599 Enlarged lymph nodes, unspecified: Secondary | ICD-10-CM | POA: Diagnosis present

## 2015-07-26 HISTORY — DX: Chronic obstructive pulmonary disease, unspecified: J44.9

## 2015-07-26 HISTORY — DX: Carpal tunnel syndrome, right upper limb: G56.01

## 2015-07-26 LAB — CBC
HEMATOCRIT: 42.9 % (ref 39.0–52.0)
HEMOGLOBIN: 14.9 g/dL (ref 13.0–17.0)
MCH: 30.7 pg (ref 26.0–34.0)
MCHC: 34.7 g/dL (ref 30.0–36.0)
MCV: 88.3 fL (ref 78.0–100.0)
Platelets: 183 10*3/uL (ref 150–400)
RBC: 4.86 MIL/uL (ref 4.22–5.81)
RDW: 13.7 % (ref 11.5–15.5)
WBC: 4.5 10*3/uL (ref 4.0–10.5)

## 2015-07-26 LAB — ABO/RH: ABO/RH(D): A POS

## 2015-07-26 LAB — COMPREHENSIVE METABOLIC PANEL
ALK PHOS: 68 U/L (ref 38–126)
ALT: 19 U/L (ref 17–63)
ANION GAP: 7 (ref 5–15)
AST: 23 U/L (ref 15–41)
Albumin: 3.8 g/dL (ref 3.5–5.0)
BILIRUBIN TOTAL: 0.7 mg/dL (ref 0.3–1.2)
BUN: 7 mg/dL (ref 6–20)
CALCIUM: 9 mg/dL (ref 8.9–10.3)
CO2: 23 mmol/L (ref 22–32)
Chloride: 96 mmol/L — ABNORMAL LOW (ref 101–111)
Creatinine, Ser: 1.09 mg/dL (ref 0.61–1.24)
Glucose, Bld: 90 mg/dL (ref 65–99)
Potassium: 4.5 mmol/L (ref 3.5–5.1)
Sodium: 126 mmol/L — ABNORMAL LOW (ref 135–145)
TOTAL PROTEIN: 7.1 g/dL (ref 6.5–8.1)

## 2015-07-26 LAB — TYPE AND SCREEN
ABO/RH(D): A POS
Antibody Screen: NEGATIVE

## 2015-07-26 LAB — PROTIME-INR
INR: 1.1 (ref 0.00–1.49)
PROTHROMBIN TIME: 14.4 s (ref 11.6–15.2)

## 2015-07-26 LAB — SURGICAL PCR SCREEN
MRSA, PCR: NEGATIVE
Staphylococcus aureus: NEGATIVE

## 2015-07-26 LAB — APTT: aPTT: 36 seconds (ref 24–37)

## 2015-07-26 NOTE — Pre-Procedure Instructions (Signed)
    STELLA ENCARNACION  07/26/2015    Your procedure is scheduled on Thursday, September 15.  Report to [facility] Admitting at 6:00 A.M.  Call this number if you have problems the morning of surgery: [phone]   Remember:  Do not eat food or drink liquids after midnight Wednesday, September 14              Take these medicines the morning of surgery with A SIP OF WATER :metoprolol (LOPRESSOR),  OXYCONTIN, may use nasal swab and tiotropium (SPIRIVA).                   Take if needed: Nitroglycerin, HYDROcodone-acetaminophen ([place]).                               Stop Plavix per Dr Hendrickson's instructions.   Do not wear jewelry, make-up or nail polish.   Do not wear lotions, powders, or perfumes.                Men may shave face and neck.   Do not bring valuables to the hospital.   [facility] is not responsible for any belongings or valuables.  Contacts, dentures or bridgework may not be worn into surgery.  Leave your suitcase in the car.  After surgery it may be brought to your room.  For patients admitted to the hospital, discharge time will be determined by your treatment team.  Patients discharged the day of surgery will not be allowed to drive home.   Name and phone number of your driver:   -  Special instructions:  Review  [place] - Preparing For Surgery.  Please read over the following fact sheets that you were given. Pain Booklet, Coughing and Deep Breathing, Blood Transfusion Information and Surgical Site Infection Prevention

## 2015-07-28 ENCOUNTER — Encounter (HOSPITAL_COMMUNITY): Payer: Self-pay | Admitting: *Deleted

## 2015-07-28 ENCOUNTER — Other Ambulatory Visit: Payer: Self-pay

## 2015-07-28 ENCOUNTER — Encounter (HOSPITAL_COMMUNITY)
Admission: RE | Disposition: A | Discharge status: Home / Self Care | Payer: Self-pay | Source: Ambulatory Visit | Attending: Thoracic Surgery (Cardiothoracic Vascular Surgery)

## 2015-07-28 ENCOUNTER — Ambulatory Visit (HOSPITAL_COMMUNITY): Payer: Medicare Other | Admitting: Anesthesiology

## 2015-07-28 ENCOUNTER — Ambulatory Visit (HOSPITAL_COMMUNITY)
Admission: RE | Admit: 2015-07-28 | Discharge: 2015-07-28 | Disposition: A | Discharge status: Home / Self Care | Payer: Medicare Other | Source: Ambulatory Visit | Attending: Thoracic Surgery (Cardiothoracic Vascular Surgery) | Admitting: Thoracic Surgery (Cardiothoracic Vascular Surgery)

## 2015-07-28 DIAGNOSIS — Z955 Presence of coronary angioplasty implant and graft: Secondary | ICD-10-CM | POA: Diagnosis not present

## 2015-07-28 DIAGNOSIS — F17210 Nicotine dependence, cigarettes, uncomplicated: Secondary | ICD-10-CM | POA: Insufficient documentation

## 2015-07-28 DIAGNOSIS — Z7951 Long term (current) use of inhaled steroids: Secondary | ICD-10-CM | POA: Diagnosis not present

## 2015-07-28 DIAGNOSIS — C771 Secondary and unspecified malignant neoplasm of intrathoracic lymph nodes: Secondary | ICD-10-CM | POA: Insufficient documentation

## 2015-07-28 DIAGNOSIS — J449 Chronic obstructive pulmonary disease, unspecified: Secondary | ICD-10-CM | POA: Diagnosis not present

## 2015-07-28 DIAGNOSIS — Z7902 Long term (current) use of antithrombotics/antiplatelets: Secondary | ICD-10-CM | POA: Insufficient documentation

## 2015-07-28 DIAGNOSIS — Z79891 Long term (current) use of opiate analgesic: Secondary | ICD-10-CM | POA: Diagnosis not present

## 2015-07-28 DIAGNOSIS — Z79899 Other long term (current) drug therapy: Secondary | ICD-10-CM | POA: Diagnosis not present

## 2015-07-28 DIAGNOSIS — Z86718 Personal history of other venous thrombosis and embolism: Secondary | ICD-10-CM | POA: Insufficient documentation

## 2015-07-28 DIAGNOSIS — E785 Hyperlipidemia, unspecified: Secondary | ICD-10-CM | POA: Insufficient documentation

## 2015-07-28 DIAGNOSIS — R590 Localized enlarged lymph nodes: Secondary | ICD-10-CM | POA: Diagnosis not present

## 2015-07-28 DIAGNOSIS — M1990 Unspecified osteoarthritis, unspecified site: Secondary | ICD-10-CM | POA: Insufficient documentation

## 2015-07-28 DIAGNOSIS — K219 Gastro-esophageal reflux disease without esophagitis: Secondary | ICD-10-CM | POA: Diagnosis not present

## 2015-07-28 DIAGNOSIS — Z7982 Long term (current) use of aspirin: Secondary | ICD-10-CM | POA: Diagnosis not present

## 2015-07-28 DIAGNOSIS — I2510 Atherosclerotic heart disease of native coronary artery without angina pectoris: Secondary | ICD-10-CM | POA: Diagnosis not present

## 2015-07-28 DIAGNOSIS — M549 Dorsalgia, unspecified: Secondary | ICD-10-CM | POA: Diagnosis not present

## 2015-07-28 DIAGNOSIS — N400 Enlarged prostate without lower urinary tract symptoms: Secondary | ICD-10-CM | POA: Diagnosis not present

## 2015-07-28 DIAGNOSIS — I252 Old myocardial infarction: Secondary | ICD-10-CM | POA: Insufficient documentation

## 2015-07-28 DIAGNOSIS — I10 Essential (primary) hypertension: Secondary | ICD-10-CM | POA: Diagnosis not present

## 2015-07-28 DIAGNOSIS — C3490 Malignant neoplasm of unspecified part of unspecified bronchus or lung: Secondary | ICD-10-CM

## 2015-07-28 DIAGNOSIS — C801 Malignant (primary) neoplasm, unspecified: Secondary | ICD-10-CM | POA: Diagnosis not present

## 2015-07-28 HISTORY — PX: VIDEO BRONCHOSCOPY WITH ENDOBRONCHIAL ULTRASOUND: SHX6177

## 2015-07-28 SURGERY — BRONCHOSCOPY, WITH EBUS
Anesthesia: General

## 2015-07-28 MED ORDER — MIDAZOLAM HCL 2 MG/2ML IJ SOLN
INTRAMUSCULAR | Status: AC
  Filled 2015-07-28: qty 4

## 2015-07-28 MED ORDER — ONDANSETRON HCL 4 MG/2ML IJ SOLN
INTRAMUSCULAR | Status: AC
  Filled 2015-07-28: qty 2

## 2015-07-28 MED ORDER — EPHEDRINE SULFATE 50 MG/ML IJ SOLN
INTRAMUSCULAR | Status: AC
  Filled 2015-07-28: qty 1

## 2015-07-28 MED ORDER — DEXTROSE 5 % IV SOLN
1.5000 g | INTRAVENOUS | Status: AC
  Administered 2015-07-28: 1.5 g via INTRAVENOUS
  Filled 2015-07-28: qty 1.5

## 2015-07-28 MED ORDER — GLYCOPYRROLATE 0.2 MG/ML IJ SOLN
INTRAMUSCULAR | Status: AC
  Filled 2015-07-28: qty 2

## 2015-07-28 MED ORDER — LIDOCAINE HCL (CARDIAC) 20 MG/ML IV SOLN
INTRAVENOUS | Status: DC | PRN
  Administered 2015-07-28: 40 mg via INTRAVENOUS

## 2015-07-28 MED ORDER — FENTANYL CITRATE (PF) 100 MCG/2ML IJ SOLN
INTRAMUSCULAR | Status: AC
  Administered 2015-07-28: 25 ug via INTRAVENOUS
  Filled 2015-07-28: qty 2

## 2015-07-28 MED ORDER — EPINEPHRINE HCL 1 MG/ML IJ SOLN
INTRAMUSCULAR | Status: AC
  Filled 2015-07-28: qty 1

## 2015-07-28 MED ORDER — NEOSTIGMINE METHYLSULFATE 10 MG/10ML IV SOLN
INTRAVENOUS | Status: AC
  Filled 2015-07-28: qty 1

## 2015-07-28 MED ORDER — ONDANSETRON HCL 4 MG/2ML IJ SOLN
INTRAMUSCULAR | Status: DC | PRN
  Administered 2015-07-28: 4 mg via INTRAVENOUS

## 2015-07-28 MED ORDER — FENTANYL CITRATE (PF) 100 MCG/2ML IJ SOLN
INTRAMUSCULAR | Status: DC | PRN
  Administered 2015-07-28: 50 ug via INTRAVENOUS
  Administered 2015-07-28: 150 ug via INTRAVENOUS

## 2015-07-28 MED ORDER — CHLORHEXIDINE GLUCONATE 4 % EX LIQD: 60.0000 mL | Freq: Once | CUTANEOUS | Status: DC

## 2015-07-28 MED ORDER — SODIUM CHLORIDE 0.9 % IJ SOLN
INTRAMUSCULAR | Status: AC
  Filled 2015-07-28: qty 10

## 2015-07-28 MED ORDER — FENTANYL CITRATE (PF) 100 MCG/2ML IJ SOLN
25.0000 ug | INTRAMUSCULAR | Status: DC | PRN
  Administered 2015-07-28 (×3): 25 ug via INTRAVENOUS

## 2015-07-28 MED ORDER — NEOSTIGMINE METHYLSULFATE 10 MG/10ML IV SOLN
INTRAVENOUS | Status: DC | PRN
  Administered 2015-07-28: 3 mg via INTRAVENOUS

## 2015-07-28 MED ORDER — SODIUM CHLORIDE 0.9 % IV SOLN
10.0000 mg | INTRAVENOUS | Status: DC | PRN
  Administered 2015-07-28: 10 ug/min via INTRAVENOUS

## 2015-07-28 MED ORDER — PROPOFOL 10 MG/ML IV BOLUS
INTRAVENOUS | Status: AC
  Filled 2015-07-28: qty 20

## 2015-07-28 MED ORDER — EPINEPHRINE HCL 1 MG/ML IJ SOLN
INTRAMUSCULAR | Status: DC | PRN
  Administered 2015-07-28: 1 mg

## 2015-07-28 MED ORDER — ROCURONIUM BROMIDE 50 MG/5ML IV SOLN
INTRAVENOUS | Status: AC
  Filled 2015-07-28: qty 1

## 2015-07-28 MED ORDER — FENTANYL CITRATE (PF) 250 MCG/5ML IJ SOLN
INTRAMUSCULAR | Status: AC
  Filled 2015-07-28: qty 5

## 2015-07-28 MED ORDER — LIDOCAINE HCL (CARDIAC) 20 MG/ML IV SOLN
INTRAVENOUS | Status: AC
  Filled 2015-07-28: qty 5

## 2015-07-28 MED ORDER — PROPOFOL 10 MG/ML IV BOLUS
INTRAVENOUS | Status: DC | PRN
  Administered 2015-07-28: 20 mg via INTRAVENOUS
  Administered 2015-07-28: 150 mg via INTRAVENOUS

## 2015-07-28 MED ORDER — ROCURONIUM BROMIDE 100 MG/10ML IV SOLN
INTRAVENOUS | Status: DC | PRN
  Administered 2015-07-28 (×2): 10 mg via INTRAVENOUS
  Administered 2015-07-28: 30 mg via INTRAVENOUS

## 2015-07-28 MED ORDER — 0.9 % SODIUM CHLORIDE (POUR BTL) OPTIME
TOPICAL | Status: DC | PRN
  Administered 2015-07-28: 1000 mL

## 2015-07-28 MED ORDER — PROMETHAZINE HCL 25 MG/ML IJ SOLN: 6.2500 mg | INTRAMUSCULAR | Status: DC | PRN

## 2015-07-28 MED ORDER — GLYCOPYRROLATE 0.2 MG/ML IJ SOLN
INTRAMUSCULAR | Status: DC | PRN
Start: 2015-07-28 — End: 2015-07-28
  Administered 2015-07-28: 0.4 mg via INTRAVENOUS

## 2015-07-28 MED ORDER — MIDAZOLAM HCL 5 MG/5ML IJ SOLN
INTRAMUSCULAR | Status: DC | PRN
  Administered 2015-07-28: 2 mg via INTRAVENOUS

## 2015-07-28 MED ORDER — SUCCINYLCHOLINE CHLORIDE 20 MG/ML IJ SOLN
INTRAMUSCULAR | Status: AC
  Filled 2015-07-28: qty 1

## 2015-07-28 MED ORDER — LACTATED RINGERS IV SOLN
INTRAVENOUS | Status: DC | PRN
  Administered 2015-07-28: 07:00:00 via INTRAVENOUS

## 2015-07-28 SURGICAL SUPPLY — 64 items
APPLIER CLIP LOGIC TI 5 (MISCELLANEOUS) IMPLANT
BLADE SURG 15 STRL LF DISP TIS (BLADE) ×1 IMPLANT
BLADE SURG 15 STRL SS (BLADE) ×2
BRUSH CYTOL CELLEBRITY 1.5X140 (MISCELLANEOUS) IMPLANT
CANISTER SUCTION 2500CC (MISCELLANEOUS) ×6 IMPLANT
CLIP TI MEDIUM 6 (CLIP) IMPLANT
CONT SPEC 4OZ CLIKSEAL STRL BL (MISCELLANEOUS) ×9 IMPLANT
COTTONBALL LRG STERILE PKG (GAUZE/BANDAGES/DRESSINGS) IMPLANT
COVER DOME SNAP 22 D (MISCELLANEOUS) ×3 IMPLANT
COVER SURGICAL LIGHT HANDLE (MISCELLANEOUS) ×6 IMPLANT
COVER TABLE BACK 60X90 (DRAPES) ×3 IMPLANT
DERMABOND ADVANCED (GAUZE/BANDAGES/DRESSINGS)
DERMABOND ADVANCED .7 DNX12 (GAUZE/BANDAGES/DRESSINGS) IMPLANT
DRAPE CHEST BREAST 15X10 FENES (DRAPES) ×3 IMPLANT
ELECT REM PT RETURN 9FT ADLT (ELECTROSURGICAL) ×3
ELECTRODE REM PT RTRN 9FT ADLT (ELECTROSURGICAL) ×1 IMPLANT
FILTER STRAW FLUID ASPIR (MISCELLANEOUS) IMPLANT
FORCEPS BIOP RJ4 1.8 (CUTTING FORCEPS) IMPLANT
GAUZE SPONGE 4X4 12PLY STRL (GAUZE/BANDAGES/DRESSINGS) ×3 IMPLANT
GAUZE SPONGE 4X4 16PLY XRAY LF (GAUZE/BANDAGES/DRESSINGS) ×3 IMPLANT
GLOVE SURG SIGNA 7.5 PF LTX (GLOVE) ×6 IMPLANT
GOWN STRL REUS W/ TWL LRG LVL3 (GOWN DISPOSABLE) ×1 IMPLANT
GOWN STRL REUS W/ TWL XL LVL3 (GOWN DISPOSABLE) ×1 IMPLANT
GOWN STRL REUS W/TWL LRG LVL3 (GOWN DISPOSABLE) ×2
GOWN STRL REUS W/TWL XL LVL3 (GOWN DISPOSABLE) ×2
HEMOSTAT SURGICEL 2X14 (HEMOSTASIS) IMPLANT
KIT BASIN OR (CUSTOM PROCEDURE TRAY) IMPLANT
KIT CLEAN ENDO COMPLIANCE (KITS) ×3 IMPLANT
KIT ROOM TURNOVER OR (KITS) ×6 IMPLANT
MARKER SKIN DUAL TIP RULER LAB (MISCELLANEOUS) ×3 IMPLANT
NEEDLE 22X1 1/2 (OR ONLY) (NEEDLE) IMPLANT
NEEDLE BIOPSY TRANSBRONCH 21G (NEEDLE) IMPLANT
NEEDLE BLUNT 18X1 FOR OR ONLY (NEEDLE) IMPLANT
NEEDLE SONO TIP II EBUS (NEEDLE) ×6 IMPLANT
NS IRRIG 1000ML POUR BTL (IV SOLUTION) ×6 IMPLANT
OIL SILICONE PENTAX (PARTS (SERVICE/REPAIRS)) ×3 IMPLANT
PACK SURGICAL SETUP 50X90 (CUSTOM PROCEDURE TRAY) ×3 IMPLANT
PAD ARMBOARD 7.5X6 YLW CONV (MISCELLANEOUS) ×12 IMPLANT
PENCIL BUTTON HOLSTER BLD 10FT (ELECTRODE) ×3 IMPLANT
SPONGE INTESTINAL PEANUT (DISPOSABLE) IMPLANT
SUT SILK 2 0 (SUTURE)
SUT SILK 2-0 18XBRD TIE 12 (SUTURE) IMPLANT
SUT VIC AB 2-0 CT1 27 (SUTURE)
SUT VIC AB 2-0 CT1 TAPERPNT 27 (SUTURE) IMPLANT
SUT VIC AB 3-0 SH 18 (SUTURE) IMPLANT
SUT VIC AB 3-0 SH 27 (SUTURE)
SUT VIC AB 3-0 SH 27X BRD (SUTURE) IMPLANT
SUT VICRYL 4-0 PS2 18IN ABS (SUTURE) IMPLANT
SWAB COLLECTION DEVICE MRSA (MISCELLANEOUS) IMPLANT
SYR 20CC LL (SYRINGE) ×3 IMPLANT
SYR 20ML ECCENTRIC (SYRINGE) ×3 IMPLANT
SYR 5ML LL (SYRINGE) ×3 IMPLANT
SYR 5ML LUER SLIP (SYRINGE) ×3 IMPLANT
SYR CONTROL 10ML LL (SYRINGE) IMPLANT
SYRINGE 10CC LL (SYRINGE) ×3 IMPLANT
TOWEL OR 17X24 6PK STRL BLUE (TOWEL DISPOSABLE) ×3 IMPLANT
TOWEL OR 17X26 10 PK STRL BLUE (TOWEL DISPOSABLE) ×3 IMPLANT
TRAP SPECIMEN MUCOUS 40CC (MISCELLANEOUS) ×3 IMPLANT
TUBE ANAEROBIC SPECIMEN COL (MISCELLANEOUS) IMPLANT
TUBE CONNECTING 12'X1/4 (SUCTIONS) ×1
TUBE CONNECTING 12X1/4 (SUCTIONS) ×2 IMPLANT
TUBE CONNECTING 20'X1/4 (TUBING) ×1
TUBE CONNECTING 20X1/4 (TUBING) ×2 IMPLANT
WATER STERILE IRR 1000ML POUR (IV SOLUTION) ×3 IMPLANT

## 2015-07-28 NOTE — Brief Op Note (Signed)
07/28/2015  10:06 AM  PATIENT:  Andrew Shaw  69 y.o. male  PRE-OPERATIVE DIAGNOSIS:  MEDIASTINAL ADENOPATHY  POST-OPERATIVE DIAGNOSIS:  MEDIASTINAL ADENOPATHY- NON-SMALL CELL CARCINOMA  PROCEDURE:  Procedure(s): VIDEO BRONCHOSCOPY WITH ENDOBRONCHIAL ULTRASOUND (N/A)  SURGEON:  Surgeon(s) and Role:    * [name], MD - Primary   ANESTHESIA:   general  EBL:  Total I/O In: 1000 [I.V.:1000] Out: -   BLOOD ADMINISTERED:none  DRAINS: none   LOCAL MEDICATIONS USED:  NONE  SPECIMEN:  Source of Specimen:  subcarinal node  DISPOSITION OF SPECIMEN:  PATHOLOGY  PLAN OF CARE: Discharge to home after PACU  PATIENT DISPOSITION:  PACU - hemodynamically stable.   Delay start of Pharmacological VTE agent (>24hrs) due to surgical blood loss or risk of bleeding: not applicable  Level 7 node aspiration + Non-small cell carcinoma

## 2015-07-28 NOTE — Op Note (Signed)
NAME:  Andrew Shaw, Andrew Shaw NO.:  [id number]  MEDICAL RECORD NO.:  [id number]  LOCATION:  [facility]                         FACILITY:  [facility]  PHYSICIAN:  Revonda Standard. Roxan Hockey, M.D.DATE OF BIRTH:  06/26/1946  DATE OF PROCEDURE:  07/28/2015 DATE OF DISCHARGE:  07/28/2015                              OPERATIVE REPORT   PREOPERATIVE DIAGNOSIS:  Mediastinal adenopathy.  POSTOPERATIVE DIAGNOSIS:  Non-small cell carcinoma.  PROCEDURE:  Bronchoscopy and endobronchial ultrasound with mediastinal lymph node aspiration.  SURGEON:  Revonda Standard. Roxan Hockey, M.D.  ANESTHESIA:  General.  FINDINGS:  Level 7 node positive for non-small cell carcinoma.  CLINICAL NOTE:  Andrew Shaw is a 69 year old gentleman, who was found to have mediastinal adenopathy on a CT scan back in March of this year.  He was advised to have biopsy, but wished to wait until after his anniversary in August.  Repeat CT in August showed that the adenopathy had progressed slightly.  He was advised to undergo bronchoscopy, endobronchial ultrasound, and possible mediastinoscopy for diagnostic purposes.  The indications, risks, benefits, and alternatives were discussed in detail with the patient.  He understood and accepted the risks and agreed to proceed.  OPERATIVE NOTE:  Andrew Shaw was brought to the operating room on July 28, 2015.  He had induction of general anesthesia.  He was given intravenous antibiotics.  Sequential compression devices were placed on the calves for DVT prophylaxis.  After performing a time-out, flexible fiberoptic bronchoscopy was performed.  There was normal endobronchial anatomy and no endobronchial lesions to the level of the subsegmental bronchi.  There were thick white secretions.  Next, the endobronchial ultrasound probe was advanced. Systematic inspection of the mediastinal lymph node stations and right hilar station showed adenopathy in the 4R, 7, and 10L lymph node  stations. The best window was to the subcarinal node.  Aspirations were performed of that.  With each aspiration, the needle was advanced into the node under ultrasound guidance with suction applied.  10-12 passes with the needle were performed.  A portion of the specimen then was placed on slides for immediate examination and the remainder was placed into cytologic fluid for cell block.  Three aspirations were performed. These appeared to be good samples.  They were sent to Pathology.  While awaiting those results, additional aspiration was performed with all the material being placed into the cytologic preparation fluid.  The quick prep returned showing non-small cell carcinoma.  In order to have adequate tissue for cell block and molecular testing, 3 additional aspirations were performed of the level 7 node with all of the specimens being placed into cytologic fluid.  There was some mild bleeding from the mucosa after the biopsies, dilute epinephrine was applied, and there was no ongoing bleeding.  The scope was withdrawn.  The patient was extubated in the operating room and taken to the postanesthetic care unit in good condition.     Revonda Standard Roxan Hockey, M.D.   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

## 2015-07-28 NOTE — Transfer of Care (Signed)
Immediate Anesthesia Transfer of Care Note  Patient: Andrew Shaw  Procedure(s) Performed: Procedure(s): VIDEO BRONCHOSCOPY WITH ENDOBRONCHIAL ULTRASOUND (N/A)  Patient Location: PACU  Anesthesia Type:General  Level of Consciousness: awake, alert  and oriented  Airway & Oxygen Therapy: Patient Spontanous Breathing and Patient connected to face mask oxygen  Post-op Assessment: Report given to RN and Post -op Vital signs reviewed and stable  Post vital signs: Reviewed and stable  Last Vitals:  Filed Vitals:   07/28/15 0950  BP:   Pulse:   Temp: 36.3 C  Resp:     Complications: No apparent anesthesia complications

## 2015-07-28 NOTE — Anesthesia Postprocedure Evaluation (Signed)
  Anesthesia Post-op Note  Patient: Andrew Shaw  Procedure(s) Performed: Procedure(s) (LRB): VIDEO BRONCHOSCOPY WITH ENDOBRONCHIAL ULTRASOUND (N/A)  Patient Location: PACU  Anesthesia Type: General  Level of Consciousness: awake and alert   Airway and Oxygen Therapy: Patient Spontanous Breathing  Post-op Pain: mild  Post-op Assessment: Post-op Vital signs reviewed, Patient's Cardiovascular Status Stable, Respiratory Function Stable, Patent Airway and No signs of Nausea or vomiting  Last Vitals:  Filed Vitals:   07/28/15 1115  BP: 134/63  Pulse: 62  Temp:   Resp: 20    Post-op Vital Signs: stable   Complications: No apparent anesthesia complications

## 2015-07-28 NOTE — Discharge Instructions (Addendum)
Do not drive or engage in heavy physical activity for 24 hours  You may resume normal activities tomorrow  Resume plavix with your next scheduled dose  You may cough up small amounts of blood over the next few days  Call [phone] if you develop fever > 101F, chest pain, shortness of breath, or cough up large amounts of blood (> 2 tbsp at one time)  My office will contact you with follow up information

## 2015-07-28 NOTE — Anesthesia Preprocedure Evaluation (Addendum)
Anesthesia Evaluation  Patient identified by MRN, date of birth, ID band Patient awake    Reviewed: Allergy & Precautions, NPO status , Patient's Chart, lab work & pertinent test results, reviewed documented beta blocker date and time   History of Anesthesia Complications Negative for: history of anesthetic complications  Airway Mallampati: III  TM Distance: >3 FB Neck ROM: Full    Dental  (+) Dental Advisory Given, Edentulous Upper, Upper Dentures, Partial Lower   Pulmonary COPD, Current Smoker,    Pulmonary exam normal breath sounds clear to auscultation       Cardiovascular Exercise Tolerance: Poor hypertension, Pt. on medications and Pt. on home beta blockers (-) angina+ CAD, + Past MI, + Cardiac Stents (LCx BMS 12/2012) and + DVT  Normal cardiovascular exam Rhythm:Regular Rate:Normal     Neuro/Psych  Neuromuscular disease negative psych ROS   GI/Hepatic Neg liver ROS, GERD  Medicated,  Endo/Other  negative endocrine ROS  Renal/GU negative Renal ROS     Musculoskeletal  (+) Arthritis , Osteoarthritis,    Abdominal   Peds  Hematology negative hematology ROS (+)   Anesthesia Other Findings Day of surgery medications reviewed with the patient.  Reproductive/Obstetrics                           Anesthesia Physical Anesthesia Plan  ASA: III  Anesthesia Plan: General   Post-op Pain Management:    Induction: Intravenous  Airway Management Planned: Oral ETT  Additional Equipment:   Intra-op Plan:   Post-operative Plan: Extubation in OR and Possible Post-op intubation/ventilation  Informed Consent: I have reviewed the patients History and Physical, chart, labs and discussed the procedure including the risks, benefits and alternatives for the proposed anesthesia with the patient or authorized representative who has indicated his/her understanding and acceptance.   Dental advisory  given  Plan Discussed with: CRNA  Anesthesia Plan Comments: (Risks/benefits of general anesthesia discussed with patient including risk of damage to teeth, lips, gum, and tongue, nausea/vomiting, allergic reactions to medications, and the possibility of heart attack, stroke and death.  All patient questions answered.  Patient wishes to proceed.)        Anesthesia Quick Evaluation

## 2015-07-28 NOTE — Interval H&P Note (Signed)
History and Physical Interval Note:  07/28/2015 8:06 AM  Andrew Shaw  has presented today for surgery, with the diagnosis of MEDIASTINAL ADENOPATHY  The various methods of treatment have been discussed with the patient and family. After consideration of risks, benefits and other options for treatment, the patient has consented to  Procedure(s): VIDEO BRONCHOSCOPY WITH ENDOBRONCHIAL ULTRASOUND (N/A) POSSIBLE MEDIASTINOSCOPY (N/A) as a surgical intervention .  The patient's history has been reviewed, patient examined, no change in status, stable for surgery.  I have reviewed the patient's chart and labs.  Questions were answered to the patient's satisfaction.     [name]

## 2015-07-28 NOTE — H&P (View-Only) (Signed)
PCP is [name], MD Referring Provider is Alroy Dust, L.Marlou Sa, MD  Chief Complaint  Patient presents with  . Adenopathy    Surgical eval, right hilar and subcarinal lymphadenopathy, Chest CT8/9/16    HPI: Andrew Shaw is a 69 yo man who is sent for consultation regarding mediastinal and hilar adenopathy.  He has a past medical history significant for hypertension, hyperlipidemia, coronary artery disease with a previous stent, DVT, possible PE, back and neck surgery, and skin cancer. He presented in February of this year with chest pain. He ruled out for an MI. As part of his workup he had a CT angiogram of the chest to rule out pulmonary embolus. It showed mediastinal and hilar adenopathy. A repeat CT was done in June which showed the adenopathy was persistent. He saw Dr. Chase Caller who recommended biopsy, but the patient wanted to wait until after his anniversary in late August. A repeat CT in August showed persistent hilar and mediastinal adenopathy.  He complains of shortness of breath with exertion of moderate intensity. He gets tired when he works in his yard. He does not have any significant cough or wheezing. He's lost about 9 pounds over the past 6 months and 2 pounds over the past 3 months. He hasn't had any significant chest pain since his admission in February. He denies any change in his vision. He has multiple skin lesions over most of his body. These cause itching. He says that the biopsy showed cancer and "may be something else."  Zubrod Score: At the time of surgery this patient's most appropriate activity status/level should be described as: '[]'$     0    Normal activity, no symptoms '[x]'$     1    Restricted in physical strenuous activity but ambulatory, able to do out light work '[]'$     2    Ambulatory and capable of self care, unable to do work activities, up and about >50 % of waking hours                              '[]'$     3    Only limited self care, in bed greater than 50% of  waking hours '[]'$     4    Completely disabled, no self care, confined to bed or chair '[]'$     5    Moribund    Past Medical History  Diagnosis Date  . Coronary artery disease     2D ECHO, 10/31/2010 - EF >55%, normal; NUCLEAR STRESS TEST, 10/23/2010 - perfusion defect in inferior myocardial region, post-stress EF 62%, EKG negative for ischemia  . Hypertension   . Hyperlipemia   . Shortness of breath   . Chronic back pain   . DVT (deep venous thrombosis)     history 2004 after knee surg  . BPH (benign prostatic hyperplasia)   . Arthritis   . Neuromuscular disorder   . Carpal tunnel syndrome   . 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 GERD (gastroesophageal reflux disease)   . 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 S/P angioplasty with stent, BMS to LCX  12/23/12 12/23/2012  . Back pain 12/23/2012    Past Surgical History  Procedure Laterality Date  . Knee arthroscopy  04,06    left  . Joint replacement  2008    lt total knee  . Manipulation knee joint  2009  closed lt knee   . Hernia repair  2008    umb   . Cervical fusion  1999  . Back surgery  2004    lumb fusion  . Epidural block injection      multiple lumbar  . Carpal tunnel release  04/15/2012    Procedure: CARPAL TUNNEL RELEASE;  Surgeon: Wynonia Sours, MD;  Location: [facility];  Service: Orthopedics;  Laterality: Left;  . Trigger finger release  04/15/2012    Procedure: RELEASE TRIGGER FINGER/A-1 PULLEY;  Surgeon: Wynonia Sours, MD;  Location: [facility];  Service: Orthopedics;  Laterality: Left;  left thumb and little finger  . Cardiac catheterization  12/23/2012    Mid nondominant AV groove circumflex stented with a 2.5x27m Mini Vision stent resulting in a reduction of 90% stenosis to 0% residual  . Cardiac catheterization  02/07/2004    Noncritical CAD, continue medical therapy  . Cardiac catheterization  02/03/1999    Recommended medical therapy  . Left heart catheterization  with coronary angiogram N/A 12/23/2012    Procedure: LEFT HEART CATHETERIZATION WITH CORONARY ANGIOGRAM;  Surgeon: JLorretta Harp MD;  [facility];  Service: Cardiovascular;  Laterality: N/A;  . Percutaneous coronary stent intervention (pci-s)  12/23/2012    Procedure: PERCUTANEOUS CORONARY STENT INTERVENTION (PCI-S);  Surgeon: JLorretta Harp MD;  [facility];  Service: Cardiovascular;;  . Left heart catheterization with coronary angiogram N/A 01/11/2015    Procedure: LEFT HEART CATHETERIZATION WITH CORONARY ANGIOGRAM;  Surgeon: Peter M JMartinique MD;  [facility];  Service: Cardiovascular;  Laterality: N/A;    Family History  Problem Relation Age of Onset  . Heart disease Mother   . Hypertension Sister   . Diabetes Sister   . Cancer Brother     Social History Social History  Substance Use Topics  . Smoking status: Current Some Day Smoker -- 0.25 packs/day for 50 years    Types: Cigarettes  . Smokeless tobacco: Never Used  . Alcohol Use: No    Current Outpatient Prescriptions  Medication Sig Dispense Refill  . aspirin EC 81 MG tablet Take 81 mg by mouth daily.    . clopidogrel (PLAVIX) 75 MG tablet TAKE 1 TABLET BY MOUTH EVERY DAY 30 tablet 4  . fenofibrate (TRICOR) 48 MG tablet Take 48 mg by mouth daily.    . fluticasone (FLONASE) 50 MCG/ACT nasal spray Place 2 sprays into both nostrils daily. 16 g 2  . HYDROcodone-acetaminophen (NORCO) 10-325 MG per tablet Take 1 tablet by mouth every 4 (four) hours as needed (breakthrough pain).     . irbesartan (AVAPRO) 150 MG tablet Take 1 tablet (150 mg total) by mouth daily. 30 tablet 6  . metoprolol (LOPRESSOR) 50 MG tablet TAKE 1 TABLET BY MOUTH TWICE DAILY 60 tablet 9  . mometasone (ELOCON) 0.1 % cream Apply topically daily.   3  . nitroGLYCERIN (NITROSTAT) 0.4 MG SL tablet Place 1 tablet (0.4 mg total) under the tongue every 5 (five) minutes x 3 doses as needed for chest pain. 25 tablet 2  . OXYCONTIN 20 MG  T12A 12 hr tablet Take 20 mg by mouth every 8 (eight) hours.  0  . rosuvastatin (CRESTOR) 20 MG tablet Take 20 mg by mouth daily.    .Marland Kitchentiotropium (SPIRIVA) 18 MCG inhalation capsule Place 1 capsule (18 mcg total) into inhaler and inhale daily. 30 capsule 6   No current facility-administered medications for this visit.  Allergies  Allergen Reactions  . Lisinopril Rash and Other (See Comments)    Swelling of face  . Antihistamines, Chlorpheniramine-Type Itching  . Cortisone     MI  . Flexeril [Cyclobenzaprine] Itching    Review of Systems  Constitutional: Positive for unexpected weight change (lost 9 pounds in 6 months, 2 in last 3 months). Negative for fever, chills, activity change and appetite change.  HENT: Positive for dental problem. Negative for congestion and hearing loss.   Eyes: Negative for photophobia and visual disturbance.  Respiratory: Positive for shortness of breath. Negative for cough and wheezing.   Cardiovascular: Negative for chest pain and leg swelling.       Orthopnea  Endocrine: Negative for cold intolerance and heat intolerance.  Genitourinary: Negative for dysuria, urgency and frequency.  Musculoskeletal: Positive for back pain, gait problem, neck pain (2 previous surgeries) and neck stiffness.  Skin: Positive for color change, rash and wound.       Skin cancer  Neurological: Positive for numbness. Negative for headaches.  All other systems reviewed and are negative.   BP 156/80 mmHg  Pulse 67  Resp 20  Ht '5\' 6"'$  (1.676 m)  Wt 189 lb (85.73 kg)  BMI 30.52 kg/m2  SpO2 98% Physical Exam  Constitutional: He is oriented to person, place, and time. He appears well-developed and well-nourished. No distress.  HENT:  Head: Normocephalic and atraumatic.  Mouth/Throat: No oropharyngeal exudate.  Eyes: Conjunctivae and EOM are normal. No scleral icterus.  Neck: No tracheal deviation present. No thyromegaly present.  Limited ext of neck. Well healed scar   Cardiovascular: Normal rate, regular rhythm, normal heart sounds and intact distal pulses.  Exam reveals no gallop.   No murmur heard. Pulmonary/Chest: Effort normal. No respiratory distress. He has no wheezes. He has rales (faint).  Abdominal: Soft. Bowel sounds are normal. He exhibits no distension. There is no tenderness.  Musculoskeletal: He exhibits no edema.  Limited extension of neck  Lymphadenopathy:    He has no cervical adenopathy.  Neurological: He is alert and oriented to person, place, and time. He exhibits normal muscle tone. Coordination normal.  No focal motor deficit  Skin: Skin is warm and dry. Rash noted.  Multiple skin nodules, erythematous with scaling  Psychiatric: He has a normal mood and affect.  Vitals reviewed.    Diagnostic Tests: CT CHEST WITHOUT CONTRAST  TECHNIQUE: Multidetector CT imaging of the chest was performed following the standard protocol without intravenous contrast. High resolution imaging of the lungs, as well as inspiratory and expiratory imaging, was performed.  COMPARISON: Chest CT 04/22/2015.  FINDINGS: Mediastinum/Lymph Nodes: Heart size is normal. There is no significant pericardial fluid, thickening or pericardial calcification. There is atherosclerosis of the thoracic aorta, the great vessels of the mediastinum and the coronary arteries, including calcified atherosclerotic plaque in the left anterior descending, left circumflex and right coronary arteries. Again noted are enlarged subcarinal lymph nodes (2.4 cm in short axis) and enlarged right hilar lymph nodes (2.4 cm in short axis), similar to recent prior examination. Several other prominent but nonenlarged mediastinal lymph nodes are also noted. No definite left hilar adenopathy on today's noncontrast CT examination. Esophagus is unremarkable in appearance. No internal mammary or axillary lymphadenopathy.  Lungs/Pleura: Mild diffuse bronchial wall thickening with  very mild centrilobular and paraseptal emphysema, noted only in the lung apices. High-resolution imaging demonstrates a pattern of diffuse mild centrilobular ground-glass attenuation micronodularity, suggesting a smoking related disease such as respiratory bronchiolitis or respiratory bronchiolitis  interstitial lung disease. No other larger more suspicious appearing pulmonary nodules or masses are noted. Minimal subpleural reticulation is noted in the dependent portions of the lungs, which is highly nonspecific. No other larger areas of ground-glass attenuation, more extensive regions of subpleural reticulation, parenchymal banding, traction bronchiectasis or frank honeycombing is identified. Inspiratory and expiratory imaging demonstrates some mild air trapping, indicative of small airways disease.  Upper Abdomen: Extensive atherosclerosis.  Musculoskeletal/Soft Tissues: There are no aggressive appearing lytic or blastic lesions noted in the visualized portions of the skeleton.  IMPRESSION: 1. The appearance of the lungs suggests a smoking related disease such as respiratory bronchiolitis (RB), or if the patient is symptomatic, respiratory bronchiolitis interstitial lung disease (RB-LD). 2. Mild diffuse bronchial wall thickening with mild centrilobular and paraseptal emphysema, and mild air trapping; imaging findings suggestive of underlying COPD. 3. Persistent right hilar and subcarinal lymphadenopathy. Although this is relatively stable compared to the recent prior study from 04/22/2015, this does appear increased compared to the more remote prior examination 01/14/2015. Malignancy is not excluded. Correlation with bronchoscopy for biopsy is suggested to establish a tissue diagnosis, if clinically appropriate. While the possibility of a benign process such as, the asymmetry is concerning. Sarcoidosis is considered 4. Atherosclerosis, including three vessel coronary artery  disease. Please note that although the presence of coronary artery calcium documents the presence of coronary artery disease, the severity of this disease and any potential stenosis cannot be assessed on this non-gated CT examination. Assessment for potential risk factor modification, dietary therapy or pharmacologic therapy may be warranted, if clinically indicated.   Electronically Signed  By: Vinnie Langton M.D.  On: 06/21/2015 16:00        Impression: 69 year old man with mediastinal and hilar adenopathy and possibly some mild interstitial lung disease. His mediastinal adenopathy has persisted for 6 months. He needs a biopsy to determine the underlying cause. We discussed the differential diagnosis and the rationale for biopsy.  I recommended that we proceed with endobronchial ultrasound and possible mediastinal endoscopy for diagnostic purposes. I described each of the procedures individually. They would be done under general anesthesia. We discussed the indications, risks, benefits, and alternatives. He understands risks and endobronchial ultrasound include nondiagnostic study, bleeding, and pneumothorax. He understands there are risks associated with general anesthesia. He understands the risks of mediastinoscopy include, but are not limited to bleeding, recurrent nerve injury with hoarseness, stroke, esophageal injury, pneumothorax, as well as the possibility of other unforeseeable complications. I think it's about 50-50 that we will be able to get a diagnosis with ultrasound, so I think is appropriate to plan to proceed with mediastinoscopy unless a definitive diagnosis is available on the quick prep in the OR. Mediastinal endoscopy will likely be difficult due to the patient's previous neck surgery and limited extension.  I reviewed the general nature of the procedure. He understands this will be done on an outpatient basis. I reviewed the intraoperative decision making.  He  was upset when I mentioned that we would do the procedure at [facility]. He had a bad experience at her previously after his catheterization. Unfortunately, I do not have another alternative to offer him.  He will need to be off his Plavix for 5 days prior to surgery. I will check with Dr. Gwenlyn Found to make sure that is acceptable.  Plan:  Endobronchial ultrasound and possible mediastinal endoscopy in the OR on Thursday, 07/28/2015.  Hold Plavix after dose on Saturday, 07/23/2015  I spent 45 minutes with Andrew Shaw,  greater than 50% was in counseling  [name], MD [facility] [phone]  I personally reviewed the CT scans and concur with the findings as noted above  Remo Lipps C. Roxan Hockey, MD [facility] [phone]

## 2015-07-28 NOTE — Anesthesia Procedure Notes (Addendum)
Procedure Name: Intubation Date/Time: 07/28/2015 8:24 AM Performed by: Rush Farmer E Pre-anesthesia Checklist: Patient identified, Emergency Drugs available, Suction available, Patient being monitored and Timeout performed Patient Re-evaluated:Patient Re-evaluated prior to inductionOxygen Delivery Method: Circle system utilized Preoxygenation: Pre-oxygenation with 100% oxygen Intubation Type: IV induction Ventilation: Mask ventilation without difficulty Laryngoscope Size: Mac and 4 Grade View: Grade I Tube type: Oral Tube size: 8.5 mm Number of attempts: 1 Airway Equipment and Method: Stylet and LTA kit utilized Placement Confirmation: ETT inserted through vocal cords under direct vision,  positive ETCO2 and breath sounds checked- equal and bilateral Secured at: 21 cm Tube secured with: Tape Dental Injury: Teeth and Oropharynx as per pre-operative assessment

## 2015-07-29 ENCOUNTER — Telehealth: Payer: Self-pay | Admitting: *Deleted

## 2015-07-29 ENCOUNTER — Encounter (HOSPITAL_COMMUNITY): Payer: Self-pay | Admitting: Thoracic Surgery (Cardiothoracic Vascular Surgery)

## 2015-07-29 DIAGNOSIS — R590 Localized enlarged lymph nodes: Secondary | ICD-10-CM

## 2015-07-29 NOTE — Telephone Encounter (Signed)
Oncology Nurse Navigator Documentation  Oncology Nurse Navigator Flowsheets 07/29/2015  Referral date to RadOnc/MedOnc 07/28/2015  Navigator Encounter Type Introductory phone call/I received a referral on Andrew Shaw yesterday.  I called today to schedule.  He will be seen at thoracic clinic on 08/04/15 arrive at 2:30.  Patient verbalized understanding of appt time and place.   Treatment Phase Abnormal Scans  Interventions Coordination of Care  Coordination of Care MD Appointments  Time Spent with Patient 15

## 2015-08-03 ENCOUNTER — Telehealth: Payer: Self-pay | Admitting: *Deleted

## 2015-08-03 ENCOUNTER — Ambulatory Visit (HOSPITAL_COMMUNITY)
Admission: RE | Admit: 2015-08-03 | Discharge: 2015-08-03 | Disposition: A | Discharge status: Home / Self Care | Payer: Medicare Other | Source: Ambulatory Visit | Attending: Thoracic Surgery (Cardiothoracic Vascular Surgery) | Admitting: Thoracic Surgery (Cardiothoracic Vascular Surgery)

## 2015-08-03 DIAGNOSIS — I700 Atherosclerosis of aorta: Secondary | ICD-10-CM | POA: Diagnosis not present

## 2015-08-03 DIAGNOSIS — M4726 Other spondylosis with radiculopathy, lumbar region: Secondary | ICD-10-CM | POA: Diagnosis not present

## 2015-08-03 DIAGNOSIS — M47812 Spondylosis without myelopathy or radiculopathy, cervical region: Secondary | ICD-10-CM | POA: Diagnosis not present

## 2015-08-03 DIAGNOSIS — Z79899 Other long term (current) drug therapy: Secondary | ICD-10-CM | POA: Diagnosis not present

## 2015-08-03 DIAGNOSIS — C3490 Malignant neoplasm of unspecified part of unspecified bronchus or lung: Secondary | ICD-10-CM | POA: Diagnosis not present

## 2015-08-03 DIAGNOSIS — G894 Chronic pain syndrome: Secondary | ICD-10-CM | POA: Diagnosis not present

## 2015-08-03 DIAGNOSIS — Z79891 Long term (current) use of opiate analgesic: Secondary | ICD-10-CM | POA: Diagnosis not present

## 2015-08-03 LAB — GLUCOSE, CAPILLARY: GLUCOSE-CAPILLARY: 103 mg/dL — AB (ref 65–99)

## 2015-08-03 MED ORDER — FLUDEOXYGLUCOSE F - 18 (FDG) INJECTION
9.5000 | Freq: Once | INTRAVENOUS | Status: DC | PRN
  Administered 2015-08-03: 9.5 via INTRAVENOUS
  Filled 2015-08-03: qty 9.5

## 2015-08-03 NOTE — Telephone Encounter (Signed)
Called pt and confirmed 08/04/15 clinic appt w/ him.

## 2015-08-04 ENCOUNTER — Telehealth: Payer: Self-pay | Admitting: Internal Medicine

## 2015-08-04 ENCOUNTER — Encounter: Payer: Self-pay | Admitting: *Deleted

## 2015-08-04 ENCOUNTER — Ambulatory Visit (HOSPITAL_BASED_OUTPATIENT_CLINIC_OR_DEPARTMENT_OTHER): Payer: Medicare Other | Admitting: Internal Medicine

## 2015-08-04 ENCOUNTER — Other Ambulatory Visit (HOSPITAL_BASED_OUTPATIENT_CLINIC_OR_DEPARTMENT_OTHER): Payer: Medicare Other

## 2015-08-04 ENCOUNTER — Ambulatory Visit
Admission: RE | Admit: 2015-08-04 | Discharge: 2015-08-04 | Disposition: A | Discharge status: Home / Self Care | Payer: Medicare Other | Source: Ambulatory Visit | Attending: Radiation Oncology | Admitting: Radiation Oncology

## 2015-08-04 ENCOUNTER — Encounter: Payer: Self-pay | Admitting: Internal Medicine

## 2015-08-04 ENCOUNTER — Ambulatory Visit: Payer: Medicare Other | Attending: Internal Medicine | Admitting: Physical Therapy

## 2015-08-04 ENCOUNTER — Ambulatory Visit (HOSPITAL_BASED_OUTPATIENT_CLINIC_OR_DEPARTMENT_OTHER): Payer: Medicare Other

## 2015-08-04 VITALS — BP 157/64 | HR 63 | Temp 98.1°F | Resp 19 | Ht 65.0 in | Wt 190.9 lb

## 2015-08-04 DIAGNOSIS — R590 Localized enlarged lymph nodes: Secondary | ICD-10-CM

## 2015-08-04 DIAGNOSIS — R599 Enlarged lymph nodes, unspecified: Secondary | ICD-10-CM | POA: Diagnosis present

## 2015-08-04 DIAGNOSIS — G8929 Other chronic pain: Secondary | ICD-10-CM | POA: Diagnosis not present

## 2015-08-04 DIAGNOSIS — C3491 Malignant neoplasm of unspecified part of right bronchus or lung: Secondary | ICD-10-CM

## 2015-08-04 DIAGNOSIS — M545 Low back pain, unspecified: Secondary | ICD-10-CM

## 2015-08-04 DIAGNOSIS — M2560 Stiffness of unspecified joint, not elsewhere classified: Secondary | ICD-10-CM | POA: Insufficient documentation

## 2015-08-04 DIAGNOSIS — C3490 Malignant neoplasm of unspecified part of unspecified bronchus or lung: Secondary | ICD-10-CM | POA: Insufficient documentation

## 2015-08-04 DIAGNOSIS — R269 Unspecified abnormalities of gait and mobility: Secondary | ICD-10-CM | POA: Diagnosis not present

## 2015-08-04 DIAGNOSIS — Z23 Encounter for immunization: Secondary | ICD-10-CM | POA: Diagnosis present

## 2015-08-04 LAB — CBC WITH DIFFERENTIAL/PLATELET
BASO%: 0.3 % (ref 0.0–2.0)
Basophils Absolute: 0 10*3/uL (ref 0.0–0.1)
EOS ABS: 0.2 10*3/uL (ref 0.0–0.5)
EOS%: 4.5 % (ref 0.0–7.0)
HCT: 43.5 % (ref 38.4–49.9)
HGB: 14.7 g/dL (ref 13.0–17.1)
LYMPH%: 28.9 % (ref 14.0–49.0)
MCH: 30.2 pg (ref 27.2–33.4)
MCHC: 33.9 g/dL (ref 32.0–36.0)
MCV: 89.1 fL (ref 79.3–98.0)
MONO#: 0.9 10*3/uL (ref 0.1–0.9)
MONO%: 23.5 % — ABNORMAL HIGH (ref 0.0–14.0)
NEUT#: 1.6 10*3/uL (ref 1.5–6.5)
NEUT%: 42.8 % (ref 39.0–75.0)
PLATELETS: 199 10*3/uL (ref 140–400)
RBC: 4.88 10*6/uL (ref 4.20–5.82)
RDW: 14.1 % (ref 11.0–14.6)
WBC: 3.7 10*3/uL — ABNORMAL LOW (ref 4.0–10.3)
lymph#: 1.1 10*3/uL (ref 0.9–3.3)

## 2015-08-04 LAB — COMPREHENSIVE METABOLIC PANEL (CC13)
ALT: 20 U/L (ref 0–55)
ANION GAP: 6 meq/L (ref 3–11)
AST: 21 U/L (ref 5–34)
Albumin: 3.6 g/dL (ref 3.5–5.0)
Alkaline Phosphatase: 81 U/L (ref 40–150)
BILIRUBIN TOTAL: 0.47 mg/dL (ref 0.20–1.20)
BUN: 7.4 mg/dL (ref 7.0–26.0)
CHLORIDE: 99 meq/L (ref 98–109)
CO2: 21 meq/L — AB (ref 22–29)
Calcium: 9.2 mg/dL (ref 8.4–10.4)
Creatinine: 1 mg/dL (ref 0.7–1.3)
EGFR: 73 mL/min/{1.73_m2} — AB (ref >90)
Glucose: 114 mg/dl (ref 70–140)
Potassium: 4.1 mEq/L (ref 3.5–5.1)
Sodium: 127 mEq/L — ABNORMAL LOW (ref 136–145)
TOTAL PROTEIN: 6.9 g/dL (ref 6.4–8.3)

## 2015-08-04 MED ORDER — INFLUENZA VAC SPLIT QUAD 0.5 ML IM SUSY
0.5000 mL | PREFILLED_SYRINGE | Freq: Once | INTRAMUSCULAR | Status: AC
  Administered 2015-08-04: 0.5 mL via INTRAMUSCULAR
  Filled 2015-08-04: qty 0.5

## 2015-08-04 NOTE — Progress Notes (Signed)
[facility] Clinical Social Work  Clinical Social Work met with patient/family at [employer] appointment to offer support and assess for psychosocial needs.  Patient was accompanied by his spouse and daughter.  They reported no concerns at this time, his daughter shared she is encouraged by how positive her father has been throughout this process.   Clinical Social Work briefly discussed Clinical Social Work role and [employer] support programs/services.  Clinical Social Work encouraged patient to call with any additional questions or concerns.   Polo Riley, MSW, LCSW, OSW-C Clinical Social Worker [facility] [phone]

## 2015-08-04 NOTE — Therapy (Signed)
[place], [place], [address] Phone: [phone]   Fax:  [phone]  Physical Therapy Evaluation  Patient Details  Name: Andrew Shaw MRN: [id number] Date of Birth: 02-22-1946 Referring Provider:  Curt Bears, MD  Encounter Date: 08/04/2015      PT End of Session - 08/04/15 1657    Visit Number 1   Number of Visits 1   PT Start Time 0277   PT Stop Time 1630   PT Time Calculation (min) 25 min   Activity Tolerance Patient tolerated treatment well   Behavior During Therapy [facility] for tasks assessed/performed      Past Medical History  Diagnosis Date  . Coronary artery disease     2D ECHO, 10/31/2010 - EF >55%, normal; NUCLEAR STRESS TEST, 10/23/2010 - perfusion defect in inferior myocardial region, post-stress EF 62%, EKG negative for ischemia  . Hypertension   . Hyperlipemia   . Chronic back pain   . DVT (deep venous thrombosis)     history 2004 after knee surg  . BPH (benign prostatic hyperplasia)   . Arthritis   . Neuromuscular disorder   . Carpal tunnel syndrome   . 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 GERD (gastroesophageal reflux disease)   . 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 S/P angioplasty with stent, BMS to LCX  12/23/12 12/23/2012  . Back pain 12/23/2012  . Shortness of breath     with exertion  . COPD (chronic obstructive pulmonary disease)   . Carpal tunnel syndrome, right     Past Surgical History  Procedure Laterality Date  . Knee arthroscopy  04,06    left  . Joint replacement Left 2008    lt total knee  . Manipulation knee joint Left 2009    closed lt knee   . Hernia repair  2008    umb   . Cervical fusion  1999  . Back surgery  2004    lumb fusion  . Epidural block injection      multiple lumbar  . Carpal tunnel release  04/15/2012    Procedure: CARPAL TUNNEL RELEASE;  Surgeon: Wynonia Sours, MD;  Location: [place];   Service: Orthopedics;  Laterality: Left;  . Trigger finger release  04/15/2012    Procedure: RELEASE TRIGGER FINGER/A-1 PULLEY;  Surgeon: Wynonia Sours, MD;  Location: [place];  Service: Orthopedics;  Laterality: Left;  left thumb and little finger  . Cardiac catheterization  12/23/2012    Mid nondominant AV groove circumflex stented with a 2.5x44m Mini Vision stent resulting in a reduction of 90% stenosis to 0% residual  . Cardiac catheterization  02/07/2004    Noncritical CAD, continue medical therapy  . Cardiac catheterization  02/03/1999    Recommended medical therapy  . Left heart catheterization with coronary angiogram N/A 12/23/2012    Procedure: LEFT HEART CATHETERIZATION WITH CORONARY ANGIOGRAM;  Surgeon: JLorretta Harp MD;  [facility];  Service: Cardiovascular;  Laterality: N/A;  . Percutaneous coronary stent intervention (pci-s)  12/23/2012    Procedure: PERCUTANEOUS CORONARY STENT INTERVENTION (PCI-S);  Surgeon: JLorretta Harp MD;  [facility];  Service: Cardiovascular;;  . Left heart catheterization with coronary angiogram N/A 01/11/2015    Procedure: LEFT HEART CATHETERIZATION WITH CORONARY ANGIOGRAM;  Surgeon: Peter M JMartinique MD;  [facility];  Service: Cardiovascular;  Laterality: N/A;  . Colonoscopy  w/ polypectomy    . Video bronchoscopy with endobronchial ultrasound N/A 07/28/2015    Procedure: VIDEO BRONCHOSCOPY WITH ENDOBRONCHIAL ULTRASOUND;  Surgeon: [name], MD;  Location: [place];  Service: Thoracic;  Laterality: N/A;    There were no vitals filed for this visit.  Visit Diagnosis:  Low back pain of over 3 months duration - Plan: PT plan of care cert/re-cert  Stiffness of joints, multiple sites - Plan: PT plan of care cert/re-cert  Abnormal gait - Plan: PT plan of care cert/re-cert      Subjective Assessment - 08/04/15 1644    Subjective low back pain, left hip pain at times, left knee problems (has had TKA)    Patient is accompained by: Family member  wife and daughter   Pertinent History Patient presented in February 2016 with chest pain.  Work up showed mediastinal and hilar adenopathy.  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  Current smoker, MI in 20015 with stent placed; h/o 6 low back surgeries; left TKA 8 years ago.  HTN.   Sleeps in a recliner due to breathing problems and back pain.   h/o neck fusion.                                                       Donnal Debar ub a rek=ckubuer d   Patient Stated Goals get info from all clinic providers   Currently in Pain? 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   Precautions   Precautions Other (comment)   Precaution Comments cancer precautions   Restrictions   Weight Bearing Restrictions No   Balance Screen   Has the patient fallen in the past 6 months No   Has the patient had a decrease in activity level because of a fear of falling?  No   Is the patient reluctant to leave their home because of a fear of falling?  No   Home Ecologist residence   Living Arrangements Spouse/significant other   Type of [place] to enter   Entrance Stairs-Number of Steps [place] One level   Prior Function   Level of Independence Independent   Leisure likes to [place] and do other yardwork   Cognition   Overall Cognitive Status Within Functional Limits for tasks assessed   Functional Tests   Functional tests Sit to Stand   Sit to Stand   Comments 6  times in 30 seconds, well below average; limited by back pain and left knee dysfunction   Posture/Postural Control   Posture/Postural Control Postural limitations   Postural Limitations Forward head  weak abdominal wall   ROM / Strength   AROM / PROM / Strength AROM   AROM  Overall AROM Comments standing trunk AROM:  flexion--reaches 10 inches to floor; extension 20% loss; sidebend WFL bilat.; roation 50% loss right and 25% loss left   Ambulation/Gait   Ambulation/Gait Yes   Ambulation/Gait Assistance 6: Modified independent (Device/Increase time)   Gait Pattern Antalgic   Balance   Balance Assessed Yes   Dynamic Standing Balance   Dynamic Standing - Comments reaches 8 inches forward in standing (below average)                           PT Education - 08/20/15 1655    Education provided Yes   Education Details posture, breathing, energy conservation, walking, PT info   Person(s) Educated Patient;Spouse;Child(ren)   Methods Explanation;Handout   Comprehension Verbalized understanding               Lung Clinic Goals - 2015/08/20 1659    Patient will be able to verbalize understanding of the benefit of exercise to decrease fatigue.   Status Achieved   Patient will be able to verbalize the importance of posture.   Status Achieved   Patient will be able to demonstrate diaphragmatic breathing for improved lung function.   Status Achieved   Patient will be able to verbalize understanding of the role of physical therapy to prevent functional decline and who to contact if physical therapy is needed.   Status Achieved             Plan - 20-Aug-2015 1657    Clinical Impression Statement Patient with h/o longstanding low back pain with six surgeries, neck fusion, left TKA, left hip pain; now diagnosed with probable extensive stage small cell lung cancer.   Pt will benefit from skilled therapeutic intervention in order to improve on the following deficits Pain    Rehab Potential Fair   PT Frequency One time visit   PT Treatment/Interventions Patient/family education   PT Next Visit Plan None at this time.   Consulted and Agree with Plan of Care Patient          G-Codes - 2015-08-20 1659    Functional Assessment Tool Used clinical judgement   Functional Limitation Other PT primary  pain   Other PT Primary Current Status (S8546) At least 40 percent but less than 60 percent impaired, limited or restricted   Other PT Primary Goal Status (E7035) At least 40 percent but less than 60 percent impaired, limited or restricted   Other PT Primary Discharge Status (K0938) At least 40 percent but less than 60 percent impaired, limited or restricted       Problem List Patient Active Problem List   Diagnosis Date Noted  . Small cell carcinoma of lung August 20, 2015  . Other emphysema 07/11/2015  . ILD (interstitial lung disease) 07/11/2015  . Sinus drainage 07/11/2015  . Smoking history 05/06/2015  . Mediastinal adenopathy 05/06/2015  . Abnormal CT scan, chest 05/06/2015  . Pain in the chest   . Carpal tunnel syndrome of right wrist, may need surgery in near future 10/26/2013  . S/P angioplasty with stent, BMS to LCX  12/23/12 12/23/2012  . Back pain, followed at [facility] pain clinic 12/23/2012  . CAD (coronary artery disease), cath 2005 with non obstructive disease, now 12/2102 with LCX stenosis culprit vessel 12/22/2012  . Unstable angina 12/22/2012  . Dyslipidemia 12/22/2012  . HTN (hypertension) 12/22/2012  . Tobacco abuse 12/22/2012    SALISBURY,DONNA 20-Aug-2015, 5:02 PM  [place]  [place], [place], [address] Phone: [phone]   Fax:  [place], PT 08/04/2015 5:02 PM

## 2015-08-04 NOTE — Telephone Encounter (Signed)
Pt confirmed labs per 09/22 POF, gave pt AVS and Calendar.Cherylann Banas, sent msg to add chemo

## 2015-08-04 NOTE — Progress Notes (Signed)
Oncology Nurse Navigator Documentation  Oncology Nurse Navigator Flowsheets 08/04/2015  Navigator Encounter Type Clinic/MDC  Patient Visit Type Initial  Treatment Phase Abnormal Scans  Barriers/Navigation Needs Education  Education Understanding Cancer/ Treatment Options;Newly Diagnosed Cancer Education;Smoking cessation  Interventions -  Coordination of Care -  Time Spent with Patient 30         Thoracic Treatment Summary Name:Andrew Shaw Date:08/04/2015 DOB:[birth year] Your Medical Team Medical Oncologist:Dr. Julien Nordmann Radiation Oncologist:Dr. Pablo Ledger Surgeon:Dr. Roxan Hockey Type and Stage of Lung Cancer  Small Cell Carcinoma:Extensive Stage  Clinical Stage:  Small cell carcinoma of lung   Staging form: Lung, AJCC 7th Edition     Clinical stage from 08/04/2015: Stage IV (TX, N2, M1b) - Signed by Curt Bears, MD on 08/04/2015       Staging comments: Small cell lung cancer     Clinical stage is based on radiology exams.  Pathological stage will be determined after surgery.  Staging is based on the size of the tumor, involvement of lymph nodes or not, and whether or not the cancer center has spread. Recommendations  Recommendations: Chemotherapy   These recommendations are based on information available as of today's consult.  This is subject to change depending further testing or exams. Next Steps Next Step: Medical Oncology will set up follow up appointments  9/27 Chemo Education 10/6 1st chemotherapy  Barriers to Care What do you perceive as a potential barrier that may prevent you from receiving your treatment plan? Education-information given and explained  Financial FA will follow up as needed    Resources Given: Small Cell lung cancer booklet  Support/Resources Services at [email or web address] [phone] What to expect at [facility] information Fall Risk Information  Smoking Cessation card and information   Questions Norton Blizzard, RN BSN Thoracic Oncology Nurse Navigator at [place] is a nurse navigator that is available to assist you through your cancer journey.  She can answer your questions and/or provide resources regarding your treatment plan, emotional support, or financial concerns.

## 2015-08-04 NOTE — Progress Notes (Signed)
[place] [phone]   [phone] Multidisciplinary thoracic oncology clinic  CONSULT NOTE  REFERRING PHYSICIAN: Dr. Modesto Charon  REASON FOR CONSULTATION:  69 years old white male recently diagnosed with lung cancer.  HPI Andrew Shaw is a 69 y.o. male was past medical history significant for: I artery disease status post stent placement, dyslipidemia, hypertension, COPD and long history of smoking. The patient has been complaining of chest pain since February 2016. He had CT angiogram of the chest performed on 01/14/2015 and it showed no evidence of pulmonary embolism but there was mild mediastinal and right hilar lymphadenopathy which was nonspecific at that time. Repeat CT angiogram of the chest on 04/22/2015 showed no evidence of pulmonary embolism or active lung disease but it showed mild interval increase in the right hilar and subcarinal mediastinal lymphadenopathy suspicious for lymphoproliferative disorder, sarcoidosis or other granulomatous or inflammatory disease but metastatic disease was considered less likely at that time. The patient was referred to Dr. Chase Caller and repeat CT scan of the chest on 06/21/2015 showed persistent right hilar and subcarinal lymphadenopathy relatively stable compared to the scan in June 2016 and malignancy was not excluded. The patient was referred to Dr. Roxan Hockey and on 07/28/2015 he underwent bronchoscopy and endobronchial ultrasound with mediastinal lymph node aspiration. 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 Immunohistochemistry shows positivity with CD56, cytokeratin 7, cytokeratin AE1/AE3, chromogranin,synaptophysin and thyroid transcription factor-1 (TTF-1). A PET scan was performed on 08/03/2015 and it showed dominant hypermetabolic right hilar and subcarinal nodes  corresponding to the known primary bronchogenic neoplasm. There was also additional mediastinal and suspected bilateral axillary nodal metastasis. Dr. Roxan Hockey kindly referred the patient to multidisciplinary thoracic oncology clinic today for evaluation and recommendation regarding treatment of his condition. When seen today the patient continues to complain of shortness of breath and cough productive of whitish sputum but no significant chest pain or hemoptysis. He lost around 10 pounds in the last few weeks. He also complains of headache mainly on the right side. He denied having any significant nausea or vomiting, no fever or chills. Family history significant for mother with skin cancer, father died from aneurysm and brother had lung cancer. The patient is married and has 2 children. He was accompanied today by his wife Andrew Shaw and his daughter Andrew Shaw. He is currently retired and used to work as a Administrator. He has a history of smoking 1 pack per day for around 45 years and unfortunately continues to smoke about trying to quit. No history of alcohol or drug abuse.  HPI  Past Medical History  Diagnosis Date  . Coronary artery disease     2D ECHO, 10/31/2010 - EF >55%, normal; NUCLEAR STRESS TEST, 10/23/2010 - perfusion defect in inferior myocardial region, post-stress EF 62%, EKG negative for ischemia  . Hypertension   . Hyperlipemia   . Chronic back pain   . DVT (deep venous thrombosis)     history 2004 after knee surg  . BPH (benign prostatic hyperplasia)   . Arthritis   . Neuromuscular disorder   . Carpal tunnel syndrome   . 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 GERD (gastroesophageal reflux disease)   . 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 S/P angioplasty with stent, BMS to LCX  12/23/12 12/23/2012  . Back pain 12/23/2012  . Shortness of  breath     with exertion  . COPD (chronic obstructive pulmonary disease)   . Carpal tunnel syndrome, right     Past Surgical  History  Procedure Laterality Date  . Knee arthroscopy  04,06    left  . Joint replacement Left 2008    lt total knee  . Manipulation knee joint Left 2009    closed lt knee   . Hernia repair  2008    umb   . Cervical fusion  1999  . Back surgery  2004    lumb fusion  . Epidural block injection      multiple lumbar  . Carpal tunnel release  04/15/2012    Procedure: CARPAL TUNNEL RELEASE;  Surgeon: Wynonia Sours, MD;  Location: [facility];  Service: Orthopedics;  Laterality: Left;  . Trigger finger release  04/15/2012    Procedure: RELEASE TRIGGER FINGER/A-1 PULLEY;  Surgeon: Wynonia Sours, MD;  Location: [facility];  Service: Orthopedics;  Laterality: Left;  left thumb and little finger  . Cardiac catheterization  12/23/2012    Mid nondominant AV groove circumflex stented with a 2.5x20m Mini Vision stent resulting in a reduction of 90% stenosis to 0% residual  . Cardiac catheterization  02/07/2004    Noncritical CAD, continue medical therapy  . Cardiac catheterization  02/03/1999    Recommended medical therapy  . Left heart catheterization with coronary angiogram N/A 12/23/2012    Procedure: LEFT HEART CATHETERIZATION WITH CORONARY ANGIOGRAM;  Surgeon: JLorretta Harp MD;  [facility];  Service: Cardiovascular;  Laterality: N/A;  . Percutaneous coronary stent intervention (pci-s)  12/23/2012    Procedure: PERCUTANEOUS CORONARY STENT INTERVENTION (PCI-S);  Surgeon: JLorretta Harp MD;  [facility];  Service: Cardiovascular;;  . Left heart catheterization with coronary angiogram N/A 01/11/2015    Procedure: LEFT HEART CATHETERIZATION WITH CORONARY ANGIOGRAM;  Surgeon: Peter M JMartinique MD;  [facility];  Service: Cardiovascular;  Laterality: N/A;  . Colonoscopy w/ polypectomy    . Video bronchoscopy with endobronchial ultrasound N/A 07/28/2015    Procedure: VIDEO BRONCHOSCOPY WITH ENDOBRONCHIAL ULTRASOUND;  Surgeon: SMelrose Nakayama  MD;  [facility];  Service: Thoracic;  Laterality: N/A;    Family History  Problem Relation Age of Onset  . Heart disease Mother   . Hypertension Sister   . Diabetes Sister   . Cancer Brother     Social History Social History  Substance Use Topics  . Smoking status: Current Some Day Smoker -- 0.25 packs/day for 50 years    Types: Cigarettes  . Smokeless tobacco: Never Used  . Alcohol Use: No    Allergies  Allergen Reactions  . Lisinopril Rash and Other (See Comments)    Swelling of face  . Other Rash    Dr PMalachi Bonds . Antihistamines, Chlorpheniramine-Type Itching  . Cortisone Other (See Comments)    MI  . Flexeril [Cyclobenzaprine] Itching  . Latex Rash    Current Outpatient Prescriptions  Medication Sig Dispense Refill  . aspirin EC 81 MG tablet Take 81 mg by mouth daily.    . clopidogrel (PLAVIX) 75 MG tablet TAKE 1 TABLET BY MOUTH EVERY DAY (Patient taking differently: TAKE 75 MG BY MOUTH EVERY DAY) 30 tablet 4  . fenofibrate (TRICOR) 48 MG tablet Take 48 mg by mouth daily.    . fluticasone (FLONASE) 50 MCG/ACT nasal spray Place 2 sprays into both nostrils daily. (Patient taking differently: Place 2  sprays into both nostrils daily as needed for allergies. ) 16 g 2  . HYDROcodone-acetaminophen (NORCO) 10-325 MG per tablet Take 1 tablet by mouth every 4 (four) hours as needed (breakthrough pain).     . irbesartan (AVAPRO) 150 MG tablet Take 1 tablet (150 mg total) by mouth daily. 30 tablet 6  . metoprolol (LOPRESSOR) 50 MG tablet TAKE 1 TABLET BY MOUTH TWICE DAILY (Patient taking differently: TAKE 50 MG BY MOUTH TWICE DAILY) 60 tablet 9  . mometasone (ELOCON) 0.1 % cream Apply 1 application topically daily as needed (for rash).   3  . OXYCONTIN 20 MG T12A 12 hr tablet Take 20 mg by mouth every 8 (eight) hours.  0  . rosuvastatin (CRESTOR) 20 MG tablet Take 20 mg by mouth daily.    Marland Kitchen tiotropium (SPIRIVA) 18 MCG inhalation capsule Place 1 capsule (18 mcg total) into  inhaler and inhale daily. 30 capsule 6  . neomycin-bacitracin-polymyxin (NEOSPORIN) ointment Apply 1 application topically daily.    . nitroGLYCERIN (NITROSTAT) 0.4 MG SL tablet Place 1 tablet (0.4 mg total) under the tongue every 5 (five) minutes x 3 doses as needed for chest pain. (Patient not taking: Reported on 08/04/2015) 25 tablet 2   No current facility-administered medications for this visit.   Facility-Administered Medications Ordered in Other Visits  Medication Dose Route Frequency Provider Last Rate Last Dose  . fludeoxyglucose F - 18 (FDG) injection 9.5 milli Curie  9.5 milli Curie Intravenous Once PRN Medication Radiologist, MD   9.5 milli Curie at 08/03/15 1517    Review of Systems  Constitutional: positive for fatigue and weight loss Eyes: negative Ears, nose, mouth, throat, and face: negative Respiratory: positive for cough, dyspnea on exertion and sputum Cardiovascular: negative Gastrointestinal: negative Genitourinary:negative Integument/breast: negative Hematologic/lymphatic: negative Musculoskeletal:negative Neurological: positive for headaches Behavioral/Psych: negative Endocrine: negative Allergic/Immunologic: negative  Physical Exam  YBO:FBPZW, healthy, no distress, well nourished and well developed SKIN: skin color, texture, turgor are normal, no rashes or significant lesions HEAD: Normocephalic, No masses, lesions, tenderness or abnormalities EYES: normal, PERRLA, Conjunctiva are pink and non-injected EARS: External ears normal, Canals clear OROPHARYNX:no exudate, no erythema and lips, buccal mucosa, and tongue normal  NECK: supple, no adenopathy, no JVD LYMPH:  no palpable lymphadenopathy, no hepatosplenomegaly LUNGS: clear to auscultation , and palpation HEART: regular rate & rhythm, no murmurs and no gallops ABDOMEN:abdomen soft, non-tender, normal bowel sounds and no masses or organomegaly BACK: Back symmetric, no curvature., No CVA  tenderness EXTREMITIES:no joint deformities, effusion, or inflammation, no edema, no skin discoloration  NEURO: alert & oriented x 3 with fluent speech, no focal motor/sensory deficits  PERFORMANCE STATUS: ECOG 1  LABORATORY DATA: Lab Results  Component Value Date   WBC 3.7* 08/04/2015   HGB 14.7 08/04/2015   HCT 43.5 08/04/2015   MCV 89.1 08/04/2015   PLT 199 08/04/2015      Chemistry      Component Value Date/Time   NA 127* 08/04/2015 1413   NA 126* 07/26/2015 1451   K 4.1 08/04/2015 1413   K 4.5 07/26/2015 1451   CL 96* 07/26/2015 1451   CO2 21* 08/04/2015 1413   CO2 23 07/26/2015 1451   BUN 7.4 08/04/2015 1413   BUN 7 07/26/2015 1451   CREATININE 1.0 08/04/2015 1413   CREATININE 1.09 07/26/2015 1451      Component Value Date/Time   CALCIUM 9.2 08/04/2015 1413   CALCIUM 9.0 07/26/2015 1451   ALKPHOS 81 08/04/2015 1413   ALKPHOS  68 07/26/2015 1451   AST 21 08/04/2015 1413   AST 23 07/26/2015 1451   ALT 20 08/04/2015 1413   ALT 19 07/26/2015 1451   BILITOT 0.47 08/04/2015 1413   BILITOT 0.7 07/26/2015 1451       RADIOGRAPHIC STUDIES: Dg Chest 2 View  07/26/2015   CLINICAL DATA:  Mediastinal and hilar adenopathy.  EXAM: CHEST  2 VIEW  COMPARISON:  CT 06/21/2015. Chest x-ray 01/08/2015, 12/22/2012, 11/13/2010.  FINDINGS: Mediastinum appears stable. No mediastinal widening noted. Mild right hilar fullness again noted consistent with adenopathy. No interim change. Mild chronic interstitial changes are noted throughout both lungs. Basal pleural-parenchymal thickening consistent with scarring. No acute infiltrate No pleural effusion or pneumothorax. No acute bony abnormality. Degenerative changes thoracic spine. Prior cervical spine fusion.  IMPRESSION: 1. Stable mild right hilar fullness consistent with stable adenopathy. No interim change from prior exams. 2. Mild stable interstitial prominence noted bilaterally consistent chronic interstitial lung disease. Basal pleural  parenchymal thickening noted consistent with scarring. No acute pulmonary disease noted.   Electronically Signed   By: Marcello Moores  Register   On: 07/26/2015 15:46   Nm Pet Image Initial (pi) Skull Base To Thigh  08/03/2015   CLINICAL DATA:  Initial treatment strategy for squamous cell lung cancer.  EXAM: NUCLEAR MEDICINE PET SKULL BASE TO THIGH  TECHNIQUE: 9.5 mCi F-18 FDG was injected intravenously. Full-ring PET imaging was performed from the skull base to thigh after the radiotracer. CT data was obtained and used for attenuation correction and anatomic localization.  FASTING BLOOD GLUCOSE:  Value: 103 mg/dl  COMPARISON:  High-resolution CT chest dated 06/21/2015  FINDINGS: NECK  No hypermetabolic lymph nodes in the neck.  Degenerative uptake involving the cervical spine.  CHEST  Dominant right hilar node measuring approximately 2.1 cm short axis, max SUV 13.7. 2.3 cm short axis subcarinal node, max SUV 10.2. Small right paratracheal and prevascular nodes measuring 709 mm short axis, max SUV 4.0. Small bilateral axillary lymph nodes measuring measuring up to 8 mm short axis (series 4/ image 58), max SUV 3.8.  Lungs are essentially clear. No suspicious pulmonary nodules. Mild centrilobular and paraseptal emphysematous changes. No pleural effusion or pneumothorax.  ABDOMEN/PELVIS  No abnormal hypermetabolic activity within the liver, pancreas, adrenal glands, or spleen.  Atherosclerotic calcifications of the abdominal aorta and branch vessels. Bladder is mildly thick-walled although underdistended. Fat within the bilateral inguinal canals.  No hypermetabolic lymph nodes in the abdomen or pelvis.  SKELETON  No focal hypermetabolic activity to suggest skeletal metastasis.  IMPRESSION: Dominant hypermetabolic right hilar and subcarinal nodes, corresponding to known primary bronchogenic neoplasm.  Additional mediastinal and suspected bilateral axillary nodal metastases.   Electronically Signed   By: Julian Hy  M.D.   On: 08/03/2015 17:47    ASSESSMENT: This is a very pleasant 69 years old white male recently diagnosed with extensive stage (TX, N2, M1b) small cell lung cancer in September 2016 presenting with right hilar and mediastinal lymphadenopathy as well as highly suspicious metastatic disease in the axilla bilaterally.   PLAN: I had a lengthy discussion with the patient and his family today about his current disease stage, prognosis and treatment options. I will complete the staging workup by ordering a MRI of the brain to rule out brain metastasis. The patient has the understanding that he has incurable condition and also treatment will be off palliative nature. I discussed with the patient his treatment options including palliative care and hospice referral versus consideration of systemic  chemotherapy with carboplatin for AUC of 5 on day 1 and etoposide at 120 MG/M2 on days 1, 2 and 3 with Neulasta support on day 4. The patient is interested in systemic chemotherapy. I discussed with him the adverse effect of this treatment including but not limited to alopecia, myelosuppression, nausea and vomiting, peripheral neuropathy, liver or renal dysfunction. The patient is expected to start the first cycle of this treatment on 08/15/2015. If he has any residual disease after completion of the chemotherapy, he may be considered for consolidation radiotherapy to the residual disease. The patient his wife agreed to the current plan. I will arrange for the patient to have a chemotherapy education class before starting the first dose of his treatment. I will call his pharmacy with prescription for Compazine 10 mg by mouth every 6 hours as needed for nausea. The patient would come back for follow-up visit in 3 weeks for reevaluation and management of any adverse effect of his treatment. He was seen during the multidisciplinary thoracic oncology clinic today by medical oncology, radiation oncology, thoracic  navigator, social worker and physical therapist. The patient was advised to call immediately if he has any concerning symptoms in the interval. The patient voices understanding of current disease status and treatment options and is in agreement with the current care plan.  All questions were answered. The patient knows to call the clinic with any problems, questions or concerns. We can certainly see the patient much sooner if necessary.  Thank you so much for allowing me to participate in the care of Andrew Shaw. I will continue to follow up the patient with you and assist in his care.  I spent 50 minutes counseling the patient face to face. The total time spent in the appointment was 80 minutes.  Disclaimer: This note was dictated with voice recognition software. Similar sounding words can inadvertently be transcribed and may not be corrected upon review.   MOHAMED,MOHAMED K. August 04, 2015, 4:06 PM

## 2015-08-04 NOTE — Progress Notes (Signed)
  Radiation Oncology         [phone]) [phone] ________________________________  Initial Outpatient Consultation - Date: 08/04/2015   Name: JAVARUS DORNER MRN: [id number]   DOB: 02/18/1946  REFERRING PHYSICIAN: [name], *  DIAGNOSIS AND STAGE: Extensive stage small cell carcinoma  HISTORY OF PRESENT ILLNESS::Keric R Akhavan is a 69 y.o. male who presented in February with chest pain. Workup showed mediastinal and hilar adenopathy. He was referred to a pulmonologist who commended biopsy. He delayed this until August of this year. A CT scan was performed showing enlarged subcarinal and hilar lymph nodes and several other mediastinal lymph nodes were noted. He underwent bronchoscopy and biopsy on 9/15 which showed small cell carcinoma in a level 7 lymph node. A PET scan was performed on 9/21 showing an SUV of 13.7 in the right hilar lymph node and uptake in the subcarinal and other mediastinal lymph nodes. Small bilateral axillay lymph nodes measured 8 mm and had an SUV of 3.8. These were suspicious for metastatic disease. He has not had an MRI of the brain and he has discussed chemo with Dr. Julien Nordmann. He is accompanied by his wife and daughter. He is currently smoking and trying to quit. He denies any bone pain or headaches, but complains of weight loss.  PREVIOUS RADIATION THERAPY: No  Past medical, social and family history were reviewed in the electronic chart. Review of symptoms was reviewed in the electronic chart. Medications were reviewed in the electronic chart.   PHYSICAL EXAM: There were no vitals filed for this visit.. . Pleasant no res dis. Appears stated age. Alert and oriented times three.  IMPRESSION: Extensive stage small cell cancer  PLAN: He will undergo definitive chemo at this time. If he has a good response, he will be referred back to me for radiation to his chest and prophylactic cranial radiation. I discussed this with him and family. We discussed 10 treatments as  an outpatient. I plan on seeing him back after his chemo is complete.   I spent 30 minutes  face to face with the patient and more than 50% of that time was spent in counseling and/or coordination of care.   This document serves as a record of services personally performed by Thea Silversmith, MD. It was created on her behalf by Darcus Austin, a trained medical scribe. The creation of this record is based on the scribe's personal observations and the Philbert Ocallaghan's statements to them. This document has been checked and approved by the attending Fleta Borgeson.   ------------------------------------------------  Thea Silversmith, MD

## 2015-08-08 ENCOUNTER — Ambulatory Visit (HOSPITAL_COMMUNITY)
Admission: RE | Admit: 2015-08-08 | Discharge: 2015-08-08 | Disposition: A | Discharge status: Home / Self Care | Payer: Medicare Other | Source: Ambulatory Visit | Attending: Thoracic Surgery (Cardiothoracic Vascular Surgery) | Admitting: Thoracic Surgery (Cardiothoracic Vascular Surgery)

## 2015-08-08 ENCOUNTER — Other Ambulatory Visit: Payer: Self-pay | Admitting: *Deleted

## 2015-08-08 DIAGNOSIS — R51 Headache: Secondary | ICD-10-CM | POA: Insufficient documentation

## 2015-08-08 DIAGNOSIS — C3490 Malignant neoplasm of unspecified part of unspecified bronchus or lung: Secondary | ICD-10-CM

## 2015-08-08 DIAGNOSIS — C3491 Malignant neoplasm of unspecified part of right bronchus or lung: Secondary | ICD-10-CM | POA: Diagnosis not present

## 2015-08-08 MED ORDER — GADOBENATE DIMEGLUMINE 529 MG/ML IV SOLN
15.0000 mL | Freq: Once | INTRAVENOUS | Status: AC | PRN
  Administered 2015-08-08: 18 mL via INTRAVENOUS

## 2015-08-09 ENCOUNTER — Other Ambulatory Visit (HOSPITAL_BASED_OUTPATIENT_CLINIC_OR_DEPARTMENT_OTHER): Payer: Medicare Other

## 2015-08-09 ENCOUNTER — Telehealth: Payer: Self-pay | Admitting: *Deleted

## 2015-08-09 ENCOUNTER — Other Ambulatory Visit: Payer: Medicare Other

## 2015-08-09 ENCOUNTER — Encounter: Payer: Self-pay | Admitting: *Deleted

## 2015-08-09 DIAGNOSIS — C3490 Malignant neoplasm of unspecified part of unspecified bronchus or lung: Secondary | ICD-10-CM

## 2015-08-09 LAB — CBC WITH DIFFERENTIAL/PLATELET
BASO%: 0.4 % (ref 0.0–2.0)
Basophils Absolute: 0 10*3/uL (ref 0.0–0.1)
EOS%: 3.2 % (ref 0.0–7.0)
Eosinophils Absolute: 0.1 10*3/uL (ref 0.0–0.5)
HEMATOCRIT: 43.6 % (ref 38.4–49.9)
HEMOGLOBIN: 14.7 g/dL (ref 13.0–17.1)
LYMPH%: 22.6 % (ref 14.0–49.0)
MCH: 30.3 pg (ref 27.2–33.4)
MCHC: 33.7 g/dL (ref 32.0–36.0)
MCV: 89.9 fL (ref 79.3–98.0)
MONO#: 0.7 10*3/uL (ref 0.1–0.9)
MONO%: 14.7 % — ABNORMAL HIGH (ref 0.0–14.0)
NEUT%: 59.1 % (ref 39.0–75.0)
NEUTROS ABS: 2.7 10*3/uL (ref 1.5–6.5)
Platelets: 213 10*3/uL (ref 140–400)
RBC: 4.85 10*6/uL (ref 4.20–5.82)
RDW: 14.1 % (ref 11.0–14.6)
WBC: 4.5 10*3/uL (ref 4.0–10.3)
lymph#: 1 10*3/uL (ref 0.9–3.3)

## 2015-08-09 LAB — COMPREHENSIVE METABOLIC PANEL (CC13)
ALBUMIN: 3.7 g/dL (ref 3.5–5.0)
ALK PHOS: 77 U/L (ref 40–150)
ALT: 22 U/L (ref 0–55)
AST: 25 U/L (ref 5–34)
Anion Gap: 6 mEq/L (ref 3–11)
BILIRUBIN TOTAL: 0.63 mg/dL (ref 0.20–1.20)
BUN: 7.4 mg/dL (ref 7.0–26.0)
CALCIUM: 9 mg/dL (ref 8.4–10.4)
CO2: 21 mEq/L — ABNORMAL LOW (ref 22–29)
Chloride: 98 mEq/L (ref 98–109)
Creatinine: 1.1 mg/dL (ref 0.7–1.3)
EGFR: 65 mL/min/{1.73_m2} — AB (ref >90)
Glucose: 117 mg/dl (ref 70–140)
POTASSIUM: 4.2 meq/L (ref 3.5–5.1)
Sodium: 125 mEq/L — ABNORMAL LOW (ref 136–145)
TOTAL PROTEIN: 6.9 g/dL (ref 6.4–8.3)

## 2015-08-09 NOTE — Telephone Encounter (Signed)
I have added chemo per POf. Patient will pick up calendar in chemo class

## 2015-08-12 ENCOUNTER — Other Ambulatory Visit: Payer: Self-pay | Admitting: *Deleted

## 2015-08-12 DIAGNOSIS — C3490 Malignant neoplasm of unspecified part of unspecified bronchus or lung: Secondary | ICD-10-CM

## 2015-08-15 ENCOUNTER — Ambulatory Visit (HOSPITAL_BASED_OUTPATIENT_CLINIC_OR_DEPARTMENT_OTHER): Payer: Medicare Other

## 2015-08-15 ENCOUNTER — Other Ambulatory Visit: Payer: Self-pay | Admitting: *Deleted

## 2015-08-15 ENCOUNTER — Other Ambulatory Visit (HOSPITAL_BASED_OUTPATIENT_CLINIC_OR_DEPARTMENT_OTHER): Payer: Medicare Other

## 2015-08-15 ENCOUNTER — Other Ambulatory Visit: Payer: Self-pay | Admitting: Internal Medicine

## 2015-08-15 VITALS — BP 143/63 | HR 69 | Temp 97.8°F

## 2015-08-15 DIAGNOSIS — Z5111 Encounter for antineoplastic chemotherapy: Secondary | ICD-10-CM

## 2015-08-15 DIAGNOSIS — C3490 Malignant neoplasm of unspecified part of unspecified bronchus or lung: Secondary | ICD-10-CM

## 2015-08-15 DIAGNOSIS — C3491 Malignant neoplasm of unspecified part of right bronchus or lung: Secondary | ICD-10-CM | POA: Diagnosis present

## 2015-08-15 LAB — COMPREHENSIVE METABOLIC PANEL (CC13)
ALBUMIN: 3.6 g/dL (ref 3.5–5.0)
ALK PHOS: 79 U/L (ref 40–150)
ALT: 19 U/L (ref 0–55)
AST: 22 U/L (ref 5–34)
Anion Gap: 7 mEq/L (ref 3–11)
BUN: 8 mg/dL (ref 7.0–26.0)
CALCIUM: 9.5 mg/dL (ref 8.4–10.4)
CO2: 20 mEq/L — ABNORMAL LOW (ref 22–29)
Chloride: 100 mEq/L (ref 98–109)
Creatinine: 1.1 mg/dL (ref 0.7–1.3)
EGFR: 68 mL/min/{1.73_m2} — AB (ref >90)
GLUCOSE: 120 mg/dL (ref 70–140)
POTASSIUM: 4.3 meq/L (ref 3.5–5.1)
SODIUM: 127 meq/L — AB (ref 136–145)
Total Bilirubin: 0.6 mg/dL (ref 0.20–1.20)
Total Protein: 7 g/dL (ref 6.4–8.3)

## 2015-08-15 LAB — CBC WITH DIFFERENTIAL/PLATELET
BASO%: 0.5 % (ref 0.0–2.0)
BASOS ABS: 0 10*3/uL (ref 0.0–0.1)
EOS ABS: 0.1 10*3/uL (ref 0.0–0.5)
EOS%: 2.2 % (ref 0.0–7.0)
HCT: 43.1 % (ref 38.4–49.9)
HEMOGLOBIN: 14.7 g/dL (ref 13.0–17.1)
LYMPH%: 16.7 % (ref 14.0–49.0)
MCH: 30.4 pg (ref 27.2–33.4)
MCHC: 34.1 g/dL (ref 32.0–36.0)
MCV: 89.2 fL (ref 79.3–98.0)
MONO#: 0.9 10*3/uL (ref 0.1–0.9)
MONO%: 19.7 % — AB (ref 0.0–14.0)
NEUT#: 2.9 10*3/uL (ref 1.5–6.5)
NEUT%: 60.9 % (ref 39.0–75.0)
Platelets: 182 10*3/uL (ref 140–400)
RBC: 4.83 10*6/uL (ref 4.20–5.82)
RDW: 14 % (ref 11.0–14.6)
WBC: 4.7 10*3/uL (ref 4.0–10.3)
lymph#: 0.8 10*3/uL — ABNORMAL LOW (ref 0.9–3.3)

## 2015-08-15 MED ORDER — SODIUM CHLORIDE 0.9 % IV SOLN
120.0000 mg/m2 | Freq: Once | INTRAVENOUS | Status: AC
  Administered 2015-08-15: 240 mg via INTRAVENOUS
  Filled 2015-08-15: qty 12

## 2015-08-15 MED ORDER — SODIUM CHLORIDE 0.9 % IV SOLN
Freq: Once | INTRAVENOUS | Status: AC
  Administered 2015-08-15: 14:00:00 via INTRAVENOUS

## 2015-08-15 MED ORDER — SODIUM CHLORIDE 0.9 % IV SOLN
552.0000 mg | Freq: Once | INTRAVENOUS | Status: AC
  Administered 2015-08-15: 550 mg via INTRAVENOUS
  Filled 2015-08-15: qty 55

## 2015-08-15 MED ORDER — SODIUM CHLORIDE 0.9 % IV SOLN
Freq: Once | INTRAVENOUS | Status: AC
  Administered 2015-08-15: 14:00:00 via INTRAVENOUS
  Filled 2015-08-15: qty 8

## 2015-08-15 NOTE — Patient Instructions (Signed)
[facility] Discharge Instructions for Patients Receiving Chemotherapy  Today you received the following chemotherapy agents Etoposide/Carboplatin.  To help prevent nausea and vomiting after your treatment, we encourage you to take your nausea medication as prescribed.   If you develop nausea and vomiting that is not controlled by your nausea medication, call the clinic.   BELOW ARE SYMPTOMS THAT SHOULD BE REPORTED IMMEDIATELY:  *FEVER GREATER THAN 100.5 F  *CHILLS WITH OR WITHOUT FEVER  NAUSEA AND VOMITING THAT IS NOT CONTROLLED WITH YOUR NAUSEA MEDICATION  *UNUSUAL SHORTNESS OF BREATH  *UNUSUAL BRUISING OR BLEEDING  TENDERNESS IN MOUTH AND THROAT WITH OR WITHOUT PRESENCE OF ULCERS  *URINARY PROBLEMS  *BOWEL PROBLEMS  UNUSUAL RASH Items with * indicate a potential emergency and should be followed up as soon as possible.  Feel free to call the clinic you have any questions or concerns. The clinic phone number is [phone].  Please show the [place] at check-in to the Emergency Department and triage nurse.

## 2015-08-16 ENCOUNTER — Ambulatory Visit (HOSPITAL_BASED_OUTPATIENT_CLINIC_OR_DEPARTMENT_OTHER): Payer: Medicare Other

## 2015-08-16 ENCOUNTER — Telehealth: Payer: Self-pay | Admitting: Medical Oncology

## 2015-08-16 VITALS — BP 131/71 | HR 59 | Temp 97.8°F

## 2015-08-16 DIAGNOSIS — Z5111 Encounter for antineoplastic chemotherapy: Secondary | ICD-10-CM

## 2015-08-16 DIAGNOSIS — C3491 Malignant neoplasm of unspecified part of right bronchus or lung: Secondary | ICD-10-CM | POA: Diagnosis not present

## 2015-08-16 MED ORDER — SODIUM CHLORIDE 0.9 % IV SOLN
Freq: Once | INTRAVENOUS | Status: AC
  Administered 2015-08-16: 15:00:00 via INTRAVENOUS
  Filled 2015-08-16: qty 4

## 2015-08-16 MED ORDER — PROCHLORPERAZINE MALEATE 10 MG PO TABS
10.0000 mg | ORAL_TABLET | Freq: Four times a day (QID) | ORAL | Status: DC | PRN
Start: 2015-08-16 — End: 2017-06-04

## 2015-08-16 MED ORDER — SODIUM CHLORIDE 0.9 % IV SOLN
Freq: Once | INTRAVENOUS | Status: AC
  Administered 2015-08-16: 15:00:00 via INTRAVENOUS

## 2015-08-16 MED ORDER — ETOPOSIDE CHEMO INJECTION 1 GM/50ML
120.0000 mg/m2 | Freq: Once | INTRAVENOUS | Status: AC
  Administered 2015-08-16: 240 mg via INTRAVENOUS
  Filled 2015-08-16: qty 12

## 2015-08-16 NOTE — Telephone Encounter (Signed)
I left message for pt to tell chemo nurse how he did last night and let us know if he needs anything.

## 2015-08-16 NOTE — Patient Instructions (Signed)
[facility] Discharge Instructions for Patients Receiving Chemotherapy  Today you received the following chemotherapy agents: Etoposide.  To help prevent nausea and vomiting after your treatment, we encourage you to take your nausea medication: Compazine. Take one every 6 hours as needed.   If you develop nausea and vomiting that is not controlled by your nausea medication, call the clinic.   BELOW ARE SYMPTOMS THAT SHOULD BE REPORTED IMMEDIATELY:  *FEVER GREATER THAN 100.5 F  *CHILLS WITH OR WITHOUT FEVER  NAUSEA AND VOMITING THAT IS NOT CONTROLLED WITH YOUR NAUSEA MEDICATION  *UNUSUAL SHORTNESS OF BREATH  *UNUSUAL BRUISING OR BLEEDING  TENDERNESS IN MOUTH AND THROAT WITH OR WITHOUT PRESENCE OF ULCERS  *URINARY PROBLEMS  *BOWEL PROBLEMS  UNUSUAL RASH Items with * indicate a potential emergency and should be followed up as soon as possible.  Feel free to call the clinic should you have any questions or concerns. The clinic phone number is [phone].  Please show the CHEMO ALERT CARD at check-in to the Emergency Department and triage nurse.   

## 2015-08-16 NOTE — Progress Notes (Signed)
Pt reports he suffered a skin tear to L forearm during phlebotomy on 08/15/15. Pressure dressing intact. Assessed site, approximate 85m well approximated skin tear noted. Clean and without drainage. Pt reports "it wouldn't stop bleeding" this morning. Noted numerous bruises to bilateral forearms. Pt relates this to his Plavix.

## 2015-08-17 ENCOUNTER — Telehealth: Payer: Self-pay | Admitting: Medical Oncology

## 2015-08-17 ENCOUNTER — Ambulatory Visit (HOSPITAL_BASED_OUTPATIENT_CLINIC_OR_DEPARTMENT_OTHER): Payer: Medicare Other

## 2015-08-17 VITALS — BP 122/55 | HR 57 | Temp 98.3°F | Resp 18

## 2015-08-17 DIAGNOSIS — C3491 Malignant neoplasm of unspecified part of right bronchus or lung: Secondary | ICD-10-CM | POA: Diagnosis not present

## 2015-08-17 DIAGNOSIS — Z5111 Encounter for antineoplastic chemotherapy: Secondary | ICD-10-CM

## 2015-08-17 MED ORDER — SODIUM CHLORIDE 0.9 % IV SOLN
Freq: Once | INTRAVENOUS | Status: AC
  Administered 2015-08-17: 15:00:00 via INTRAVENOUS
  Filled 2015-08-17: qty 4

## 2015-08-17 MED ORDER — HEPARIN SOD (PORK) LOCK FLUSH 100 UNIT/ML IV SOLN
500.0000 [IU] | Freq: Once | INTRAVENOUS | Status: DC | PRN
  Filled 2015-08-17: qty 5

## 2015-08-17 MED ORDER — SODIUM CHLORIDE 0.9 % IV SOLN
120.0000 mg/m2 | Freq: Once | INTRAVENOUS | Status: AC
  Administered 2015-08-17: 240 mg via INTRAVENOUS
  Filled 2015-08-17: qty 12

## 2015-08-17 MED ORDER — SODIUM CHLORIDE 0.9 % IV SOLN
Freq: Once | INTRAVENOUS | Status: AC
  Administered 2015-08-17: 15:00:00 via INTRAVENOUS

## 2015-08-17 MED ORDER — SODIUM CHLORIDE 0.9 % IJ SOLN
10.0000 mL | INTRAMUSCULAR | Status: DC | PRN
  Filled 2015-08-17: qty 10

## 2015-08-17 NOTE — Telephone Encounter (Signed)
error 

## 2015-08-17 NOTE — Patient Instructions (Signed)
[facility] Discharge Instructions for Patients Receiving Chemotherapy  Today you received the following chemotherapy agents: Etoposide   To help prevent nausea and vomiting after your treatment, we encourage you to take your nausea medication as directed.    If you develop nausea and vomiting that is not controlled by your nausea medication, call the clinic.   BELOW ARE SYMPTOMS THAT SHOULD BE REPORTED IMMEDIATELY:  *FEVER GREATER THAN 100.5 F  *CHILLS WITH OR WITHOUT FEVER  NAUSEA AND VOMITING THAT IS NOT CONTROLLED WITH YOUR NAUSEA MEDICATION  *UNUSUAL SHORTNESS OF BREATH  *UNUSUAL BRUISING OR BLEEDING  TENDERNESS IN MOUTH AND THROAT WITH OR WITHOUT PRESENCE OF ULCERS  *URINARY PROBLEMS  *BOWEL PROBLEMS  UNUSUAL RASH Items with * indicate a potential emergency and should be followed up as soon as possible.  Feel free to call the clinic you have any questions or concerns. The clinic phone number is [phone].  Please show the CHEMO ALERT CARD at check-in to the Emergency Department and triage nurse.   

## 2015-08-19 ENCOUNTER — Ambulatory Visit (HOSPITAL_BASED_OUTPATIENT_CLINIC_OR_DEPARTMENT_OTHER): Payer: Medicare Other

## 2015-08-19 VITALS — BP 150/76 | HR 65 | Temp 97.7°F | Resp 22

## 2015-08-19 DIAGNOSIS — C3491 Malignant neoplasm of unspecified part of right bronchus or lung: Secondary | ICD-10-CM

## 2015-08-19 DIAGNOSIS — Z5189 Encounter for other specified aftercare: Secondary | ICD-10-CM

## 2015-08-19 MED ORDER — PEGFILGRASTIM INJECTION 6 MG/0.6ML ~~LOC~~
6.0000 mg | PREFILLED_SYRINGE | Freq: Once | SUBCUTANEOUS | Status: AC
  Administered 2015-08-19: 6 mg via SUBCUTANEOUS
  Filled 2015-08-19: qty 0.6

## 2015-08-19 NOTE — Patient Instructions (Signed)
Pegfilgrastim injection What is this medicine? PEGFILGRASTIM (PEG fil gra stim) is a long-acting granulocyte colony-stimulating factor that stimulates the growth of neutrophils, a type of white blood cell important in the body's fight against infection. It is used to reduce the incidence of fever and infection in patients with certain types of cancer who are receiving chemotherapy that affects the bone marrow, and to increase survival after being exposed to high doses of radiation. This medicine may be used for other purposes; ask your health care provider or pharmacist if you have questions. What should I tell my health care provider before I take this medicine? They need to know if you have any of these conditions: -kidney disease -latex allergy -ongoing radiation therapy -sickle cell disease -skin reactions to acrylic adhesives (On-Body Injector only) -an unusual or allergic reaction to pegfilgrastim, filgrastim, other medicines, foods, dyes, or preservatives -pregnant or trying to get pregnant -breast-feeding How should I use this medicine? This medicine is for injection under the skin. If you get this medicine at home, you will be taught how to prepare and give the pre-filled syringe or how to use the On-body Injector. Refer to the patient Instructions for Use for detailed instructions. Use exactly as directed. Take your medicine at regular intervals. Do not take your medicine more often than directed. It is important that you put your used needles and syringes in a special sharps container. Do not put them in a trash can. If you do not have a sharps container, call your pharmacist or healthcare provider to get one. Talk to your pediatrician regarding the use of this medicine in children. While this drug may be prescribed for selected conditions, precautions do apply. Overdosage: If you think you have taken too much of this medicine contact a poison control center or emergency room at  once. NOTE: This medicine is only for you. Do not share this medicine with others. What if I miss a dose? It is important not to miss your dose. Call your doctor or health care professional if you miss your dose. If you miss a dose due to an On-body Injector failure or leakage, a new dose should be administered as soon as possible using a single prefilled syringe for manual use. What may interact with this medicine? Interactions have not been studied. Give your health care provider a list of all the medicines, herbs, non-prescription drugs, or dietary supplements you use. Also tell them if you smoke, drink alcohol, or use illegal drugs. Some items may interact with your medicine. This list may not describe all possible interactions. Give your health care provider a list of all the medicines, herbs, non-prescription drugs, or dietary supplements you use. Also tell them if you smoke, drink alcohol, or use illegal drugs. Some items may interact with your medicine. What should I watch for while using this medicine? You may need blood work done while you are taking this medicine. If you are going to need a MRI, CT scan, or other procedure, tell your doctor that you are using this medicine (On-Body Injector only). What side effects may I notice from receiving this medicine? Side effects that you should report to your doctor or health care professional as soon as possible: -allergic reactions like skin rash, itching or hives, swelling of the face, lips, or tongue -dizziness -fever -pain, redness, or irritation at site where injected -pinpoint red spots on the skin -red or dark-brown urine -shortness of breath or breathing problems -stomach or side pain, or pain   at the shoulder -swelling -tiredness -trouble passing urine or change in the amount of urine Side effects that usually do not require medical attention (report to your doctor or health care professional if they continue or are  bothersome): -bone pain -muscle pain This list may not describe all possible side effects. Call your doctor for medical advice about side effects. You may report side effects to FDA at 1-800-FDA-1088. Where should I keep my medicine? Keep out of the reach of children. Store pre-filled syringes in a refrigerator between 2 and 8 degrees C (36 and 46 degrees F). Do not freeze. Keep in carton to protect from light. Throw away this medicine if it is left out of the refrigerator for more than 48 hours. Throw away any unused medicine after the expiration date. NOTE: This sheet is a summary. It may not cover all possible information. If you have questions about this medicine, talk to your doctor, pharmacist, or health care provider.    2016, Elsevier/Gold Standard. (2014-11-18 14:30:14)  

## 2015-08-22 ENCOUNTER — Encounter: Payer: Self-pay | Admitting: Internal Medicine

## 2015-08-22 ENCOUNTER — Telehealth: Payer: Self-pay | Admitting: Internal Medicine

## 2015-08-22 ENCOUNTER — Ambulatory Visit (HOSPITAL_BASED_OUTPATIENT_CLINIC_OR_DEPARTMENT_OTHER): Payer: Medicare Other | Admitting: Internal Medicine

## 2015-08-22 ENCOUNTER — Telehealth: Payer: Self-pay | Admitting: *Deleted

## 2015-08-22 VITALS — BP 134/61 | HR 67 | Temp 98.4°F | Resp 18 | Ht 65.0 in | Wt 190.9 lb

## 2015-08-22 DIAGNOSIS — R21 Rash and other nonspecific skin eruption: Secondary | ICD-10-CM | POA: Insufficient documentation

## 2015-08-22 DIAGNOSIS — C3491 Malignant neoplasm of unspecified part of right bronchus or lung: Secondary | ICD-10-CM | POA: Diagnosis not present

## 2015-08-22 DIAGNOSIS — C3490 Malignant neoplasm of unspecified part of unspecified bronchus or lung: Secondary | ICD-10-CM

## 2015-08-22 HISTORY — DX: Rash and other nonspecific skin eruption: R21

## 2015-08-22 NOTE — Telephone Encounter (Signed)
Gave and printed appt sched and avs fo rpt for OCT NOV and DEC.Marland KitchenMarland Kitchen

## 2015-08-22 NOTE — Telephone Encounter (Signed)
-----   Message from Curt Bears, MD sent at 08/22/2015  4:03 PM EDT ----- Please ask Mr. Foutz to discontinue Plavix. ----- Message -----    From: Lorretta Harp, MD    Sent: 08/22/2015   3:43 PM      To: Curt Bears, MD  Mohamed, it's fine with me to discontinue plavix at this point.  Roderic Palau ----- Message -----    From: Curt Bears, MD    Sent: 08/22/2015  12:20 PM      To: Lorretta Harp, MD  Hi Dr. Gwenlyn Found. Mr. Zavadil was recently diagnosed with small cell lung cancer and undergoing chemotherapy. He is on Plavix and ASA and I did not mind for him to continue on these medications but he has a lot of skin lesions that bleeds easily for days. He wonders if it would be ok to discontinue Plavix. Please advise. Dr. Julien Nordmann

## 2015-08-22 NOTE — Telephone Encounter (Signed)
Per MD, called notified pt to stop Plavix. Pt verbalized he usually takes it at night so he will stop medication starting tonight. No further concerns at this time.

## 2015-08-22 NOTE — Progress Notes (Signed)
[place] [phone]   Fax:([phone]) [phone]  OFFICE PROGRESS NOTE  [name], [address] [place] [address]  DIAGNOSIS: Extensive stage (TX, N2, M1b) small cell lung cancer in September 2016 presenting with right hilar and mediastinal lymphadenopathy as well as highly suspicious metastatic disease in the axilla bilaterally.   PRIOR THERAPY: None  CURRENT THERAPY: Systemic chemotherapy with carboplatin for AUC of 5 on day 1 and etoposide 120 MG/M2 on days one 2, 3 with Neulasta support on day 4. Status post one cycle.  INTERVAL HISTORY: Andrew Shaw 69 y.o. male returns to the clinic today for follow-up visit accompanied by his wife and daughter. The patient tolerated the first week of his treatment fairly well with no significant adverse effects except for mild nausea on day 3 of his treatment. He continues to have significant skin rash and erythema of the upper extremity and hands. This has been going on for a few months now. He was seen by Dr. Rosalita Chessman and referred to another dermatologist at [facility] for evaluation of this condition but his appointment is scheduled for late October 2016. The patient also has some bleeding issues from the skin rash that sometimes lasts for days. He is currently on aspirin and Plavix and he is wondering if he can discontinue Plavix at this point. He denied having any significant fever or chills. He has no nausea or vomiting. The patient denied having any significant chest pain, shortness of breath, cough or hemoptysis. His recent MRI of the brain showed no evidence for metastatic disease to the brain. He is here today for evaluation and repeat blood work.  MEDICAL HISTORY: Past Medical History  Diagnosis Date  . Coronary artery disease     2D ECHO, 10/31/2010 - EF >55%, normal; NUCLEAR STRESS TEST, 10/23/2010 - perfusion defect in inferior myocardial region, post-stress EF 62%, EKG  negative for ischemia  . Hypertension   . Hyperlipemia   . Chronic back pain   . DVT (deep venous thrombosis) ([place])     history 2004 after knee surg  . BPH (benign prostatic hyperplasia)   . Arthritis   . Neuromuscular disorder ([place])   . Carpal tunnel syndrome   . 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 GERD (gastroesophageal reflux disease)   . Myocardial infarction ([place]) 2005    from steroids  . S/P angioplasty with stent, BMS to LCX  12/23/12 12/23/2012  . Back pain 12/23/2012  . Shortness of breath     with exertion  . COPD (chronic obstructive pulmonary disease) ([place])   . Carpal tunnel syndrome, right     ALLERGIES:  is allergic to lisinopril; other; antihistamines, chlorpheniramine-type; cortisone; flexeril; and latex.  MEDICATIONS:  Current Outpatient Prescriptions  Medication Sig Dispense Refill  . aspirin EC 81 MG tablet Take 81 mg by mouth daily.    . clopidogrel (PLAVIX) 75 MG tablet TAKE 1 TABLET BY MOUTH EVERY DAY (Patient taking differently: TAKE 75 MG BY MOUTH EVERY DAY) 30 tablet 4  . fenofibrate (TRICOR) 48 MG tablet Take 48 mg by mouth daily.    . fluticasone (FLONASE) 50 MCG/ACT nasal spray Place 2 sprays into both nostrils daily. (Patient taking differently: Place 2 sprays into both nostrils daily as needed for allergies. ) 16 g 2  . HYDROcodone-acetaminophen (NORCO) 10-325 MG per tablet Take 1 tablet by mouth every 4 (four) hours as needed (breakthrough  pain).     . irbesartan (AVAPRO) 150 MG tablet Take 1 tablet (150 mg total) by mouth daily. 30 tablet 6  . metoprolol (LOPRESSOR) 50 MG tablet TAKE 1 TABLET BY MOUTH TWICE DAILY (Patient taking differently: TAKE 50 MG BY MOUTH TWICE DAILY) 60 tablet 9  . mometasone (ELOCON) 0.1 % cream Apply 1 application topically daily as needed (for rash).   3  . neomycin-bacitracin-polymyxin (NEOSPORIN) ointment Apply 1 application topically daily.    . nitroGLYCERIN (NITROSTAT) 0.4 MG SL tablet Place 1 tablet (0.4 mg  total) under the tongue every 5 (five) minutes x 3 doses as needed for chest pain. 25 tablet 2  . OXYCONTIN 20 MG T12A 12 hr tablet Take 20 mg by mouth every 8 (eight) hours.  0  . prochlorperazine (COMPAZINE) 10 MG tablet Take 1 tablet (10 mg total) by mouth every 6 (six) hours as needed for nausea or vomiting. 30 tablet 1  . rosuvastatin (CRESTOR) 20 MG tablet Take 20 mg by mouth daily.    Marland Kitchen tiotropium (SPIRIVA) 18 MCG inhalation capsule Place 1 capsule (18 mcg total) into inhaler and inhale daily. 30 capsule 6   No current facility-administered medications for this visit.    SURGICAL HISTORY:  Past Surgical History  Procedure Laterality Date  . Knee arthroscopy  04,06    left  . Joint replacement Left 2008    lt total knee  . Manipulation knee joint Left 2009    closed lt knee   . Hernia repair  2008    umb   . Cervical fusion  1999  . Back surgery  2004    lumb fusion  . Epidural block injection      multiple lumbar  . Carpal tunnel release  04/15/2012    Procedure: CARPAL TUNNEL RELEASE;  Surgeon: Wynonia Sours, MD;  Location: [facility];  Service: Orthopedics;  Laterality: Left;  . Trigger finger release  04/15/2012    Procedure: RELEASE TRIGGER FINGER/A-1 PULLEY;  Surgeon: Wynonia Sours, MD;  Location: [facility];  Service: Orthopedics;  Laterality: Left;  left thumb and little finger  . Cardiac catheterization  12/23/2012    Mid nondominant AV groove circumflex stented with a 2.5x69m Mini Vision stent resulting in a reduction of 90% stenosis to 0% residual  . Cardiac catheterization  02/07/2004    Noncritical CAD, continue medical therapy  . Cardiac catheterization  02/03/1999    Recommended medical therapy  . Left heart catheterization with coronary angiogram N/A 12/23/2012    Procedure: LEFT HEART CATHETERIZATION WITH CORONARY ANGIOGRAM;  Surgeon: JLorretta Harp MD;  [facility];  Service: Cardiovascular;  Laterality: N/A;  .  Percutaneous coronary stent intervention (pci-s)  12/23/2012    Procedure: PERCUTANEOUS CORONARY STENT INTERVENTION (PCI-S);  Surgeon: JLorretta Harp MD;  [facility];  Service: Cardiovascular;;  . Left heart catheterization with coronary angiogram N/A 01/11/2015    Procedure: LEFT HEART CATHETERIZATION WITH CORONARY ANGIOGRAM;  Surgeon: Peter M JMartinique MD;  [facility];  Service: Cardiovascular;  Laterality: N/A;  . Colonoscopy w/ polypectomy    . Video bronchoscopy with endobronchial ultrasound N/A 07/28/2015    Procedure: VIDEO BRONCHOSCOPY WITH ENDOBRONCHIAL ULTRASOUND;  Surgeon: SMelrose Nakayama MD;  [place]  Service: Thoracic;  Laterality: N/A;    REVIEW OF SYSTEMS:  A comprehensive review of systems was negative except for: Constitutional: positive for fatigue Integument/breast: positive for dryness, rash and skin  lesion(s)   PHYSICAL EXAMINATION: General appearance: alert, cooperative, fatigued and no distress Head: Normocephalic, without obvious abnormality, atraumatic Neck: no adenopathy, no JVD, supple, symmetrical, trachea midline and thyroid not enlarged, symmetric, no tenderness/mass/nodules Lymph nodes: Cervical, supraclavicular, and axillary nodes normal. Resp: clear to auscultation bilaterally Back: symmetric, no curvature. ROM normal. No CVA tenderness. Cardio: regular rate and rhythm, S1, S2 normal, no murmur, click, rub or gallop GI: soft, non-tender; bowel sounds normal; no masses,  no organomegaly Extremities: extremities normal, atraumatic, no cyanosis or edema  Skin exam showed multiple macular skin rash on the hands and arms.  ECOG PERFORMANCE STATUS: 1 - Symptomatic but completely ambulatory  Blood pressure 134/61, pulse 67, temperature 98.4 F (36.9 C), temperature source Oral, resp. rate 18, height '5\' 5"'$  (1.651 m), weight 190 lb 14.4 oz (86.592 kg), SpO2 98 %.  LABORATORY DATA: Lab Results  Component Value Date   WBC 4.7  08/15/2015   HGB 14.7 08/15/2015   HCT 43.1 08/15/2015   MCV 89.2 08/15/2015   PLT 182 08/15/2015      Chemistry      Component Value Date/Time   NA 127* 08/15/2015 1245   NA 126* 07/26/2015 1451   K 4.3 08/15/2015 1245   K 4.5 07/26/2015 1451   CL 96* 07/26/2015 1451   CO2 20* 08/15/2015 1245   CO2 23 07/26/2015 1451   BUN 8.0 08/15/2015 1245   BUN 7 07/26/2015 1451   CREATININE 1.1 08/15/2015 1245   CREATININE 1.09 07/26/2015 1451      Component Value Date/Time   CALCIUM 9.5 08/15/2015 1245   CALCIUM 9.0 07/26/2015 1451   ALKPHOS 79 08/15/2015 1245   ALKPHOS 68 07/26/2015 1451   AST 22 08/15/2015 1245   AST 23 07/26/2015 1451   ALT 19 08/15/2015 1245   ALT 19 07/26/2015 1451   BILITOT 0.60 08/15/2015 1245   BILITOT 0.7 07/26/2015 1451       RADIOGRAPHIC STUDIES: Dg Chest 2 View  07/26/2015   CLINICAL DATA:  Mediastinal and hilar adenopathy.  EXAM: CHEST  2 VIEW  COMPARISON:  CT 06/21/2015. Chest x-ray 01/08/2015, 12/22/2012, 11/13/2010.  FINDINGS: Mediastinum appears stable. No mediastinal widening noted. Mild right hilar fullness again noted consistent with adenopathy. No interim change. Mild chronic interstitial changes are noted throughout both lungs. Basal pleural-parenchymal thickening consistent with scarring. No acute infiltrate No pleural effusion or pneumothorax. No acute bony abnormality. Degenerative changes thoracic spine. Prior cervical spine fusion.  IMPRESSION: 1. Stable mild right hilar fullness consistent with stable adenopathy. No interim change from prior exams. 2. Mild stable interstitial prominence noted bilaterally consistent chronic interstitial lung disease. Basal pleural parenchymal thickening noted consistent with scarring. No acute pulmonary disease noted.   Electronically Signed   By: Marcello Moores  Register   On: 07/26/2015 15:46   Mr [name] Wo Contrast  08/08/2015   CLINICAL DATA:  Squamous cell carcinoma of the right lung. Headaches. Evaluation for  brain metastases.  EXAM: MRI HEAD WITHOUT AND WITH CONTRAST  TECHNIQUE: Multiplanar, multiecho pulse sequences of the brain and surrounding structures were obtained without and with intravenous contrast.  CONTRAST:  3m MULTIHANCE GADOBENATE DIMEGLUMINE 529 MG/ML IV SOLN  COMPARISON:  Head CT 12/31/2011  FINDINGS: There is no evidence of acute infarct, intracranial hemorrhage, mass, midline shift, or extra-axial fluid collection. There is mild generalized cerebral atrophy, within normal limits for age. Patchy T2 hyperintensities throughout the subcortical and deep cerebral white matter bilaterally are nonspecific but compatible with moderate chronic  small vessel ischemic disease. No abnormal enhancement is identified.  Orbits are unremarkable. Paranasal sinuses and mastoid air cells are clear. Major intracranial vascular flow voids are preserved.  IMPRESSION: 1. No evidence of acute abnormality or intracranial metastases. 2. Moderate chronic small vessel ischemic disease.   Electronically Signed   By: Logan Bores M.D.   On: 08/08/2015 09:54   Nm Pet Image Initial (pi) Skull Base To Thigh  08/03/2015   CLINICAL DATA:  Initial treatment strategy for squamous cell lung cancer.  EXAM: NUCLEAR MEDICINE PET SKULL BASE TO THIGH  TECHNIQUE: 9.5 mCi F-18 FDG was injected intravenously. Full-ring PET imaging was performed from the skull base to thigh after the radiotracer. CT data was obtained and used for attenuation correction and anatomic localization.  FASTING BLOOD GLUCOSE:  Value: 103 mg/dl  COMPARISON:  High-resolution CT chest dated 06/21/2015  FINDINGS: NECK  No hypermetabolic lymph nodes in the neck.  Degenerative uptake involving the cervical spine.  CHEST  Dominant right hilar node measuring approximately 2.1 cm short axis, max SUV 13.7. 2.3 cm short axis subcarinal node, max SUV 10.2. Small right paratracheal and prevascular nodes measuring 709 mm short axis, max SUV 4.0. Small bilateral axillary lymph nodes  measuring measuring up to 8 mm short axis (series 4/ image 58), max SUV 3.8.  Lungs are essentially clear. No suspicious pulmonary nodules. Mild centrilobular and paraseptal emphysematous changes. No pleural effusion or pneumothorax.  ABDOMEN/PELVIS  No abnormal hypermetabolic activity within the liver, pancreas, adrenal glands, or spleen.  Atherosclerotic calcifications of the abdominal aorta and branch vessels. Bladder is mildly thick-walled although underdistended. Fat within the bilateral inguinal canals.  No hypermetabolic lymph nodes in the abdomen or pelvis.  SKELETON  No focal hypermetabolic activity to suggest skeletal metastasis.  IMPRESSION: Dominant hypermetabolic right hilar and subcarinal nodes, corresponding to known primary bronchogenic neoplasm.  Additional mediastinal and suspected bilateral axillary nodal metastases.   Electronically Signed   By: Julian Hy M.D.   On: 08/03/2015 17:47    ASSESSMENT AND PLAN: This is a very pleasant 69 years old white male recently diagnosed with extensive stage small cell lung cancer currently undergoing systemic chemotherapy with carboplatin and etoposide status post 1 week of treatment and he tolerated the first week fairly well. His blood work today is unremarkable. I recommended for the patient to continue his treatment as scheduled and he is expected to start cycle #2 in 2 weeks. For the skin rash. He is followed by his dermatologist and biopsy was performed recently. He is also scheduled to see another dermatologist at [facility] for second opinion. I will see him back for follow-up visit in 2 weeks for reevaluation before starting cycle #2. The patient was advised to call immediately if he has any concerning symptoms in the interval. The patient voices understanding of current disease status and treatment options and is in agreement with the current care plan.  All questions were answered. The patient knows to call the clinic  with any problems, questions or concerns. We can certainly see the patient much sooner if necessary.  Disclaimer: This note was dictated with voice recognition software. Similar sounding words can inadvertently be transcribed and may not be corrected upon review.

## 2015-08-25 ENCOUNTER — Encounter: Payer: Self-pay | Admitting: Internal Medicine

## 2015-08-25 NOTE — Progress Notes (Signed)
I placed fmla form for wife sarah on desk of nurse for dr Mckinley Jewel

## 2015-08-26 ENCOUNTER — Other Ambulatory Visit: Payer: Self-pay | Admitting: Medical Oncology

## 2015-08-26 ENCOUNTER — Telehealth: Payer: Self-pay | Admitting: Medical Oncology

## 2015-08-26 DIAGNOSIS — C3490 Malignant neoplasm of unspecified part of unspecified bronchus or lung: Secondary | ICD-10-CM

## 2015-08-26 NOTE — Telephone Encounter (Signed)
II mailed FMLA form

## 2015-08-29 ENCOUNTER — Other Ambulatory Visit (HOSPITAL_BASED_OUTPATIENT_CLINIC_OR_DEPARTMENT_OTHER): Payer: Medicare Other

## 2015-08-29 DIAGNOSIS — C3490 Malignant neoplasm of unspecified part of unspecified bronchus or lung: Secondary | ICD-10-CM | POA: Diagnosis present

## 2015-08-29 DIAGNOSIS — C44622 Squamous cell carcinoma of skin of right upper limb, including shoulder: Secondary | ICD-10-CM | POA: Diagnosis not present

## 2015-08-29 DIAGNOSIS — C44629 Squamous cell carcinoma of skin of left upper limb, including shoulder: Secondary | ICD-10-CM | POA: Diagnosis not present

## 2015-08-29 DIAGNOSIS — L309 Dermatitis, unspecified: Secondary | ICD-10-CM | POA: Diagnosis not present

## 2015-08-29 DIAGNOSIS — Z85828 Personal history of other malignant neoplasm of skin: Secondary | ICD-10-CM | POA: Diagnosis not present

## 2015-08-29 LAB — CBC WITH DIFFERENTIAL/PLATELET
BASO%: 0.3 % (ref 0.0–2.0)
Basophils Absolute: 0 10*3/uL (ref 0.0–0.1)
EOS ABS: 0 10*3/uL (ref 0.0–0.5)
EOS%: 0.8 % (ref 0.0–7.0)
HEMATOCRIT: 33.1 % — AB (ref 38.4–49.9)
HGB: 11.4 g/dL — ABNORMAL LOW (ref 13.0–17.1)
LYMPH#: 0.9 10*3/uL (ref 0.9–3.3)
LYMPH%: 22.2 % (ref 14.0–49.0)
MCH: 30.6 pg (ref 27.2–33.4)
MCHC: 34.4 g/dL (ref 32.0–36.0)
MCV: 88.7 fL (ref 79.3–98.0)
MONO#: 0.8 10*3/uL (ref 0.1–0.9)
MONO%: 20.7 % — ABNORMAL HIGH (ref 0.0–14.0)
NEUT%: 56 % (ref 39.0–75.0)
NEUTROS ABS: 2.2 10*3/uL (ref 1.5–6.5)
RBC: 3.73 10*6/uL — ABNORMAL LOW (ref 4.20–5.82)
RDW: 13.1 % (ref 11.0–14.6)
WBC: 4 10*3/uL (ref 4.0–10.3)

## 2015-08-29 LAB — COMPREHENSIVE METABOLIC PANEL (CC13)
ALBUMIN: 3.2 g/dL — AB (ref 3.5–5.0)
ALK PHOS: 80 U/L (ref 40–150)
ALT: 26 U/L (ref 0–55)
ANION GAP: 8 meq/L (ref 3–11)
AST: 19 U/L (ref 5–34)
BILIRUBIN TOTAL: 0.3 mg/dL (ref 0.20–1.20)
BUN: 6.3 mg/dL — ABNORMAL LOW (ref 7.0–26.0)
CALCIUM: 9.2 mg/dL (ref 8.4–10.4)
CO2: 22 mEq/L (ref 22–29)
CREATININE: 1 mg/dL (ref 0.7–1.3)
Chloride: 103 mEq/L (ref 98–109)
EGFR: 73 mL/min/{1.73_m2} — ABNORMAL LOW (ref >90)
Glucose: 110 mg/dl (ref 70–140)
Potassium: 4.5 mEq/L (ref 3.5–5.1)
Sodium: 133 mEq/L — ABNORMAL LOW (ref 136–145)
Total Protein: 6.7 g/dL (ref 6.4–8.3)

## 2015-08-31 DIAGNOSIS — M47812 Spondylosis without myelopathy or radiculopathy, cervical region: Secondary | ICD-10-CM | POA: Diagnosis not present

## 2015-08-31 DIAGNOSIS — Z79891 Long term (current) use of opiate analgesic: Secondary | ICD-10-CM | POA: Diagnosis not present

## 2015-08-31 DIAGNOSIS — G894 Chronic pain syndrome: Secondary | ICD-10-CM | POA: Diagnosis not present

## 2015-08-31 DIAGNOSIS — M4726 Other spondylosis with radiculopathy, lumbar region: Secondary | ICD-10-CM | POA: Diagnosis not present

## 2015-09-05 ENCOUNTER — Other Ambulatory Visit: Payer: Self-pay | Admitting: *Deleted

## 2015-09-05 ENCOUNTER — Other Ambulatory Visit (HOSPITAL_BASED_OUTPATIENT_CLINIC_OR_DEPARTMENT_OTHER): Payer: Medicare Other

## 2015-09-05 ENCOUNTER — Encounter: Payer: Self-pay | Admitting: Oncology

## 2015-09-05 ENCOUNTER — Ambulatory Visit (HOSPITAL_BASED_OUTPATIENT_CLINIC_OR_DEPARTMENT_OTHER): Payer: Medicare Other | Admitting: Oncology

## 2015-09-05 ENCOUNTER — Ambulatory Visit (HOSPITAL_BASED_OUTPATIENT_CLINIC_OR_DEPARTMENT_OTHER): Payer: Medicare Other

## 2015-09-05 VITALS — BP 142/67 | HR 82 | Temp 97.6°F | Resp 17 | Ht 65.0 in | Wt 184.7 lb

## 2015-09-05 VITALS — BP 145/60 | HR 86 | Temp 97.6°F | Resp 16

## 2015-09-05 DIAGNOSIS — Z5111 Encounter for antineoplastic chemotherapy: Secondary | ICD-10-CM

## 2015-09-05 DIAGNOSIS — B379 Candidiasis, unspecified: Secondary | ICD-10-CM

## 2015-09-05 DIAGNOSIS — C3491 Malignant neoplasm of unspecified part of right bronchus or lung: Secondary | ICD-10-CM

## 2015-09-05 DIAGNOSIS — C3490 Malignant neoplasm of unspecified part of unspecified bronchus or lung: Secondary | ICD-10-CM

## 2015-09-05 LAB — CBC WITH DIFFERENTIAL/PLATELET
BASO%: 0.7 % (ref 0.0–2.0)
BASOS ABS: 0 10*3/uL (ref 0.0–0.1)
EOS ABS: 0 10*3/uL (ref 0.0–0.5)
EOS%: 0.7 % (ref 0.0–7.0)
HCT: 36 % — ABNORMAL LOW (ref 38.4–49.9)
HGB: 12.3 g/dL — ABNORMAL LOW (ref 13.0–17.1)
LYMPH%: 17.1 % (ref 14.0–49.0)
MCH: 30.9 pg (ref 27.2–33.4)
MCHC: 34.2 g/dL (ref 32.0–36.0)
MCV: 90.4 fL (ref 79.3–98.0)
MONO#: 1.3 10*3/uL — ABNORMAL HIGH (ref 0.1–0.9)
MONO%: 21.9 % — AB (ref 0.0–14.0)
NEUT#: 3.5 10*3/uL (ref 1.5–6.5)
NEUT%: 59.6 % (ref 39.0–75.0)
Platelets: 295 10*3/uL (ref 140–400)
RBC: 3.98 10*6/uL — AB (ref 4.20–5.82)
RDW: 13.7 % (ref 11.0–14.6)
WBC: 5.9 10*3/uL (ref 4.0–10.3)
lymph#: 1 10*3/uL (ref 0.9–3.3)

## 2015-09-05 LAB — COMPREHENSIVE METABOLIC PANEL (CC13)
ALK PHOS: 79 U/L (ref 40–150)
ALT: 22 U/L (ref 0–55)
AST: 30 U/L (ref 5–34)
Albumin: 3.3 g/dL — ABNORMAL LOW (ref 3.5–5.0)
Anion Gap: 8 mEq/L (ref 3–11)
BUN: 7.8 mg/dL (ref 7.0–26.0)
CO2: 23 meq/L (ref 22–29)
Calcium: 9.4 mg/dL (ref 8.4–10.4)
Chloride: 104 mEq/L (ref 98–109)
Creatinine: 1.1 mg/dL (ref 0.7–1.3)
EGFR: 69 mL/min/{1.73_m2} — ABNORMAL LOW (ref >90)
GLUCOSE: 104 mg/dL (ref 70–140)
POTASSIUM: 4 meq/L (ref 3.5–5.1)
SODIUM: 135 meq/L — AB (ref 136–145)
Total Bilirubin: <0.3 mg/dL (ref 0.20–1.20)
Total Protein: 7 g/dL (ref 6.4–8.3)

## 2015-09-05 MED ORDER — SODIUM CHLORIDE 0.9 % IV SOLN
Freq: Once | INTRAVENOUS | Status: AC
  Administered 2015-09-05: 15:00:00 via INTRAVENOUS
  Filled 2015-09-05: qty 8

## 2015-09-05 MED ORDER — FLUCONAZOLE 100 MG PO TABS: 100.0000 mg | ORAL_TABLET | Freq: Every day | ORAL | Status: DC

## 2015-09-05 MED ORDER — SODIUM CHLORIDE 0.9 % IV SOLN
513.0000 mg | Freq: Once | INTRAVENOUS | Status: AC
  Administered 2015-09-05: 510 mg via INTRAVENOUS
  Filled 2015-09-05: qty 51

## 2015-09-05 MED ORDER — SODIUM CHLORIDE 0.9 % IV SOLN
120.0000 mg/m2 | Freq: Once | INTRAVENOUS | Status: AC
  Administered 2015-09-05: 240 mg via INTRAVENOUS
  Filled 2015-09-05: qty 12

## 2015-09-05 MED ORDER — SODIUM CHLORIDE 0.9 % IV SOLN
Freq: Once | INTRAVENOUS | Status: AC
  Administered 2015-09-05: 15:00:00 via INTRAVENOUS

## 2015-09-05 NOTE — Progress Notes (Signed)
[facility] [phone]   [phone]  OFFICE PROGRESS NOTE  [name], [place] [address] [place] [address]  DIAGNOSIS: Extensive stage (TX, N2, M1b) small cell lung cancer in September 2016 presenting with right hilar and mediastinal lymphadenopathy as well as highly suspicious metastatic disease in the axilla bilaterally.   PRIOR THERAPY: None  CURRENT THERAPY: Systemic chemotherapy with carboplatin for AUC of 5 on day 1 and etoposide 120 MG/M2 on days one 2, 3 with Neulasta support on day 4. Status post one cycle.  INTERVAL HISTORY: Andrew Shaw 69 y.o. male returns to the clinic today for follow-up visit accompanied by his daughter. The patient tolerated the first cycle of his treatment fairly well with no significant adverse effects except for mild nausea on day 3 of his treatment. He developed a skin rash and erythema of the upper extremity and hands which improved after stopping Plavix. Reports a sore throat with eating. He denied having any significant fever or chills. He has no nausea or vomiting. The patient denied having any significant chest pain, shortness of breath, cough or hemoptysis. He is here today for evaluation prior to cycle 2.  MEDICAL HISTORY: Past Medical History  Diagnosis Date  . Coronary artery disease     2D ECHO, 10/31/2010 - EF >55%, normal; NUCLEAR STRESS TEST, 10/23/2010 - perfusion defect in inferior myocardial region, post-stress EF 62%, EKG negative for ischemia  . Hypertension   . Hyperlipemia   . Chronic back pain   . DVT (deep venous thrombosis) ([place])     history 2004 after knee surg  . BPH (benign prostatic hyperplasia)   . Arthritis   . Neuromuscular disorder ([place])   . Carpal tunnel syndrome   . 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 GERD (gastroesophageal reflux disease)   . Myocardial infarction ([facility]) 2005    from steroids  . S/P angioplasty with stent, BMS to LCX   12/23/12 12/23/2012  . Back pain 12/23/2012  . Shortness of breath     with exertion  . COPD (chronic obstructive pulmonary disease) ([facility])   . Carpal tunnel syndrome, right   . Skin rash 08/22/2015    ALLERGIES:  is allergic to cortisone; latex; lisinopril; other; antihistamines, chlorpheniramine-type; and flexeril.  MEDICATIONS:  Current Outpatient Prescriptions  Medication Sig Dispense Refill  . aspirin EC 81 MG tablet Take 81 mg by mouth daily.    . fenofibrate (TRICOR) 48 MG tablet Take 48 mg by mouth daily.    . fluticasone (FLONASE) 50 MCG/ACT nasal spray Place 2 sprays into both nostrils daily. (Patient taking differently: Place 2 sprays into both nostrils daily as needed for allergies. ) 16 g 2  . HYDROcodone-acetaminophen (NORCO) 10-325 MG per tablet Take 1 tablet by mouth every 4 (four) hours as needed (breakthrough pain).     . irbesartan (AVAPRO) 150 MG tablet Take 1 tablet (150 mg total) by mouth daily. 30 tablet 6  . metoprolol (LOPRESSOR) 50 MG tablet TAKE 1 TABLET BY MOUTH TWICE DAILY (Patient taking differently: TAKE 50 MG BY MOUTH TWICE DAILY) 60 tablet 9  . mometasone (ELOCON) 0.1 % cream Apply 1 application topically daily as needed (for rash).   3  . neomycin-bacitracin-polymyxin (NEOSPORIN) ointment Apply 1 application topically daily.    . nitroGLYCERIN (NITROSTAT) 0.4 MG SL tablet Place 1 tablet (0.4 mg total) under the tongue every 5 (five) minutes x 3  doses as needed for chest pain. 25 tablet 2  . OXYCONTIN 20 MG T12A 12 hr tablet Take 20 mg by mouth every 8 (eight) hours.  0  . prochlorperazine (COMPAZINE) 10 MG tablet Take 1 tablet (10 mg total) by mouth every 6 (six) hours as needed for nausea or vomiting. 30 tablet 1  . rosuvastatin (CRESTOR) 20 MG tablet Take 20 mg by mouth daily.    Marland Kitchen tiotropium (SPIRIVA) 18 MCG inhalation capsule Place 1 capsule (18 mcg total) into inhaler and inhale daily. 30 capsule 6  . fluconazole (DIFLUCAN) 100 MG tablet Take 1 tablet  (100 mg total) by mouth daily. 7 tablet 0   No current facility-administered medications for this visit.   Facility-Administered Medications Ordered in Other Visits  Medication Dose Route Frequency Provider Last Rate Last Dose  . CARBOplatin (PARAPLATIN) 510 mg in sodium chloride 0.9 % 250 mL chemo infusion  510 mg Intravenous Once Curt Bears, MD      . etoposide (VEPESID) 240 mg in sodium chloride 0.9 % 600 mL chemo infusion  120 mg/m2 (Treatment Plan Actual) Intravenous Once Curt Bears, MD 612 mL/hr at 09/05/15 1542 240 mg at 09/05/15 1542    SURGICAL HISTORY:  Past Surgical History  Procedure Laterality Date  . Knee arthroscopy  04,06    left  . Joint replacement Left 2008    lt total knee  . Manipulation knee joint Left 2009    closed lt knee   . Hernia repair  2008    umb   . Cervical fusion  1999  . Back surgery  2004    lumb fusion  . Epidural block injection      multiple lumbar  . Carpal tunnel release  04/15/2012    Procedure: CARPAL TUNNEL RELEASE;  Surgeon: Wynonia Sours, MD;  Location: [facility];  Service: Orthopedics;  Laterality: Left;  . Trigger finger release  04/15/2012    Procedure: RELEASE TRIGGER FINGER/A-1 PULLEY;  Surgeon: Wynonia Sours, MD;  Location: [facility];  Service: Orthopedics;  Laterality: Left;  left thumb and little finger  . Cardiac catheterization  12/23/2012    Mid nondominant AV groove circumflex stented with a 2.5x60m Mini Vision stent resulting in a reduction of 90% stenosis to 0% residual  . Cardiac catheterization  02/07/2004    Noncritical CAD, continue medical therapy  . Cardiac catheterization  02/03/1999    Recommended medical therapy  . Left heart catheterization with coronary angiogram N/A 12/23/2012    Procedure: LEFT HEART CATHETERIZATION WITH CORONARY ANGIOGRAM;  Surgeon: JLorretta Harp MD;  [facility];  Service: Cardiovascular;  Laterality: N/A;  . Percutaneous coronary stent  intervention (pci-s)  12/23/2012    Procedure: PERCUTANEOUS CORONARY STENT INTERVENTION (PCI-S);  Surgeon: JLorretta Harp MD;  [facility];  Service: Cardiovascular;;  . Left heart catheterization with coronary angiogram N/A 01/11/2015    Procedure: LEFT HEART CATHETERIZATION WITH CORONARY ANGIOGRAM;  Surgeon: Peter M JMartinique MD;  [facility];  Service: Cardiovascular;  Laterality: N/A;  . Colonoscopy w/ polypectomy    . Video bronchoscopy with endobronchial ultrasound N/A 07/28/2015    Procedure: VIDEO BRONCHOSCOPY WITH ENDOBRONCHIAL ULTRASOUND;  Surgeon: SMelrose Nakayama MD;  [facility] [facility]  Service: Thoracic;  Laterality: N/A;    REVIEW OF SYSTEMS:  A comprehensive review of systems was negative except for: Constitutional: positive for fatigue Integument/breast: positive for dryness, rash and skin lesion(s)   PHYSICAL  EXAMINATION: General appearance: alert, cooperative, fatigued and no distress Head: Normocephalic, without obvious abnormality, atraumatic Neck: no adenopathy, no JVD, supple, symmetrical, trachea midline and thyroid not enlarged, symmetric, no tenderness/mass/nodules Lymph nodes: Cervical, supraclavicular, and axillary nodes normal. Resp: clear to auscultation bilaterally Back: symmetric, no curvature. ROM normal. No CVA tenderness. Cardio: regular rate and rhythm, S1, S2 normal, no murmur, click, rub or gallop GI: soft, non-tender; bowel sounds normal; no masses,  no organomegaly Extremities: extremities normal, atraumatic, no cyanosis or edema  Skin exam showed multiple macular skin rash on the hands and arms; healing. Mouth: Thrush noted to tongue.  ECOG PERFORMANCE STATUS: 1 - Symptomatic but completely ambulatory  Blood pressure 142/67, pulse 82, temperature 97.6 F (36.4 C), temperature source Oral, resp. rate 17, height '5\' 5"'$  (1.651 m), weight 184 lb 11.2 oz (83.779 kg), SpO2 100 %.  LABORATORY DATA: Lab Results  Component Value Date     WBC 5.9 09/05/2015   HGB 12.3* 09/05/2015   HCT 36.0* 09/05/2015   MCV 90.4 09/05/2015   PLT 295 09/05/2015      Chemistry      Component Value Date/Time   NA 135* 09/05/2015 1336   NA 126* 07/26/2015 1451   K 4.0 09/05/2015 1336   K 4.5 07/26/2015 1451   CL 96* 07/26/2015 1451   CO2 23 09/05/2015 1336   CO2 23 07/26/2015 1451   BUN 7.8 09/05/2015 1336   BUN 7 07/26/2015 1451   CREATININE 1.1 09/05/2015 1336   CREATININE 1.09 07/26/2015 1451      Component Value Date/Time   CALCIUM 9.4 09/05/2015 1336   CALCIUM 9.0 07/26/2015 1451   ALKPHOS 79 09/05/2015 1336   ALKPHOS 68 07/26/2015 1451   AST 30 09/05/2015 1336   AST 23 07/26/2015 1451   ALT 22 09/05/2015 1336   ALT 19 07/26/2015 1451   BILITOT <0.30 09/05/2015 1336   BILITOT 0.7 07/26/2015 1451       RADIOGRAPHIC STUDIES: Mr [name] Wo Contrast  09-04-15  CLINICAL DATA:  Squamous cell carcinoma of the right lung. Headaches. Evaluation for brain metastases. EXAM: MRI HEAD WITHOUT AND WITH CONTRAST TECHNIQUE: Multiplanar, multiecho pulse sequences of the brain and surrounding structures were obtained without and with intravenous contrast. CONTRAST:  70m MULTIHANCE GADOBENATE DIMEGLUMINE 529 MG/ML IV SOLN COMPARISON:  Head CT 12/31/2011 FINDINGS: There is no evidence of acute infarct, intracranial hemorrhage, mass, midline shift, or extra-axial fluid collection. There is mild generalized cerebral atrophy, within normal limits for age. Patchy T2 hyperintensities throughout the subcortical and deep cerebral white matter bilaterally are nonspecific but compatible with moderate chronic small vessel ischemic disease. No abnormal enhancement is identified. Orbits are unremarkable. Paranasal sinuses and mastoid air cells are clear. Major intracranial vascular flow voids are preserved. IMPRESSION: 1. No evidence of acute abnormality or intracranial metastases. 2. Moderate chronic small vessel ischemic disease. Electronically  Signed   By: ALogan BoresM.D.   On: 02016/08/2308:54    ASSESSMENT AND PLAN: This is a very pleasant 69year old white male recently diagnosed with extensive stage small cell lung cancer currently undergoing systemic chemotherapy with carboplatin and etoposide status post 1 week of treatment and he tolerated the first week fairly well. His blood work today has been reviewed and is adequate for treatment.  The patient was seen with Dr. MJulien Nordmann  Recommended for the patient to proceed with cycle 2 of his chemo today. He will continue to have weekly labs and a restaging CT  scan prior to cycle 3 of his chemo.   For his thrush, he was prescribed Fluconazole.   He will be seen back for a follow-up visit in 3 weeks for reevaluation before starting cycle #3.  The patient was advised to call immediately if he has any concerning symptoms in the interval. The patient voices understanding of current disease status and treatment options and is in agreement with the current care plan.  All questions were answered. The patient knows to call the clinic with any problems, questions or concerns. We can certainly see the patient much sooner if necessary.  Mikey Bussing, DNP, AGPCNP-BC, AOCNP  ADDENDUM: Hematology/Oncology Attending: I had a face to face encounter with the patient today. I recommended his care plan. If this is a very pleasant 69 years old white male with extensive stage small cell lung cancer who is currently undergoing systemic chemotherapy with carboplatin and etoposide status post 1 cycle. The patient tolerated the first cycle of his treatment fairly well with no significant adverse effects except for sore mouth and oral thrush. I recommended for the patient to proceed with cycle #2 today as scheduled. For the oral thrush will start him on Diflucan. The patient would come back for follow-up visit in 3 weeks for reevaluation after repeating CT scan of the chest, abdomen and pelvis for  restaging of his disease. He was advised to call immediately if he has any concerning symptoms in the interval.  Disclaimer: This note was dictated with voice recognition software. Similar sounding words can inadvertently be transcribed and may be missed upon review. Eilleen Kempf., MD 09/05/2015

## 2015-09-05 NOTE — Progress Notes (Signed)
1635 pt shaking chills. VSS. No other symptoms. Wrapped in warm blankets and turned heater in chair on. Pt warmed up in about 5 minutes.

## 2015-09-05 NOTE — Patient Instructions (Signed)
[facility] Discharge Instructions for Patients Receiving Chemotherapy  Today you received the following chemotherapy agents VP 16 (Etoposide)/ Carboplatin  To help prevent nausea and vomiting after your treatment, we encourage you to take your nausea medication as prescribed.  If you develop nausea and vomiting that is not controlled by your nausea medication, call the clinic.   BELOW ARE SYMPTOMS THAT SHOULD BE REPORTED IMMEDIATELY:  *FEVER GREATER THAN 100.5 F  *CHILLS WITH OR WITHOUT FEVER  NAUSEA AND VOMITING THAT IS NOT CONTROLLED WITH YOUR NAUSEA MEDICATION  *UNUSUAL SHORTNESS OF BREATH  *UNUSUAL BRUISING OR BLEEDING  TENDERNESS IN MOUTH AND THROAT WITH OR WITHOUT PRESENCE OF ULCERS  *URINARY PROBLEMS  *BOWEL PROBLEMS  UNUSUAL RASH Items with * indicate a potential emergency and should be followed up as soon as possible.  Feel free to call the clinic you have any questions or concerns. The clinic phone number is [phone].  Please show the [facility] at check-in to the Emergency Department and triage nurse.

## 2015-09-06 ENCOUNTER — Ambulatory Visit (HOSPITAL_BASED_OUTPATIENT_CLINIC_OR_DEPARTMENT_OTHER): Payer: Medicare Other

## 2015-09-06 VITALS — BP 135/62 | HR 84 | Temp 98.4°F | Resp 18

## 2015-09-06 DIAGNOSIS — Z5111 Encounter for antineoplastic chemotherapy: Secondary | ICD-10-CM | POA: Diagnosis present

## 2015-09-06 DIAGNOSIS — C3491 Malignant neoplasm of unspecified part of right bronchus or lung: Secondary | ICD-10-CM | POA: Diagnosis not present

## 2015-09-06 MED ORDER — SODIUM CHLORIDE 0.9 % IV SOLN
120.0000 mg/m2 | Freq: Once | INTRAVENOUS | Status: AC
  Administered 2015-09-06: 240 mg via INTRAVENOUS
  Filled 2015-09-06: qty 12

## 2015-09-06 MED ORDER — SODIUM CHLORIDE 0.9 % IV SOLN
Freq: Once | INTRAVENOUS | Status: AC
  Administered 2015-09-06: 15:00:00 via INTRAVENOUS

## 2015-09-06 MED ORDER — SODIUM CHLORIDE 0.9 % IV SOLN
Freq: Once | INTRAVENOUS | Status: AC
  Administered 2015-09-06: 15:00:00 via INTRAVENOUS
  Filled 2015-09-06: qty 4

## 2015-09-06 NOTE — Patient Instructions (Signed)
[facility] Discharge Instructions for Patients Receiving Chemotherapy  Today you received the following chemotherapy agents Etoposide.   To help prevent nausea and vomiting after your treatment, we encourage you to take your nausea medication as prescribed.   If you develop nausea and vomiting that is not controlled by your nausea medication, call the clinic.   BELOW ARE SYMPTOMS THAT SHOULD BE REPORTED IMMEDIATELY:  *FEVER GREATER THAN 100.5 F  *CHILLS WITH OR WITHOUT FEVER  NAUSEA AND VOMITING THAT IS NOT CONTROLLED WITH YOUR NAUSEA MEDICATION  *UNUSUAL SHORTNESS OF BREATH  *UNUSUAL BRUISING OR BLEEDING  TENDERNESS IN MOUTH AND THROAT WITH OR WITHOUT PRESENCE OF ULCERS  *URINARY PROBLEMS  *BOWEL PROBLEMS  UNUSUAL RASH Items with * indicate a potential emergency and should be followed up as soon as possible.  Feel free to call the clinic you have any questions or concerns. The clinic phone number is [phone].  Please show the CHEMO ALERT CARD at check-in to the Emergency Department and triage nurse.   

## 2015-09-07 ENCOUNTER — Ambulatory Visit (HOSPITAL_BASED_OUTPATIENT_CLINIC_OR_DEPARTMENT_OTHER): Payer: Medicare Other

## 2015-09-07 VITALS — BP 124/62 | HR 77 | Temp 98.4°F | Resp 18

## 2015-09-07 DIAGNOSIS — C3491 Malignant neoplasm of unspecified part of right bronchus or lung: Secondary | ICD-10-CM | POA: Diagnosis not present

## 2015-09-07 DIAGNOSIS — Z5111 Encounter for antineoplastic chemotherapy: Secondary | ICD-10-CM

## 2015-09-07 MED ORDER — SODIUM CHLORIDE 0.9 % IV SOLN
Freq: Once | INTRAVENOUS | Status: AC
  Administered 2015-09-07: 15:00:00 via INTRAVENOUS
  Filled 2015-09-07: qty 4

## 2015-09-07 MED ORDER — SODIUM CHLORIDE 0.9 % IV SOLN
120.0000 mg/m2 | Freq: Once | INTRAVENOUS | Status: AC
  Administered 2015-09-07: 240 mg via INTRAVENOUS
  Filled 2015-09-07: qty 12

## 2015-09-07 MED ORDER — SODIUM CHLORIDE 0.9 % IV SOLN
Freq: Once | INTRAVENOUS | Status: AC
  Administered 2015-09-07: 15:00:00 via INTRAVENOUS

## 2015-09-07 NOTE — Patient Instructions (Signed)
[facility] Discharge Instructions for Patients Receiving Chemotherapy  Today you received the following chemotherapy agents: Etoposide   To help prevent nausea and vomiting after your treatment, we encourage you to take your nausea medication as directed.    If you develop nausea and vomiting that is not controlled by your nausea medication, call the clinic.   BELOW ARE SYMPTOMS THAT SHOULD BE REPORTED IMMEDIATELY:  *FEVER GREATER THAN 100.5 F  *CHILLS WITH OR WITHOUT FEVER  NAUSEA AND VOMITING THAT IS NOT CONTROLLED WITH YOUR NAUSEA MEDICATION  *UNUSUAL SHORTNESS OF BREATH  *UNUSUAL BRUISING OR BLEEDING  TENDERNESS IN MOUTH AND THROAT WITH OR WITHOUT PRESENCE OF ULCERS  *URINARY PROBLEMS  *BOWEL PROBLEMS  UNUSUAL RASH Items with * indicate a potential emergency and should be followed up as soon as possible.  Feel free to call the clinic you have any questions or concerns. The clinic phone number is [phone].  Please show the CHEMO ALERT CARD at check-in to the Emergency Department and triage nurse.   

## 2015-09-09 ENCOUNTER — Ambulatory Visit (HOSPITAL_BASED_OUTPATIENT_CLINIC_OR_DEPARTMENT_OTHER): Payer: Medicare Other

## 2015-09-09 VITALS — BP 147/71 | HR 72 | Temp 98.2°F

## 2015-09-09 DIAGNOSIS — Z5189 Encounter for other specified aftercare: Secondary | ICD-10-CM | POA: Diagnosis not present

## 2015-09-09 DIAGNOSIS — C3491 Malignant neoplasm of unspecified part of right bronchus or lung: Secondary | ICD-10-CM | POA: Diagnosis present

## 2015-09-09 MED ORDER — PEGFILGRASTIM INJECTION 6 MG/0.6ML ~~LOC~~
6.0000 mg | PREFILLED_SYRINGE | Freq: Once | SUBCUTANEOUS | Status: AC
  Administered 2015-09-09: 6 mg via SUBCUTANEOUS
  Filled 2015-09-09: qty 0.6

## 2015-09-12 ENCOUNTER — Other Ambulatory Visit (HOSPITAL_BASED_OUTPATIENT_CLINIC_OR_DEPARTMENT_OTHER): Payer: Medicare Other

## 2015-09-12 DIAGNOSIS — C3490 Malignant neoplasm of unspecified part of unspecified bronchus or lung: Secondary | ICD-10-CM

## 2015-09-12 DIAGNOSIS — C3491 Malignant neoplasm of unspecified part of right bronchus or lung: Secondary | ICD-10-CM

## 2015-09-12 LAB — CBC WITH DIFFERENTIAL/PLATELET
BASO%: 0.4 % (ref 0.0–2.0)
Basophils Absolute: 0.1 10*3/uL (ref 0.0–0.1)
EOS%: 0.1 % (ref 0.0–7.0)
Eosinophils Absolute: 0 10*3/uL (ref 0.0–0.5)
HCT: 31.9 % — ABNORMAL LOW (ref 38.4–49.9)
HGB: 10.8 g/dL — ABNORMAL LOW (ref 13.0–17.1)
LYMPH%: 8.4 % — AB (ref 14.0–49.0)
MCH: 30.7 pg (ref 27.2–33.4)
MCHC: 33.7 g/dL (ref 32.0–36.0)
MCV: 90.9 fL (ref 79.3–98.0)
MONO#: 0.2 10*3/uL (ref 0.1–0.9)
MONO%: 1.7 % (ref 0.0–14.0)
NEUT#: 11.4 10*3/uL — ABNORMAL HIGH (ref 1.5–6.5)
NEUT%: 89.4 % — AB (ref 39.0–75.0)
Platelets: 269 10*3/uL (ref 140–400)
RBC: 3.51 10*6/uL — AB (ref 4.20–5.82)
RDW: 13.9 % (ref 11.0–14.6)
WBC: 12.8 10*3/uL — ABNORMAL HIGH (ref 4.0–10.3)
lymph#: 1.1 10*3/uL (ref 0.9–3.3)

## 2015-09-12 LAB — COMPREHENSIVE METABOLIC PANEL (CC13)
ALT: 20 U/L (ref 0–55)
ANION GAP: 6 meq/L (ref 3–11)
AST: 21 U/L (ref 5–34)
Albumin: 3.3 g/dL — ABNORMAL LOW (ref 3.5–5.0)
Alkaline Phosphatase: 100 U/L (ref 40–150)
BUN: 15.3 mg/dL (ref 7.0–26.0)
CHLORIDE: 103 meq/L (ref 98–109)
CO2: 23 meq/L (ref 22–29)
CREATININE: 0.9 mg/dL (ref 0.7–1.3)
Calcium: 9.1 mg/dL (ref 8.4–10.4)
EGFR: 83 mL/min/{1.73_m2} — ABNORMAL LOW (ref >90)
Glucose: 113 mg/dl (ref 70–140)
Potassium: 4.6 mEq/L (ref 3.5–5.1)
SODIUM: 132 meq/L — AB (ref 136–145)
Total Bilirubin: 0.61 mg/dL (ref 0.20–1.20)
Total Protein: 6.5 g/dL (ref 6.4–8.3)

## 2015-09-19 ENCOUNTER — Other Ambulatory Visit (HOSPITAL_BASED_OUTPATIENT_CLINIC_OR_DEPARTMENT_OTHER): Payer: Medicare Other

## 2015-09-19 DIAGNOSIS — C3490 Malignant neoplasm of unspecified part of unspecified bronchus or lung: Secondary | ICD-10-CM | POA: Diagnosis present

## 2015-09-19 LAB — CBC WITH DIFFERENTIAL/PLATELET
BASO%: 0.5 % (ref 0.0–2.0)
BASOS ABS: 0.2 10*3/uL — AB (ref 0.0–0.1)
EOS%: 0.1 % (ref 0.0–7.0)
Eosinophils Absolute: 0 10*3/uL (ref 0.0–0.5)
HEMATOCRIT: 30.4 % — AB (ref 38.4–49.9)
HGB: 10 g/dL — ABNORMAL LOW (ref 13.0–17.1)
LYMPH#: 1.9 10*3/uL (ref 0.9–3.3)
LYMPH%: 6.5 % — ABNORMAL LOW (ref 14.0–49.0)
MCH: 30 pg (ref 27.2–33.4)
MCHC: 33 g/dL (ref 32.0–36.0)
MCV: 90.9 fL (ref 79.3–98.0)
MONO#: 0.3 10*3/uL (ref 0.1–0.9)
MONO%: 0.9 % (ref 0.0–14.0)
NEUT#: 27 10*3/uL — ABNORMAL HIGH (ref 1.5–6.5)
NEUT%: 92 % — AB (ref 39.0–75.0)
Platelets: 38 10*3/uL — ABNORMAL LOW (ref 140–400)
RBC: 3.35 10*6/uL — ABNORMAL LOW (ref 4.20–5.82)
RDW: 14 % (ref 11.0–14.6)
WBC: 29.4 10*3/uL — ABNORMAL HIGH (ref 4.0–10.3)

## 2015-09-19 LAB — COMPREHENSIVE METABOLIC PANEL (CC13)
ALT: 20 U/L (ref 0–55)
AST: 30 U/L (ref 5–34)
Albumin: 3.3 g/dL — ABNORMAL LOW (ref 3.5–5.0)
Alkaline Phosphatase: 111 U/L (ref 40–150)
Anion Gap: 9 mEq/L (ref 3–11)
BUN: 4.2 mg/dL — AB (ref 7.0–26.0)
CHLORIDE: 104 meq/L (ref 98–109)
CO2: 22 meq/L (ref 22–29)
CREATININE: 1.1 mg/dL (ref 0.7–1.3)
Calcium: 9.3 mg/dL (ref 8.4–10.4)
EGFR: 69 mL/min/{1.73_m2} — ABNORMAL LOW (ref >90)
GLUCOSE: 124 mg/dL (ref 70–140)
POTASSIUM: 4.1 meq/L (ref 3.5–5.1)
SODIUM: 135 meq/L — AB (ref 136–145)
Total Bilirubin: <0.3 mg/dL (ref 0.20–1.20)
Total Protein: 6.5 g/dL (ref 6.4–8.3)

## 2015-09-23 ENCOUNTER — Ambulatory Visit (HOSPITAL_COMMUNITY)
Admission: RE | Admit: 2015-09-23 | Discharge: 2015-09-23 | Disposition: A | Discharge status: Home / Self Care | Payer: Medicare Other | Source: Ambulatory Visit | Attending: Oncology | Admitting: Oncology

## 2015-09-23 DIAGNOSIS — I700 Atherosclerosis of aorta: Secondary | ICD-10-CM | POA: Insufficient documentation

## 2015-09-23 DIAGNOSIS — R590 Localized enlarged lymph nodes: Secondary | ICD-10-CM | POA: Diagnosis not present

## 2015-09-23 DIAGNOSIS — R599 Enlarged lymph nodes, unspecified: Secondary | ICD-10-CM | POA: Diagnosis not present

## 2015-09-23 DIAGNOSIS — C3490 Malignant neoplasm of unspecified part of unspecified bronchus or lung: Secondary | ICD-10-CM | POA: Insufficient documentation

## 2015-09-23 DIAGNOSIS — Z79899 Other long term (current) drug therapy: Secondary | ICD-10-CM | POA: Diagnosis not present

## 2015-09-23 DIAGNOSIS — N281 Cyst of kidney, acquired: Secondary | ICD-10-CM | POA: Diagnosis not present

## 2015-09-23 DIAGNOSIS — I2510 Atherosclerotic heart disease of native coronary artery without angina pectoris: Secondary | ICD-10-CM | POA: Insufficient documentation

## 2015-09-23 DIAGNOSIS — E041 Nontoxic single thyroid nodule: Secondary | ICD-10-CM | POA: Insufficient documentation

## 2015-09-23 MED ORDER — IOHEXOL 300 MG/ML  SOLN
100.0000 mL | Freq: Once | INTRAMUSCULAR | Status: AC | PRN
  Administered 2015-09-23: 100 mL via INTRAVENOUS

## 2015-09-26 ENCOUNTER — Ambulatory Visit (HOSPITAL_BASED_OUTPATIENT_CLINIC_OR_DEPARTMENT_OTHER): Payer: Medicare Other | Admitting: Oncology

## 2015-09-26 ENCOUNTER — Telehealth: Payer: Self-pay | Admitting: Internal Medicine

## 2015-09-26 ENCOUNTER — Other Ambulatory Visit: Payer: Self-pay | Admitting: Medical Oncology

## 2015-09-26 ENCOUNTER — Encounter: Payer: Self-pay | Admitting: Oncology

## 2015-09-26 ENCOUNTER — Other Ambulatory Visit (HOSPITAL_BASED_OUTPATIENT_CLINIC_OR_DEPARTMENT_OTHER): Payer: Medicare Other

## 2015-09-26 ENCOUNTER — Ambulatory Visit (HOSPITAL_BASED_OUTPATIENT_CLINIC_OR_DEPARTMENT_OTHER): Payer: Medicare Other

## 2015-09-26 VITALS — BP 140/61 | HR 70 | Temp 98.4°F | Resp 18 | Ht 65.0 in | Wt 184.6 lb

## 2015-09-26 DIAGNOSIS — C3491 Malignant neoplasm of unspecified part of right bronchus or lung: Secondary | ICD-10-CM | POA: Diagnosis not present

## 2015-09-26 DIAGNOSIS — Z5111 Encounter for antineoplastic chemotherapy: Secondary | ICD-10-CM

## 2015-09-26 DIAGNOSIS — C3490 Malignant neoplasm of unspecified part of unspecified bronchus or lung: Secondary | ICD-10-CM

## 2015-09-26 DIAGNOSIS — I878 Other specified disorders of veins: Secondary | ICD-10-CM

## 2015-09-26 LAB — COMPREHENSIVE METABOLIC PANEL (CC13)
ALBUMIN: 3.5 g/dL (ref 3.5–5.0)
ALT: 21 U/L (ref 0–55)
AST: 43 U/L — AB (ref 5–34)
Alkaline Phosphatase: 83 U/L (ref 40–150)
Anion Gap: 10 mEq/L (ref 3–11)
BUN: 6.6 mg/dL — AB (ref 7.0–26.0)
CALCIUM: 9.4 mg/dL (ref 8.4–10.4)
CHLORIDE: 104 meq/L (ref 98–109)
CO2: 21 mEq/L — ABNORMAL LOW (ref 22–29)
CREATININE: 1.1 mg/dL (ref 0.7–1.3)
EGFR: 68 mL/min/{1.73_m2} — ABNORMAL LOW (ref >90)
GLUCOSE: 110 mg/dL (ref 70–140)
POTASSIUM: 4.2 meq/L (ref 3.5–5.1)
SODIUM: 135 meq/L — AB (ref 136–145)
Total Bilirubin: <0.3 mg/dL (ref 0.20–1.20)
Total Protein: 7.1 g/dL (ref 6.4–8.3)

## 2015-09-26 LAB — CBC WITH DIFFERENTIAL/PLATELET
BASO%: 0.5 % (ref 0.0–2.0)
Basophils Absolute: 0 10*3/uL (ref 0.0–0.1)
EOS%: 0.5 % (ref 0.0–7.0)
Eosinophils Absolute: 0 10*3/uL (ref 0.0–0.5)
HCT: 34.8 % — ABNORMAL LOW (ref 38.4–49.9)
HGB: 11.8 g/dL — ABNORMAL LOW (ref 13.0–17.1)
LYMPH%: 24.1 % (ref 14.0–49.0)
MCH: 31.1 pg (ref 27.2–33.4)
MCHC: 33.9 g/dL (ref 32.0–36.0)
MCV: 91.6 fL (ref 79.3–98.0)
MONO#: 1 10*3/uL — ABNORMAL HIGH (ref 0.1–0.9)
MONO%: 23.4 % — AB (ref 0.0–14.0)
NEUT#: 2.1 10*3/uL (ref 1.5–6.5)
NEUT%: 51.5 % (ref 39.0–75.0)
Platelets: 180 10*3/uL (ref 140–400)
RBC: 3.8 10*6/uL — AB (ref 4.20–5.82)
RDW: 16.3 % — ABNORMAL HIGH (ref 11.0–14.6)
WBC: 4.2 10*3/uL (ref 4.0–10.3)
lymph#: 1 10*3/uL (ref 0.9–3.3)

## 2015-09-26 MED ORDER — SODIUM CHLORIDE 0.9 % IV SOLN
Freq: Once | INTRAVENOUS | Status: AC
  Administered 2015-09-26: 15:00:00 via INTRAVENOUS

## 2015-09-26 MED ORDER — SODIUM CHLORIDE 0.9 % IV SOLN
120.0000 mg/m2 | Freq: Once | INTRAVENOUS | Status: AC
  Administered 2015-09-26: 240 mg via INTRAVENOUS
  Filled 2015-09-26: qty 12

## 2015-09-26 MED ORDER — SODIUM CHLORIDE 0.9 % IV SOLN
Freq: Once | INTRAVENOUS | Status: AC
  Administered 2015-09-26: 15:00:00 via INTRAVENOUS
  Filled 2015-09-26: qty 8

## 2015-09-26 MED ORDER — SODIUM CHLORIDE 0.9 % IV SOLN
513.0000 mg | Freq: Once | INTRAVENOUS | Status: AC
  Administered 2015-09-26: 510 mg via INTRAVENOUS
  Filled 2015-09-26: qty 51

## 2015-09-26 NOTE — Patient Instructions (Signed)
Etoposide, VP-16 injection  What is this medicine?  ETOPOSIDE, VP-16 (e toe POE side) is a chemotherapy drug. It is used to treat testicular cancer, lung cancer, and other cancers.  This medicine may be used for other purposes; ask your health care provider or pharmacist if you have questions.  What should I tell my health care provider before I take this medicine?  They need to know if you have any of these conditions:  -infection  -kidney disease  -low blood counts, like low white cell, platelet, or red cell counts  -an unusual or allergic reaction to etoposide, other chemotherapeutic agents, other medicines, foods, dyes, or preservatives  -pregnant or trying to get pregnant  -breast-feeding  How should I use this medicine?  This medicine is for infusion into a vein. It is administered in a hospital or clinic by a specially trained health care professional.  Talk to your pediatrician regarding the use of this medicine in children. Special care may be needed.  Overdosage: If you think you have taken too much of this medicine contact a poison control center or emergency room at once.  NOTE: This medicine is only for you. Do not share this medicine with others.  What if I miss a dose?  It is important not to miss your dose. Call your doctor or health care professional if you are unable to keep an appointment.  What may interact with this medicine?  -aspirin  -certain medications for seizures like carbamazepine, phenobarbital, phenytoin, valproic acid  -cyclosporine  -levamisole  -warfarin  This list may not describe all possible interactions. Give your health care provider a list of all the medicines, herbs, non-prescription drugs, or dietary supplements you use. Also tell them if you smoke, drink alcohol, or use illegal drugs. Some items may interact with your medicine.  What should I watch for while using this medicine?  Visit your doctor for checks on your progress. This drug may make you feel generally unwell.  This is not uncommon, as chemotherapy can affect healthy cells as well as cancer cells. Report any side effects. Continue your course of treatment even though you feel ill unless your doctor tells you to stop.  In some cases, you may be given additional medicines to help with side effects. Follow all directions for their use.  Call your doctor or health care professional for advice if you get a fever, chills or sore throat, or other symptoms of a cold or flu. Do not treat yourself. This drug decreases your body's ability to fight infections. Try to avoid being around people who are sick.  This medicine may increase your risk to bruise or bleed. Call your doctor or health care professional if you notice any unusual bleeding.  Be careful brushing and flossing your teeth or using a toothpick because you may get an infection or bleed more easily. If you have any dental work done, tell your dentist you are receiving this medicine.  Avoid taking products that contain aspirin, acetaminophen, ibuprofen, naproxen, or ketoprofen unless instructed by your doctor. These medicines may hide a fever.  Do not become pregnant while taking this medicine or for at least 6 months after stopping it. Women should inform their doctor if they wish to become pregnant or think they might be pregnant. Women of child-bearing potential will need to have a negative pregnancy test before starting this medicine. There is a potential for serious side effects to an unborn child. Talk to your health care professional or   pharmacist for more information. Do not breast-feed an infant while taking this medicine.  Men must use a latex condom during sexual contact with a woman while taking this medicine and for at least 4 months after stopping it. A latex condom is needed even if you have had a vasectomy. Contact your doctor right away if your partner becomes pregnant. Do not donate sperm while taking this medicine and for at least 4 months after you stop  taking this medicine. Men should inform their doctors if they wish to father a child. This medicine may lower sperm counts.  What side effects may I notice from receiving this medicine?  Side effects that you should report to your doctor or health care professional as soon as possible:  -allergic reactions like skin rash, itching or hives, swelling of the face, lips, or tongue  -low blood counts - this medicine may decrease the number of white blood cells, red blood cells and platelets. You may be at increased risk for infections and bleeding.  -signs of infection - fever or chills, cough, sore throat, pain or difficulty passing urine  -signs of decreased platelets or bleeding - bruising, pinpoint red spots on the skin, black, tarry stools, blood in the urine  -signs of decreased red blood cells - unusually weak or tired, fainting spells, lightheadedness  -breathing problems  -changes in vision  -mouth or throat sores or ulcers  -pain, redness, swelling or irritation at the injection site  -pain, tingling, numbness in the hands or feet  -redness, blistering, peeling or loosening of the skin, including inside the mouth  -seizures  -vomiting  Side effects that usually do not require medical attention (report to your doctor or health care professional if they continue or are bothersome):  -diarrhea  -hair loss  -loss of appetite  -nausea  -stomach pain  This list may not describe all possible side effects. Call your doctor for medical advice about side effects. You may report side effects to FDA at 1-800-FDA-1088.  Where should I keep my medicine?  This drug is given in a hospital or clinic and will not be stored at home.  NOTE: This sheet is a summary. It may not cover all possible information. If you have questions about this medicine, talk to your doctor, pharmacist, or health care provider.      2016, [employer]. (2014-06-24 12:32:50)  Carboplatin injection  What is this medicine?  CARBOPLATIN (KAR boe  pla tin) is a chemotherapy drug. It targets fast dividing cells, like cancer cells, and causes these cells to die. This medicine is used to treat ovarian cancer and many other cancers.  This medicine may be used for other purposes; ask your health care provider or pharmacist if you have questions.  What should I tell my health care provider before I take this medicine?  They need to know if you have any of these conditions:  -blood disorders  -hearing problems  -kidney disease  -recent or ongoing radiation therapy  -an unusual or allergic reaction to carboplatin, cisplatin, other chemotherapy, other medicines, foods, dyes, or preservatives  -pregnant or trying to get pregnant  -breast-feeding  How should I use this medicine?  This drug is usually given as an infusion into a vein. It is administered in a hospital or clinic by a specially trained health care professional.  Talk to your pediatrician regarding the use of this medicine in children. Special care may be needed.  Overdosage: If you think you have taken   too much of this medicine contact a poison control center or emergency room at once.  NOTE: This medicine is only for you. Do not share this medicine with others.  What if I miss a dose?  It is important not to miss a dose. Call your doctor or health care professional if you are unable to keep an appointment.  What may interact with this medicine?  -medicines for seizures  -medicines to increase blood counts like filgrastim, pegfilgrastim, sargramostim  -some antibiotics like amikacin, gentamicin, neomycin, streptomycin, tobramycin  -vaccines  Talk to your doctor or health care professional before taking any of these medicines:  -acetaminophen  -aspirin  -ibuprofen  -ketoprofen  -naproxen  This list may not describe all possible interactions. Give your health care provider a list of all the medicines, herbs, non-prescription drugs, or dietary supplements you use. Also tell them if you smoke, drink alcohol, or  use illegal drugs. Some items may interact with your medicine.  What should I watch for while using this medicine?  Your condition will be monitored carefully while you are receiving this medicine. You will need important blood work done while you are taking this medicine.  This drug may make you feel generally unwell. This is not uncommon, as chemotherapy can affect healthy cells as well as cancer cells. Report any side effects. Continue your course of treatment even though you feel ill unless your doctor tells you to stop.  In some cases, you may be given additional medicines to help with side effects. Follow all directions for their use.  Call your doctor or health care professional for advice if you get a fever, chills or sore throat, or other symptoms of a cold or flu. Do not treat yourself. This drug decreases your body's ability to fight infections. Try to avoid being around people who are sick.  This medicine may increase your risk to bruise or bleed. Call your doctor or health care professional if you notice any unusual bleeding.  Be careful brushing and flossing your teeth or using a toothpick because you may get an infection or bleed more easily. If you have any dental work done, tell your dentist you are receiving this medicine.  Avoid taking products that contain aspirin, acetaminophen, ibuprofen, naproxen, or ketoprofen unless instructed by your doctor. These medicines may hide a fever.  Do not become pregnant while taking this medicine. Women should inform their doctor if they wish to become pregnant or think they might be pregnant. There is a potential for serious side effects to an unborn child. Talk to your health care professional or pharmacist for more information. Do not breast-feed an infant while taking this medicine.  What side effects may I notice from receiving this medicine?  Side effects that you should report to your doctor or health care professional as soon as possible:  -allergic  reactions like skin rash, itching or hives, swelling of the face, lips, or tongue  -signs of infection - fever or chills, cough, sore throat, pain or difficulty passing urine  -signs of decreased platelets or bleeding - bruising, pinpoint red spots on the skin, black, tarry stools, nosebleeds  -signs of decreased red blood cells - unusually weak or tired, fainting spells, lightheadedness  -breathing problems  -changes in hearing  -changes in vision  -chest pain  -high blood pressure  -low blood counts - This drug may decrease the number of white blood cells, red blood cells and platelets. You may be at increased risk for   infections and bleeding.  -nausea and vomiting  -pain, swelling, redness or irritation at the injection site  -pain, tingling, numbness in the hands or feet  -problems with balance, talking, walking  -trouble passing urine or change in the amount of urine  Side effects that usually do not require medical attention (report to your doctor or health care professional if they continue or are bothersome):  -hair loss  -loss of appetite  -metallic taste in the mouth or changes in taste  This list may not describe all possible side effects. Call your doctor for medical advice about side effects. You may report side effects to FDA at 1-800-FDA-1088.  Where should I keep my medicine?  This drug is given in a hospital or clinic and will not be stored at home.  NOTE: This sheet is a summary. It may not cover all possible information. If you have questions about this medicine, talk to your doctor, pharmacist, or health care provider.      2016, [employer]. (2008-02-03 14:38:05)

## 2015-09-26 NOTE — Progress Notes (Signed)
[facility] [phone]   [phone]  OFFICE PROGRESS NOTE  [name], [place] [address] [place] [address]  DIAGNOSIS: Extensive stage (TX, N2, M1b) small cell lung cancer in September 2016 presenting with right hilar and mediastinal lymphadenopathy as well as highly suspicious metastatic disease in the axilla bilaterally.   PRIOR THERAPY: None  CURRENT THERAPY: Systemic chemotherapy with carboplatin for AUC of 5 on day 1 and etoposide 120 MG/M2 on days one 2, 3 with Neulasta support on day 4. Status post two cycles.  INTERVAL HISTORY: Andrew Shaw 69 y.o. male returns to the clinic today for follow-up visit accompanied by his daughter. The patient tolerated the last cycle of his treatment fairly well with no significant adverse effects. Sore throat has resolved after taking fluconazole. He denied having any significant fever or chills. He has no nausea or vomiting. The patient denied having any significant chest pain, shortness of breath, cough or hemoptysis. He is here today for evaluation prior to cycle 3 and to review his recent restaging CT scan results.  MEDICAL HISTORY: Past Medical History  Diagnosis Date  . Coronary artery disease     2D ECHO, 10/31/2010 - EF >55%, normal; NUCLEAR STRESS TEST, 10/23/2010 - perfusion defect in inferior myocardial region, post-stress EF 62%, EKG negative for ischemia  . Hypertension   . Hyperlipemia   . Chronic back pain   . DVT (deep venous thrombosis) ([facility])     history 2004 after knee surg  . BPH (benign prostatic hyperplasia)   . Arthritis   . Neuromuscular disorder ([facility])   . Carpal tunnel syndrome   . 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 GERD (gastroesophageal reflux disease)   . Myocardial infarction ([facility]) 2005    from steroids  . S/P angioplasty with stent, BMS to LCX  12/23/12 12/23/2012  . Back pain 12/23/2012  . Shortness of breath     with exertion  .  COPD (chronic obstructive pulmonary disease) ([facility])   . Carpal tunnel syndrome, right   . Skin rash 08/22/2015    ALLERGIES:  is allergic to cortisone; latex; lisinopril; other; antihistamines, chlorpheniramine-type; and flexeril.  MEDICATIONS:  Current Outpatient Prescriptions  Medication Sig Dispense Refill  . aspirin EC 81 MG tablet Take 81 mg by mouth daily.    . fenofibrate (TRICOR) 48 MG tablet Take 48 mg by mouth daily.    . fluconazole (DIFLUCAN) 100 MG tablet Take 1 tablet (100 mg total) by mouth daily. 7 tablet 0  . fluticasone (FLONASE) 50 MCG/ACT nasal spray Place 2 sprays into both nostrils daily. (Patient taking differently: Place 2 sprays into both nostrils daily as needed for allergies. ) 16 g 2  . HYDROcodone-acetaminophen (NORCO) 10-325 MG per tablet Take 1 tablet by mouth every 4 (four) hours as needed (breakthrough pain).     . irbesartan (AVAPRO) 150 MG tablet Take 1 tablet (150 mg total) by mouth daily. 30 tablet 6  . metoprolol (LOPRESSOR) 50 MG tablet TAKE 1 TABLET BY MOUTH TWICE DAILY (Patient taking differently: TAKE 50 MG BY MOUTH TWICE DAILY) 60 tablet 9  . mometasone (ELOCON) 0.1 % cream Apply 1 application topically daily as needed (for rash).   3  . neomycin-bacitracin-polymyxin (NEOSPORIN) ointment Apply 1 application topically daily.    . nitroGLYCERIN (NITROSTAT) 0.4 MG SL tablet Place 1 tablet (0.4 mg total) under the tongue every 5 (five) minutes x  3 doses as needed for chest pain. 25 tablet 2  . OXYCONTIN 20 MG T12A 12 hr tablet Take 20 mg by mouth every 8 (eight) hours.  0  . prochlorperazine (COMPAZINE) 10 MG tablet Take 1 tablet (10 mg total) by mouth every 6 (six) hours as needed for nausea or vomiting. 30 tablet 1  . rosuvastatin (CRESTOR) 20 MG tablet Take 20 mg by mouth daily.    Marland Kitchen tiotropium (SPIRIVA) 18 MCG inhalation capsule Place 1 capsule (18 mcg total) into inhaler and inhale daily. 30 capsule 6   No current facility-administered medications  for this visit.   Facility-Administered Medications Ordered in Other Visits  Medication Dose Route Frequency Provider Last Rate Last Dose  . CARBOplatin (PARAPLATIN) 510 mg in sodium chloride 0.9 % 250 mL chemo infusion  510 mg Intravenous Once Curt Bears, MD 602 mL/hr at 09/26/15 1550 510 mg at 09/26/15 1550  . etoposide (VEPESID) 240 mg in sodium chloride 0.9 % 600 mL chemo infusion  120 mg/m2 (Treatment Plan Actual) Intravenous Once Curt Bears, MD        SURGICAL HISTORY:  Past Surgical History  Procedure Laterality Date  . Knee arthroscopy  04,06    left  . Joint replacement Left 2008    lt total knee  . Manipulation knee joint Left 2009    closed lt knee   . Hernia repair  2008    umb   . Cervical fusion  1999  . Back surgery  2004    lumb fusion  . Epidural block injection      multiple lumbar  . Carpal tunnel release  04/15/2012    Procedure: CARPAL TUNNEL RELEASE;  Surgeon: Wynonia Sours, MD;  Location: [facility];  Service: Orthopedics;  Laterality: Left;  . Trigger finger release  04/15/2012    Procedure: RELEASE TRIGGER FINGER/A-1 PULLEY;  Surgeon: Wynonia Sours, MD;  Location: [facility];  Service: Orthopedics;  Laterality: Left;  left thumb and little finger  . Cardiac catheterization  12/23/2012    Mid nondominant AV groove circumflex stented with a 2.5x55m Mini Vision stent resulting in a reduction of 90% stenosis to 0% residual  . Cardiac catheterization  02/07/2004    Noncritical CAD, continue medical therapy  . Cardiac catheterization  02/03/1999    Recommended medical therapy  . Left heart catheterization with coronary angiogram N/A 12/23/2012    Procedure: LEFT HEART CATHETERIZATION WITH CORONARY ANGIOGRAM;  Surgeon: JLorretta Harp MD;  [facility];  Service: Cardiovascular;  Laterality: N/A;  . Percutaneous coronary stent intervention (pci-s)  12/23/2012    Procedure: PERCUTANEOUS CORONARY STENT INTERVENTION (PCI-S);   Surgeon: JLorretta Harp MD;  [facility];  Service: Cardiovascular;;  . Left heart catheterization with coronary angiogram N/A 01/11/2015    Procedure: LEFT HEART CATHETERIZATION WITH CORONARY ANGIOGRAM;  Surgeon: Peter M JMartinique MD;  [facility];  Service: Cardiovascular;  Laterality: N/A;  . Colonoscopy w/ polypectomy    . Video bronchoscopy with endobronchial ultrasound N/A 07/28/2015    Procedure: VIDEO BRONCHOSCOPY WITH ENDOBRONCHIAL ULTRASOUND;  Surgeon: SMelrose Nakayama MD;  [facility];  Service: Thoracic;  Laterality: N/A;    REVIEW OF SYSTEMS:  A comprehensive review of systems was negative except for: Constitutional: positive for fatigue Integument/breast: positive for dryness, rash and skin lesion(s)   PHYSICAL EXAMINATION: General appearance: alert, cooperative, fatigued and no distress Head: Normocephalic, without obvious abnormality, atraumatic Neck: no adenopathy,  no JVD, supple, symmetrical, trachea midline and thyroid not enlarged, symmetric, no tenderness/mass/nodules Lymph nodes: Cervical, supraclavicular, and axillary nodes normal. Resp: clear to auscultation bilaterally Back: symmetric, no curvature. ROM normal. No CVA tenderness. Cardio: regular rate and rhythm, S1, S2 normal, no murmur, click, rub or gallop GI: soft, non-tender; bowel sounds normal; no masses,  no organomegaly Extremities: extremities normal, atraumatic, no cyanosis or edema  Skin exam showed multiple macular skin rash on the hands and arms; healing.  ECOG PERFORMANCE STATUS: 1 - Symptomatic but completely ambulatory  Blood pressure 140/61, pulse 70, temperature 98.4 F (36.9 C), temperature source Oral, resp. rate 18, height '5\' 5"'$  (1.651 m), weight 184 lb 9.6 oz (83.734 kg), SpO2 100 %.  LABORATORY DATA: Lab Results  Component Value Date   WBC 4.2 09/26/2015   HGB 11.8* 09/26/2015   HCT 34.8* 09/26/2015   MCV 91.6 09/26/2015   PLT 180 09/26/2015       Chemistry      Component Value Date/Time   NA 135* 09/26/2015 1328   NA 126* 07/26/2015 1451   K 4.2 09/26/2015 1328   K 4.5 07/26/2015 1451   CL 96* 07/26/2015 1451   CO2 21* 09/26/2015 1328   CO2 23 07/26/2015 1451   BUN 6.6* 09/26/2015 1328   BUN 7 07/26/2015 1451   CREATININE 1.1 09/26/2015 1328   CREATININE 1.09 07/26/2015 1451      Component Value Date/Time   CALCIUM 9.4 09/26/2015 1328   CALCIUM 9.0 07/26/2015 1451   ALKPHOS 83 09/26/2015 1328   ALKPHOS 68 07/26/2015 1451   AST 43* 09/26/2015 1328   AST 23 07/26/2015 1451   ALT 21 09/26/2015 1328   ALT 19 07/26/2015 1451   BILITOT <0.30 09/26/2015 1328   BILITOT 0.7 07/26/2015 1451       RADIOGRAPHIC STUDIES: Ct Chest W Contrast  09/23/2015  CLINICAL DATA:  Extensive stage small cell lung cancer, diagnosed 04/2015, chemotherapy ongoing EXAM: CT CHEST, ABDOMEN, AND PELVIS WITH CONTRAST TECHNIQUE: Multidetector CT imaging of the chest, abdomen and pelvis was performed following the standard protocol during bolus administration of intravenous contrast. CONTRAST:  154m OMNIPAQUE IOHEXOL 300 MG/ML  SOLN COMPARISON:  08/03/2015 FINDINGS: CT CHEST FINDINGS Mediastinum/Nodes: The heart is normal in size. No pericardial effusion. Coronary atherosclerosis. Atherosclerotic calcifications of the aortic arch. Improving thoracic lymphadenopathy, including: --7 mm short axis left paratracheal node (series 2/image 15), unchanged --6 mm short axis right paratracheal node (series 2/image 23), previously 9 mm --9 mm short axis right hilar node (series 2/image 28), previously 21 mm --10 mm short axis subcarinal node (series 2/image 30), previously 23 mm Visualized thyroid is notable for a dominant 15 mm posterior left thyroid nodule (series 2/image 10). Lungs/Pleura: Mild dependent atelectasis in the bilateral lower lobes. No discrete/dominant pulmonary nodules. Very mild paraseptal emphysematous changes. No focal consolidation. No pleural  effusion or pneumothorax. Musculoskeletal: Thoracic spine is within normal limits. Cervical spine fixation hardware. CT ABDOMEN PELVIS FINDINGS Hepatobiliary: Liver is within normal limits. No suspicious/enhancing hepatic lesions. Gallbladder is unremarkable. No intrahepatic or extrahepatic ductal dilatation. Pancreas: Within normal limits. Spleen: Within normal limits. Adrenals/Urinary Tract: Adrenal glands are within normal limits. 9 mm cyst in the lateral left upper kidney (series 4/ image 13). Right kidney is within normal limits. No hydronephrosis. Bladder is mildly thick-walled although underdistended. Stomach/Bowel: Stomach is within normal limits. No evidence of bowel obstruction. Normal appendix (series 2/image 91). Vascular/Lymphatic: Atherosclerotic calcifications of the abdominal aorta and branch vessels. Small upper  abdominal lymph nodes, including a 10 mm short axis portacaval node (series 2/ image 61), previously 13 mm. Reproductive: Prostatomegaly. Other: No abdominopelvic ascites. Fat within the bilateral inguinal canals. Musculoskeletal: Mild degenerative changes of the lumbar spine. IMPRESSION: Improving mediastinal and right hilar lymphadenopathy, as above. No evidence of new/progressive metastatic disease. Electronically Signed   By: Julian Hy M.D.   On: 09/23/2015 16:51   Ct Abdomen Pelvis W Contrast  09/23/2015  CLINICAL DATA:  Extensive stage small cell lung cancer, diagnosed 04/2015, chemotherapy ongoing EXAM: CT CHEST, ABDOMEN, AND PELVIS WITH CONTRAST TECHNIQUE: Multidetector CT imaging of the chest, abdomen and pelvis was performed following the standard protocol during bolus administration of intravenous contrast. CONTRAST:  164m OMNIPAQUE IOHEXOL 300 MG/ML  SOLN COMPARISON:  08/03/2015 FINDINGS: CT CHEST FINDINGS Mediastinum/Nodes: The heart is normal in size. No pericardial effusion. Coronary atherosclerosis. Atherosclerotic calcifications of the aortic arch. Improving  thoracic lymphadenopathy, including: --7 mm short axis left paratracheal node (series 2/image 15), unchanged --6 mm short axis right paratracheal node (series 2/image 23), previously 9 mm --9 mm short axis right hilar node (series 2/image 28), previously 21 mm --10 mm short axis subcarinal node (series 2/image 30), previously 23 mm Visualized thyroid is notable for a dominant 15 mm posterior left thyroid nodule (series 2/image 10). Lungs/Pleura: Mild dependent atelectasis in the bilateral lower lobes. No discrete/dominant pulmonary nodules. Very mild paraseptal emphysematous changes. No focal consolidation. No pleural effusion or pneumothorax. Musculoskeletal: Thoracic spine is within normal limits. Cervical spine fixation hardware. CT ABDOMEN PELVIS FINDINGS Hepatobiliary: Liver is within normal limits. No suspicious/enhancing hepatic lesions. Gallbladder is unremarkable. No intrahepatic or extrahepatic ductal dilatation. Pancreas: Within normal limits. Spleen: Within normal limits. Adrenals/Urinary Tract: Adrenal glands are within normal limits. 9 mm cyst in the lateral left upper kidney (series 4/ image 13). Right kidney is within normal limits. No hydronephrosis. Bladder is mildly thick-walled although underdistended. Stomach/Bowel: Stomach is within normal limits. No evidence of bowel obstruction. Normal appendix (series 2/image 91). Vascular/Lymphatic: Atherosclerotic calcifications of the abdominal aorta and branch vessels. Small upper abdominal lymph nodes, including a 10 mm short axis portacaval node (series 2/ image 61), previously 13 mm. Reproductive: Prostatomegaly. Other: No abdominopelvic ascites. Fat within the bilateral inguinal canals. Musculoskeletal: Mild degenerative changes of the lumbar spine. IMPRESSION: Improving mediastinal and right hilar lymphadenopathy, as above. No evidence of new/progressive metastatic disease. Electronically Signed   By: SJulian HyM.D.   On: 09/23/2015 16:51     ASSESSMENT AND PLAN: This is a very pleasant 69year old white male recently diagnosed with extensive stage small cell lung cancer currently undergoing systemic chemotherapy with carboplatin and etoposide. Status post 2 cycles of treatment and he tolerated the first week fairly well.  The patient was seen with Dr. MJulien Nordmann  CT scan results reviewed which show improvement in his disease. Recommend for the patient to proceed with cycle 3 of his chemo today. He will continue to have weekly labs.  He will be seen back for a follow-up visit in 3 weeks for reevaluation before starting cycle #4.  The patient was advised to call immediately if he has any concerning symptoms in the interval. The patient voices understanding of current disease status and treatment options and is in agreement with the current care plan.  All questions were answered. The patient knows to call the clinic with any problems, questions or concerns. We can certainly see the patient much sooner if necessary.  KMikey Bussing DNP, AGPCNP-BC, AOCNP  ADDENDUM: Hematology/Oncology Attending: I had a face to face encounter with the patient today. I recommended his care plan. This is a very pleasant 69 years old white male with extensive stage small cell lung cancer who is currently undergoing systemic chemotherapy was carboplatin and etoposide status post 2 cycles. The patient tolerated the first 2 cycles of his treatment well with no significant adverse effects. The recent CT scan of the chest, abdomen and pelvis showed improvement of his disease especially in the mediastinal and right hilar lymphadenopathy. I discussed the scan results with the patient and his daughter. I recommended for him to continue his current systemic chemotherapy as a scheduled. The patient will receive cycle #3 today. He would come back for follow-up visit in 3 weeks for reevaluation before starting cycle #4. The patient was advised to call immediately  if he has any concerning symptoms in the interval.  Disclaimer: This note was dictated with voice recognition software. Similar sounding words can inadvertently be transcribed and may be missed upon review.  Eilleen Kempf., MD 09/26/2015

## 2015-09-26 NOTE — Telephone Encounter (Signed)
Additional appointments added for end of December/January. Patient will be given new schedule in inf area. Other appointments remain the same.

## 2015-09-27 ENCOUNTER — Ambulatory Visit (HOSPITAL_BASED_OUTPATIENT_CLINIC_OR_DEPARTMENT_OTHER): Payer: Medicare Other

## 2015-09-27 VITALS — BP 124/58 | HR 68 | Temp 97.6°F | Resp 18

## 2015-09-27 DIAGNOSIS — C3491 Malignant neoplasm of unspecified part of right bronchus or lung: Secondary | ICD-10-CM | POA: Diagnosis not present

## 2015-09-27 DIAGNOSIS — Z5111 Encounter for antineoplastic chemotherapy: Secondary | ICD-10-CM | POA: Diagnosis present

## 2015-09-27 MED ORDER — SODIUM CHLORIDE 0.9 % IV SOLN
Freq: Once | INTRAVENOUS | Status: AC
  Administered 2015-09-27: 15:00:00 via INTRAVENOUS

## 2015-09-27 MED ORDER — ONDANSETRON HCL 8 MG PO TABS
ORAL_TABLET | ORAL | Status: AC
  Filled 2015-09-27: qty 1

## 2015-09-27 MED ORDER — SODIUM CHLORIDE 0.9 % IV SOLN
120.0000 mg/m2 | Freq: Once | INTRAVENOUS | Status: AC
  Administered 2015-09-27: 240 mg via INTRAVENOUS
  Filled 2015-09-27: qty 12

## 2015-09-27 MED ORDER — DEXAMETHASONE SODIUM PHOSPHATE 100 MG/10ML IJ SOLN
Freq: Once | INTRAMUSCULAR | Status: AC
  Administered 2015-09-27: 15:00:00 via INTRAVENOUS
  Filled 2015-09-27: qty 4

## 2015-09-27 NOTE — Patient Instructions (Signed)
[facility] Discharge Instructions for Patients Receiving Chemotherapy  Today you received the following chemotherapy agents:  Etoposide  To help prevent nausea and vomiting after your treatment, we encourage you to take your nausea medication as ordered per MD.    If you develop nausea and vomiting that is not controlled by your nausea medication, call the clinic.   BELOW ARE SYMPTOMS THAT SHOULD BE REPORTED IMMEDIATELY:  *FEVER GREATER THAN 100.5 F  *CHILLS WITH OR WITHOUT FEVER  NAUSEA AND VOMITING THAT IS NOT CONTROLLED WITH YOUR NAUSEA MEDICATION  *UNUSUAL SHORTNESS OF BREATH  *UNUSUAL BRUISING OR BLEEDING  TENDERNESS IN MOUTH AND THROAT WITH OR WITHOUT PRESENCE OF ULCERS  *URINARY PROBLEMS  *BOWEL PROBLEMS  UNUSUAL RASH Items with * indicate a potential emergency and should be followed up as soon as possible.  Feel free to call the clinic you have any questions or concerns. The clinic phone number is [phone].  Please show the CHEMO ALERT CARD at check-in to the Emergency Department and triage nurse.   

## 2015-09-28 ENCOUNTER — Ambulatory Visit (HOSPITAL_BASED_OUTPATIENT_CLINIC_OR_DEPARTMENT_OTHER): Payer: Medicare Other

## 2015-09-28 VITALS — BP 140/69 | HR 86 | Temp 98.2°F | Resp 18

## 2015-09-28 DIAGNOSIS — C3491 Malignant neoplasm of unspecified part of right bronchus or lung: Secondary | ICD-10-CM

## 2015-09-28 DIAGNOSIS — Z5111 Encounter for antineoplastic chemotherapy: Secondary | ICD-10-CM | POA: Diagnosis not present

## 2015-09-28 MED ORDER — SODIUM CHLORIDE 0.9 % IV SOLN
Freq: Once | INTRAVENOUS | Status: AC
  Administered 2015-09-28: 15:00:00 via INTRAVENOUS
  Filled 2015-09-28: qty 4

## 2015-09-28 MED ORDER — SODIUM CHLORIDE 0.9 % IV SOLN
Freq: Once | INTRAVENOUS | Status: AC
  Administered 2015-09-28: 14:00:00 via INTRAVENOUS

## 2015-09-28 MED ORDER — HEPARIN SOD (PORK) LOCK FLUSH 100 UNIT/ML IV SOLN
500.0000 [IU] | Freq: Once | INTRAVENOUS | Status: DC | PRN
  Filled 2015-09-28: qty 5

## 2015-09-28 MED ORDER — SODIUM CHLORIDE 0.9 % IV SOLN
120.0000 mg/m2 | Freq: Once | INTRAVENOUS | Status: AC
  Administered 2015-09-28: 240 mg via INTRAVENOUS
  Filled 2015-09-28: qty 12

## 2015-09-28 MED ORDER — SODIUM CHLORIDE 0.9 % IJ SOLN
10.0000 mL | INTRAMUSCULAR | Status: DC | PRN
  Filled 2015-09-28: qty 10

## 2015-09-28 NOTE — Patient Instructions (Signed)
[facility] Discharge Instructions for Patients Receiving Chemotherapy  Today you received the following chemotherapy agents:  Etoposide  To help prevent nausea and vomiting after your treatment, we encourage you to take your nausea medication as ordered per MD.    If you develop nausea and vomiting that is not controlled by your nausea medication, call the clinic.   BELOW ARE SYMPTOMS THAT SHOULD BE REPORTED IMMEDIATELY:  *FEVER GREATER THAN 100.5 F  *CHILLS WITH OR WITHOUT FEVER  NAUSEA AND VOMITING THAT IS NOT CONTROLLED WITH YOUR NAUSEA MEDICATION  *UNUSUAL SHORTNESS OF BREATH  *UNUSUAL BRUISING OR BLEEDING  TENDERNESS IN MOUTH AND THROAT WITH OR WITHOUT PRESENCE OF ULCERS  *URINARY PROBLEMS  *BOWEL PROBLEMS  UNUSUAL RASH Items with * indicate a potential emergency and should be followed up as soon as possible.  Feel free to call the clinic you have any questions or concerns. The clinic phone number is [phone].  Please show the CHEMO ALERT CARD at check-in to the Emergency Department and triage nurse.   

## 2015-09-29 ENCOUNTER — Telehealth: Payer: Self-pay | Admitting: *Deleted

## 2015-09-29 DIAGNOSIS — M4726 Other spondylosis with radiculopathy, lumbar region: Secondary | ICD-10-CM | POA: Diagnosis not present

## 2015-09-29 DIAGNOSIS — G894 Chronic pain syndrome: Secondary | ICD-10-CM | POA: Diagnosis not present

## 2015-09-29 DIAGNOSIS — M47812 Spondylosis without myelopathy or radiculopathy, cervical region: Secondary | ICD-10-CM | POA: Diagnosis not present

## 2015-09-29 DIAGNOSIS — Z79891 Long term (current) use of opiate analgesic: Secondary | ICD-10-CM | POA: Diagnosis not present

## 2015-09-29 NOTE — Telephone Encounter (Signed)
This RN spoke with patient regarding first time chemotherapy. Patient denied nausea, vomiting, diarrhea, pain, mouth sores, eating and drinking adequately, urinating fine. Instructed patient to call [facility] if he had any problems or concerns regarding chemotherapy. Patient verbalized understanding.

## 2015-09-29 NOTE — Telephone Encounter (Signed)
-----   Message from Taylor, RN sent at 09/28/2015  4:12 PM EST ----- Regarding: Dr. Julien Nordmann, Chemo FU call First time VP-16. Tolerated well.

## 2015-09-30 ENCOUNTER — Ambulatory Visit (HOSPITAL_BASED_OUTPATIENT_CLINIC_OR_DEPARTMENT_OTHER): Payer: Medicare Other

## 2015-09-30 VITALS — BP 133/61 | HR 76 | Temp 98.2°F

## 2015-09-30 DIAGNOSIS — Z5189 Encounter for other specified aftercare: Secondary | ICD-10-CM

## 2015-09-30 DIAGNOSIS — C3491 Malignant neoplasm of unspecified part of right bronchus or lung: Secondary | ICD-10-CM

## 2015-09-30 MED ORDER — PEGFILGRASTIM INJECTION 6 MG/0.6ML ~~LOC~~
6.0000 mg | PREFILLED_SYRINGE | Freq: Once | SUBCUTANEOUS | Status: AC
  Administered 2015-09-30: 6 mg via SUBCUTANEOUS
  Filled 2015-09-30: qty 0.6

## 2015-10-03 ENCOUNTER — Other Ambulatory Visit (HOSPITAL_BASED_OUTPATIENT_CLINIC_OR_DEPARTMENT_OTHER): Payer: Medicare Other

## 2015-10-03 DIAGNOSIS — C3490 Malignant neoplasm of unspecified part of unspecified bronchus or lung: Secondary | ICD-10-CM | POA: Diagnosis present

## 2015-10-03 LAB — CBC WITH DIFFERENTIAL/PLATELET
BASO%: 0.3 % (ref 0.0–2.0)
BASOS ABS: 0 10*3/uL (ref 0.0–0.1)
EOS%: 0.2 % (ref 0.0–7.0)
Eosinophils Absolute: 0 10*3/uL (ref 0.0–0.5)
HCT: 27.3 % — ABNORMAL LOW (ref 38.4–49.9)
HGB: 9 g/dL — ABNORMAL LOW (ref 13.0–17.1)
LYMPH%: 5.7 % — AB (ref 14.0–49.0)
MCH: 30.4 pg (ref 27.2–33.4)
MCHC: 32.8 g/dL (ref 32.0–36.0)
MCV: 92.6 fL (ref 79.3–98.0)
MONO#: 0.2 10*3/uL (ref 0.1–0.9)
MONO%: 1 % (ref 0.0–14.0)
NEUT#: 14.8 10*3/uL — ABNORMAL HIGH (ref 1.5–6.5)
NEUT%: 92.8 % — AB (ref 39.0–75.0)
Platelets: 68 10*3/uL — ABNORMAL LOW (ref 140–400)
RBC: 2.95 10*6/uL — AB (ref 4.20–5.82)
RDW: 16.4 % — ABNORMAL HIGH (ref 11.0–14.6)
WBC: 15.9 10*3/uL — ABNORMAL HIGH (ref 4.0–10.3)
lymph#: 0.9 10*3/uL (ref 0.9–3.3)

## 2015-10-03 LAB — COMPREHENSIVE METABOLIC PANEL (CC13)
ALT: 15 U/L (ref 0–55)
AST: 18 U/L (ref 5–34)
Albumin: 3.4 g/dL — ABNORMAL LOW (ref 3.5–5.0)
Alkaline Phosphatase: 99 U/L (ref 40–150)
Anion Gap: 8 mEq/L (ref 3–11)
BILIRUBIN TOTAL: 0.68 mg/dL (ref 0.20–1.20)
BUN: 18 mg/dL (ref 7.0–26.0)
CO2: 20 meq/L — AB (ref 22–29)
CREATININE: 1 mg/dL (ref 0.7–1.3)
Calcium: 9 mg/dL (ref 8.4–10.4)
Chloride: 103 mEq/L (ref 98–109)
EGFR: 81 mL/min/{1.73_m2} — ABNORMAL LOW (ref >90)
GLUCOSE: 113 mg/dL (ref 70–140)
Potassium: 4.5 mEq/L (ref 3.5–5.1)
Sodium: 131 mEq/L — ABNORMAL LOW (ref 136–145)
TOTAL PROTEIN: 6.4 g/dL (ref 6.4–8.3)

## 2015-10-10 ENCOUNTER — Other Ambulatory Visit (HOSPITAL_BASED_OUTPATIENT_CLINIC_OR_DEPARTMENT_OTHER): Payer: Medicare Other

## 2015-10-10 DIAGNOSIS — C3490 Malignant neoplasm of unspecified part of unspecified bronchus or lung: Secondary | ICD-10-CM

## 2015-10-10 LAB — COMPREHENSIVE METABOLIC PANEL (CC13)
ALT: 14 U/L (ref 0–55)
AST: 18 U/L (ref 5–34)
Albumin: 3.4 g/dL — ABNORMAL LOW (ref 3.5–5.0)
Alkaline Phosphatase: 89 U/L (ref 40–150)
Anion Gap: 8 mEq/L (ref 3–11)
BUN: 8.9 mg/dL (ref 7.0–26.0)
CHLORIDE: 102 meq/L (ref 98–109)
CO2: 21 meq/L — AB (ref 22–29)
CREATININE: 1.1 mg/dL (ref 0.7–1.3)
Calcium: 9.2 mg/dL (ref 8.4–10.4)
EGFR: 71 mL/min/{1.73_m2} — ABNORMAL LOW (ref >90)
GLUCOSE: 113 mg/dL (ref 70–140)
Potassium: 4.4 mEq/L (ref 3.5–5.1)
SODIUM: 132 meq/L — AB (ref 136–145)
Total Bilirubin: 0.4 mg/dL (ref 0.20–1.20)
Total Protein: 6.7 g/dL (ref 6.4–8.3)

## 2015-10-13 ENCOUNTER — Other Ambulatory Visit: Payer: Self-pay | Admitting: Radiology

## 2015-10-14 ENCOUNTER — Ambulatory Visit (HOSPITAL_COMMUNITY)
Admission: RE | Admit: 2015-10-14 | Discharge: 2015-10-14 | Disposition: A | Discharge status: Home / Self Care | Payer: Medicare Other | Source: Ambulatory Visit | Attending: Internal Medicine | Admitting: Internal Medicine

## 2015-10-14 ENCOUNTER — Other Ambulatory Visit: Payer: Self-pay | Admitting: Medical Oncology

## 2015-10-14 ENCOUNTER — Other Ambulatory Visit: Payer: Self-pay | Admitting: Radiology

## 2015-10-14 ENCOUNTER — Encounter (HOSPITAL_COMMUNITY): Payer: Self-pay

## 2015-10-14 ENCOUNTER — Telehealth: Payer: Self-pay | Admitting: Medical Oncology

## 2015-10-14 DIAGNOSIS — I878 Other specified disorders of veins: Secondary | ICD-10-CM

## 2015-10-14 DIAGNOSIS — D696 Thrombocytopenia, unspecified: Secondary | ICD-10-CM | POA: Insufficient documentation

## 2015-10-14 DIAGNOSIS — C3490 Malignant neoplasm of unspecified part of unspecified bronchus or lung: Secondary | ICD-10-CM | POA: Diagnosis not present

## 2015-10-14 DIAGNOSIS — Z5309 Procedure and treatment not carried out because of other contraindication: Secondary | ICD-10-CM | POA: Diagnosis not present

## 2015-10-14 LAB — PROTIME-INR
INR: 1.04 (ref 0.00–1.49)
PROTHROMBIN TIME: 13.8 s (ref 11.6–15.2)

## 2015-10-14 LAB — CBC WITH DIFFERENTIAL/PLATELET
BASOS PCT: 0 %
Basophils Absolute: 0 10*3/uL (ref 0.0–0.1)
EOS ABS: 0.1 10*3/uL (ref 0.0–0.7)
EOS PCT: 2 %
HCT: 22 % — ABNORMAL LOW (ref 39.0–52.0)
HEMOGLOBIN: 7.5 g/dL — AB (ref 13.0–17.0)
LYMPHS ABS: 0.9 10*3/uL (ref 0.7–4.0)
Lymphocytes Relative: 27 %
MCH: 31.9 pg (ref 26.0–34.0)
MCHC: 34.1 g/dL (ref 30.0–36.0)
MCV: 93.6 fL (ref 78.0–100.0)
MONOS PCT: 13 %
Monocytes Absolute: 0.4 10*3/uL (ref 0.1–1.0)
NEUTROS PCT: 59 %
Neutro Abs: 2 10*3/uL (ref 1.7–7.7)
PLATELETS: 21 10*3/uL — AB (ref 150–400)
RBC: 2.35 MIL/uL — ABNORMAL LOW (ref 4.22–5.81)
RDW: 17.9 % — AB (ref 11.5–15.5)
WBC: 3.4 10*3/uL — ABNORMAL LOW (ref 4.0–10.5)

## 2015-10-14 MED ORDER — CEFAZOLIN SODIUM-DEXTROSE 2-3 GM-% IV SOLR: 2.0000 g | Freq: Once | INTRAVENOUS | Status: DC

## 2015-10-14 MED ORDER — SODIUM CHLORIDE 0.9 % IV SOLN
INTRAVENOUS | Status: DC
  Administered 2015-10-14: 12:00:00 via INTRAVENOUS

## 2015-10-14 NOTE — Progress Notes (Signed)
Pt here for scheduled port placement for small cell lung cancer treatment. Receiving 3 day treatment followed by Neulasta on 4th day. Last treatment about 2 weeks ago. Struggling with IV access and was therefore scheduled for port. Pre-op labs today show PLTs of 21k  D/w Dr. Julien Nordmann who feels thrombocytopenia is treatment related and that PLTs will rebound and rise. Have rescheduled procedure for next Mon. Recheck labs prior, if at least 40k per Dr. Barbie Banner, can move forward with procedure, followed by treatment at [facility].  Plan discussed with pt and wife.  Ascencion Dike PA-C Interventional Radiology 10/14/2015 1:23 PM

## 2015-10-14 NOTE — Progress Notes (Signed)
CRITICAL VALUE ALERT  Critical value received:  platelets  Date of notification:  10/14/15  Time of notification:  9324  Critical value read back:yes  Nurse who received alert:  Gomez Cleverly RN   MD notified (1st page):  Ascencion Dike PA  Time of first page:  1230  MD notified (2nd page):  Time of second page:  Responding MD:  Ascencion Dike PA  Time MD responded:  1230 - he was here seeing patient pre procedure

## 2015-10-14 NOTE — Telephone Encounter (Signed)
Short stay will draw his labs on Monday morning so he won;t need them here.lab appt cancelled form Monday at cancer center.

## 2015-10-17 ENCOUNTER — Encounter: Payer: Self-pay | Admitting: Internal Medicine

## 2015-10-17 ENCOUNTER — Ambulatory Visit (HOSPITAL_COMMUNITY)
Admission: RE | Admit: 2015-10-17 | Discharge: 2015-10-17 | Disposition: A | Discharge status: Home / Self Care | Payer: Medicare Other | Source: Ambulatory Visit | Attending: Internal Medicine | Admitting: Internal Medicine

## 2015-10-17 ENCOUNTER — Ambulatory Visit: Payer: Medicare Other

## 2015-10-17 ENCOUNTER — Ambulatory Visit: Payer: Medicare Other | Admitting: Lab

## 2015-10-17 ENCOUNTER — Encounter (HOSPITAL_COMMUNITY): Payer: Self-pay

## 2015-10-17 ENCOUNTER — Other Ambulatory Visit: Payer: Self-pay | Admitting: Internal Medicine

## 2015-10-17 ENCOUNTER — Ambulatory Visit (HOSPITAL_BASED_OUTPATIENT_CLINIC_OR_DEPARTMENT_OTHER): Payer: Medicare Other | Admitting: Internal Medicine

## 2015-10-17 ENCOUNTER — Telehealth: Payer: Self-pay | Admitting: *Deleted

## 2015-10-17 ENCOUNTER — Other Ambulatory Visit: Payer: Self-pay | Admitting: Medical Oncology

## 2015-10-17 ENCOUNTER — Other Ambulatory Visit: Payer: Medicare Other

## 2015-10-17 ENCOUNTER — Other Ambulatory Visit: Payer: Self-pay | Admitting: Radiology

## 2015-10-17 VITALS — BP 140/63 | HR 79 | Temp 97.7°F | Resp 18 | Ht 65.0 in | Wt 184.6 lb

## 2015-10-17 DIAGNOSIS — C3490 Malignant neoplasm of unspecified part of unspecified bronchus or lung: Secondary | ICD-10-CM | POA: Insufficient documentation

## 2015-10-17 DIAGNOSIS — R530 Neoplastic (malignant) related fatigue: Secondary | ICD-10-CM

## 2015-10-17 DIAGNOSIS — C3491 Malignant neoplasm of unspecified part of right bronchus or lung: Secondary | ICD-10-CM | POA: Diagnosis not present

## 2015-10-17 DIAGNOSIS — D61810 Antineoplastic chemotherapy induced pancytopenia: Secondary | ICD-10-CM

## 2015-10-17 DIAGNOSIS — Z79899 Other long term (current) drug therapy: Secondary | ICD-10-CM | POA: Diagnosis not present

## 2015-10-17 DIAGNOSIS — D649 Anemia, unspecified: Secondary | ICD-10-CM | POA: Diagnosis not present

## 2015-10-17 DIAGNOSIS — I878 Other specified disorders of veins: Secondary | ICD-10-CM

## 2015-10-17 DIAGNOSIS — D6481 Anemia due to antineoplastic chemotherapy: Secondary | ICD-10-CM

## 2015-10-17 DIAGNOSIS — Z5111 Encounter for antineoplastic chemotherapy: Secondary | ICD-10-CM

## 2015-10-17 DIAGNOSIS — Z7982 Long term (current) use of aspirin: Secondary | ICD-10-CM | POA: Diagnosis not present

## 2015-10-17 DIAGNOSIS — Z452 Encounter for adjustment and management of vascular access device: Secondary | ICD-10-CM | POA: Diagnosis not present

## 2015-10-17 LAB — CBC WITH DIFFERENTIAL/PLATELET
Basophils Absolute: 0 10*3/uL (ref 0.0–0.1)
Basophils Relative: 0 %
EOS ABS: 0.1 10*3/uL (ref 0.0–0.7)
Eosinophils Relative: 2 %
HEMATOCRIT: 22.3 % — AB (ref 39.0–52.0)
HEMOGLOBIN: 7.6 g/dL — AB (ref 13.0–17.0)
LYMPHS ABS: 0.9 10*3/uL (ref 0.7–4.0)
LYMPHS PCT: 29 %
MCH: 33 pg (ref 26.0–34.0)
MCHC: 34.1 g/dL (ref 30.0–36.0)
MCV: 97 fL (ref 78.0–100.0)
Monocytes Absolute: 0.5 10*3/uL (ref 0.1–1.0)
Monocytes Relative: 16 %
NEUTROS ABS: 1.7 10*3/uL (ref 1.7–7.7)
NEUTROS PCT: 54 %
Platelets: 41 10*3/uL — ABNORMAL LOW (ref 150–400)
RBC: 2.3 MIL/uL — AB (ref 4.22–5.81)
RDW: 20.1 % — ABNORMAL HIGH (ref 11.5–15.5)
WBC: 3.1 10*3/uL — AB (ref 4.0–10.5)

## 2015-10-17 LAB — COMPREHENSIVE METABOLIC PANEL
ALBUMIN: 3.6 g/dL (ref 3.5–5.0)
ALK PHOS: 73 U/L (ref 38–126)
ALT: 12 U/L — AB (ref 17–63)
AST: 22 U/L (ref 15–41)
Anion gap: 8 (ref 5–15)
BUN: 9 mg/dL (ref 6–20)
CALCIUM: 9.1 mg/dL (ref 8.9–10.3)
CO2: 24 mmol/L (ref 22–32)
CREATININE: 1.18 mg/dL (ref 0.61–1.24)
Chloride: 105 mmol/L (ref 101–111)
GFR calc Af Amer: >60 mL/min (ref >60)
GFR calc non Af Amer: >60 mL/min (ref >60)
GLUCOSE: 108 mg/dL — AB (ref 65–99)
Potassium: 4 mmol/L (ref 3.5–5.1)
SODIUM: 137 mmol/L (ref 135–145)
Total Bilirubin: 0.5 mg/dL (ref 0.3–1.2)
Total Protein: 6.8 g/dL (ref 6.5–8.1)

## 2015-10-17 LAB — PREPARE RBC (CROSSMATCH)

## 2015-10-17 LAB — ABO/RH: ABO/RH(D): A POS

## 2015-10-17 MED ORDER — CEFAZOLIN SODIUM-DEXTROSE 2-3 GM-% IV SOLR
2.0000 g | Freq: Once | INTRAVENOUS | Status: AC
Start: 2015-10-17 — End: 2015-10-17
  Administered 2015-10-17: 2 g via INTRAVENOUS

## 2015-10-17 MED ORDER — MIDAZOLAM HCL 2 MG/2ML IJ SOLN
INTRAMUSCULAR | Status: AC
  Filled 2015-10-17: qty 6

## 2015-10-17 MED ORDER — CEFAZOLIN SODIUM-DEXTROSE 2-3 GM-% IV SOLR
INTRAVENOUS | Status: AC
  Administered 2015-10-17: 2 g via INTRAVENOUS
  Filled 2015-10-17: qty 50

## 2015-10-17 MED ORDER — FENTANYL CITRATE (PF) 100 MCG/2ML IJ SOLN
INTRAMUSCULAR | Status: AC
  Filled 2015-10-17: qty 4

## 2015-10-17 MED ORDER — MIDAZOLAM HCL 2 MG/2ML IJ SOLN
INTRAMUSCULAR | Status: AC | PRN
  Administered 2015-10-17: 1 mg via INTRAVENOUS
  Administered 2015-10-17: 0.5 mg via INTRAVENOUS
  Administered 2015-10-17 (×3): 1 mg via INTRAVENOUS

## 2015-10-17 MED ORDER — SODIUM CHLORIDE 0.9 % IV SOLN
INTRAVENOUS | Status: DC
  Administered 2015-10-17: 09:00:00 via INTRAVENOUS

## 2015-10-17 MED ORDER — LIDOCAINE HCL 1 % IJ SOLN
INTRAMUSCULAR | Status: AC
  Filled 2015-10-17: qty 20

## 2015-10-17 MED ORDER — FENTANYL CITRATE (PF) 100 MCG/2ML IJ SOLN
INTRAMUSCULAR | Status: AC | PRN
  Administered 2015-10-17: 50 ug via INTRAVENOUS

## 2015-10-17 MED ORDER — LIDOCAINE-EPINEPHRINE 2 %-1:100000 IJ SOLN
INTRAMUSCULAR | Status: AC
  Filled 2015-10-17: qty 1

## 2015-10-17 NOTE — Discharge Instructions (Signed)
Implanted Port Insertion, Care After °Refer to this sheet in the next few weeks. These instructions provide you with information on caring for yourself after your procedure. Your health care provider may also give you more specific instructions. Your treatment has been planned according to current medical practices, but problems sometimes occur. Call your health care provider if you have any problems or questions after your procedure. °WHAT TO EXPECT AFTER THE PROCEDURE °After your procedure, it is typical to have the following:  °· Discomfort at the port insertion site. Ice packs to the area will help. °· Bruising on the skin over the port. This will subside in 3-4 days. °HOME CARE INSTRUCTIONS °· After your port is placed, you will get a manufacturer's information card. The card has information about your port. Keep this card with you at all times.   °· Know what kind of port you have. There are many types of ports available.   °· Wear a medical alert bracelet in case of an emergency. This can help alert health care workers that you have a port.   °· The port can stay in for as long as your health care provider believes it is necessary.   °· A home health care nurse may give medicines and take care of the port.   °· You or a family member can get special training and directions for giving medicine and taking care of the port at home.   °SEEK MEDICAL CARE IF:  °· Your port does not flush or you are unable to get a blood return.   °· You have a fever or chills. °SEEK IMMEDIATE MEDICAL CARE IF: °· You have new fluid or pus coming from your incision.   °· You notice a bad smell coming from your incision site.   °· You have swelling, pain, or more redness at the incision or port site.   °· You have chest pain or shortness of breath. °  °This information is not intended to replace advice given to you by your health care provider. Make sure you discuss any questions you have with your health care provider. °  °Document  Released: 08/19/2013 Document Revised: 11/03/2013 Document Reviewed: 08/19/2013 °[employer]vier Interactive Patient Education ©2016 [employer] °Implanted Port Home Guide °An implanted port is a type of central line that is placed under the skin. Central lines are used to provide IV access when treatment or nutrition needs to be given through a person's veins. Implanted ports are used for long-term IV access. An implanted port may be placed because:  °· You need IV medicine that would be irritating to the small veins in your hands or arms.   °· You need long-term IV medicines, such as antibiotics.   °· You need IV nutrition for a long period.   °· You need frequent blood draws for lab tests.   °· You need dialysis.   °Implanted ports are usually placed in the chest area, but they can also be placed in the upper arm, the abdomen, or the leg. An implanted port has two main parts:  °· Reservoir. The reservoir is round and will appear as a small, raised area under your skin. The reservoir is the part where a needle is inserted to give medicines or draw blood.   °· Catheter. The catheter is a thin, flexible tube that extends from the reservoir. The catheter is placed into a large vein. Medicine that is inserted into the reservoir goes into the catheter and then into the vein.   °HOW WILL I CARE FOR MY INCISION SITE? °Do not get the   incision site wet. Bathe or shower as directed by your health care provider.  °HOW IS MY PORT ACCESSED? °Special steps must be taken to access the port:  °· Before the port is accessed, a numbing cream can be placed on the skin. This helps numb the skin over the port site.   °· Your health care provider uses a sterile technique to access the port. °· Your health care provider must put on a mask and sterile gloves. °· The skin over your port is cleaned carefully with an antiseptic and allowed to dry. °· The port is gently pinched between sterile gloves, and a needle is inserted into the  port. °· Only "non-coring" port needles should be used to access the port. Once the port is accessed, a blood return should be checked. This helps ensure that the port is in the vein and is not clogged.   °· If your port needs to remain accessed for a constant infusion, a clear (transparent) bandage will be placed over the needle site. The bandage and needle will need to be changed every week, or as directed by your health care provider.   °· Keep the bandage covering the needle clean and dry. Do not get it wet. Follow your health care provider's instructions on how to take a shower or bath while the port is accessed.   °· If your port does not need to stay accessed, no bandage is needed over the port.   °WHAT IS FLUSHING? °Flushing helps keep the port from getting clogged. Follow your health care provider's instructions on how and when to flush the port. Ports are usually flushed with saline solution or a medicine called heparin. The need for flushing will depend on how the port is used.  °· If the port is used for intermittent medicines or blood draws, the port will need to be flushed:   °· After medicines have been given.   °· After blood has been drawn.   °· As part of routine maintenance.   °· If a constant infusion is running, the port may not need to be flushed.   °HOW LONG WILL MY PORT STAY IMPLANTED? °The port can stay in for as long as your health care provider thinks it is needed. When it is time for the port to come out, surgery will be done to remove it. The procedure is similar to the one performed when the port was put in.  °WHEN SHOULD I SEEK IMMEDIATE MEDICAL CARE? °When you have an implanted port, you should seek immediate medical care if:  °· You notice a bad smell coming from the incision site.   °· You have swelling, redness, or drainage at the incision site.   °· You have more swelling or pain at the port site or the surrounding area.   °· You have a fever that is not controlled with  medicine. °  °This information is not intended to replace advice given to you by your health care provider. Make sure you discuss any questions you have with your health care provider. °  °Document Released: 10/29/2005 Document Revised: 08/19/2013 Document Reviewed: 07/06/2013 °[employer]vier Interactive Patient Education ©2016 [employer] °Moderate Conscious Sedation, Adult °Sedation is the use of medicines to promote relaxation and relieve discomfort and anxiety. Moderate conscious sedation is a type of sedation. Under moderate conscious sedation you are less alert than normal but are still able to respond to instructions or stimulation. Moderate conscious sedation is used during short medical and dental procedures. It is milder than deep sedation or general anesthesia and   allows you to return to your regular activities sooner. °LET YOUR HEALTH CARE PROVIDER KNOW ABOUT:  °· Any allergies you have. °· All medicines you are taking, including vitamins, herbs, eye drops, creams, and over-the-counter medicines. °· Use of steroids (by mouth or creams). °· Previous problems you or members of your family have had with the use of anesthetics. °· Any blood disorders you have. °· Previous surgeries you have had. °· Medical conditions you have. °· Possibility of pregnancy, if this applies. °· Use of cigarettes, alcohol, or illegal drugs. °RISKS AND COMPLICATIONS °Generally, this is a safe procedure. However, as with any procedure, problems can occur. Possible problems include: °· Oversedation. °· Trouble breathing on your own. You may need to have a breathing tube until you are awake and breathing on your own. °· Allergic reaction to any of the medicines used for the procedure. °BEFORE THE PROCEDURE °· You may have blood tests done. These tests can help show how well your kidneys and liver are working. They can also show how well your blood clots. °· A physical exam will be done.   °· Only take medicines as directed by your  health care provider. You may need to stop taking medicines (such as blood thinners, aspirin, or nonsteroidal anti-inflammatory drugs) before the procedure.   °· Do not eat or drink at least 6 hours before the procedure or as directed by your health care provider. °· Arrange for a responsible adult, family member, or friend to take you home after the procedure. He or she should stay with you for at least 24 hours after the procedure, until the medicine has worn off. °PROCEDURE  °· An intravenous (IV) catheter will be inserted into one of your veins. Medicine will be able to flow directly into your body through this catheter. You may be given medicine through this tube to help prevent pain and help you relax. °· The medical or dental procedure will be done. °AFTER THE PROCEDURE °· You will stay in a recovery area until the medicine has worn off. Your blood pressure and pulse will be checked.   °·  Depending on the procedure you had, you may be allowed to go home when you can tolerate liquids and your pain is under control. °  °This information is not intended to replace advice given to you by your health care provider. Make sure you discuss any questions you have with your health care provider. °  °Document Released: 07/24/2001 Document Revised: 11/19/2014 Document Reviewed: 07/06/2013 °[employer]vier Interactive Patient Education ©2016 [employer] °Moderate Conscious Sedation, Adult, Care After °Refer to this sheet in the next few weeks. These instructions provide you with information on caring for yourself after your procedure. Your health care provider may also give you more specific instructions. Your treatment has been planned according to current medical practices, but problems sometimes occur. Call your health care provider if you have any problems or questions after your procedure. °WHAT TO EXPECT AFTER THE PROCEDURE  °After your procedure: °· You may feel sleepy, clumsy, and have poor balance for several  hours. °· Vomiting may occur if you eat too soon after the procedure. °HOME CARE INSTRUCTIONS °· Do not participate in any activities where you could become injured for at least 24 hours. Do not: °¨ Drive. °¨ Swim. °¨ Ride a bicycle. °¨ Operate heavy machinery. °¨ Cook. °¨ Use power tools. °¨ Climb ladders. °¨ Work from a high place. °· Do not make important decisions or sign legal documents until you are improved. °·   If you vomit, drink water, juice, or soup when you can drink without vomiting. Make sure you have little or no nausea before eating solid foods. °· Only take over-the-counter or prescription medicines for pain, discomfort, or fever as directed by your health care provider. °· Make sure you and your family fully understand everything about the medicines given to you, including what side effects may occur. °· You should not drink alcohol, take sleeping pills, or take medicines that cause drowsiness for at least 24 hours. °· If you smoke, do not smoke without supervision. °· If you are feeling better, you may resume normal activities 24 hours after you were sedated. °· Keep all appointments with your health care provider. °SEEK MEDICAL CARE IF: °· Your skin is pale or bluish in color. °· You continue to feel nauseous or vomit. °· Your pain is getting worse and is not helped by medicine. °· You have bleeding or swelling. °· You are still sleepy or feeling clumsy after 24 hours. °SEEK IMMEDIATE MEDICAL CARE IF: °· You develop a rash. °· You have difficulty breathing. °· You develop any type of allergic problem. °· You have a fever. °MAKE SURE YOU: °· Understand these instructions. °· Will watch your condition. °· Will get help right away if you are not doing well or get worse. °  °This information is not intended to replace advice given to you by your health care provider. Make sure you discuss any questions you have with your health care provider. °  °Document Released: 08/19/2013 Document Revised:  11/19/2014 Document Reviewed: 08/19/2013 °[employer]vier Interactive Patient Education ©2016 [employer] ° °

## 2015-10-17 NOTE — Progress Notes (Signed)
Right Port intact and remains accessed or potential infusion today. Notified triage RN in [facility] of access and need to address prior to discharge.

## 2015-10-17 NOTE — Progress Notes (Signed)
[facility] [phone]   [phone]  OFFICE PROGRESS NOTE  [name], [address] [place] [address]  DIAGNOSIS: Extensive stage (TX, N2, M1b) small cell lung cancer in September 2016 presenting with right hilar and mediastinal lymphadenopathy as well as highly suspicious metastatic disease in the axilla bilaterally.   PRIOR THERAPY: None  CURRENT THERAPY: Systemic chemotherapy with carboplatin for AUC of 5 on day 1 and etoposide 120 MG/M2 on days one 2, 3 with Neulasta support on day 4. Status post 3 cycles.  INTERVAL HISTORY: RYLIE KNIERIM 69 y.o. male returns to the clinic today for follow-up visit accompanied by his wife. He is feeling fine today except for fatigue and feeling cold. He just had a Port-A-Cath placed by interventional radiology. The patient denied having any significant bleeding issues. He denied having any chest pain, shortness of breath, cough or hemoptysis. He denied having any significant nausea or vomiting. No significant weight loss or night sweats. He has no fever or chills. He was supposed to start cycle #4 today.  MEDICAL HISTORY: Past Medical History  Diagnosis Date  . Coronary artery disease     2D ECHO, 10/31/2010 - EF >55%, normal; NUCLEAR STRESS TEST, 10/23/2010 - perfusion defect in inferior myocardial region, post-stress EF 62%, EKG negative for ischemia  . Hypertension   . Hyperlipemia   . Chronic back pain   . DVT (deep venous thrombosis) ([facility])     history 2004 after knee surg  . BPH (benign prostatic hyperplasia)   . Arthritis   . Neuromuscular disorder ([facility])   . Carpal tunnel syndrome   . 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 GERD (gastroesophageal reflux disease)   . Myocardial infarction ([facility]) 2005    from steroids  . S/P angioplasty with stent, BMS to LCX  12/23/12 12/23/2012  . Back pain 12/23/2012  . Shortness of breath     with exertion  . COPD (chronic  obstructive pulmonary disease) ([facility])   . Carpal tunnel syndrome, right   . Skin rash 08/22/2015    ALLERGIES:  is allergic to cortisone; latex; lisinopril; other; antihistamines, chlorpheniramine-type; and flexeril.  MEDICATIONS:  Current Outpatient Prescriptions  Medication Sig Dispense Refill  . aspirin EC 81 MG tablet Take 81 mg by mouth daily.    . fenofibrate (TRICOR) 48 MG tablet Take 48 mg by mouth daily.    . fluconazole (DIFLUCAN) 100 MG tablet Take 1 tablet (100 mg total) by mouth daily. 7 tablet 0  . fluticasone (FLONASE) 50 MCG/ACT nasal spray Place 2 sprays into both nostrils daily. (Patient taking differently: Place 2 sprays into both nostrils daily as needed for allergies. ) 16 g 2  . HYDROcodone-acetaminophen (NORCO) 10-325 MG per tablet Take 1 tablet by mouth every 4 (four) hours as needed (breakthrough pain).     . irbesartan (AVAPRO) 150 MG tablet Take 1 tablet (150 mg total) by mouth daily. 30 tablet 6  . metoprolol (LOPRESSOR) 50 MG tablet TAKE 1 TABLET BY MOUTH TWICE DAILY (Patient taking differently: TAKE 50 MG BY MOUTH TWICE DAILY) 60 tablet 9  . mometasone (ELOCON) 0.1 % cream Apply 1 application topically daily as needed (for rash).   3  . neomycin-bacitracin-polymyxin (NEOSPORIN) ointment Apply 1 application topically daily.    . nitroGLYCERIN (NITROSTAT) 0.4 MG SL tablet Place 1 tablet (0.4 mg total) under the tongue every 5 (five) minutes x 3  doses as needed for chest pain. 25 tablet 2  . OXYCONTIN 20 MG T12A 12 hr tablet Take 20 mg by mouth every 8 (eight) hours.  0  . prochlorperazine (COMPAZINE) 10 MG tablet Take 1 tablet (10 mg total) by mouth every 6 (six) hours as needed for nausea or vomiting. 30 tablet 1  . rosuvastatin (CRESTOR) 20 MG tablet Take 20 mg by mouth daily.    Marland Kitchen tiotropium (SPIRIVA) 18 MCG inhalation capsule Place 1 capsule (18 mcg total) into inhaler and inhale daily. 30 capsule 6   No current facility-administered medications for this visit.    Facility-Administered Medications Ordered in Other Visits  Medication Dose Route Frequency Provider Last Rate Last Dose  . 0.9 %  sodium chloride infusion   Intravenous Continuous Curt Bears, MD 10 mL/hr at 10/17/15 0857    . lidocaine (XYLOCAINE) 1 % (with pres) injection           . lidocaine-EPINEPHrine (XYLOCAINE W/EPI) 2 %-1:100000 (with pres) injection             SURGICAL HISTORY:  Past Surgical History  Procedure Laterality Date  . Knee arthroscopy  04,06    left  . Joint replacement Left 2008    lt total knee  . Manipulation knee joint Left 2009    closed lt knee   . Hernia repair  2008    umb   . Cervical fusion  1999  . Back surgery  2004    lumb fusion  . Epidural block injection      multiple lumbar  . Carpal tunnel release  04/15/2012    Procedure: CARPAL TUNNEL RELEASE;  Surgeon: Wynonia Sours, MD;  Location: [facility];  Service: Orthopedics;  Laterality: Left;  . Trigger finger release  04/15/2012    Procedure: RELEASE TRIGGER FINGER/A-1 PULLEY;  Surgeon: Wynonia Sours, MD;  Location: [facility];  Service: Orthopedics;  Laterality: Left;  left thumb and little finger  . Cardiac catheterization  12/23/2012    Mid nondominant AV groove circumflex stented with a 2.5x71m Mini Vision stent resulting in a reduction of 90% stenosis to 0% residual  . Cardiac catheterization  02/07/2004    Noncritical CAD, continue medical therapy  . Cardiac catheterization  02/03/1999    Recommended medical therapy  . Left heart catheterization with coronary angiogram N/A 12/23/2012    Procedure: LEFT HEART CATHETERIZATION WITH CORONARY ANGIOGRAM;  Surgeon: JLorretta Harp MD;  [facility];  Service: Cardiovascular;  Laterality: N/A;  . Percutaneous coronary stent intervention (pci-s)  12/23/2012    Procedure: PERCUTANEOUS CORONARY STENT INTERVENTION (PCI-S);  Surgeon: JLorretta Harp MD;  [facility];  Service: Cardiovascular;;  .  Left heart catheterization with coronary angiogram N/A 01/11/2015    Procedure: LEFT HEART CATHETERIZATION WITH CORONARY ANGIOGRAM;  Surgeon: Peter M JMartinique MD;  [facility];  Service: Cardiovascular;  Laterality: N/A;  . Colonoscopy w/ polypectomy    . Video bronchoscopy with endobronchial ultrasound N/A 07/28/2015    Procedure: VIDEO BRONCHOSCOPY WITH ENDOBRONCHIAL ULTRASOUND;  Surgeon: SMelrose Nakayama MD;  [facility]  Service: Thoracic;  Laterality: N/A;    REVIEW OF SYSTEMS:  Constitutional: positive for fatigue Eyes: negative Ears, nose, mouth, throat, and face: negative Respiratory: negative Cardiovascular: negative Gastrointestinal: negative Genitourinary:negative Integument/breast: negative Hematologic/lymphatic: negative Musculoskeletal:negative Neurological: negative Behavioral/Psych: negative Endocrine: negative Allergic/Immunologic: negative   PHYSICAL EXAMINATION: General appearance: alert, cooperative, fatigued and no distress Head: Normocephalic,  without obvious abnormality, atraumatic Neck: no adenopathy, no JVD, supple, symmetrical, trachea midline and thyroid not enlarged, symmetric, no tenderness/mass/nodules Lymph nodes: Cervical, supraclavicular, and axillary nodes normal. Resp: clear to auscultation bilaterally Back: symmetric, no curvature. ROM normal. No CVA tenderness. Cardio: regular rate and rhythm, S1, S2 normal, no murmur, click, rub or gallop GI: soft, non-tender; bowel sounds normal; no masses,  no organomegaly Extremities: extremities normal, atraumatic, no cyanosis or edema    ECOG PERFORMANCE STATUS: 1 - Symptomatic but completely ambulatory  Blood pressure 140/63, pulse 79, temperature 97.7 F (36.5 C), temperature source Oral, resp. rate 18, height '5\' 5"'$  (1.651 m), weight 184 lb 9.6 oz (83.734 kg), SpO2 99 %.  LABORATORY DATA: Lab Results  Component Value Date   WBC 3.1* 10/17/2015   HGB 7.6* 10/17/2015   HCT 22.3*  10/17/2015   MCV 97.0 10/17/2015   PLT 41* 10/17/2015      Chemistry      Component Value Date/Time   NA 137 10/17/2015 0855   NA 132* 10/10/2015 1344   K 4.0 10/17/2015 0855   K 4.4 10/10/2015 1344   CL 105 10/17/2015 0855   CO2 24 10/17/2015 0855   CO2 21* 10/10/2015 1344   BUN 9 10/17/2015 0855   BUN 8.9 10/10/2015 1344   CREATININE 1.18 10/17/2015 0855   CREATININE 1.1 10/10/2015 1344      Component Value Date/Time   CALCIUM 9.1 10/17/2015 0855   CALCIUM 9.2 10/10/2015 1344   ALKPHOS 73 10/17/2015 0855   ALKPHOS 89 10/10/2015 1344   AST 22 10/17/2015 0855   AST 18 10/10/2015 1344   ALT 12* 10/17/2015 0855   ALT 14 10/10/2015 1344   BILITOT 0.5 10/17/2015 0855   BILITOT 0.40 10/10/2015 1344       RADIOGRAPHIC STUDIES: Ct Chest W Contrast  09/23/2015  CLINICAL DATA:  Extensive stage small cell lung cancer, diagnosed 04/2015, chemotherapy ongoing EXAM: CT CHEST, ABDOMEN, AND PELVIS WITH CONTRAST TECHNIQUE: Multidetector CT imaging of the chest, abdomen and pelvis was performed following the standard protocol during bolus administration of intravenous contrast. CONTRAST:  153m OMNIPAQUE IOHEXOL 300 MG/ML  SOLN COMPARISON:  08/03/2015 FINDINGS: CT CHEST FINDINGS Mediastinum/Nodes: The heart is normal in size. No pericardial effusion. Coronary atherosclerosis. Atherosclerotic calcifications of the aortic arch. Improving thoracic lymphadenopathy, including: --7 mm short axis left paratracheal node (series 2/image 15), unchanged --6 mm short axis right paratracheal node (series 2/image 23), previously 9 mm --9 mm short axis right hilar node (series 2/image 28), previously 21 mm --10 mm short axis subcarinal node (series 2/image 30), previously 23 mm Visualized thyroid is notable for a dominant 15 mm posterior left thyroid nodule (series 2/image 10). Lungs/Pleura: Mild dependent atelectasis in the bilateral lower lobes. No discrete/dominant pulmonary nodules. Very mild paraseptal  emphysematous changes. No focal consolidation. No pleural effusion or pneumothorax. Musculoskeletal: Thoracic spine is within normal limits. Cervical spine fixation hardware. CT ABDOMEN PELVIS FINDINGS Hepatobiliary: Liver is within normal limits. No suspicious/enhancing hepatic lesions. Gallbladder is unremarkable. No intrahepatic or extrahepatic ductal dilatation. Pancreas: Within normal limits. Spleen: Within normal limits. Adrenals/Urinary Tract: Adrenal glands are within normal limits. 9 mm cyst in the lateral left upper kidney (series 4/ image 13). Right kidney is within normal limits. No hydronephrosis. Bladder is mildly thick-walled although underdistended. Stomach/Bowel: Stomach is within normal limits. No evidence of bowel obstruction. Normal appendix (series 2/image 91). Vascular/Lymphatic: Atherosclerotic calcifications of the abdominal aorta and branch vessels. Small upper abdominal lymph nodes, including a  10 mm short axis portacaval node (series 2/ image 61), previously 13 mm. Reproductive: Prostatomegaly. Other: No abdominopelvic ascites. Fat within the bilateral inguinal canals. Musculoskeletal: Mild degenerative changes of the lumbar spine. IMPRESSION: Improving mediastinal and right hilar lymphadenopathy, as above. No evidence of new/progressive metastatic disease. Electronically Signed   By: Julian Hy M.D.   On: 09/23/2015 16:51   Ct Abdomen Pelvis W Contrast  09/23/2015  CLINICAL DATA:  Extensive stage small cell lung cancer, diagnosed 04/2015, chemotherapy ongoing EXAM: CT CHEST, ABDOMEN, AND PELVIS WITH CONTRAST TECHNIQUE: Multidetector CT imaging of the chest, abdomen and pelvis was performed following the standard protocol during bolus administration of intravenous contrast. CONTRAST:  148m OMNIPAQUE IOHEXOL 300 MG/ML  SOLN COMPARISON:  08/03/2015 FINDINGS: CT CHEST FINDINGS Mediastinum/Nodes: The heart is normal in size. No pericardial effusion. Coronary atherosclerosis.  Atherosclerotic calcifications of the aortic arch. Improving thoracic lymphadenopathy, including: --7 mm short axis left paratracheal node (series 2/image 15), unchanged --6 mm short axis right paratracheal node (series 2/image 23), previously 9 mm --9 mm short axis right hilar node (series 2/image 28), previously 21 mm --10 mm short axis subcarinal node (series 2/image 30), previously 23 mm Visualized thyroid is notable for a dominant 15 mm posterior left thyroid nodule (series 2/image 10). Lungs/Pleura: Mild dependent atelectasis in the bilateral lower lobes. No discrete/dominant pulmonary nodules. Very mild paraseptal emphysematous changes. No focal consolidation. No pleural effusion or pneumothorax. Musculoskeletal: Thoracic spine is within normal limits. Cervical spine fixation hardware. CT ABDOMEN PELVIS FINDINGS Hepatobiliary: Liver is within normal limits. No suspicious/enhancing hepatic lesions. Gallbladder is unremarkable. No intrahepatic or extrahepatic ductal dilatation. Pancreas: Within normal limits. Spleen: Within normal limits. Adrenals/Urinary Tract: Adrenal glands are within normal limits. 9 mm cyst in the lateral left upper kidney (series 4/ image 13). Right kidney is within normal limits. No hydronephrosis. Bladder is mildly thick-walled although underdistended. Stomach/Bowel: Stomach is within normal limits. No evidence of bowel obstruction. Normal appendix (series 2/image 91). Vascular/Lymphatic: Atherosclerotic calcifications of the abdominal aorta and branch vessels. Small upper abdominal lymph nodes, including a 10 mm short axis portacaval node (series 2/ image 61), previously 13 mm. Reproductive: Prostatomegaly. Other: No abdominopelvic ascites. Fat within the bilateral inguinal canals. Musculoskeletal: Mild degenerative changes of the lumbar spine. IMPRESSION: Improving mediastinal and right hilar lymphadenopathy, as above. No evidence of new/progressive metastatic disease. Electronically  Signed   By: SJulian HyM.D.   On: 09/23/2015 16:51    ASSESSMENT AND PLAN: This is a very pleasant 69years old white male recently diagnosed with extensive stage small cell lung cancer currently undergoing systemic chemotherapy with carboplatin and etoposide status post 3 cycles and tolerating his treatment well except for pancytopenia.  Has significant fatigue secondary to chemotherapy-induced anemia. His CBC showed leukocytopenia, anemia and thrombocytopenia. I recommended for the patient today the start of cycle #4 by 1 week until recovery of his blood count. For the chemotherapy-induced anemia, I will arrange for the patient to receive 2 units of PRBCs transfusion today. He will come back for follow-up visit in one month's for reevaluation after repeating CT scan of the chest for restaging of his disease. The patient was advised to call immediately if he has any concerning symptoms in the interval. The patient voices understanding of current disease status and treatment options and is in agreement with the current care plan.  All questions were answered. The patient knows to call the clinic with any problems, questions or concerns. We can certainly see the patient  much sooner if necessary.  Disclaimer: This note was dictated with voice recognition software. Similar sounding words can inadvertently be transcribed and may not be corrected upon review.

## 2015-10-17 NOTE — Patient Instructions (Signed)
Anemia, Nonspecific Anemia is a condition in which the concentration of red blood cells or hemoglobin in the blood is below normal. Hemoglobin is a substance in red blood cells that carries oxygen to the tissues of the body. Anemia results in not enough oxygen reaching these tissues.  CAUSES  Common causes of anemia include:   Excessive bleeding. Bleeding may be internal or external. This includes excessive bleeding from periods (in women) or from the intestine.   Poor nutrition.   Chronic kidney, thyroid, and liver disease.  Bone marrow disorders that decrease red blood cell production.  Cancer and treatments for cancer.  HIV, AIDS, and their treatments.  Spleen problems that increase red blood cell destruction.  Blood disorders.  Excess destruction of red blood cells due to infection, medicines, and autoimmune disorders. SIGNS AND SYMPTOMS   Minor weakness.   Dizziness.   Headache.  Palpitations.   Shortness of breath, especially with exercise.   Paleness.  Cold sensitivity.  Indigestion.  Nausea.  Difficulty sleeping.  Difficulty concentrating. Symptoms may occur suddenly or they may develop slowly.  DIAGNOSIS  Additional blood tests are often needed. These help your health care provider determine the best treatment. Your health care provider will check your stool for blood and look for other causes of blood loss.  TREATMENT  Treatment varies depending on the cause of the anemia. Treatment can include:   Supplements of iron, vitamin B12, or folic acid.   Hormone medicines.   A blood transfusion. This may be needed if blood loss is severe.   Hospitalization. This may be needed if there is significant continual blood loss.   Dietary changes.  Spleen removal. HOME CARE INSTRUCTIONS Keep all follow-up appointments. It often takes many weeks to correct anemia, and having your health care provider check on your condition and your response to  treatment is very important. SEEK IMMEDIATE MEDICAL CARE IF:   You develop extreme weakness, shortness of breath, or chest pain.   You become dizzy or have trouble concentrating.  You develop heavy vaginal bleeding.   You develop a rash.   You have bloody or black, tarry stools.   You faint.   You vomit up blood.   You vomit repeatedly.   You have abdominal pain.  You have a fever or persistent symptoms for more than 2-3 days.   You have a fever and your symptoms suddenly get worse.   You are dehydrated.  MAKE SURE YOU:  Understand these instructions.  Will watch your condition.  Will get help right away if you are not doing well or get worse.   This information is not intended to replace advice given to you by your health care provider. Make sure you discuss any questions you have with your health care provider.   Document Released: 12/06/2004 Document Revised: 07/01/2013 Document Reviewed: 04/24/2013 Elsevier Interactive Patient Education 2016 [employer]  

## 2015-10-17 NOTE — H&P (Signed)
HPI: Patient with extensive small cell lung cancer who has been seen by Dr. Julien Nordmann on 09/26/15. He was previously scheduled 10/14/15 for port placement however plt count at that time was 21K Per Dr. Julien Nordmann felt to be secondary to recent treatment and will rebound. The patient was rescheduled today for image guided port a catheter placement.  The patient has had a H&P performed within the last 30 days, all history, medications, and exam have been reviewed. The patient denies any interval changes since the H&P.  Medications: Prior to Admission medications   Medication Sig Start Date End Date Taking? Authorizing Provider  aspirin EC 81 MG tablet Take 81 mg by mouth daily.   Yes Historical Provider, MD  fenofibrate (TRICOR) 48 MG tablet Take 48 mg by mouth daily.   Yes Historical Provider, MD  fluconazole (DIFLUCAN) 100 MG tablet Take 1 tablet (100 mg total) by mouth daily. 09/05/15  Yes Maryanna Shape, NP  fluticasone (FLONASE) 50 MCG/ACT nasal spray Place 2 sprays into both nostrils daily. Patient taking differently: Place 2 sprays into both nostrils daily as needed for allergies.  07/11/15  Yes Brand Males, MD  HYDROcodone-acetaminophen (NORCO) 10-325 MG per tablet Take 1 tablet by mouth every 4 (four) hours as needed (breakthrough pain).    Yes Historical Provider, MD  irbesartan (AVAPRO) 150 MG tablet Take 1 tablet (150 mg total) by mouth daily. 04/12/15  Yes Lorretta Harp, MD  metoprolol (LOPRESSOR) 50 MG tablet TAKE 1 TABLET BY MOUTH TWICE DAILY Patient taking differently: TAKE 50 MG BY MOUTH TWICE DAILY 04/04/15  Yes Lorretta Harp, MD  mometasone (ELOCON) 0.1 % cream Apply 1 application topically daily as needed (for rash).  06/24/15  Yes Historical Provider, MD  neomycin-bacitracin-polymyxin (NEOSPORIN) ointment Apply 1 application topically daily.   Yes Historical Provider, MD  OXYCONTIN 20 MG T12A 12 hr tablet Take 20 mg by mouth every 8 (eight) hours. 12/28/14  Yes Historical  Provider, MD  rosuvastatin (CRESTOR) 20 MG tablet Take 20 mg by mouth daily.   Yes Historical Provider, MD  tiotropium (SPIRIVA) 18 MCG inhalation capsule Place 1 capsule (18 mcg total) into inhaler and inhale daily. 07/11/15  Yes Brand Males, MD  nitroGLYCERIN (NITROSTAT) 0.4 MG SL tablet Place 1 tablet (0.4 mg total) under the tongue every 5 (five) minutes x 3 doses as needed for chest pain. 12/24/12   Erlene Quan, PA-C  prochlorperazine (COMPAZINE) 10 MG tablet Take 1 tablet (10 mg total) by mouth every 6 (six) hours as needed for nausea or vomiting. 08/16/15   Curt Bears, MD     Vital Signs: BP 118/62 mmHg  Pulse 65  Temp(Src) 97.5 F (36.4 C) (Oral)  Resp 18  SpO2 97%  Physical Exam  Constitutional: He is oriented to person, place, and time. No distress.  HENT:  Head: Normocephalic and atraumatic.  Neck: No tracheal deviation present.  Cardiovascular: Normal rate and regular rhythm.  Exam reveals no gallop and no friction rub.   No murmur heard. Pulmonary/Chest: Effort normal and breath sounds normal. No respiratory distress. He has no wheezes. He has no rales.  Abdominal: Soft. Bowel sounds are normal. He exhibits no distension. There is no tenderness.  Neurological: He is alert and oriented to person, place, and time.  Skin: Skin is warm and dry. He is not diaphoretic.    Mallampati Score:  MD Evaluation Airway: WNL Heart: WNL Abdomen: WNL Chest/ Lungs: WNL ASA  Classification: 3 Mallampati/Airway Score: Two  Labs:  CBC:  Recent Labs  09/26/15 1328 10/03/15 1438 10/14/15 1134 10/17/15 0855  WBC 4.2 15.9* 3.4* 3.1*  HGB 11.8* 9.0* 7.5* 7.6*  HCT 34.8* 27.3* 22.0* 22.3*  PLT 180 68* 21* 41*    COAGS:  Recent Labs  01/10/15 0226 07/26/15 1451 10/14/15 1134  INR 1.08 1.10 1.04  APTT  --  36  --     BMP:  Recent Labs  01/09/15 0735 01/12/15 0430 07/26/15 1451  09/26/15 1328 10/03/15 1438 10/10/15 1344 10/17/15 0855  NA 125* 129*  126*  < > 135* 131* 132* 137  K 4.2 3.8 4.5  < > 4.2 4.5 4.4 4.0  CL 95* 101 96*  --   --   --   --  105  CO2 '24 21 23  '$ < > 21* 20* 21* 24  GLUCOSE 110* 110* 90  < > 110 113 113 108*  BUN <5* 8 7  < > 6.6* 18.0 8.9 9  CALCIUM 8.8 9.0 9.0  < > 9.4 9.0 9.2 9.1  CREATININE 1.00 0.92 1.09  < > 1.1 1.0 1.1 1.18  GFRNONAA 75* 85* >60  --   --   --   --  >60  GFRAA 87* >90 >60  --   --   --   --  >60  < > = values in this interval not displayed.  LIVER FUNCTION TESTS:  Recent Labs  09/26/15 1328 10/03/15 1438 10/10/15 1344 10/17/15 0855  BILITOT <0.30 0.68 0.40 0.5  AST 43* '18 18 22  '$ ALT '21 15 14 '$ 12*  ALKPHOS 83 99 89 73  PROT 7.1 6.4 6.7 6.8  ALBUMIN 3.5 3.4* 3.4* 3.6    Assessment/Plan:  Extensive small cell lung cancer Seen by Dr. Julien Nordmann on 09/26/15 Scheduled 10/14/15 for port placement however plt count at that time was 21K- Per Dr. Julien Nordmann felt to be secondary to recent treatment and will rebound Rescheduled for today for image guided port a catheter placement with sedation and chemotherapy to follow The patient has been NPO, no blood thinners taken, labs and vitals have been reviewed. Risks and Benefits discussed with the patient including, but not limited to bleeding, infection, pneumothorax, or fibrin sheath development and need for additional procedures. All of the patient's questions were answered, patient is agreeable to proceed. Consent signed and in chart.   SignedHedy Jacob 10/17/2015, 9:44 AM

## 2015-10-17 NOTE — Telephone Encounter (Signed)
Call from short stay that patient is still accessed.  His platelets are low and he'll need to be de-accessed.  Called infusion room and scheduler.  Patient has gone to lab and is to be transfused today.

## 2015-10-17 NOTE — Procedures (Signed)
RIJV PAC SVC RA No comp 

## 2015-10-18 ENCOUNTER — Other Ambulatory Visit: Payer: Medicare Other

## 2015-10-18 ENCOUNTER — Ambulatory Visit: Payer: Medicare Other

## 2015-10-18 ENCOUNTER — Ambulatory Visit (HOSPITAL_BASED_OUTPATIENT_CLINIC_OR_DEPARTMENT_OTHER): Payer: Medicare Other

## 2015-10-18 ENCOUNTER — Other Ambulatory Visit: Payer: Self-pay | Admitting: Medical Oncology

## 2015-10-18 ENCOUNTER — Other Ambulatory Visit: Payer: Self-pay | Admitting: *Deleted

## 2015-10-18 DIAGNOSIS — D649 Anemia, unspecified: Secondary | ICD-10-CM

## 2015-10-18 DIAGNOSIS — C3491 Malignant neoplasm of unspecified part of right bronchus or lung: Secondary | ICD-10-CM | POA: Diagnosis not present

## 2015-10-18 DIAGNOSIS — C3490 Malignant neoplasm of unspecified part of unspecified bronchus or lung: Secondary | ICD-10-CM

## 2015-10-18 LAB — PREPARE RBC (CROSSMATCH)

## 2015-10-18 LAB — TYPE AND SCREEN
ABO/RH(D): A POS
Antibody Screen: NEGATIVE
Unit division: 0
Unit division: 0

## 2015-10-18 MED ORDER — ACETAMINOPHEN 325 MG PO TABS: 650.0000 mg | ORAL_TABLET | Freq: Once | ORAL | Status: DC

## 2015-10-18 MED ORDER — HEPARIN SOD (PORK) LOCK FLUSH 100 UNIT/ML IV SOLN
500.0000 [IU] | Freq: Every day | INTRAVENOUS | Status: AC | PRN
  Administered 2015-10-18: 500 [IU]
  Filled 2015-10-18: qty 5

## 2015-10-18 MED ORDER — SODIUM CHLORIDE 0.9 % IV SOLN
250.0000 mL | Freq: Once | INTRAVENOUS | Status: AC
  Administered 2015-10-18: 250 mL via INTRAVENOUS

## 2015-10-18 MED ORDER — DIPHENHYDRAMINE HCL 25 MG PO CAPS
ORAL_CAPSULE | ORAL | Status: AC
  Filled 2015-10-18: qty 1

## 2015-10-18 MED ORDER — ACETAMINOPHEN 325 MG PO TABS
ORAL_TABLET | ORAL | Status: AC
Start: 2015-10-18 — End: 2015-10-18
  Filled 2015-10-18: qty 2

## 2015-10-18 MED ORDER — DIPHENHYDRAMINE HCL 25 MG PO CAPS: 25.0000 mg | ORAL_CAPSULE | Freq: Once | ORAL | Status: DC

## 2015-10-18 MED ORDER — SODIUM CHLORIDE 0.9 % IJ SOLN
10.0000 mL | INTRAMUSCULAR | Status: AC | PRN
  Administered 2015-10-18: 10 mL
  Filled 2015-10-18: qty 10

## 2015-10-18 MED ORDER — LIDOCAINE-PRILOCAINE 2.5-2.5 % EX CREA: TOPICAL_CREAM | CUTANEOUS | Status: AC

## 2015-10-18 MED ORDER — LIDOCAINE-PRILOCAINE 2.5-2.5 % EX CREA
TOPICAL_CREAM | CUTANEOUS | Status: AC
  Filled 2015-10-18: qty 5

## 2015-10-18 NOTE — Progress Notes (Signed)
Patient did not have blue armband for blood transfusion. Type and cross redrawn and new blood bracelet applied. Patient instructed to keep bracelet on and he will receive 2 units of blood tomorrow.

## 2015-10-19 ENCOUNTER — Ambulatory Visit (HOSPITAL_BASED_OUTPATIENT_CLINIC_OR_DEPARTMENT_OTHER): Payer: Medicare Other

## 2015-10-19 VITALS — BP 123/57 | HR 68 | Temp 97.8°F | Resp 18

## 2015-10-19 DIAGNOSIS — C3490 Malignant neoplasm of unspecified part of unspecified bronchus or lung: Secondary | ICD-10-CM | POA: Diagnosis not present

## 2015-10-19 DIAGNOSIS — C3491 Malignant neoplasm of unspecified part of right bronchus or lung: Secondary | ICD-10-CM | POA: Diagnosis not present

## 2015-10-19 DIAGNOSIS — D649 Anemia, unspecified: Secondary | ICD-10-CM

## 2015-10-19 LAB — PREPARE RBC (CROSSMATCH)

## 2015-10-19 MED ORDER — SODIUM CHLORIDE 0.9 % IV SOLN
250.0000 mL | Freq: Once | INTRAVENOUS | Status: AC
  Administered 2015-10-19: 250 mL via INTRAVENOUS

## 2015-10-19 MED ORDER — DIPHENHYDRAMINE HCL 25 MG PO CAPS
25.0000 mg | ORAL_CAPSULE | Freq: Once | ORAL | Status: AC
Start: 2015-10-19 — End: 2015-10-19
  Administered 2015-10-19: 25 mg via ORAL

## 2015-10-19 MED ORDER — DIPHENHYDRAMINE HCL 25 MG PO CAPS
ORAL_CAPSULE | ORAL | Status: AC
  Filled 2015-10-19: qty 1

## 2015-10-19 MED ORDER — SODIUM CHLORIDE 0.9 % IV SOLN: 250.0000 mL | Freq: Once | INTRAVENOUS | Status: DC

## 2015-10-19 MED ORDER — HEPARIN SOD (PORK) LOCK FLUSH 100 UNIT/ML IV SOLN
500.0000 [IU] | Freq: Every day | INTRAVENOUS | Status: AC | PRN
  Administered 2015-10-19: 500 [IU]
  Filled 2015-10-19: qty 5

## 2015-10-19 MED ORDER — ACETAMINOPHEN 325 MG PO TABS
650.0000 mg | ORAL_TABLET | Freq: Once | ORAL | Status: AC
  Administered 2015-10-19: 650 mg via ORAL

## 2015-10-19 MED ORDER — ACETAMINOPHEN 325 MG PO TABS
ORAL_TABLET | ORAL | Status: AC
  Filled 2015-10-19: qty 2

## 2015-10-19 MED ORDER — SODIUM CHLORIDE 0.9 % IJ SOLN
10.0000 mL | INTRAMUSCULAR | Status: AC | PRN
  Administered 2015-10-19: 10 mL
  Filled 2015-10-19: qty 10

## 2015-10-19 NOTE — Progress Notes (Signed)
Per Dr. Julien Nordmann, it's OK to give one unit of PRBC instead of two.

## 2015-10-19 NOTE — Patient Instructions (Signed)
Blood Transfusion, Care After  These instructions give you information about caring for yourself after your procedure. Your doctor may also give you more specific instructions. Call your doctor if you have any problems or questions after your procedure.   HOME CARE   Take medicines only as told by your doctor. Ask your doctor if you can take an over-the-counter pain reliever if you have a fever or headache a day or two after your procedure.   Return to your normal activities as told by your doctor.  GET HELP IF:    You develop redness or irritation at your IV site.   You have a fever, chills, or a headache that does not go away.   Your pee (urine) is darker than normal.   Your urine turns:    Pink.    Red.    Brown.   The white part of your eye turns yellow (jaundice).   You feel weak after doing your normal activities.  GET HELP RIGHT AWAY IF:    You have trouble breathing.   You have fever and chills and you also have:    Anxiety.    Chest or back pain.    Flushed or pink skin.    Clammy or sweaty skin.    A fast heartbeat.    A sick feeling in your stomach (nausea).     This information is not intended to replace advice given to you by your health care provider. Make sure you discuss any questions you have with your health care provider.     Document Released: 11/19/2014 Document Reviewed: 11/19/2014  Elsevier Interactive Patient Education 2016 [employer]

## 2015-10-20 ENCOUNTER — Telehealth: Payer: Self-pay | Admitting: *Deleted

## 2015-10-20 ENCOUNTER — Encounter (HOSPITAL_COMMUNITY): Payer: Self-pay

## 2015-10-20 ENCOUNTER — Ambulatory Visit (HOSPITAL_COMMUNITY)
Admission: RE | Admit: 2015-10-20 | Discharge: 2015-10-20 | Disposition: A | Discharge status: Home / Self Care | Payer: Medicare Other | Source: Ambulatory Visit | Attending: Internal Medicine | Admitting: Internal Medicine

## 2015-10-20 ENCOUNTER — Ambulatory Visit (HOSPITAL_BASED_OUTPATIENT_CLINIC_OR_DEPARTMENT_OTHER): Payer: Medicare Other | Admitting: Nurse Practitioner

## 2015-10-20 VITALS — BP 142/65 | HR 69 | Temp 98.8°F | Resp 18

## 2015-10-20 DIAGNOSIS — R599 Enlarged lymph nodes, unspecified: Secondary | ICD-10-CM | POA: Insufficient documentation

## 2015-10-20 DIAGNOSIS — R21 Rash and other nonspecific skin eruption: Secondary | ICD-10-CM

## 2015-10-20 DIAGNOSIS — D6959 Other secondary thrombocytopenia: Secondary | ICD-10-CM

## 2015-10-20 DIAGNOSIS — D649 Anemia, unspecified: Secondary | ICD-10-CM | POA: Diagnosis not present

## 2015-10-20 DIAGNOSIS — C3491 Malignant neoplasm of unspecified part of right bronchus or lung: Secondary | ICD-10-CM | POA: Diagnosis not present

## 2015-10-20 DIAGNOSIS — T451X5A Adverse effect of antineoplastic and immunosuppressive drugs, initial encounter: Secondary | ICD-10-CM

## 2015-10-20 DIAGNOSIS — C3490 Malignant neoplasm of unspecified part of unspecified bronchus or lung: Secondary | ICD-10-CM | POA: Diagnosis not present

## 2015-10-20 LAB — TYPE AND SCREEN
ABO/RH(D): A POS
Antibody Screen: NEGATIVE
UNIT DIVISION: 0

## 2015-10-20 MED ORDER — IOHEXOL 300 MG/ML  SOLN
75.0000 mL | Freq: Once | INTRAMUSCULAR | Status: AC | PRN
  Administered 2015-10-20: 75 mL via INTRAVENOUS

## 2015-10-20 NOTE — Telephone Encounter (Signed)
Pt came in to office to report pruritic rash that developed overnight. Reports it is on chest and bilateral arms. Pt took Benadryl with relief of itching. Discussed with Drue Second, NP: Pt will be worked in to symptom management clinic.

## 2015-10-21 ENCOUNTER — Telehealth: Payer: Self-pay | Admitting: *Deleted

## 2015-10-21 ENCOUNTER — Ambulatory Visit: Payer: Medicare Other

## 2015-10-21 ENCOUNTER — Telehealth: Payer: Self-pay | Admitting: Nurse Practitioner

## 2015-10-21 ENCOUNTER — Encounter: Payer: Self-pay | Admitting: Nurse Practitioner

## 2015-10-21 DIAGNOSIS — D6959 Other secondary thrombocytopenia: Secondary | ICD-10-CM | POA: Insufficient documentation

## 2015-10-21 DIAGNOSIS — T451X5A Adverse effect of antineoplastic and immunosuppressive drugs, initial encounter: Secondary | ICD-10-CM

## 2015-10-21 NOTE — Telephone Encounter (Signed)
LM to advise pt he would be seen on 12/12 after lab appt to check on rash. Calling also to check in on status following derm appt yesterday.

## 2015-10-21 NOTE — Assessment & Plan Note (Signed)
Patient received his last cycle of chemotherapy on 09/26/2015.  Blood counts obtained on 10/17/2015.  Did refill a platelet count of 41.  Patient's chronic rash to his bilateral forearms are noted to have some petechial bruising and scratched areas.  No evidence of active infection noted.  Patient denies any other issues with easy bleeding or bruising.  Patient will return on Monday the super 12th 2016 for repeat labs.

## 2015-10-21 NOTE — Progress Notes (Signed)
SYMPTOM MANAGEMENT CLINIC   HPI: Andrew Shaw 69 y.o. male diagnosed with small cell lung cancer.  Currently undergoing carboplatin/etoposide/Neulasta chemotherapy regimen.  Patient states that he has a chronic rash to his bilateral arms and his chest.  He states that his rash has slightly progressed within the last few days; and he has been scratching at it.  On exam.-It does appear the patient has some mild erythema of his bilateral forearms, upper arms, and his chest area.  Patient has multiple areas of healed scars to his arms as well.  Patient states that he has had multiple skin cancers removed was arms in the past.  It does appear the patient has been scratching at his arms; and he has some petechial bruising at the areas were patient has been scratching.  Patient has an appointment with his dermatologist later this afternoon for further evaluation of his rash.  Both sent with the patient to present to his dermatology appointment.  HPI  ROS  Past Medical History  Diagnosis Date  . Coronary artery disease     2D ECHO, 10/31/2010 - EF >55%, normal; NUCLEAR STRESS TEST, 10/23/2010 - perfusion defect in inferior myocardial region, post-stress EF 62%, EKG negative for ischemia  . Hypertension   . Hyperlipemia   . Chronic back pain   . DVT (deep venous thrombosis) ([place])     history 2004 after knee surg  . BPH (benign prostatic hyperplasia)   . Arthritis   . Neuromuscular disorder ([place])   . Carpal tunnel syndrome   . 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 GERD (gastroesophageal reflux disease)   . Myocardial infarction ([place]) 2005    from steroids  . S/P angioplasty with stent, BMS to LCX  12/23/12 12/23/2012  . Back pain 12/23/2012  . Shortness of breath     with exertion  . COPD (chronic obstructive pulmonary disease) ([place])   . Carpal tunnel syndrome, right   . Skin rash 08/22/2015    Past Surgical History  Procedure Laterality Date  . Knee arthroscopy   04,06    left  . Joint replacement Left 2008    lt total knee  . Manipulation knee joint Left 2009    closed lt knee   . Hernia repair  2008    umb   . Cervical fusion  1999  . Back surgery  2004    lumb fusion  . Epidural block injection      multiple lumbar  . Carpal tunnel release  04/15/2012    Procedure: CARPAL TUNNEL RELEASE;  Surgeon: Wynonia Sours, MD;  Location: [facility];  Service: Orthopedics;  Laterality: Left;  . Trigger finger release  04/15/2012    Procedure: RELEASE TRIGGER FINGER/A-1 PULLEY;  Surgeon: Wynonia Sours, MD;  Location: [facility];  Service: Orthopedics;  Laterality: Left;  left thumb and little finger  . Cardiac catheterization  12/23/2012    Mid nondominant AV groove circumflex stented with a 2.5x32m Mini Vision stent resulting in a reduction of 90% stenosis to 0% residual  . Cardiac catheterization  02/07/2004    Noncritical CAD, continue medical therapy  . Cardiac catheterization  02/03/1999    Recommended medical therapy  . Left heart catheterization with coronary angiogram N/A 12/23/2012    Procedure: LEFT HEART CATHETERIZATION WITH CORONARY ANGIOGRAM;  Surgeon: JLorretta Harp MD;  [facility];  Service: Cardiovascular;  Laterality: N/A;  . Percutaneous coronary  stent intervention (pci-s)  12/23/2012    Procedure: PERCUTANEOUS CORONARY STENT INTERVENTION (PCI-S);  Surgeon: Lorretta Harp, MD;  Location: [facility] CATH LAB;  Service: Cardiovascular;;  . Left heart catheterization with coronary angiogram N/A 01/11/2015    Procedure: LEFT HEART CATHETERIZATION WITH CORONARY ANGIOGRAM;  Surgeon: Peter M Martinique, MD;  Location: [facility] CATH LAB;  Service: Cardiovascular;  Laterality: N/A;  . Colonoscopy w/ polypectomy    . Video bronchoscopy with endobronchial ultrasound N/A 07/28/2015    Procedure: VIDEO BRONCHOSCOPY WITH ENDOBRONCHIAL ULTRASOUND;  Surgeon: [name], MD;  Location: [place];  Service: Thoracic;  Laterality:  N/A;    has CAD (coronary artery disease), cath 2005 with non obstructive disease, now 12/2102 with LCX stenosis culprit vessel; Unstable angina ([place]); Dyslipidemia; HTN (hypertension); Tobacco abuse; S/P angioplasty with stent, BMS to LCX  12/23/12; Back pain, followed at [facility] pain clinic; Carpal tunnel syndrome of right wrist, may need surgery in near future; Pain in the chest; Smoking history; Mediastinal adenopathy; Abnormal CT scan, chest; Other emphysema (HCC); ILD (interstitial lung disease) ([place]); Sinus drainage; Small cell carcinoma of lung ([place]); Skin rash; Encounter for antineoplastic chemotherapy; and Chemotherapy induced thrombocytopenia on his problem list.    is allergic to cortisone; latex; lisinopril; other; antihistamines, chlorpheniramine-type; and flexeril.    Medication List       This list is accurate as of: 10/20/15 11:59 PM.  Always use your most recent med list.               aspirin EC 81 MG tablet  Take 81 mg by mouth daily.     fenofibrate 48 MG tablet  Commonly known as:  TRICOR  Take 48 mg by mouth daily.     fluticasone 50 MCG/ACT nasal spray  Commonly known as:  FLONASE  Place 2 sprays into both nostrils daily.     HYDROcodone-acetaminophen 10-325 MG tablet  Commonly known as:  NORCO  Take 1 tablet by mouth every 4 (four) hours as needed (breakthrough pain).     irbesartan 150 MG tablet  Commonly known as:  AVAPRO  Take 1 tablet (150 mg total) by mouth daily.     lidocaine-prilocaine cream  Commonly known as:  EMLA  Apply one application to port a cath 1-2 hours prior to access.     metoprolol 50 MG tablet  Commonly known as:  LOPRESSOR  TAKE 1 TABLET BY MOUTH TWICE DAILY     mometasone 0.1 % cream  Commonly known as:  ELOCON  Apply 1 application topically daily as needed (for rash).     neomycin-bacitracin-polymyxin ointment  Commonly known as:  NEOSPORIN  Apply 1 application topically daily.     nitroGLYCERIN 0.4 MG SL tablet    Commonly known as:  NITROSTAT  Place 1 tablet (0.4 mg total) under the tongue every 5 (five) minutes x 3 doses as needed for chest pain.     OXYCONTIN 20 mg 12 hr tablet  Generic drug:  oxyCODONE  Take 20 mg by mouth every 8 (eight) hours.     prochlorperazine 10 MG tablet  Commonly known as:  COMPAZINE  Take 1 tablet (10 mg total) by mouth every 6 (six) hours as needed for nausea or vomiting.     rosuvastatin 20 MG tablet  Commonly known as:  CRESTOR  Take 20 mg by mouth daily.     tiotropium 18 MCG inhalation capsule  Commonly known as:  SPIRIVA  Place 1 capsule (18 mcg total) into  inhaler and inhale daily.         PHYSICAL EXAMINATION  Oncology Vitals 10/20/2015 10/19/2015  Height - -  Weight - -  Weight (lbs) - -  BMI (kg/m2) - -  Temp 98.8 97.8  Pulse 69 68  Resp 18 18  SpO2 100 99  BSA (m2) - -   BP Readings from Last 2 Encounters:  10/20/15 142/65  10/19/15 123/57    Physical Exam  Constitutional: He is oriented to person, place, and time and well-developed, well-nourished, and in no distress.  HENT:  Head: Normocephalic and atraumatic.  Eyes: Conjunctivae and EOM are normal. Pupils are equal, round, and reactive to light. Right eye exhibits no discharge. Left eye exhibits no discharge. No scleral icterus.  Neck: Normal range of motion.  Pulmonary/Chest: Effort normal. No respiratory distress.  Musculoskeletal: Normal range of motion. He exhibits no edema or tenderness.  Neurological: He is alert and oriented to person, place, and time. Gait normal.  Skin: Skin is warm and dry. Rash noted. There is erythema. No pallor.  On exam.-It does appear the patient has some mild erythema of his bilateral forearms, upper arms, and his chest area.  Patient has multiple areas of healed scars to his arms as well.  Patient states that he has had multiple skin cancers removed was arms in the past.  It does appear the patient has been scratching at his arms; and he has some  petechial bruising at the areas were patient has been scratching.    Psychiatric: Affect normal.  Nursing note and vitals reviewed.   LABORATORY DATA:. Orders Only on 10/18/2015  Component Date Value Ref Range Status  . ABO/RH(D) 10/18/2015 A POS   Final  . Antibody Screen 10/18/2015 NEG   Final  . Sample Expiration 10/18/2015 10/21/2015   Final  . Unit Number 10/18/2015 [id number]   Final  . Blood Component Type 10/18/2015 RED CELLS,LR   Final  . Unit division 10/18/2015 00   Final  . Status of Unit 10/18/2015 ISSUED,FINAL   Final  . Transfusion Status 10/18/2015 OK TO TRANSFUSE   Final  . Crossmatch Result 10/18/2015 Compatible   Final   RADIOGRAPHIC STUDIES: Ct Chest W Contrast  10/20/2015  CLINICAL DATA:  Restaging lung cancer. Initial diagnosis June 2016. Chemotherapy in progress. EXAM: CT CHEST WITH CONTRAST TECHNIQUE: Multidetector CT imaging of the chest was performed during intravenous contrast administration. CONTRAST:  22m OMNIPAQUE IOHEXOL 300 MG/ML  SOLN COMPARISON:  CT scan 09/23/2015 and PET-CT 08/03/2015 FINDINGS: Mediastinum/Nodes: No chest wall mass, supraclavicular or axillary lymphadenopathy. Small scattered lymph nodes are noted. The largest node in the left axilla measures 7.5 mm and is unchanged. A right-sided Port-A-Cath is noted. No complicating features. Stable small left thyroid lobe nodule. The heart is normal in size. No pericardial effusion. The aorta is normal in caliber. No dissection. Stable atherosclerotic calcifications. Stable coronary artery calcifications. Stable small scattered mediastinal and hilar lymph nodes. Prevascular lymph node on image number 21 measures 6.5 mm and is unchanged. 8 mm right paratracheal node on image 23 is stable also. 7 mm subcarinal lymph node on image 32 previously measured 10 mm. Lungs/Pleura: Stable mild emphysematous changes. No pulmonary lesions or metastatic pulmonary lung nodules. Dependent bibasilar subpleural  atelectasis. No infiltrates or effusions. Upper abdomen: No significant findings. No evidence of metastatic disease. Musculoskeletal: No significant findings. No worrisome bone lesions. IMPRESSION: 1. Stable small scattered mediastinal and hilar lymph nodes when compared to most recent CT scan. No  new adenopathy. 2. No pulmonary lesions or acute pulmonary findings. Electronically Signed   By: Marijo Sanes M.D.   On: 10/20/2015 14:29    ASSESSMENT/PLAN:    Small cell carcinoma of lung ([place]) Patient received cycle 3, day 1 of his carboplatin/etoposide/Neulasta chemotherapy regimen on 09/26/2015.  Cycle for the same regimen was held on December 5, secondary to chemotherapy-induced pancytopenia.  Patient presented to the [facility] today with complaint of chronic/progressive rash area.  Blood counts obtained on 10/17/2015 revealed a WBC of 3.1, ANC 2.3, hemoglobin 7.6, and platelet count 41.  Patient received 1 unit of blood transfusion yesterday, 10/19/2015.  Please see notes for further details.  Also, patient underwent a restaging CT of the chest earlier today.  Patient is scheduled to return for labs and cycle 4 of his chemotherapy on 10/24/2015.  Will add a visit to review scan results for that same day.    Skin rash Patient states that he has a chronic rash to his bilateral arms and his chest.  He states that his rash has slightly progressed within the last few days; and he has been scratching at it.  On exam.-It does appear the patient has some mild erythema of his bilateral forearms, upper arms, and his chest area.  Patient has multiple areas of healed scars to his arms as well.  Patient states that he has had multiple skin cancers removed was arms in the past.  It does appear the patient has been scratching at his arms; and he has some petechial bruising at the areas were patient has been scratching.  Patient has an appointment with his dermatologist later this afternoon for  further evaluation of his rash.  Both sent with the patient to present to his dermatology appointment.    Chemotherapy induced thrombocytopenia Patient received his last cycle of chemotherapy on 09/26/2015.  Blood counts obtained on 10/17/2015.  Did refill a platelet count of 41.  Patient's chronic rash to his bilateral forearms are noted to have some petechial bruising and scratched areas.  No evidence of active infection noted.  Patient denies any other issues with easy bleeding or bruising.  Patient will return on Monday the super 12th 2016 for repeat labs.  Patient stated understanding of all instructions; and was in agreement with this plan of care. The patient knows to call the clinic with any problems, questions or concerns.   Review/collaboration with Dr. Julien Nordmann regarding all aspects of patient's visit today.   Total time spent with patient was 15 minutes;  with greater than 75 percent of that time spent in face to face counseling regarding patient's symptoms,  and coordination of care and follow up.  Disclaimer:This dictation was prepared with Dragon/digital dictation along with [employer]. Any transcriptional errors that result from this process are unintentional.  Drue Second, NP 10/21/2015

## 2015-10-21 NOTE — Assessment & Plan Note (Signed)
Patient received cycle 3, day 1 of his carboplatin/etoposide/Neulasta chemotherapy regimen on 09/26/2015.  Cycle for the same regimen was held on December 5, secondary to chemotherapy-induced pancytopenia.  Patient presented to the [facility] today with complaint of chronic/progressive rash area.  Blood counts obtained on 10/17/2015 revealed a WBC of 3.1, ANC 2.3, hemoglobin 7.6, and platelet count 41.  Patient received 1 unit of blood transfusion yesterday, 10/19/2015.  Please see notes for further details.  Also, patient underwent a restaging CT of the chest earlier today.  Patient is scheduled to return for labs and cycle 4 of his chemotherapy on 10/24/2015.  Will add a visit to review scan results for that same day.

## 2015-10-21 NOTE — Telephone Encounter (Signed)
Called patient and he is aware of his appointments

## 2015-10-21 NOTE — Assessment & Plan Note (Signed)
Patient states that he has a chronic rash to his bilateral arms and his chest.  He states that his rash has slightly progressed within the last few days; and he has been scratching at it.  On exam.-It does appear the patient has some mild erythema of his bilateral forearms, upper arms, and his chest area.  Patient has multiple areas of healed scars to his arms as well.  Patient states that he has had multiple skin cancers removed was arms in the past.  It does appear the patient has been scratching at his arms; and he has some petechial bruising at the areas were patient has been scratching.  Patient has an appointment with his dermatologist later this afternoon for further evaluation of his rash.  Both sent with the patient to present to his dermatology appointment.

## 2015-10-24 ENCOUNTER — Encounter: Payer: Self-pay | Admitting: Nurse Practitioner

## 2015-10-24 ENCOUNTER — Telehealth: Payer: Self-pay | Admitting: Nurse Practitioner

## 2015-10-24 ENCOUNTER — Ambulatory Visit (HOSPITAL_BASED_OUTPATIENT_CLINIC_OR_DEPARTMENT_OTHER): Payer: Medicare Other

## 2015-10-24 ENCOUNTER — Ambulatory Visit (HOSPITAL_BASED_OUTPATIENT_CLINIC_OR_DEPARTMENT_OTHER): Payer: Medicare Other | Admitting: Nurse Practitioner

## 2015-10-24 ENCOUNTER — Ambulatory Visit: Payer: Medicare Other

## 2015-10-24 ENCOUNTER — Other Ambulatory Visit (HOSPITAL_BASED_OUTPATIENT_CLINIC_OR_DEPARTMENT_OTHER): Payer: Medicare Other

## 2015-10-24 ENCOUNTER — Telehealth: Payer: Self-pay | Admitting: *Deleted

## 2015-10-24 ENCOUNTER — Other Ambulatory Visit: Payer: Medicare Other

## 2015-10-24 VITALS — BP 121/64 | HR 65 | Temp 97.8°F

## 2015-10-24 DIAGNOSIS — R21 Rash and other nonspecific skin eruption: Secondary | ICD-10-CM | POA: Diagnosis not present

## 2015-10-24 DIAGNOSIS — Z95828 Presence of other vascular implants and grafts: Secondary | ICD-10-CM

## 2015-10-24 DIAGNOSIS — D61810 Antineoplastic chemotherapy induced pancytopenia: Secondary | ICD-10-CM

## 2015-10-24 DIAGNOSIS — C3491 Malignant neoplasm of unspecified part of right bronchus or lung: Secondary | ICD-10-CM

## 2015-10-24 DIAGNOSIS — C3490 Malignant neoplasm of unspecified part of unspecified bronchus or lung: Secondary | ICD-10-CM | POA: Diagnosis present

## 2015-10-24 DIAGNOSIS — T451X5A Adverse effect of antineoplastic and immunosuppressive drugs, initial encounter: Secondary | ICD-10-CM

## 2015-10-24 LAB — CBC WITH DIFFERENTIAL/PLATELET
BASO%: 0.5 % (ref 0.0–2.0)
BASOS ABS: 0 10*3/uL (ref 0.0–0.1)
EOS%: 1 % (ref 0.0–7.0)
Eosinophils Absolute: 0 10*3/uL (ref 0.0–0.5)
HEMATOCRIT: 28.1 % — AB (ref 38.4–49.9)
HEMOGLOBIN: 9.3 g/dL — AB (ref 13.0–17.1)
LYMPH#: 0.7 10*3/uL — AB (ref 0.9–3.3)
LYMPH%: 34.3 % (ref 14.0–49.0)
MCH: 32.6 pg (ref 27.2–33.4)
MCHC: 33.1 g/dL (ref 32.0–36.0)
MCV: 98.6 fL — ABNORMAL HIGH (ref 79.3–98.0)
MONO#: 0.7 10*3/uL (ref 0.1–0.9)
MONO%: 32.4 % — ABNORMAL HIGH (ref 0.0–14.0)
NEUT#: 0.7 10*3/uL — ABNORMAL LOW (ref 1.5–6.5)
NEUT%: 31.8 % — AB (ref 39.0–75.0)
PLATELETS: 98 10*3/uL — AB (ref 140–400)
RBC: 2.85 10*6/uL — ABNORMAL LOW (ref 4.20–5.82)
RDW: 23 % — AB (ref 11.0–14.6)
WBC: 2 10*3/uL — ABNORMAL LOW (ref 4.0–10.3)

## 2015-10-24 LAB — COMPREHENSIVE METABOLIC PANEL
ALBUMIN: 3.5 g/dL (ref 3.5–5.0)
ALT: 14 U/L (ref 0–55)
ANION GAP: 7 meq/L (ref 3–11)
AST: 31 U/L (ref 5–34)
Alkaline Phosphatase: 71 U/L (ref 40–150)
BILIRUBIN TOTAL: 0.4 mg/dL (ref 0.20–1.20)
BUN: 5.4 mg/dL — AB (ref 7.0–26.0)
CALCIUM: 9.1 mg/dL (ref 8.4–10.4)
CHLORIDE: 107 meq/L (ref 98–109)
CO2: 22 mEq/L (ref 22–29)
CREATININE: 1 mg/dL (ref 0.7–1.3)
EGFR: 76 mL/min/{1.73_m2} — ABNORMAL LOW (ref >90)
Glucose: 98 mg/dl (ref 70–140)
Potassium: 4.2 mEq/L (ref 3.5–5.1)
Sodium: 136 mEq/L (ref 136–145)
Total Protein: 6.8 g/dL (ref 6.4–8.3)

## 2015-10-24 MED ORDER — HEPARIN SOD (PORK) LOCK FLUSH 100 UNIT/ML IV SOLN
500.0000 [IU] | Freq: Once | INTRAVENOUS | Status: AC | PRN
  Administered 2015-10-24: 500 [IU]
  Filled 2015-10-24: qty 5

## 2015-10-24 MED ORDER — SODIUM CHLORIDE 0.9 % IJ SOLN
10.0000 mL | INTRAMUSCULAR | Status: DC | PRN
  Administered 2015-10-24: 10 mL via INTRAVENOUS
  Filled 2015-10-24: qty 10

## 2015-10-24 MED ORDER — SODIUM CHLORIDE 0.9 % IJ SOLN
10.0000 mL | INTRAMUSCULAR | Status: DC | PRN
  Administered 2015-10-24: 10 mL
  Filled 2015-10-24: qty 10

## 2015-10-24 NOTE — Telephone Encounter (Signed)
per pof to sch pt appt-gave pt copy of avs-sent MW email to sch trmt-will call pt after reply

## 2015-10-24 NOTE — Assessment & Plan Note (Signed)
Patient received cycle 3, day 1 of his carboplatin/etoposide/Neulasta chemotherapy regimen on 09/26/2015.  Cycle 4 of the same regimen was held on 10/17/15 secondary to chemotherapy-induced pancytopenia.  Patient received 1 unit of blood on 10/19/2015.  Patient underwent a restaging CT on 10/20/2015 which revealed:  IMPRESSION: 1. Stable small scattered mediastinal and hilar lymph nodes when compared to most recent CT scan. No new adenopathy. 2. No pulmonary lesions or acute pulmonary findings.  Patient presented to the [facility] today to receive cycle 4 of his chemotherapy regimen.  However, blood counts obtained today did reveal pancytopenia with WBCs 2.0, ANC 0.7, hemoglobin 9.3, and platelet count 98. Brief review of all neutropenia guidelines with the patient today.  The plan is for the patient to hold his chemotherapy today; and to return next Monday, 10/31/2015 for labs, flushed, visit, and his next chemotherapy.

## 2015-10-24 NOTE — Telephone Encounter (Signed)
Per staff message and POF I have scheduled appts. Advised scheduler of appts. JMW  

## 2015-10-24 NOTE — Assessment & Plan Note (Signed)
Patient received cycle 3, day 1 of his carboplatin/etoposide/Neulasta chemotherapy regimen on 09/26/2015.  He had cycle 4 of his chemotherapy held on 10/17/2015 secondary to pancytopenia.  His platelet count at that time was down to 41.  On recheck today.-Platelet count has improved to 98.  Hemoglobin improved at 9.3.  However, patient is neutropenic today with an ANC of 0.7.  Patient denies any recent fevers or chills; and was afebrile while at the [facility] today.  Agent was reminded of all neutropenia guidelines today.  Will hold chemotherapy again today.  Patient will return in one week on 10/31/2015 for labs, flush, visit, and chemotherapy.  Patient was advised to call/return to go directly to the emergency department for any worsening symptoms or fever/chills.

## 2015-10-24 NOTE — Patient Instructions (Signed)
Neutropenia Neutropenia is a condition that occurs when the level of a certain type of white blood cell (neutrophil) in your body becomes lower than normal. Neutrophils are made in the bone marrow and fight infections. These cells protect against bacteria and viruses. The fewer neutrophils you have, and the longer your body remains without them, the greater your risk of getting a severe infection becomes. CAUSES  The cause of neutropenia may be hard to determine. However, it is usually due to 3 main problems:   Decreased production of neutrophils. This may be due to:  Certain medicines such as chemotherapy.  Genetic problems.  Cancer.  Radiation treatments.  Vitamin deficiency.  Some pesticides.  Increased destruction of neutrophils. This may be due to:  Overwhelming infections.  Hemolytic anemia. This is when the body destroys its own blood cells.  Chemotherapy.  Neutrophils moving to areas of the body where they cannot fight infections. This may be due to:  Dialysis procedures.  Conditions where the spleen becomes enlarged. Neutrophils are held in the spleen and are not available to the rest of the body.  Overwhelming infections. The neutrophils are held in the area of the infection and are not available to the rest of the body. SYMPTOMS  There are no specific symptoms of neutropenia. The lack of neutrophils can result in an infection, and an infection can cause various problems. DIAGNOSIS  Diagnosis is made by a blood test. A complete blood count is performed. The normal level of neutrophils in human blood differs with age and race. Infants have lower counts than older children and adults. African Americans have lower counts than Caucasians or Asians. The average adult level is 1500 cells/mm3 of blood. Neutrophil counts are interpreted as follows:  Greater than 1000 cells/mm3 gives normal protection against infection.  500 to 1000 cells/mm3 gives an increased risk for  infection.  200 to 500 cells/mm3 is a greater risk for severe infection.  Lower than 200 cells/mm3 is a marked risk of infection. This may require hospitalization and treatment with antibiotic medicines. TREATMENT  Treatment depends on the underlying cause, severity, and presence of infections or symptoms. It also depends on your health. Your caregiver will discuss the treatment plan with you. Mild cases are often easily treated and have a good outcome. Preventative measures may also be started to limit your risk of infections. Treatment can include:  Taking antibiotics.  Stopping medicines that are known to cause neutropenia.  Correcting nutritional deficiencies by eating green vegetables to supply folic acid and taking vitamin B supplements.  Stopping exposure to pesticides if your neutropenia is related to pesticide exposure.  Taking a blood growth factor called sargramostim, pegfilgrastim, or filgrastim if you are undergoing chemotherapy for cancer. This stimulates white blood cell production.  Removal of the spleen if you have Felty's syndrome and have repeated infections. HOME CARE INSTRUCTIONS   Follow your caregiver's instructions about when you need to have blood work done.  Wash your hands often. Make sure others who come in contact with you also wash their hands.  Wash raw fruits and vegetables before eating them. They can carry bacteria and fungi.  Avoid people with colds or spreadable (contagious) diseases (chickenpox, herpes zoster, influenza).  Avoid large crowds.  Avoid construction areas. The dust can release fungus into the air.  Be cautious around children in daycare or school environments.  Take care of your respiratory system by coughing and deep breathing.  Bathe daily.  Protect your skin from cuts and   burns.  Do not work in the garden or with flowers and plants.  Care for the mouth before and after meals by brushing with a soft toothbrush. If you have  mucositis, do not use mouthwash. Mouthwash contains alcohol and can dry out the mouth even more.  Clean the area between the genitals and the anus (perineal area) after urination and bowel movements. Women need to wipe from front to back.  Use a water soluble lubricant during sexual intercourse and practice good hygiene after. Do not have intercourse if you are severely neutropenic. Check with your caregiver for guidelines.  Exercise daily as tolerated.  Avoid people who were vaccinated with a live vaccine in the past 30 days. You should not receive live vaccines (polio, typhoid).  Do not provide direct care for pets. Avoid animal droppings. Do not clean litter boxes and bird cages.  Do not share food utensils.  Do not use tampons, enemas, or rectal suppositories unless directed by your caregiver.  Use an electric razor to remove hair.  Wash your hands after handling magazines, letters, and newspapers. SEEK IMMEDIATE MEDICAL CARE IF:   You have a fever.  You have chills or start to shake.  You feel nauseous or vomit.  You develop mouth sores.  You develop aches and pains.  You have redness and swelling around open wounds.  Your skin is warm to the touch.  You have pus coming from your wounds.  You develop swollen lymph nodes.  You feel weak or fatigued.  You develop red streaks on the skin. MAKE SURE YOU:  Understand these instructions.  Will watch your condition.  Will get help right away if you are not doing well or get worse.   This information is not intended to replace advice given to you by your health care provider. Make sure you discuss any questions you have with your health care provider.   Document Released: 04/20/2002 Document Revised: 01/21/2012 Document Reviewed: 05/11/2015 Elsevier Interactive Patient Education [employer].

## 2015-10-24 NOTE — Progress Notes (Signed)
SYMPTOM MANAGEMENT CLINIC   HPI: Andrew Shaw 69 y.o. male diagnosed with small cell lung cancer.  Currently undergoing carboplatin/etoposide/Neulasta chemotherapy regimen.  Patient presented to the [facility] today in hopes of receiving cycle 4 of his chemotherapy regimen.  Chemotherapy therapy was held last week due to pancytopenia with platelet count down to 41.  Also, patient has been experiencing a chronic rash to his bilateral forearms; which he felt had increased.  Patient saw his dermatologist on Friday, 10/21/2015; and received a steroidal cream for treatment of his rash.  The petechial areas to his forearms secondary to scratching have essentially resolved.  Patient denies any recent fevers or chills.  HPI  ROS  Past Medical History  Diagnosis Date  . Coronary artery disease     2D ECHO, 10/31/2010 - EF >55%, normal; NUCLEAR STRESS TEST, 10/23/2010 - perfusion defect in inferior myocardial region, post-stress EF 62%, EKG negative for ischemia  . Hypertension   . Hyperlipemia   . Chronic back pain   . DVT (deep venous thrombosis) ([place])     history 2004 after knee surg  . BPH (benign prostatic hyperplasia)   . Arthritis   . Neuromuscular disorder ([place])   . Carpal tunnel syndrome   . 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 GERD (gastroesophageal reflux disease)   . Myocardial infarction ([place]) 2005    from steroids  . S/P angioplasty with stent, BMS to LCX  12/23/12 12/23/2012  . Back pain 12/23/2012  . Shortness of breath     with exertion  . COPD (chronic obstructive pulmonary disease) ([place])   . Carpal tunnel syndrome, right   . Skin rash 08/22/2015    Past Surgical History  Procedure Laterality Date  . Knee arthroscopy  04,06    left  . Joint replacement Left 2008    lt total knee  . Manipulation knee joint Left 2009    closed lt knee   . Hernia repair  2008    umb   . Cervical fusion  1999  . Back surgery  2004    lumb fusion  . Epidural  block injection      multiple lumbar  . Carpal tunnel release  04/15/2012    Procedure: CARPAL TUNNEL RELEASE;  Surgeon: Wynonia Sours, MD;  Location: [facility];  Service: Orthopedics;  Laterality: Left;  . Trigger finger release  04/15/2012    Procedure: RELEASE TRIGGER FINGER/A-1 PULLEY;  Surgeon: Wynonia Sours, MD;  Location: [facility];  Service: Orthopedics;  Laterality: Left;  left thumb and little finger  . Cardiac catheterization  12/23/2012    Mid nondominant AV groove circumflex stented with a 2.5x52m Mini Vision stent resulting in a reduction of 90% stenosis to 0% residual  . Cardiac catheterization  02/07/2004    Noncritical CAD, continue medical therapy  . Cardiac catheterization  02/03/1999    Recommended medical therapy  . Left heart catheterization with coronary angiogram N/A 12/23/2012    Procedure: LEFT HEART CATHETERIZATION WITH CORONARY ANGIOGRAM;  Surgeon: JLorretta Harp MD;  [facility];  Service: Cardiovascular;  Laterality: N/A;  . Percutaneous coronary stent intervention (pci-s)  12/23/2012    Procedure: PERCUTANEOUS CORONARY STENT INTERVENTION (PCI-S);  Surgeon: JLorretta Harp MD;  [facility];  Service: Cardiovascular;;  . Left heart catheterization with coronary angiogram N/A 01/11/2015    Procedure: LEFT HEART CATHETERIZATION WITH CORONARY ANGIOGRAM;  Surgeon: PCollier Salina  M Martinique, MD;  Location: [facility] CATH LAB;  Service: Cardiovascular;  Laterality: N/A;  . Colonoscopy w/ polypectomy    . Video bronchoscopy with endobronchial ultrasound N/A 07/28/2015    Procedure: VIDEO BRONCHOSCOPY WITH ENDOBRONCHIAL ULTRASOUND;  Surgeon: [name], MD;  Location: [facility];  Service: Thoracic;  Laterality: N/A;    has CAD (coronary artery disease), cath 2005 with non obstructive disease, now 12/2102 with LCX stenosis culprit vessel; Unstable angina ([place]); Dyslipidemia; HTN (hypertension); Tobacco abuse; S/P angioplasty with stent, BMS to  LCX  12/23/12; Back pain, followed at [facility] pain clinic; Carpal tunnel syndrome of right wrist, may need surgery in near future; Pain in the chest; Smoking history; Mediastinal adenopathy; Abnormal CT scan, chest; Other emphysema (HCC); ILD (interstitial lung disease) ([place]); Sinus drainage; Small cell carcinoma of lung ([place]); Skin rash; Encounter for antineoplastic chemotherapy; and Antineoplastic chemotherapy induced pancytopenia ([place]) on his problem list.    is allergic to cortisone; latex; lisinopril; other; antihistamines, chlorpheniramine-type; and flexeril.    Medication List       This list is accurate as of: 10/24/15  4:32 PM.  Always use your most recent med list.               aspirin EC 81 MG tablet  Take 81 mg by mouth daily.     fenofibrate 48 MG tablet  Commonly known as:  TRICOR  Take 48 mg by mouth daily.     fluticasone 50 MCG/ACT nasal spray  Commonly known as:  FLONASE  Place 2 sprays into both nostrils daily.     HYDROcodone-acetaminophen 10-325 MG tablet  Commonly known as:  NORCO  Take 1 tablet by mouth every 4 (four) hours as needed (breakthrough pain).     irbesartan 150 MG tablet  Commonly known as:  AVAPRO  Take 1 tablet (150 mg total) by mouth daily.     lidocaine-prilocaine cream  Commonly known as:  EMLA  Apply one application to port a cath 1-2 hours prior to access.     metoprolol 50 MG tablet  Commonly known as:  LOPRESSOR  TAKE 1 TABLET BY MOUTH TWICE DAILY     mometasone 0.1 % cream  Commonly known as:  ELOCON  Apply 1 application topically daily as needed (for rash).     neomycin-bacitracin-polymyxin ointment  Commonly known as:  NEOSPORIN  Apply 1 application topically daily.     nitroGLYCERIN 0.4 MG SL tablet  Commonly known as:  NITROSTAT  Place 1 tablet (0.4 mg total) under the tongue every 5 (five) minutes x 3 doses as needed for chest pain.     OXYCONTIN 20 mg 12 hr tablet  Generic drug:  oxyCODONE  Take 20 mg by mouth  every 8 (eight) hours.     prochlorperazine 10 MG tablet  Commonly known as:  COMPAZINE  Take 1 tablet (10 mg total) by mouth every 6 (six) hours as needed for nausea or vomiting.     rosuvastatin 20 MG tablet  Commonly known as:  CRESTOR  Take 20 mg by mouth daily.     tiotropium 18 MCG inhalation capsule  Commonly known as:  SPIRIVA  Place 1 capsule (18 mcg total) into inhaler and inhale daily.         PHYSICAL EXAMINATION  Oncology Vitals 10/24/2015 10/20/2015  Height - -  Weight - -  Weight (lbs) - -  BMI (kg/m2) - -  Temp 97.8 98.8  Pulse 65 69  Resp - 18  SpO2 100 100  BSA (m2) - -   BP Readings from Last 2 Encounters:  10/24/15 121/64  10/20/15 142/65    Physical Exam  Constitutional: He is oriented to person, place, and time and well-developed, well-nourished, and in no distress.  HENT:  Head: Normocephalic and atraumatic.  Eyes: Conjunctivae and EOM are normal. Pupils are equal, round, and reactive to light. Right eye exhibits no discharge. Left eye exhibits no discharge. No scleral icterus.  Neck: Normal range of motion.  Pulmonary/Chest: Effort normal. No respiratory distress.  Musculoskeletal: Normal range of motion. He exhibits no edema or tenderness.  Neurological: He is alert and oriented to person, place, and time. Gait normal.  Skin: Skin is warm and dry. Rash noted. There is erythema. No pallor.  On exam.-It does appear the patient has some mild erythema of his bilateral forearms, upper arms, and his chest area.  Patient has multiple areas of healed scars to his arms as well.  Patient states that he has had multiple skin cancers removed was arms in the past.  It does appear the patient has been scratching at his arms; and he has some petechial bruising at the areas were patient has been scratching. ____________________________________  Update: Rash appears much improved since last week's exam.  Also, there is no evidence of petechial rash now.    Psychiatric: Affect normal.  Nursing note and vitals reviewed.   LABORATORY DATA:. Appointment on 10/24/2015  Component Date Value Ref Range Status  . WBC 10/24/2015 2.0* 4.0 - 10.3 10e3/uL Final  . NEUT# 10/24/2015 0.7* 1.5 - 6.5 10e3/uL Final  . HGB 10/24/2015 9.3* 13.0 - 17.1 g/dL Final  . HCT 10/24/2015 28.1* 38.4 - 49.9 % Final  . Platelets 10/24/2015 98* 140 - 400 10e3/uL Final  . MCV 10/24/2015 98.6* 79.3 - 98.0 fL Final  . MCH 10/24/2015 32.6  27.2 - 33.4 pg Final  . MCHC 10/24/2015 33.1  32.0 - 36.0 g/dL Final  . RBC 10/24/2015 2.85* 4.20 - 5.82 10e6/uL Final  . RDW 10/24/2015 23.0* 11.0 - 14.6 % Final  . lymph# 10/24/2015 0.7* 0.9 - 3.3 10e3/uL Final  . MONO# 10/24/2015 0.7  0.1 - 0.9 10e3/uL Final  . Eosinophils Absolute 10/24/2015 0.0  0.0 - 0.5 10e3/uL Final  . Basophils Absolute 10/24/2015 0.0  0.0 - 0.1 10e3/uL Final  . NEUT% 10/24/2015 31.8* 39.0 - 75.0 % Final  . LYMPH% 10/24/2015 34.3  14.0 - 49.0 % Final  . MONO% 10/24/2015 32.4* 0.0 - 14.0 % Final  . EOS% 10/24/2015 1.0  0.0 - 7.0 % Final  . BASO% 10/24/2015 0.5  0.0 - 2.0 % Final  . Sodium 10/24/2015 136  136 - 145 mEq/L Final  . Potassium 10/24/2015 4.2  3.5 - 5.1 mEq/L Final  . Chloride 10/24/2015 107  98 - 109 mEq/L Final  . CO2 10/24/2015 22  22 - 29 mEq/L Final  . Glucose 10/24/2015 98  70 - 140 mg/dl Final   Glucose reference range is for nonfasting patients. Fasting glucose reference range is 70- 100.  Marland Kitchen BUN 10/24/2015 5.4* 7.0 - 26.0 mg/dL Final  . Creatinine 10/24/2015 1.0  0.7 - 1.3 mg/dL Final  . Total Bilirubin 10/24/2015 0.40  0.20 - 1.20 mg/dL Final  . Alkaline Phosphatase 10/24/2015 71  40 - 150 U/L Final  . AST 10/24/2015 31  5 - 34 U/L Final  . ALT 10/24/2015 14  0 - 55 U/L Final  . Total Protein 10/24/2015 6.8  6.4 - 8.3 g/dL Final  . Albumin 10/24/2015 3.5  3.5 - 5.0 g/dL Final  . Calcium 10/24/2015 9.1  8.4 - 10.4 mg/dL Final  . Anion Gap 10/24/2015 7  3 - 11 mEq/L Final  . EGFR  10/24/2015 76* >90 ml/min/1.73 m2 Final   eGFR is calculated using the CKD-EPI Creatinine Equation (2009)   RADIOGRAPHIC STUDIES: No results found.  ASSESSMENT/PLAN:    Small cell carcinoma of lung ([place]) Patient received cycle 3, day 1 of his carboplatin/etoposide/Neulasta chemotherapy regimen on 09/26/2015.  Cycle 4 of the same regimen was held on 10/17/15 secondary to chemotherapy-induced pancytopenia.  Patient received 1 unit of blood on 10/19/2015.  Patient underwent a restaging CT on 10/20/2015 which revealed:  IMPRESSION: 1. Stable small scattered mediastinal and hilar lymph nodes when compared to most recent CT scan. No new adenopathy. 2. No pulmonary lesions or acute pulmonary findings.  Patient presented to the [facility] today to receive cycle 4 of his chemotherapy regimen.  However, blood counts obtained today did reveal pancytopenia with WBCs 2.0, ANC 0.7, hemoglobin 9.3, and platelet count 98. Brief review of all neutropenia guidelines with the patient today.  The plan is for the patient to hold his chemotherapy today; and to return next Monday, 10/31/2015 for labs, flushed, visit, and his next chemotherapy.                 Skin rash Patient states that he has a chronic rash to his bilateral arms and his chest.  He states that his rash has slightly progressed within the last few days; and he has been scratching at it.  On exam.-It does appear the patient has some mild erythema of his bilateral forearms, upper arms, and his chest area.  Patient has multiple areas of healed scars to his arms as well.  Patient states that he has had multiple skin cancers removed was arms in the past.  It does appear the patient has been scratching at his arms; and he has some petechial bruising at the areas were patient has been scratching.  Patient has an appointment with his dermatologist later this afternoon for further evaluation of his rash.  Labs from today sent with the  patient to present to his dermatology appointment. __________________________________________________________________  Update: Patient states that he did see his dermatologist on Friday, 10/21/2015.  He was prescribed a steroidal cream to apply to his chronic rash to his arms.  On exam today.-Patient's rash to bilateral forearms.  Does appear to be slowly resolving.  There is no further petechial rash noted.  No evidence of active infection.  Advised patient to continue with follow-up as planned with his dermatologist.      Antineoplastic chemotherapy induced pancytopenia ([place]) Patient received cycle 3, day 1 of his carboplatin/etoposide/Neulasta chemotherapy regimen on 09/26/2015.  He had cycle 4 of his chemotherapy held on 10/17/2015 secondary to pancytopenia.  His platelet count at that time was down to 41.  On recheck today.-Platelet count has improved to 98.  Hemoglobin improved at 9.3.  However, patient is neutropenic today with an ANC of 0.7.  Patient denies any recent fevers or chills; and was afebrile while at the [facility] today.  Agent was reminded of all neutropenia guidelines today.  Will hold chemotherapy again today.  Patient will return in one week on 10/31/2015 for labs, flush, visit, and chemotherapy.  Patient was advised to call/return to go directly to the emergency department for any worsening symptoms or fever/chills.  Patient stated understanding of all instructions; and was in agreement with this plan of care. The patient knows to call the clinic with any problems, questions or concerns.   Review/collaboration with Dr. Julien Nordmann regarding all aspects of patient's visit today.   Total time spent with patient was 25 minutes;  with greater than 75 percent of that time spent in face to face counseling regarding patient's symptoms,  and coordination of care and follow up.  Disclaimer:This dictation was prepared with Dragon/digital dictation along with [employer]. Any transcriptional errors that result from this process are unintentional.  Drue Second, NP 10/24/2015

## 2015-10-24 NOTE — Assessment & Plan Note (Signed)
Patient states that he has a chronic rash to his bilateral arms and his chest.  He states that his rash has slightly progressed within the last few days; and he has been scratching at it.  On exam.-It does appear the patient has some mild erythema of his bilateral forearms, upper arms, and his chest area.  Patient has multiple areas of healed scars to his arms as well.  Patient states that he has had multiple skin cancers removed was arms in the past.  It does appear the patient has been scratching at his arms; and he has some petechial bruising at the areas were patient has been scratching.  Patient has an appointment with his dermatologist later this afternoon for further evaluation of his rash.  Labs from today sent with the patient to present to his dermatology appointment. __________________________________________________________________  Update: Patient states that he did see his dermatologist on Friday, 10/21/2015.  He was prescribed a steroidal cream to apply to his chronic rash to his arms.  On exam today.-Patient's rash to bilateral forearms.  Does appear to be slowly resolving.  There is no further petechial rash noted.  No evidence of active infection.  Advised patient to continue with follow-up as planned with his dermatologist.

## 2015-10-24 NOTE — Progress Notes (Signed)
Reviewed labs. ANC = 0.7  Selena Lesser, NP and Dr. Julien Nordmann made aware. Chemo held today. To have labs repeated in 1 week.

## 2015-10-25 ENCOUNTER — Ambulatory Visit: Payer: Medicare Other

## 2015-10-26 ENCOUNTER — Ambulatory Visit: Payer: Medicare Other

## 2015-10-28 ENCOUNTER — Encounter: Payer: Self-pay | Admitting: Pharmacist

## 2015-10-28 ENCOUNTER — Encounter: Payer: Self-pay | Admitting: Internal Medicine

## 2015-10-28 ENCOUNTER — Ambulatory Visit: Payer: Medicare Other

## 2015-10-31 ENCOUNTER — Other Ambulatory Visit: Payer: Medicare Other

## 2015-10-31 ENCOUNTER — Other Ambulatory Visit (HOSPITAL_BASED_OUTPATIENT_CLINIC_OR_DEPARTMENT_OTHER): Payer: Medicare Other

## 2015-10-31 ENCOUNTER — Ambulatory Visit (HOSPITAL_BASED_OUTPATIENT_CLINIC_OR_DEPARTMENT_OTHER): Payer: Medicare Other | Admitting: Internal Medicine

## 2015-10-31 ENCOUNTER — Telehealth: Payer: Self-pay | Admitting: Internal Medicine

## 2015-10-31 ENCOUNTER — Ambulatory Visit (HOSPITAL_BASED_OUTPATIENT_CLINIC_OR_DEPARTMENT_OTHER): Payer: Medicare Other

## 2015-10-31 ENCOUNTER — Ambulatory Visit: Payer: Medicare Other

## 2015-10-31 ENCOUNTER — Encounter: Payer: Self-pay | Admitting: Internal Medicine

## 2015-10-31 VITALS — BP 149/58 | HR 93 | Temp 98.8°F | Resp 17 | Ht 65.0 in | Wt 188.7 lb

## 2015-10-31 DIAGNOSIS — Z95828 Presence of other vascular implants and grafts: Secondary | ICD-10-CM

## 2015-10-31 DIAGNOSIS — Z5111 Encounter for antineoplastic chemotherapy: Secondary | ICD-10-CM | POA: Diagnosis present

## 2015-10-31 DIAGNOSIS — C3491 Malignant neoplasm of unspecified part of right bronchus or lung: Secondary | ICD-10-CM

## 2015-10-31 DIAGNOSIS — R21 Rash and other nonspecific skin eruption: Secondary | ICD-10-CM | POA: Diagnosis not present

## 2015-10-31 DIAGNOSIS — C3490 Malignant neoplasm of unspecified part of unspecified bronchus or lung: Secondary | ICD-10-CM

## 2015-10-31 LAB — CBC WITH DIFFERENTIAL/PLATELET
BASO%: 0.2 % (ref 0.0–2.0)
Basophils Absolute: 0 10*3/uL (ref 0.0–0.1)
EOS ABS: 0 10*3/uL (ref 0.0–0.5)
EOS%: 0.6 % (ref 0.0–7.0)
HEMATOCRIT: 28.7 % — AB (ref 38.4–49.9)
HGB: 9.5 g/dL — ABNORMAL LOW (ref 13.0–17.1)
LYMPH%: 18.4 % (ref 14.0–49.0)
MCH: 32.9 pg (ref 27.2–33.4)
MCHC: 33.1 g/dL (ref 32.0–36.0)
MCV: 99.3 fL — ABNORMAL HIGH (ref 79.3–98.0)
MONO#: 1.2 10*3/uL — AB (ref 0.1–0.9)
MONO%: 22.7 % — AB (ref 0.0–14.0)
NEUT%: 58.1 % (ref 39.0–75.0)
NEUTROS ABS: 3 10*3/uL (ref 1.5–6.5)
NRBC: 0 % (ref 0–0)
PLATELETS: 156 10*3/uL (ref 140–400)
RBC: 2.89 10*6/uL — AB (ref 4.20–5.82)
RDW: 22.3 % — AB (ref 11.0–14.6)
WBC: 5.1 10*3/uL (ref 4.0–10.3)
lymph#: 0.9 10*3/uL (ref 0.9–3.3)

## 2015-10-31 LAB — COMPREHENSIVE METABOLIC PANEL
ALT: 16 U/L (ref 0–55)
ANION GAP: 8 meq/L (ref 3–11)
AST: 21 U/L (ref 5–34)
Albumin: 3.1 g/dL — ABNORMAL LOW (ref 3.5–5.0)
Alkaline Phosphatase: 63 U/L (ref 40–150)
BUN: 7.5 mg/dL (ref 7.0–26.0)
CHLORIDE: 102 meq/L (ref 98–109)
CO2: 20 meq/L — AB (ref 22–29)
Calcium: 9.1 mg/dL (ref 8.4–10.4)
Creatinine: 1 mg/dL (ref 0.7–1.3)
EGFR: 76 mL/min/{1.73_m2} — AB (ref >90)
Glucose: 124 mg/dl (ref 70–140)
Potassium: 4.2 mEq/L (ref 3.5–5.1)
SODIUM: 131 meq/L — AB (ref 136–145)
Total Bilirubin: 0.49 mg/dL (ref 0.20–1.20)
Total Protein: 6.7 g/dL (ref 6.4–8.3)

## 2015-10-31 MED ORDER — SODIUM CHLORIDE 0.9 % IV SOLN
100.0000 mg/m2 | Freq: Once | INTRAVENOUS | Status: AC
  Administered 2015-10-31: 200 mg via INTRAVENOUS
  Filled 2015-10-31: qty 10

## 2015-10-31 MED ORDER — SODIUM CHLORIDE 0.9 % IV SOLN
Freq: Once | INTRAVENOUS | Status: AC
  Administered 2015-10-31: 10:00:00 via INTRAVENOUS

## 2015-10-31 MED ORDER — HEPARIN SOD (PORK) LOCK FLUSH 100 UNIT/ML IV SOLN
500.0000 [IU] | Freq: Once | INTRAVENOUS | Status: AC | PRN
  Administered 2015-10-31: 500 [IU]
  Filled 2015-10-31: qty 5

## 2015-10-31 MED ORDER — SODIUM CHLORIDE 0.9 % IJ SOLN
10.0000 mL | INTRAMUSCULAR | Status: DC | PRN
  Administered 2015-10-31: 10 mL via INTRAVENOUS
  Filled 2015-10-31: qty 10

## 2015-10-31 MED ORDER — SODIUM CHLORIDE 0.9 % IV SOLN
Freq: Once | INTRAVENOUS | Status: AC
  Administered 2015-10-31: 10:00:00 via INTRAVENOUS
  Filled 2015-10-31: qty 8

## 2015-10-31 MED ORDER — SODIUM CHLORIDE 0.9 % IV SOLN
441.6000 mg | Freq: Once | INTRAVENOUS | Status: AC
  Administered 2015-10-31: 440 mg via INTRAVENOUS
  Filled 2015-10-31: qty 44

## 2015-10-31 MED ORDER — SODIUM CHLORIDE 0.9 % IJ SOLN
10.0000 mL | INTRAMUSCULAR | Status: DC | PRN
  Administered 2015-10-31: 10 mL
  Filled 2015-10-31: qty 10

## 2015-10-31 NOTE — Telephone Encounter (Signed)
per pof to sch pt appt-gve pt copy of avs

## 2015-10-31 NOTE — Progress Notes (Signed)
[facility] [phone]   [phone]  OFFICE PROGRESS NOTE  [name], [place] [address] [place] [address]  DIAGNOSIS: Extensive stage (TX, N2, M1b) small cell lung cancer in September 2016 presenting with right hilar and mediastinal lymphadenopathy as well as highly suspicious metastatic disease in the axilla bilaterally.   PRIOR THERAPY: None  CURRENT THERAPY: Systemic chemotherapy with carboplatin for AUC of 5 on day 1 and etoposide 120 MG/M2 on days one 2, 3 with Neulasta support on day 4. Status post 3 cycles.  INTERVAL HISTORY: BRODE SCULLEY 69 y.o. male returns to the clinic today for follow-up visit accompanied by his wife. He is feeling fine today except for mild fatigue. He also has several areas of macular skin rash on the upper chest and arms. He is followed by his dermatologist and currently on Elocon 0.1%. The patient denied having any significant bleeding issues. He denied having any chest pain, shortness of breath, cough or hemoptysis. He denied having any significant nausea or vomiting. No significant weight loss or night sweats. He has no fever or chills. He was supposed to start cycle #4 today.  MEDICAL HISTORY: Past Medical History  Diagnosis Date  . Coronary artery disease     2D ECHO, 10/31/2010 - EF >55%, normal; NUCLEAR STRESS TEST, 10/23/2010 - perfusion defect in inferior myocardial region, post-stress EF 62%, EKG negative for ischemia  . Hypertension   . Hyperlipemia   . Chronic back pain   . DVT (deep venous thrombosis) ([facility])     history 2004 after knee surg  . BPH (benign prostatic hyperplasia)   . Arthritis   . Neuromuscular disorder ([facility])   . Carpal tunnel syndrome   . 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 GERD (gastroesophageal reflux disease)   . Myocardial infarction ([facility]) 2005    from steroids  . S/P angioplasty with stent, BMS to LCX  12/23/12 12/23/2012  . Back pain  12/23/2012  . Shortness of breath     with exertion  . COPD (chronic obstructive pulmonary disease) ([facility])   . Carpal tunnel syndrome, right   . Skin rash 08/22/2015    ALLERGIES:  is allergic to cortisone; latex; lisinopril; other; antihistamines, chlorpheniramine-type; and flexeril.  MEDICATIONS:  Current Outpatient Prescriptions  Medication Sig Dispense Refill  . aspirin EC 81 MG tablet Take 81 mg by mouth daily.    . fenofibrate (TRICOR) 48 MG tablet Take 48 mg by mouth daily.    . fluticasone (FLONASE) 50 MCG/ACT nasal spray Place 2 sprays into both nostrils daily. (Patient taking differently: Place 2 sprays into both nostrils daily as needed for allergies. ) 16 g 2  . HYDROcodone-acetaminophen (NORCO) 10-325 MG per tablet Take 1 tablet by mouth every 4 (four) hours as needed (breakthrough pain).     . irbesartan (AVAPRO) 150 MG tablet Take 1 tablet (150 mg total) by mouth daily. 30 tablet 6  . lidocaine-prilocaine (EMLA) cream Apply one application to port a cath 1-2 hours prior to access. 30 g 2  . metoprolol (LOPRESSOR) 50 MG tablet TAKE 1 TABLET BY MOUTH TWICE DAILY (Patient taking differently: TAKE 50 MG BY MOUTH TWICE DAILY) 60 tablet 9  . mometasone (ELOCON) 0.1 % cream Apply 1 application topically daily as needed (for rash).   3  . neomycin-bacitracin-polymyxin (NEOSPORIN) ointment Apply 1 application topically daily.    . OXYCONTIN 20 MG T12A 12  hr tablet Take 20 mg by mouth every 8 (eight) hours.  0  . rosuvastatin (CRESTOR) 20 MG tablet Take 20 mg by mouth daily.    Marland Kitchen tiotropium (SPIRIVA) 18 MCG inhalation capsule Place 1 capsule (18 mcg total) into inhaler and inhale daily. 30 capsule 6  . nitroGLYCERIN (NITROSTAT) 0.4 MG SL tablet Place 1 tablet (0.4 mg total) under the tongue every 5 (five) minutes x 3 doses as needed for chest pain. (Patient not taking: Reported on 10/17/2015) 25 tablet 2  . prochlorperazine (COMPAZINE) 10 MG tablet Take 1 tablet (10 mg total) by mouth  every 6 (six) hours as needed for nausea or vomiting. (Patient not taking: Reported on 10/20/2015) 30 tablet 1   No current facility-administered medications for this visit.    SURGICAL HISTORY:  Past Surgical History  Procedure Laterality Date  . Knee arthroscopy  04,06    left  . Joint replacement Left 2008    lt total knee  . Manipulation knee joint Left 2009    closed lt knee   . Hernia repair  2008    umb   . Cervical fusion  1999  . Back surgery  2004    lumb fusion  . Epidural block injection      multiple lumbar  . Carpal tunnel release  04/15/2012    Procedure: CARPAL TUNNEL RELEASE;  Surgeon: Wynonia Sours, MD;  Location: [facility];  Service: Orthopedics;  Laterality: Left;  . Trigger finger release  04/15/2012    Procedure: RELEASE TRIGGER FINGER/A-1 PULLEY;  Surgeon: Wynonia Sours, MD;  Location: [facility];  Service: Orthopedics;  Laterality: Left;  left thumb and little finger  . Cardiac catheterization  12/23/2012    Mid nondominant AV groove circumflex stented with a 2.5x4m Mini Vision stent resulting in a reduction of 90% stenosis to 0% residual  . Cardiac catheterization  02/07/2004    Noncritical CAD, continue medical therapy  . Cardiac catheterization  02/03/1999    Recommended medical therapy  . Left heart catheterization with coronary angiogram N/A 12/23/2012    Procedure: LEFT HEART CATHETERIZATION WITH CORONARY ANGIOGRAM;  Surgeon: JLorretta Harp MD;  [facility];  Service: Cardiovascular;  Laterality: N/A;  . Percutaneous coronary stent intervention (pci-s)  12/23/2012    Procedure: PERCUTANEOUS CORONARY STENT INTERVENTION (PCI-S);  Surgeon: JLorretta Harp MD;  [facility];  Service: Cardiovascular;;  . Left heart catheterization with coronary angiogram N/A 01/11/2015    Procedure: LEFT HEART CATHETERIZATION WITH CORONARY ANGIOGRAM;  Surgeon: Peter M JMartinique MD;  [facility];  Service: Cardiovascular;   Laterality: N/A;  . Colonoscopy w/ polypectomy    . Video bronchoscopy with endobronchial ultrasound N/A 07/28/2015    Procedure: VIDEO BRONCHOSCOPY WITH ENDOBRONCHIAL ULTRASOUND;  Surgeon: SMelrose Nakayama MD;  [facility]  Service: Thoracic;  Laterality: N/A;    REVIEW OF SYSTEMS:  Constitutional: positive for fatigue Eyes: negative Ears, nose, mouth, throat, and face: negative Respiratory: negative Cardiovascular: negative Gastrointestinal: negative Genitourinary:negative Integument/breast: negative Hematologic/lymphatic: negative Musculoskeletal:negative Neurological: negative Behavioral/Psych: negative Endocrine: negative Allergic/Immunologic: negative   PHYSICAL EXAMINATION: General appearance: alert, cooperative, fatigued and no distress Head: Normocephalic, without obvious abnormality, atraumatic Neck: no adenopathy, no JVD, supple, symmetrical, trachea midline and thyroid not enlarged, symmetric, no tenderness/mass/nodules Lymph nodes: Cervical, supraclavicular, and axillary nodes normal. Resp: clear to auscultation bilaterally Back: symmetric, no curvature. ROM normal. No CVA tenderness. Cardio: regular rate and rhythm, S1, S2  normal, no murmur, click, rub or gallop GI: soft, non-tender; bowel sounds normal; no masses,  no organomegaly Extremities: extremities normal, atraumatic, no cyanosis or edema    ECOG PERFORMANCE STATUS: 1 - Symptomatic but completely ambulatory  Blood pressure 149/58, pulse 93, temperature 98.8 F (37.1 C), temperature source Oral, resp. rate 17, height '5\' 5"'$  (1.651 m), weight 188 lb 11.2 oz (85.594 kg), SpO2 98 %.  LABORATORY DATA: Lab Results  Component Value Date   WBC 5.1 10/31/2015   HGB 9.5* 10/31/2015   HCT 28.7* 10/31/2015   MCV 99.3* 10/31/2015   PLT 156 10/31/2015      Chemistry      Component Value Date/Time   NA 136 10/24/2015 1357   NA 137 10/17/2015 0855   K 4.2 10/24/2015 1357   K 4.0 10/17/2015 0855   CL  105 10/17/2015 0855   CO2 22 10/24/2015 1357   CO2 24 10/17/2015 0855   BUN 5.4* 10/24/2015 1357   BUN 9 10/17/2015 0855   CREATININE 1.0 10/24/2015 1357   CREATININE 1.18 10/17/2015 0855      Component Value Date/Time   CALCIUM 9.1 10/24/2015 1357   CALCIUM 9.1 10/17/2015 0855   ALKPHOS 71 10/24/2015 1357   ALKPHOS 73 10/17/2015 0855   AST 31 10/24/2015 1357   AST 22 10/17/2015 0855   ALT 14 10/24/2015 1357   ALT 12* 10/17/2015 0855   BILITOT 0.40 10/24/2015 1357   BILITOT 0.5 10/17/2015 0855       RADIOGRAPHIC STUDIES: Ct Chest W Contrast  10/20/2015  CLINICAL DATA:  Restaging lung cancer. Initial diagnosis June 2016. Chemotherapy in progress. EXAM: CT CHEST WITH CONTRAST TECHNIQUE: Multidetector CT imaging of the chest was performed during intravenous contrast administration. CONTRAST:  15m OMNIPAQUE IOHEXOL 300 MG/ML  SOLN COMPARISON:  CT scan 09/23/2015 and PET-CT 08/03/2015 FINDINGS: Mediastinum/Nodes: No chest wall mass, supraclavicular or axillary lymphadenopathy. Small scattered lymph nodes are noted. The largest node in the left axilla measures 7.5 mm and is unchanged. A right-sided Port-A-Cath is noted. No complicating features. Stable small left thyroid lobe nodule. The heart is normal in size. No pericardial effusion. The aorta is normal in caliber. No dissection. Stable atherosclerotic calcifications. Stable coronary artery calcifications. Stable small scattered mediastinal and hilar lymph nodes. Prevascular lymph node on image number 21 measures 6.5 mm and is unchanged. 8 mm right paratracheal node on image 23 is stable also. 7 mm subcarinal lymph node on image 32 previously measured 10 mm. Lungs/Pleura: Stable mild emphysematous changes. No pulmonary lesions or metastatic pulmonary lung nodules. Dependent bibasilar subpleural atelectasis. No infiltrates or effusions. Upper abdomen: No significant findings. No evidence of metastatic disease. Musculoskeletal: No significant  findings. No worrisome bone lesions. IMPRESSION: 1. Stable small scattered mediastinal and hilar lymph nodes when compared to most recent CT scan. No new adenopathy. 2. No pulmonary lesions or acute pulmonary findings. Electronically Signed   By: PMarijo SanesM.D.   On: 10/20/2015 14:29   Ir Fluoro Guide Cv Line Right  10/17/2015  CLINICAL DATA:  Lung cancer EXAM: TUNNEL POWER PORT PLACEMENT WITH SUBCUTANEOUS POCKET UTILIZING ULTRASOUND & FLOUROSCOPY FLUOROSCOPY TIME:  18 seconds MEDICATIONS AND MEDICAL HISTORY: Versed 4.5 mg, Fentanyl 50 mcg. Additional Medications: Ancef 2 g. ANESTHESIA/SEDATION: Moderate sedation time: 30 minutes CONTRAST:  None PROCEDURE: After written informed consent was obtained, patient was placed in the supine position on angiographic table. The right neck and chest was prepped and draped in a sterile fashion. Lidocaine was utilized for  local anesthesia. The right jugular vein was noted to be patent initially with ultrasound. Under sonographic guidance, a micropuncture needle was inserted into the right IJ vein (Ultrasound and fluoroscopic image documentation was performed). The needle was removed over an 018 wire which was exchanged for a Amplatz. This was advanced into the IVC. An 8-French dilator was advanced over the Amplatz. A small incision was made in the right upper chest over the anterior right second rib. Utilizing blunt dissection, a subcutaneous pocket was created in the caudal direction. The pocket was irrigated with a copious amount of sterile normal saline. The port catheter was tunneled from the chest incision, and out the neck incision. The reservoir was inserted into the subcutaneous pocket and secured with two 3-0 Ethilon stitches. A peel-away sheath was advanced over the Amplatz wire. The port catheter was cut to measure length and inserted through the peel-away sheath. The peel-away sheath was removed. The chest incision was closed with 3-0 Vicryl interrupted  stitches for the subcutaneous tissue and a running of 4-0 Vicryl subcuticular stitch for the skin. The neck incision was closed with a 4-0 Vicryl subcuticular stitch. Derma-bond was applied to both surgical incisions. The port reservoir was flushed and instilled with heparinized saline. No complications. FINDINGS: A right IJ vein Port-A-Cath is in place with its tip at the cavoatrial junction. COMPLICATIONS: None IMPRESSION: Successful 8 French right internal jugular vein power port placement with its tip at the SVC/RA junction. Electronically Signed   By: Marybelle Killings M.D.   On: 10/17/2015 15:07   Ir US Guide Vasc Access Right  10/17/2015  CLINICAL DATA:  Lung cancer EXAM: TUNNEL POWER PORT PLACEMENT WITH SUBCUTANEOUS POCKET UTILIZING ULTRASOUND & FLOUROSCOPY FLUOROSCOPY TIME:  18 seconds MEDICATIONS AND MEDICAL HISTORY: Versed 4.5 mg, Fentanyl 50 mcg. Additional Medications: Ancef 2 g. ANESTHESIA/SEDATION: Moderate sedation time: 30 minutes CONTRAST:  None PROCEDURE: After written informed consent was obtained, patient was placed in the supine position on angiographic table. The right neck and chest was prepped and draped in a sterile fashion. Lidocaine was utilized for local anesthesia. The right jugular vein was noted to be patent initially with ultrasound. Under sonographic guidance, a micropuncture needle was inserted into the right IJ vein (Ultrasound and fluoroscopic image documentation was performed). The needle was removed over an 018 wire which was exchanged for a Amplatz. This was advanced into the IVC. An 8-French dilator was advanced over the Amplatz. A small incision was made in the right upper chest over the anterior right second rib. Utilizing blunt dissection, a subcutaneous pocket was created in the caudal direction. The pocket was irrigated with a copious amount of sterile normal saline. The port catheter was tunneled from the chest incision, and out the neck incision. The reservoir was  inserted into the subcutaneous pocket and secured with two 3-0 Ethilon stitches. A peel-away sheath was advanced over the Amplatz wire. The port catheter was cut to measure length and inserted through the peel-away sheath. The peel-away sheath was removed. The chest incision was closed with 3-0 Vicryl interrupted stitches for the subcutaneous tissue and a running of 4-0 Vicryl subcuticular stitch for the skin. The neck incision was closed with a 4-0 Vicryl subcuticular stitch. Derma-bond was applied to both surgical incisions. The port reservoir was flushed and instilled with heparinized saline. No complications. FINDINGS: A right IJ vein Port-A-Cath is in place with its tip at the cavoatrial junction. COMPLICATIONS: None IMPRESSION: Successful 8 French right internal jugular vein power port placement  with its tip at the SVC/RA junction. Electronically Signed   By: Marybelle Killings M.D.   On: 10/17/2015 15:07    ASSESSMENT AND PLAN: This is a very pleasant 69 years old white male recently diagnosed with extensive stage small cell lung cancer currently undergoing systemic chemotherapy with carboplatin and etoposide status post 3 cycles and tolerating his treatment well except for pancytopenia.  Has significant fatigue secondary to chemotherapy-induced anemia. His recent CT scan of the chest showed no evidence for disease progression. I discussed the scan results with the patient and his wife. I recommended for him to proceed with cycle #4 today as scheduled. The patient would come back for follow-up visit in 3 weeks for reevaluation before starting cycle #5. For the skin rash, he will continue his routine follow-up visit and management by his dermatologist. The patient was advised to call immediately if he has any concerning symptoms in the interval. The patient voices understanding of current disease status and treatment options and is in agreement with the current care plan.  All questions were answered.  The patient knows to call the clinic with any problems, questions or concerns. We can certainly see the patient much sooner if necessary.  Disclaimer: This note was dictated with voice recognition software. Similar sounding words can inadvertently be transcribed and may not be corrected upon review.

## 2015-10-31 NOTE — Patient Instructions (Signed)

## 2015-10-31 NOTE — Patient Instructions (Signed)
[facility] Discharge Instructions for Patients Receiving Chemotherapy  Today you received the following chemotherapy agents Carboplatin/Etoposide.  To help prevent nausea and vomiting after your treatment, we encourage you to take your nausea medication as directed.   If you develop nausea and vomiting that is not controlled by your nausea medication, call the clinic.   BELOW ARE SYMPTOMS THAT SHOULD BE REPORTED IMMEDIATELY:  *FEVER GREATER THAN 100.5 F  *CHILLS WITH OR WITHOUT FEVER  NAUSEA AND VOMITING THAT IS NOT CONTROLLED WITH YOUR NAUSEA MEDICATION  *UNUSUAL SHORTNESS OF BREATH  *UNUSUAL BRUISING OR BLEEDING  TENDERNESS IN MOUTH AND THROAT WITH OR WITHOUT PRESENCE OF ULCERS  *URINARY PROBLEMS  *BOWEL PROBLEMS  UNUSUAL RASH Items with * indicate a potential emergency and should be followed up as soon as possible.  Feel free to call the clinic you have any questions or concerns. The clinic phone number is [phone].  Please show the [facility] at check-in to the Emergency Department and triage nurse.

## 2015-11-01 ENCOUNTER — Ambulatory Visit (HOSPITAL_BASED_OUTPATIENT_CLINIC_OR_DEPARTMENT_OTHER): Payer: Medicare Other

## 2015-11-01 VITALS — BP 128/65 | HR 70 | Temp 97.6°F | Resp 18

## 2015-11-01 DIAGNOSIS — C3491 Malignant neoplasm of unspecified part of right bronchus or lung: Secondary | ICD-10-CM

## 2015-11-01 DIAGNOSIS — Z5111 Encounter for antineoplastic chemotherapy: Secondary | ICD-10-CM

## 2015-11-01 MED ORDER — SODIUM CHLORIDE 0.9 % IV SOLN
Freq: Once | INTRAVENOUS | Status: AC
  Administered 2015-11-01: 15:00:00 via INTRAVENOUS
  Filled 2015-11-01: qty 4

## 2015-11-01 MED ORDER — SODIUM CHLORIDE 0.9 % IV SOLN
100.0000 mg/m2 | Freq: Once | INTRAVENOUS | Status: AC
  Administered 2015-11-01: 200 mg via INTRAVENOUS
  Filled 2015-11-01: qty 10

## 2015-11-01 MED ORDER — HEPARIN SOD (PORK) LOCK FLUSH 100 UNIT/ML IV SOLN
500.0000 [IU] | Freq: Once | INTRAVENOUS | Status: AC | PRN
  Administered 2015-11-01: 500 [IU]
  Filled 2015-11-01: qty 5

## 2015-11-01 MED ORDER — SODIUM CHLORIDE 0.9 % IJ SOLN
10.0000 mL | INTRAMUSCULAR | Status: DC | PRN
  Administered 2015-11-01: 10 mL
  Filled 2015-11-01: qty 10

## 2015-11-01 MED ORDER — SODIUM CHLORIDE 0.9 % IV SOLN
Freq: Once | INTRAVENOUS | Status: AC
  Administered 2015-11-01: 15:00:00 via INTRAVENOUS

## 2015-11-01 NOTE — Patient Instructions (Signed)
[facility] Discharge Instructions for Patients Receiving Chemotherapy  Today you received the following chemotherapy agents VP 16 (Etoposide)  To help prevent nausea and vomiting after your treatment, we encourage you to take your nausea medication as prescribed. If you develop nausea and vomiting that is not controlled by your nausea medication, call the clinic.   BELOW ARE SYMPTOMS THAT SHOULD BE REPORTED IMMEDIATELY:  *FEVER GREATER THAN 100.5 F  *CHILLS WITH OR WITHOUT FEVER  NAUSEA AND VOMITING THAT IS NOT CONTROLLED WITH YOUR NAUSEA MEDICATION  *UNUSUAL SHORTNESS OF BREATH  *UNUSUAL BRUISING OR BLEEDING  TENDERNESS IN MOUTH AND THROAT WITH OR WITHOUT PRESENCE OF ULCERS  *URINARY PROBLEMS  *BOWEL PROBLEMS  UNUSUAL RASH Items with * indicate a potential emergency and should be followed up as soon as possible.  Feel free to call the clinic you have any questions or concerns. The clinic phone number is [phone].  Please show the [facility] at check-in to the Emergency Department and triage nurse.

## 2015-11-02 ENCOUNTER — Ambulatory Visit (HOSPITAL_BASED_OUTPATIENT_CLINIC_OR_DEPARTMENT_OTHER): Payer: Medicare Other

## 2015-11-02 ENCOUNTER — Telehealth: Payer: Self-pay | Admitting: Internal Medicine

## 2015-11-02 VITALS — BP 130/59 | HR 75 | Temp 97.9°F | Resp 16

## 2015-11-02 DIAGNOSIS — Z5111 Encounter for antineoplastic chemotherapy: Secondary | ICD-10-CM

## 2015-11-02 DIAGNOSIS — C3491 Malignant neoplasm of unspecified part of right bronchus or lung: Secondary | ICD-10-CM

## 2015-11-02 MED ORDER — SODIUM CHLORIDE 0.9 % IJ SOLN
10.0000 mL | INTRAMUSCULAR | Status: DC | PRN
  Administered 2015-11-02: 10 mL
  Filled 2015-11-02: qty 10

## 2015-11-02 MED ORDER — SODIUM CHLORIDE 0.9 % IV SOLN
100.0000 mg/m2 | Freq: Once | INTRAVENOUS | Status: AC
  Administered 2015-11-02: 200 mg via INTRAVENOUS
  Filled 2015-11-02: qty 10

## 2015-11-02 MED ORDER — DEXAMETHASONE SODIUM PHOSPHATE 100 MG/10ML IJ SOLN
Freq: Once | INTRAMUSCULAR | Status: AC
  Administered 2015-11-02: 14:00:00 via INTRAVENOUS
  Filled 2015-11-02: qty 4

## 2015-11-02 MED ORDER — HEPARIN SOD (PORK) LOCK FLUSH 100 UNIT/ML IV SOLN
500.0000 [IU] | Freq: Once | INTRAVENOUS | Status: AC | PRN
  Administered 2015-11-02: 500 [IU]
  Filled 2015-11-02: qty 5

## 2015-11-02 MED ORDER — SODIUM CHLORIDE 0.9 % IV SOLN
Freq: Once | INTRAVENOUS | Status: AC
  Administered 2015-11-02: 14:00:00 via INTRAVENOUS

## 2015-11-02 NOTE — Telephone Encounter (Signed)
Appointments made and avs will be printed at todays chemo

## 2015-11-02 NOTE — Patient Instructions (Signed)
[facility] Discharge Instructions for Patients Receiving Chemotherapy  Today you received the following chemotherapy agents etoposide   To help prevent nausea and vomiting after your treatment, we encourage you to take your nausea medication as directed  If you develop nausea and vomiting that is not controlled by your nausea medication, call the clinic.   BELOW ARE SYMPTOMS THAT SHOULD BE REPORTED IMMEDIATELY:  *FEVER GREATER THAN 100.5 F  *CHILLS WITH OR WITHOUT FEVER  NAUSEA AND VOMITING THAT IS NOT CONTROLLED WITH YOUR NAUSEA MEDICATION  *UNUSUAL SHORTNESS OF BREATH  *UNUSUAL BRUISING OR BLEEDING  TENDERNESS IN MOUTH AND THROAT WITH OR WITHOUT PRESENCE OF ULCERS  *URINARY PROBLEMS  *BOWEL PROBLEMS  UNUSUAL RASH Items with * indicate a potential emergency and should be followed up as soon as possible.  Feel free to call the clinic you have any questions or concerns. The clinic phone number is [phone].  

## 2015-11-04 ENCOUNTER — Other Ambulatory Visit: Payer: Self-pay | Admitting: Medical Oncology

## 2015-11-04 ENCOUNTER — Ambulatory Visit (HOSPITAL_BASED_OUTPATIENT_CLINIC_OR_DEPARTMENT_OTHER): Payer: Medicare Other

## 2015-11-04 VITALS — BP 145/72 | HR 67 | Temp 97.7°F

## 2015-11-04 DIAGNOSIS — Z5189 Encounter for other specified aftercare: Secondary | ICD-10-CM | POA: Diagnosis not present

## 2015-11-04 DIAGNOSIS — C3491 Malignant neoplasm of unspecified part of right bronchus or lung: Secondary | ICD-10-CM

## 2015-11-04 MED ORDER — PEGFILGRASTIM INJECTION 6 MG/0.6ML ~~LOC~~
6.0000 mg | PREFILLED_SYRINGE | Freq: Once | SUBCUTANEOUS | Status: AC
  Administered 2015-11-04: 6 mg via SUBCUTANEOUS
  Filled 2015-11-04: qty 0.6

## 2015-11-08 ENCOUNTER — Ambulatory Visit: Payer: Medicare Other

## 2015-11-08 ENCOUNTER — Encounter: Payer: Self-pay | Admitting: Internal Medicine

## 2015-11-08 ENCOUNTER — Ambulatory Visit (HOSPITAL_BASED_OUTPATIENT_CLINIC_OR_DEPARTMENT_OTHER): Payer: Medicare Other | Admitting: Internal Medicine

## 2015-11-08 ENCOUNTER — Other Ambulatory Visit (HOSPITAL_BASED_OUTPATIENT_CLINIC_OR_DEPARTMENT_OTHER): Payer: Medicare Other

## 2015-11-08 VITALS — BP 128/61 | HR 80 | Temp 98.6°F | Resp 18 | Ht 65.0 in | Wt 183.9 lb

## 2015-11-08 DIAGNOSIS — R530 Neoplastic (malignant) related fatigue: Secondary | ICD-10-CM | POA: Diagnosis not present

## 2015-11-08 DIAGNOSIS — C3491 Malignant neoplasm of unspecified part of right bronchus or lung: Secondary | ICD-10-CM

## 2015-11-08 DIAGNOSIS — D61810 Antineoplastic chemotherapy induced pancytopenia: Secondary | ICD-10-CM | POA: Diagnosis not present

## 2015-11-08 DIAGNOSIS — D6481 Anemia due to antineoplastic chemotherapy: Secondary | ICD-10-CM | POA: Diagnosis not present

## 2015-11-08 DIAGNOSIS — Z5111 Encounter for antineoplastic chemotherapy: Secondary | ICD-10-CM

## 2015-11-08 LAB — CBC WITH DIFFERENTIAL/PLATELET
BASO%: 1.8 % (ref 0.0–2.0)
BASOS ABS: 0 10*3/uL (ref 0.0–0.1)
EOS%: 1.8 % (ref 0.0–7.0)
Eosinophils Absolute: 0 10*3/uL (ref 0.0–0.5)
HCT: 26.7 % — ABNORMAL LOW (ref 38.4–49.9)
HEMOGLOBIN: 8.9 g/dL — AB (ref 13.0–17.1)
LYMPH#: 0.5 10*3/uL — AB (ref 0.9–3.3)
LYMPH%: 96.4 % — AB (ref 14.0–49.0)
MCH: 33 pg (ref 27.2–33.4)
MCHC: 33.3 g/dL (ref 32.0–36.0)
MCV: 98.9 fL — AB (ref 79.3–98.0)
MONO#: 0 10*3/uL — ABNORMAL LOW (ref 0.1–0.9)
MONO%: 0 % (ref 0.0–14.0)
NEUT#: 0 10*3/uL — CL (ref 1.5–6.5)
NEUT%: 0 % — AB (ref 39.0–75.0)
Platelets: 38 10*3/uL — ABNORMAL LOW (ref 140–400)
RBC: 2.7 10*6/uL — ABNORMAL LOW (ref 4.20–5.82)
RDW: 20 % — ABNORMAL HIGH (ref 11.0–14.6)
WBC: 0.6 10*3/uL — CL (ref 4.0–10.3)
nRBC: 0 % (ref 0–0)

## 2015-11-08 LAB — COMPREHENSIVE METABOLIC PANEL
ALBUMIN: 3.2 g/dL — AB (ref 3.5–5.0)
ALK PHOS: 74 U/L (ref 40–150)
ALT: 17 U/L (ref 0–55)
AST: 16 U/L (ref 5–34)
Anion Gap: 8 mEq/L (ref 3–11)
BILIRUBIN TOTAL: 0.86 mg/dL (ref 0.20–1.20)
BUN: 13.2 mg/dL (ref 7.0–26.0)
CALCIUM: 9.5 mg/dL (ref 8.4–10.4)
CO2: 23 mEq/L (ref 22–29)
Chloride: 102 mEq/L (ref 98–109)
Creatinine: 1 mg/dL (ref 0.7–1.3)
EGFR: 81 mL/min/{1.73_m2} — AB (ref >90)
GLUCOSE: 109 mg/dL (ref 70–140)
Potassium: 4.3 mEq/L (ref 3.5–5.1)
SODIUM: 134 meq/L — AB (ref 136–145)
Total Protein: 7.1 g/dL (ref 6.4–8.3)

## 2015-11-08 MED ORDER — CIPROFLOXACIN HCL 500 MG PO TABS: 500.0000 mg | ORAL_TABLET | Freq: Two times a day (BID) | ORAL | Status: DC

## 2015-11-08 NOTE — Progress Notes (Signed)
[place] [phone]   [phone]  OFFICE PROGRESS NOTE  [name], [place] [address] [place] [address]  DIAGNOSIS: Extensive stage (TX, N2, M1b) small cell lung cancer in September 2016 presenting with right hilar and mediastinal lymphadenopathy as well as highly suspicious metastatic disease in the axilla bilaterally.   PRIOR THERAPY: None  CURRENT THERAPY: Systemic chemotherapy with carboplatin for AUC of 5 on day 1 and etoposide 120 MG/M2 on days one 2, 3 with Neulasta support on day 4. Status post 4 cycles.  INTERVAL HISTORY: Andrew Shaw 69 y.o. male returns to the clinic today for follow-up visit accompanied by his wife. He is feeling fine today except for mild fatigue as well as sore throat. He started cycle #4 of his chemotherapy last week. The patient denied having any significant bleeding issues. He denied having any chest pain, shortness of breath, cough or hemoptysis. He denied having any significant nausea or vomiting. No significant weight loss or night sweats. He has no fever or chills.   MEDICAL HISTORY: Past Medical History  Diagnosis Date  . Coronary artery disease     2D ECHO, 10/31/2010 - EF >55%, normal; NUCLEAR STRESS TEST, 10/23/2010 - perfusion defect in inferior myocardial region, post-stress EF 62%, EKG negative for ischemia  . Hypertension   . Hyperlipemia   . Chronic back pain   . DVT (deep venous thrombosis) ([place])     history 2004 after knee surg  . BPH (benign prostatic hyperplasia)   . Arthritis   . Neuromuscular disorder ([place])   . Carpal tunnel syndrome   . 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 GERD (gastroesophageal reflux disease)   . Myocardial infarction ([place]) 2005    from steroids  . S/P angioplasty with stent, BMS to LCX  12/23/12 12/23/2012  . Back pain 12/23/2012  . Shortness of breath     with exertion  . COPD (chronic obstructive pulmonary disease) ([place])   .  Carpal tunnel syndrome, right   . Skin rash 08/22/2015    ALLERGIES:  is allergic to cortisone; latex; lisinopril; other; antihistamines, chlorpheniramine-type; and flexeril.  MEDICATIONS:  Current Outpatient Prescriptions  Medication Sig Dispense Refill  . aspirin EC 81 MG tablet Take 81 mg by mouth daily.    . fenofibrate (TRICOR) 48 MG tablet Take 48 mg by mouth daily.    . fluticasone (FLONASE) 50 MCG/ACT nasal spray Place 2 sprays into both nostrils daily. (Patient taking differently: Place 2 sprays into both nostrils daily as needed for allergies. ) 16 g 2  . HYDROcodone-acetaminophen (NORCO) 10-325 MG per tablet Take 1 tablet by mouth every 4 (four) hours as needed (breakthrough pain).     . irbesartan (AVAPRO) 150 MG tablet Take 1 tablet (150 mg total) by mouth daily. 30 tablet 6  . lidocaine-prilocaine (EMLA) cream Apply one application to port a cath 1-2 hours prior to access. 30 g 2  . metoprolol (LOPRESSOR) 50 MG tablet TAKE 1 TABLET BY MOUTH TWICE DAILY (Patient taking differently: TAKE 50 MG BY MOUTH TWICE DAILY) 60 tablet 9  . mometasone (ELOCON) 0.1 % cream Apply 1 application topically daily as needed (for rash).   3  . neomycin-bacitracin-polymyxin (NEOSPORIN) ointment Apply 1 application topically daily.    . nitroGLYCERIN (NITROSTAT) 0.4 MG SL tablet Place 1 tablet (0.4 mg total) under the tongue every 5 (five) minutes x 3 doses as needed  for chest pain. 25 tablet 2  . OXYCONTIN 20 MG T12A 12 hr tablet Take 20 mg by mouth every 8 (eight) hours.  0  . prochlorperazine (COMPAZINE) 10 MG tablet Take 1 tablet (10 mg total) by mouth every 6 (six) hours as needed for nausea or vomiting. 30 tablet 1  . rosuvastatin (CRESTOR) 20 MG tablet Take 20 mg by mouth daily.    Marland Kitchen tiotropium (SPIRIVA) 18 MCG inhalation capsule Place 1 capsule (18 mcg total) into inhaler and inhale daily. 30 capsule 6   No current facility-administered medications for this visit.    SURGICAL HISTORY:    Past Surgical History  Procedure Laterality Date  . Knee arthroscopy  04,06    left  . Joint replacement Left 2008    lt total knee  . Manipulation knee joint Left 2009    closed lt knee   . Hernia repair  2008    umb   . Cervical fusion  1999  . Back surgery  2004    lumb fusion  . Epidural block injection      multiple lumbar  . Carpal tunnel release  04/15/2012    Procedure: CARPAL TUNNEL RELEASE;  Surgeon: Wynonia Sours, MD;  Location: [facility];  Service: Orthopedics;  Laterality: Left;  . Trigger finger release  04/15/2012    Procedure: RELEASE TRIGGER FINGER/A-1 PULLEY;  Surgeon: Wynonia Sours, MD;  Location: [place];  Service: Orthopedics;  Laterality: Left;  left thumb and little finger  . Cardiac catheterization  12/23/2012    Mid nondominant AV groove circumflex stented with a 2.5x26m Mini Vision stent resulting in a reduction of 90% stenosis to 0% residual  . Cardiac catheterization  02/07/2004    Noncritical CAD, continue medical therapy  . Cardiac catheterization  02/03/1999    Recommended medical therapy  . Left heart catheterization with coronary angiogram N/A 12/23/2012    Procedure: LEFT HEART CATHETERIZATION WITH CORONARY ANGIOGRAM;  Surgeon: JLorretta Harp MD;  [facility];  Service: Cardiovascular;  Laterality: N/A;  . Percutaneous coronary stent intervention (pci-s)  12/23/2012    Procedure: PERCUTANEOUS CORONARY STENT INTERVENTION (PCI-S);  Surgeon: JLorretta Harp MD;  [facility];  Service: Cardiovascular;;  . Left heart catheterization with coronary angiogram N/A 01/11/2015    Procedure: LEFT HEART CATHETERIZATION WITH CORONARY ANGIOGRAM;  Surgeon: Peter M JMartinique MD;  [facility];  Service: Cardiovascular;  Laterality: N/A;  . Colonoscopy w/ polypectomy    . Video bronchoscopy with endobronchial ultrasound N/A 07/28/2015    Procedure: VIDEO BRONCHOSCOPY WITH ENDOBRONCHIAL ULTRASOUND;  Surgeon: SMelrose Nakayama MD;  [facility]  Service: Thoracic;  Laterality: N/A;    REVIEW OF SYSTEMS:  A comprehensive review of systems was negative except for: Constitutional: positive for anorexia and fatigue Ears, nose, mouth, throat, and face: positive for sore mouth and sore throat   PHYSICAL EXAMINATION: General appearance: alert, cooperative, fatigued and no distress Head: Normocephalic, without obvious abnormality, atraumatic Neck: no adenopathy, no JVD, supple, symmetrical, trachea midline and thyroid not enlarged, symmetric, no tenderness/mass/nodules Lymph nodes: Cervical, supraclavicular, and axillary nodes normal. Resp: clear to auscultation bilaterally Back: symmetric, no curvature. ROM normal. No CVA tenderness. Cardio: regular rate and rhythm, S1, S2 normal, no murmur, click, rub or gallop GI: soft, non-tender; bowel sounds normal; no masses,  no organomegaly Extremities: extremities normal, atraumatic, no cyanosis or edema    ECOG PERFORMANCE STATUS: 1 - Symptomatic  but completely ambulatory  Blood pressure 128/61, pulse 80, temperature 98.6 F (37 C), temperature source Oral, resp. rate 18, height '5\' 5"'$  (1.651 m), weight 183 lb 14.4 oz (83.416 kg), SpO2 100 %.  LABORATORY DATA: Lab Results  Component Value Date   WBC 0.6* 11/08/2015   HGB 8.9* 11/08/2015   HCT 26.7* 11/08/2015   MCV 98.9* 11/08/2015   PLT 38* 11/08/2015      Chemistry      Component Value Date/Time   NA 134* 11/08/2015 1234   NA 137 10/17/2015 0855   K 4.3 11/08/2015 1234   K 4.0 10/17/2015 0855   CL 105 10/17/2015 0855   CO2 23 11/08/2015 1234   CO2 24 10/17/2015 0855   BUN 13.2 11/08/2015 1234   BUN 9 10/17/2015 0855   CREATININE 1.0 11/08/2015 1234   CREATININE 1.18 10/17/2015 0855      Component Value Date/Time   CALCIUM 9.5 11/08/2015 1234   CALCIUM 9.1 10/17/2015 0855   ALKPHOS 74 11/08/2015 1234   ALKPHOS 73 10/17/2015 0855   AST 16 11/08/2015 1234   AST 22 10/17/2015 0855    ALT 17 11/08/2015 1234   ALT 12* 10/17/2015 0855   BILITOT 0.86 11/08/2015 1234   BILITOT 0.5 10/17/2015 0855       RADIOGRAPHIC STUDIES: Ct Chest W Contrast  10/20/2015  CLINICAL DATA:  Restaging lung cancer. Initial diagnosis June 2016. Chemotherapy in progress. EXAM: CT CHEST WITH CONTRAST TECHNIQUE: Multidetector CT imaging of the chest was performed during intravenous contrast administration. CONTRAST:  36m OMNIPAQUE IOHEXOL 300 MG/ML  SOLN COMPARISON:  CT scan 09/23/2015 and PET-CT 08/03/2015 FINDINGS: Mediastinum/Nodes: No chest wall mass, supraclavicular or axillary lymphadenopathy. Small scattered lymph nodes are noted. The largest node in the left axilla measures 7.5 mm and is unchanged. A right-sided Port-A-Cath is noted. No complicating features. Stable small left thyroid lobe nodule. The heart is normal in size. No pericardial effusion. The aorta is normal in caliber. No dissection. Stable atherosclerotic calcifications. Stable coronary artery calcifications. Stable small scattered mediastinal and hilar lymph nodes. Prevascular lymph node on image number 21 measures 6.5 mm and is unchanged. 8 mm right paratracheal node on image 23 is stable also. 7 mm subcarinal lymph node on image 32 previously measured 10 mm. Lungs/Pleura: Stable mild emphysematous changes. No pulmonary lesions or metastatic pulmonary lung nodules. Dependent bibasilar subpleural atelectasis. No infiltrates or effusions. Upper abdomen: No significant findings. No evidence of metastatic disease. Musculoskeletal: No significant findings. No worrisome bone lesions. IMPRESSION: 1. Stable small scattered mediastinal and hilar lymph nodes when compared to most recent CT scan. No new adenopathy. 2. No pulmonary lesions or acute pulmonary findings. Electronically Signed   By: PMarijo SanesM.D.   On: 10/20/2015 14:29   Ir Fluoro Guide Cv Line Right  10/17/2015  CLINICAL DATA:  Lung cancer EXAM: TUNNEL POWER PORT PLACEMENT WITH  SUBCUTANEOUS POCKET UTILIZING ULTRASOUND & FLOUROSCOPY FLUOROSCOPY TIME:  18 seconds MEDICATIONS AND MEDICAL HISTORY: Versed 4.5 mg, Fentanyl 50 mcg. Additional Medications: Ancef 2 g. ANESTHESIA/SEDATION: Moderate sedation time: 30 minutes CONTRAST:  None PROCEDURE: After written informed consent was obtained, patient was placed in the supine position on angiographic table. The right neck and chest was prepped and draped in a sterile fashion. Lidocaine was utilized for local anesthesia. The right jugular vein was noted to be patent initially with ultrasound. Under sonographic guidance, a micropuncture needle was inserted into the right IJ vein (Ultrasound and fluoroscopic image documentation was performed). The  needle was removed over an 018 wire which was exchanged for a Amplatz. This was advanced into the IVC. An 8-French dilator was advanced over the Amplatz. A small incision was made in the right upper chest over the anterior right second rib. Utilizing blunt dissection, a subcutaneous pocket was created in the caudal direction. The pocket was irrigated with a copious amount of sterile normal saline. The port catheter was tunneled from the chest incision, and out the neck incision. The reservoir was inserted into the subcutaneous pocket and secured with two 3-0 Ethilon stitches. A peel-away sheath was advanced over the Amplatz wire. The port catheter was cut to measure length and inserted through the peel-away sheath. The peel-away sheath was removed. The chest incision was closed with 3-0 Vicryl interrupted stitches for the subcutaneous tissue and a running of 4-0 Vicryl subcuticular stitch for the skin. The neck incision was closed with a 4-0 Vicryl subcuticular stitch. Derma-bond was applied to both surgical incisions. The port reservoir was flushed and instilled with heparinized saline. No complications. FINDINGS: A right IJ vein Port-A-Cath is in place with its tip at the cavoatrial junction.  COMPLICATIONS: None IMPRESSION: Successful 8 French right internal jugular vein power port placement with its tip at the SVC/RA junction. Electronically Signed   By: Marybelle Killings M.D.   On: 10/17/2015 15:07   Ir US Guide Vasc Access Right  10/17/2015  CLINICAL DATA:  Lung cancer EXAM: TUNNEL POWER PORT PLACEMENT WITH SUBCUTANEOUS POCKET UTILIZING ULTRASOUND & FLOUROSCOPY FLUOROSCOPY TIME:  18 seconds MEDICATIONS AND MEDICAL HISTORY: Versed 4.5 mg, Fentanyl 50 mcg. Additional Medications: Ancef 2 g. ANESTHESIA/SEDATION: Moderate sedation time: 30 minutes CONTRAST:  None PROCEDURE: After written informed consent was obtained, patient was placed in the supine position on angiographic table. The right neck and chest was prepped and draped in a sterile fashion. Lidocaine was utilized for local anesthesia. The right jugular vein was noted to be patent initially with ultrasound. Under sonographic guidance, a micropuncture needle was inserted into the right IJ vein (Ultrasound and fluoroscopic image documentation was performed). The needle was removed over an 018 wire which was exchanged for a Amplatz. This was advanced into the IVC. An 8-French dilator was advanced over the Amplatz. A small incision was made in the right upper chest over the anterior right second rib. Utilizing blunt dissection, a subcutaneous pocket was created in the caudal direction. The pocket was irrigated with a copious amount of sterile normal saline. The port catheter was tunneled from the chest incision, and out the neck incision. The reservoir was inserted into the subcutaneous pocket and secured with two 3-0 Ethilon stitches. A peel-away sheath was advanced over the Amplatz wire. The port catheter was cut to measure length and inserted through the peel-away sheath. The peel-away sheath was removed. The chest incision was closed with 3-0 Vicryl interrupted stitches for the subcutaneous tissue and a running of 4-0 Vicryl subcuticular stitch for  the skin. The neck incision was closed with a 4-0 Vicryl subcuticular stitch. Derma-bond was applied to both surgical incisions. The port reservoir was flushed and instilled with heparinized saline. No complications. FINDINGS: A right IJ vein Port-A-Cath is in place with its tip at the cavoatrial junction. COMPLICATIONS: None IMPRESSION: Successful 8 French right internal jugular vein power port placement with its tip at the SVC/RA junction. Electronically Signed   By: Marybelle Killings M.D.   On: 10/17/2015 15:07    ASSESSMENT AND PLAN: This is a very pleasant 69 years old  white male recently diagnosed with extensive stage small cell lung cancer currently undergoing systemic chemotherapy with carboplatin and etoposide status post 4 cycles and tolerating his treatment well except for pancytopenia.  Has significant fatigue secondary to chemotherapy-induced anemia. He started cycle #4 last week. His absolute neutrophil count is 0 today. I started the patient on prophylactic antibiotics with Cipro 500 mg by mouth twice a day for 5 days. He'll receive Neulasta injection after his treatment. He was advised to go immediately to the emergency department if he develop any significant fever or chills. He will continue to have weekly lab. I would see the patient back for follow-up visit in 2 weeks for reevaluation before starting cycle #5. The patient was advised to call immediately if he has any concerning symptoms in the interval. The patient voices understanding of current disease status and treatment options and is in agreement with the current care plan.  All questions were answered. The patient knows to call the clinic with any problems, questions or concerns. We can certainly see the patient much sooner if necessary.  Disclaimer: This note was dictated with voice recognition software. Similar sounding words can inadvertently be transcribed and may not be corrected upon review.

## 2015-11-09 ENCOUNTER — Ambulatory Visit: Payer: Medicare Other

## 2015-11-10 ENCOUNTER — Telehealth: Payer: Self-pay | Admitting: *Deleted

## 2015-11-10 ENCOUNTER — Encounter: Payer: Self-pay | Admitting: Nurse Practitioner

## 2015-11-10 ENCOUNTER — Ambulatory Visit (HOSPITAL_BASED_OUTPATIENT_CLINIC_OR_DEPARTMENT_OTHER): Payer: Medicare Other | Admitting: Nurse Practitioner

## 2015-11-10 ENCOUNTER — Ambulatory Visit: Payer: Medicare Other

## 2015-11-10 ENCOUNTER — Inpatient Hospital Stay (HOSPITAL_COMMUNITY)
Admission: AD | Admit: 2015-11-10 | Discharge: 2015-11-16 | DRG: 809 | Disposition: A | Discharge status: Home / Self Care | Payer: Medicare Other | Source: Ambulatory Visit | Attending: Internal Medicine | Admitting: Internal Medicine

## 2015-11-10 ENCOUNTER — Other Ambulatory Visit: Payer: Self-pay | Admitting: *Deleted

## 2015-11-10 ENCOUNTER — Ambulatory Visit (HOSPITAL_BASED_OUTPATIENT_CLINIC_OR_DEPARTMENT_OTHER): Payer: Medicare Other

## 2015-11-10 ENCOUNTER — Encounter (HOSPITAL_COMMUNITY): Payer: Self-pay | Admitting: *Deleted

## 2015-11-10 VITALS — BP 165/74 | HR 113 | Temp 99.3°F | Resp 20

## 2015-11-10 DIAGNOSIS — E860 Dehydration: Secondary | ICD-10-CM

## 2015-11-10 DIAGNOSIS — Z809 Family history of malignant neoplasm, unspecified: Secondary | ICD-10-CM

## 2015-11-10 DIAGNOSIS — Z955 Presence of coronary angioplasty implant and graft: Secondary | ICD-10-CM

## 2015-11-10 DIAGNOSIS — D6959 Other secondary thrombocytopenia: Secondary | ICD-10-CM | POA: Diagnosis present

## 2015-11-10 DIAGNOSIS — C3490 Malignant neoplasm of unspecified part of unspecified bronchus or lung: Secondary | ICD-10-CM | POA: Diagnosis present

## 2015-11-10 DIAGNOSIS — K209 Esophagitis, unspecified without bleeding: Secondary | ICD-10-CM | POA: Diagnosis present

## 2015-11-10 DIAGNOSIS — Z79899 Other long term (current) drug therapy: Secondary | ICD-10-CM

## 2015-11-10 DIAGNOSIS — Z79891 Long term (current) use of opiate analgesic: Secondary | ICD-10-CM

## 2015-11-10 DIAGNOSIS — D709 Neutropenia, unspecified: Secondary | ICD-10-CM | POA: Diagnosis present

## 2015-11-10 DIAGNOSIS — I252 Old myocardial infarction: Secondary | ICD-10-CM

## 2015-11-10 DIAGNOSIS — J449 Chronic obstructive pulmonary disease, unspecified: Secondary | ICD-10-CM | POA: Diagnosis present

## 2015-11-10 DIAGNOSIS — N400 Enlarged prostate without lower urinary tract symptoms: Secondary | ICD-10-CM | POA: Diagnosis present

## 2015-11-10 DIAGNOSIS — L539 Erythematous condition, unspecified: Secondary | ICD-10-CM | POA: Diagnosis present

## 2015-11-10 DIAGNOSIS — K210 Gastro-esophageal reflux disease with esophagitis: Secondary | ICD-10-CM | POA: Diagnosis present

## 2015-11-10 DIAGNOSIS — Z91040 Latex allergy status: Secondary | ICD-10-CM | POA: Diagnosis not present

## 2015-11-10 DIAGNOSIS — R945 Abnormal results of liver function studies: Secondary | ICD-10-CM

## 2015-11-10 DIAGNOSIS — Z95820 Peripheral vascular angioplasty status with implants and grafts: Secondary | ICD-10-CM

## 2015-11-10 DIAGNOSIS — I2510 Atherosclerotic heart disease of native coronary artery without angina pectoris: Secondary | ICD-10-CM | POA: Diagnosis present

## 2015-11-10 DIAGNOSIS — D61810 Antineoplastic chemotherapy induced pancytopenia: Secondary | ICD-10-CM | POA: Diagnosis not present

## 2015-11-10 DIAGNOSIS — B370 Candidal stomatitis: Secondary | ICD-10-CM | POA: Diagnosis present

## 2015-11-10 DIAGNOSIS — K1230 Oral mucositis (ulcerative), unspecified: Secondary | ICD-10-CM | POA: Diagnosis present

## 2015-11-10 DIAGNOSIS — R5081 Fever presenting with conditions classified elsewhere: Secondary | ICD-10-CM | POA: Diagnosis present

## 2015-11-10 DIAGNOSIS — Z833 Family history of diabetes mellitus: Secondary | ICD-10-CM

## 2015-11-10 DIAGNOSIS — E876 Hypokalemia: Secondary | ICD-10-CM | POA: Diagnosis present

## 2015-11-10 DIAGNOSIS — R1310 Dysphagia, unspecified: Secondary | ICD-10-CM | POA: Diagnosis present

## 2015-11-10 DIAGNOSIS — M1990 Unspecified osteoarthritis, unspecified site: Secondary | ICD-10-CM | POA: Diagnosis present

## 2015-11-10 DIAGNOSIS — Z7982 Long term (current) use of aspirin: Secondary | ICD-10-CM | POA: Diagnosis not present

## 2015-11-10 DIAGNOSIS — M549 Dorsalgia, unspecified: Secondary | ICD-10-CM | POA: Diagnosis present

## 2015-11-10 DIAGNOSIS — T451X5A Adverse effect of antineoplastic and immunosuppressive drugs, initial encounter: Secondary | ICD-10-CM | POA: Diagnosis present

## 2015-11-10 DIAGNOSIS — F17210 Nicotine dependence, cigarettes, uncomplicated: Secondary | ICD-10-CM | POA: Diagnosis present

## 2015-11-10 DIAGNOSIS — Z981 Arthrodesis status: Secondary | ICD-10-CM

## 2015-11-10 DIAGNOSIS — G893 Neoplasm related pain (acute) (chronic): Secondary | ICD-10-CM | POA: Diagnosis present

## 2015-11-10 DIAGNOSIS — Z8249 Family history of ischemic heart disease and other diseases of the circulatory system: Secondary | ICD-10-CM | POA: Diagnosis not present

## 2015-11-10 DIAGNOSIS — Z86718 Personal history of other venous thrombosis and embolism: Secondary | ICD-10-CM

## 2015-11-10 DIAGNOSIS — E46 Unspecified protein-calorie malnutrition: Secondary | ICD-10-CM | POA: Diagnosis present

## 2015-11-10 DIAGNOSIS — R7989 Other specified abnormal findings of blood chemistry: Secondary | ICD-10-CM | POA: Diagnosis not present

## 2015-11-10 DIAGNOSIS — Z888 Allergy status to other drugs, medicaments and biological substances status: Secondary | ICD-10-CM | POA: Diagnosis not present

## 2015-11-10 DIAGNOSIS — E785 Hyperlipidemia, unspecified: Secondary | ICD-10-CM | POA: Diagnosis present

## 2015-11-10 DIAGNOSIS — I10 Essential (primary) hypertension: Secondary | ICD-10-CM | POA: Diagnosis not present

## 2015-11-10 DIAGNOSIS — J438 Other emphysema: Secondary | ICD-10-CM | POA: Diagnosis not present

## 2015-11-10 DIAGNOSIS — D6481 Anemia due to antineoplastic chemotherapy: Secondary | ICD-10-CM | POA: Diagnosis not present

## 2015-11-10 LAB — CBC WITH DIFFERENTIAL/PLATELET
BASO%: 0 % (ref 0.0–2.0)
Basophils Absolute: 0 10*3/uL (ref 0.0–0.1)
EOS ABS: 0 10*3/uL (ref 0.0–0.5)
EOS%: 0 % (ref 0.0–7.0)
HEMATOCRIT: 21.6 % — AB (ref 38.4–49.9)
HEMOGLOBIN: 7.4 g/dL — AB (ref 13.0–17.1)
LYMPH#: 0.2 10*3/uL — AB (ref 0.9–3.3)
LYMPH%: 76.7 % — AB (ref 14.0–49.0)
MCH: 33.5 pg — AB (ref 27.2–33.4)
MCHC: 34.3 g/dL (ref 32.0–36.0)
MCV: 97.7 fL (ref 79.3–98.0)
MONO#: 0 10*3/uL — AB (ref 0.1–0.9)
MONO%: 6.7 % (ref 0.0–14.0)
NEUT%: 16.6 % — AB (ref 39.0–75.0)
NEUTROS ABS: 0.1 10*3/uL — AB (ref 1.5–6.5)
PLATELETS: 5 10*3/uL — AB (ref 140–400)
RBC: 2.21 10*6/uL — ABNORMAL LOW (ref 4.20–5.82)
RDW: 19.4 % — ABNORMAL HIGH (ref 11.0–14.6)
WBC: 0.3 10*3/uL — CL (ref 4.0–10.3)
nRBC: 0 % (ref 0–0)

## 2015-11-10 LAB — COMPREHENSIVE METABOLIC PANEL
ALBUMIN: 3.1 g/dL — AB (ref 3.5–5.0)
ALK PHOS: 62 U/L (ref 40–150)
ALT: 12 U/L (ref 0–55)
AST: 8 U/L (ref 5–34)
Anion Gap: 10 mEq/L (ref 3–11)
BILIRUBIN TOTAL: 1.08 mg/dL (ref 0.20–1.20)
BUN: 29.7 mg/dL — AB (ref 7.0–26.0)
CO2: 19 mEq/L — ABNORMAL LOW (ref 22–29)
Calcium: 9.7 mg/dL (ref 8.4–10.4)
Chloride: 101 mEq/L (ref 98–109)
Creatinine: 1.3 mg/dL (ref 0.7–1.3)
EGFR: 55 mL/min/{1.73_m2} — AB (ref >90)
GLUCOSE: 132 mg/dL (ref 70–140)
Potassium: 4.5 mEq/L (ref 3.5–5.1)
SODIUM: 129 meq/L — AB (ref 136–145)
TOTAL PROTEIN: 7.5 g/dL (ref 6.4–8.3)

## 2015-11-10 LAB — PREPARE RBC (CROSSMATCH)

## 2015-11-10 MED ORDER — IPRATROPIUM BROMIDE 0.02 % IN SOLN: 0.5000 mg | Freq: Four times a day (QID) | RESPIRATORY_TRACT | Status: DC | PRN

## 2015-11-10 MED ORDER — ONDANSETRON HCL 4 MG PO TABS: 4.0000 mg | ORAL_TABLET | Freq: Four times a day (QID) | ORAL | Status: DC | PRN

## 2015-11-10 MED ORDER — FLUTICASONE PROPIONATE 50 MCG/ACT NA SUSP
2.0000 | Freq: Every day | NASAL | Status: DC | PRN
  Filled 2015-11-10: qty 16

## 2015-11-10 MED ORDER — VANCOMYCIN HCL 10 G IV SOLR
1500.0000 mg | Freq: Once | INTRAVENOUS | Status: AC
  Administered 2015-11-11: 1500 mg via INTRAVENOUS
  Filled 2015-11-10: qty 1500

## 2015-11-10 MED ORDER — TIOTROPIUM BROMIDE MONOHYDRATE 18 MCG IN CAPS
18.0000 ug | ORAL_CAPSULE | Freq: Every day | RESPIRATORY_TRACT | Status: DC
  Administered 2015-11-12 – 2015-11-14 (×3): 18 ug via RESPIRATORY_TRACT
  Filled 2015-11-10 (×2): qty 5

## 2015-11-10 MED ORDER — VANCOMYCIN HCL IN DEXTROSE 750-5 MG/150ML-% IV SOLN
750.0000 mg | Freq: Two times a day (BID) | INTRAVENOUS | Status: DC
  Filled 2015-11-10: qty 150

## 2015-11-10 MED ORDER — SODIUM CHLORIDE 0.9 % IV BOLUS (SEPSIS)
1000.0000 mL | Freq: Once | INTRAVENOUS | Status: AC
  Administered 2015-11-10: 1000 mL via INTRAVENOUS

## 2015-11-10 MED ORDER — ONDANSETRON HCL 4 MG/2ML IJ SOLN
4.0000 mg | Freq: Four times a day (QID) | INTRAMUSCULAR | Status: DC | PRN
Start: 2015-11-10 — End: 2015-11-17

## 2015-11-10 MED ORDER — HYDROMORPHONE HCL 4 MG/ML IJ SOLN
2.0000 mg | Freq: Once | INTRAMUSCULAR | Status: AC
  Administered 2015-11-10: 2 mg via INTRAVENOUS

## 2015-11-10 MED ORDER — SODIUM CHLORIDE 0.9 % IV SOLN
Freq: Once | INTRAVENOUS | Status: AC
  Administered 2015-11-10: 15:00:00 via INTRAVENOUS

## 2015-11-10 MED ORDER — LEVALBUTEROL HCL 1.25 MG/0.5ML IN NEBU
1.2500 mg | INHALATION_SOLUTION | Freq: Four times a day (QID) | RESPIRATORY_TRACT | Status: DC | PRN
  Filled 2015-11-10: qty 0.5

## 2015-11-10 MED ORDER — BACITRACIN-NEOMYCIN-POLYMYXIN 400-5-5000 EX OINT
1.0000 "application " | TOPICAL_OINTMENT | Freq: Every day | CUTANEOUS | Status: DC
  Administered 2015-11-12 – 2015-11-16 (×5): 1 via TOPICAL
  Filled 2015-11-10 (×2): qty 1

## 2015-11-10 MED ORDER — FENTANYL 25 MCG/HR TD PT72
25.0000 ug | MEDICATED_PATCH | TRANSDERMAL | Status: DC
Start: 2015-11-10 — End: 2015-11-17
  Administered 2015-11-10 – 2015-11-13 (×2): 25 ug via TRANSDERMAL
  Filled 2015-11-10 (×2): qty 1

## 2015-11-10 MED ORDER — NITROGLYCERIN 0.4 MG SL SUBL: 0.4000 mg | SUBLINGUAL_TABLET | SUBLINGUAL | Status: DC | PRN

## 2015-11-10 MED ORDER — MAGIC MOUTHWASH W/LIDOCAINE
15.0000 mL | Freq: Four times a day (QID) | ORAL | Status: DC | PRN
  Administered 2015-11-12 – 2015-11-13 (×4): 15 mL via ORAL
  Filled 2015-11-10 (×7): qty 15

## 2015-11-10 MED ORDER — HYDROMORPHONE HCL 1 MG/ML IJ SOLN
1.0000 mg | INTRAMUSCULAR | Status: DC | PRN
  Administered 2015-11-10 – 2015-11-11 (×4): 1 mg via INTRAVENOUS
  Filled 2015-11-10 (×4): qty 1

## 2015-11-10 MED ORDER — NYSTATIN 100000 UNIT/ML MT SUSP
5.0000 mL | Freq: Four times a day (QID) | OROMUCOSAL | Status: DC
  Administered 2015-11-10 – 2015-11-16 (×23): 500000 [IU] via ORAL
  Filled 2015-11-10 (×21): qty 5

## 2015-11-10 MED ORDER — SODIUM CHLORIDE 0.9 % IV SOLN
Freq: Once | INTRAVENOUS | Status: AC
  Administered 2015-11-10: 16:00:00 via INTRAVENOUS

## 2015-11-10 MED ORDER — DEXTROSE 5 % IV SOLN
2.0000 g | Freq: Three times a day (TID) | INTRAVENOUS | Status: DC
  Administered 2015-11-10 – 2015-11-16 (×17): 2 g via INTRAVENOUS
  Filled 2015-11-10 (×22): qty 2

## 2015-11-10 MED ORDER — SODIUM CHLORIDE 0.9 % IV SOLN
Freq: Once | INTRAVENOUS | Status: AC
  Administered 2015-11-10: 23:00:00 via INTRAVENOUS

## 2015-11-10 MED ORDER — FLUCONAZOLE IN SODIUM CHLORIDE 200-0.9 MG/100ML-% IV SOLN
200.0000 mg | INTRAVENOUS | Status: DC
  Administered 2015-11-11 – 2015-11-15 (×6): 200 mg via INTRAVENOUS
  Filled 2015-11-10 (×7): qty 100

## 2015-11-10 MED ORDER — MORPHINE SULFATE 4 MG/ML IJ SOLN
2.0000 mg | Freq: Once | INTRAMUSCULAR | Status: AC
Start: 2015-11-10 — End: 2015-11-10
  Administered 2015-11-10: 2 mg via INTRAVENOUS
  Filled 2015-11-10: qty 1

## 2015-11-10 MED ORDER — SODIUM CHLORIDE 0.9 % IV BOLUS (SEPSIS): 1000.0000 mL | Freq: Once | INTRAVENOUS | Status: DC

## 2015-11-10 MED ORDER — MOMETASONE FUROATE 0.1 % EX CREA: 1.0000 "application " | TOPICAL_CREAM | Freq: Every day | CUTANEOUS | Status: DC | PRN

## 2015-11-10 MED ORDER — HYDROMORPHONE HCL 4 MG/ML IJ SOLN
INTRAMUSCULAR | Status: AC
  Filled 2015-11-10: qty 1

## 2015-11-10 MED ORDER — SODIUM CHLORIDE 0.9 % IJ SOLN
3.0000 mL | Freq: Two times a day (BID) | INTRAMUSCULAR | Status: DC
  Administered 2015-11-10 – 2015-11-15 (×7): 3 mL via INTRAVENOUS

## 2015-11-10 MED ORDER — ACETAMINOPHEN 650 MG RE SUPP
650.0000 mg | RECTAL | Status: DC | PRN
Start: 2015-11-10 — End: 2015-11-17

## 2015-11-10 MED ORDER — DIPHENHYDRAMINE HCL 50 MG/ML IJ SOLN: 25.0000 mg | Freq: Four times a day (QID) | INTRAMUSCULAR | Status: DC | PRN

## 2015-11-10 MED ORDER — ACETAMINOPHEN 650 MG RE SUPP
650.0000 mg | Freq: Four times a day (QID) | RECTAL | Status: DC | PRN
  Administered 2015-11-10: 650 mg via RECTAL
  Filled 2015-11-10: qty 1

## 2015-11-10 MED ORDER — MORPHINE SULFATE (PF) 4 MG/ML IV SOLN
INTRAVENOUS | Status: AC
  Filled 2015-11-10: qty 1

## 2015-11-10 NOTE — Assessment & Plan Note (Signed)
Patient reports acute onset of severe thrush/mucositis and apparent esophagitis within this last week following chemotherapy.  He states that his throat hurts too much to either drink or eat food.  He feels very dehydrated today. Patient received approximately 1250 ML's normal saline IV fluid rehydration; prior to be admitted to the hospital for further evaluation and management today.

## 2015-11-10 NOTE — Progress Notes (Addendum)
ANTIBIOTIC CONSULT NOTE - INITIAL  Pharmacy Consult for vancomycin/cefepime Indication: febrile neutropenia  Allergies  Allergen Reactions  . Cortisone Other (See Comments)    MI  . Latex Rash  . Lisinopril Rash and Other (See Comments)    Swelling of face  . Other Rash    Dr Malachi Bonds  . Antihistamines, Chlorpheniramine-Type Itching  . Flexeril [Cyclobenzaprine] Itching    Patient Measurements: Height: '5\' 5"'$  (165.1 cm) Weight: 184 lb 1.4 oz (83.5 kg) IBW/kg (Calculated) : 61.5 Adjusted Body Weight:   Vital Signs: Temp: 98.7 F (37.1 C) (12/29 2053) Temp Source: Oral (12/29 2053) BP: 130/68 mmHg (12/29 2053) Pulse Rate: 114 (12/29 2053) Intake/Output from previous day:   Intake/Output from this shift: Total I/O In: -  Out: 275 [Urine:275]  Labs:  Recent Labs  11/08/15 1234 11/08/15 1234 11/10/15 1425 11/10/15 1426  WBC 0.6*  --  0.3*  --   HGB 8.9*  --  7.4*  --   PLT 38*  --  5*  --   CREATININE  --  1.0  --  1.3   Estimated Creatinine Clearance: 53.3 mL/min (by C-G formula based on Cr of 1.3). No results for input(s): VANCOTROUGH, VANCOPEAK, VANCORANDOM, GENTTROUGH, GENTPEAK, GENTRANDOM, TOBRATROUGH, TOBRAPEAK, TOBRARND, AMIKACINPEAK, AMIKACINTROU, AMIKACIN in the last 72 hours.   Microbiology: No results found for this or any previous visit (from the past 720 hour(s)).  Medical History: Past Medical History  Diagnosis Date  . Coronary artery disease     2D ECHO, 10/31/2010 - EF >55%, normal; NUCLEAR STRESS TEST, 10/23/2010 - perfusion defect in inferior myocardial region, post-stress EF 62%, EKG negative for ischemia  . Hypertension   . Hyperlipemia   . Chronic back pain   . DVT (deep venous thrombosis) ([place])     history 2004 after knee surg  . BPH (benign prostatic hyperplasia)   . Arthritis   . Neuromuscular disorder ([place])   . Carpal tunnel syndrome   . 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 GERD (gastroesophageal reflux disease)   .  Myocardial infarction ([place]) 2005    from steroids  . S/P angioplasty with stent, BMS to LCX  12/23/12 12/23/2012  . Back pain 12/23/2012  . Shortness of breath     with exertion  . COPD (chronic obstructive pulmonary disease) ([place])   . Carpal tunnel syndrome, right   . Skin rash 08/22/2015   Assessment: 80 YOM with lung cancer admitted from [facility] with severe mouth and throat pain d/t mucositis.   Pharmacy asked to dose vancomycin and cefepime for febrile neutropenia. He was given pegfilgrastim on 12/23. Addition of empiric vancomycin appropriate in setting of severe mucositis.   12/29 >> vancomycin  >> 12/29 >> cefepime  >>    12/29 blood: 12/29 blood - fungus  Renal: slight rise in SCr likely related to dehydration WBC = 0.3 (ANC = 100) Tm = 103.2  Dose changes/levels:  Goal of Therapy:  Vancomycin trough level 15-20 mcg/ml  Plan:   Vancomycin 1.5gm IV x 1 then '750mg'$  IV q12h  Follow renal function and check trough as indicated  Cefepime 2gm IV q8h  Narrow as appropriate for fever, ANC, cultures, etc.  Doreene Eland, PharmD, BCPS.   Pager: [phone] 11/10/2015 9:05 PM

## 2015-11-10 NOTE — Progress Notes (Signed)
Acceptance note  Received a call from Ms. Selena Lesser, NP with Dr. Curt Bears regarding this patient for direct admission from the cancer center. 69 year old male with history of lung cancer, last chemotherapy on 12/19, last dose of Neulasta on 12/23, seen in office on 12/27 for severe pancytopenia related to chemotherapy and started on prophylactic Cipro, seen at oncology office today for complaints of severe mouth and throat pain secondary to mucositis, thrush, associated dehydration with acute kidney injury and severe pancytopenia. Patient said to be awake, alert and oriented, hemodynamically stable. Mild gingival bleeding. Accepted for direct admission to medical bed, inpatient status. She recommends transfusing 2 units of PRBCs, 1 unit of platelets, no Neulasta or Neupogen for now (WBC is expected to improve over the next day or 2 from previous dose of Neulasta), broad-spectrum IV antibiotics, antifungals for thrush and neutropenic precautions.  Vernell Leep, MD, FACP, FHM. [facility] Pager [phone]  If 7PM-7AM, please contact night-coverage [email or web address] Password [facility] 11/10/2015, 5:34 PM

## 2015-11-10 NOTE — Progress Notes (Signed)
Patient arrived to unit at around 1758, came via wheelchair from the Cancer center.  Patient has difficulty speaking due to severe thrush and mucositis. Received pain med in the cancer center. Patient resting at this time. Admission flow manager notified. Will continue to monitor.

## 2015-11-10 NOTE — Assessment & Plan Note (Addendum)
Patient received cycle 4 of his carboplatin/etoposide chemotherapy regimen on 10/31/15.  He received Neulasta for growth factor support on 11/04/2015.  Patient is scheduled to return on 11/15/2015 for labs, this, and his next cycle of chemotherapy.

## 2015-11-10 NOTE — Assessment & Plan Note (Signed)
Patient reports severe mucositis and thrush; with apparent esophagitis following his last cycle of chemotherapy last week.  Exam reveals thick white coating to tongue and oral mucosa./Posterior oropharynx.  Patient is able to manage his own secretions without difficulty; but finds it hard to speak and drinks/eat.  Patient appears dehydrated today; received IV fluid rehydration while cancer Center.  Patient will be admitted to the hospital for further evaluation and management today.

## 2015-11-10 NOTE — H&P (Signed)
[facility] History and Physical  DEBORAH DONDERO URK:[id number] DOB: [birth year] DOA: 11/10/2015  Referring physician: Lorna Few, M.D.   PCP: [name], MD   Chief Complaint: Pancytopenia  HPI: Andrew Shaw is a 69 y.o. male with a past medical history of  stage IV small cell lung carcinoma (carboplatin and etoposide, status post 4 cycles, most recent cycle was last week), COPD, CAD, hypertension, hyperlipidemia, chronic back pain, BPH, DVT, GERD  who was referred from the [facility] today due to chemotherapy-induced pancytopenia after the patient presented to the [facility] to receive IV fluids and reported acute onset of mucositis with severe oral thrush, gingival bleeding and odynophagia for presumptive esophagitis since his last chemotherapy treatment. He was seen there 2 days ago by Dr. Earlie Server who was started him on prophylactic ciprofloxacin by mouth and order him a Neulasta injection.    When seen, the patient was febrile, but otherwise in no acute distress. He was unable to provide much history due to fatigue and sore throat when speaking.  Review of Systems:  Unable to fully review due to acuity of condition and discomfort to the patient when talking.   Past Medical History  Diagnosis Date  . Coronary artery disease     2D ECHO, 10/31/2010 - EF >55%, normal; NUCLEAR STRESS TEST, 10/23/2010 - perfusion defect in inferior myocardial region, post-stress EF 62%, EKG negative for ischemia  . Hypertension   . Hyperlipemia   . Chronic back pain   . DVT (deep venous thrombosis) ([place])     history 2004 after knee surg  . BPH (benign prostatic hyperplasia)   . Arthritis   . Neuromuscular disorder ([place])   . Carpal tunnel syndrome   . 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 GERD (gastroesophageal reflux disease)   . Myocardial infarction ([place]) 2005    from steroids  . S/P angioplasty with stent, BMS to LCX  12/23/12 12/23/2012  . Back pain  12/23/2012  . Shortness of breath     with exertion  . COPD (chronic obstructive pulmonary disease) ([place])   . Carpal tunnel syndrome, right   . Skin rash 08/22/2015   Past Surgical History  Procedure Laterality Date  . Knee arthroscopy  04,06    left  . Joint replacement Left 2008    lt total knee  . Manipulation knee joint Left 2009    closed lt knee   . Hernia repair  2008    umb   . Cervical fusion  1999  . Back surgery  2004    lumb fusion  . Epidural block injection      multiple lumbar  . Carpal tunnel release  04/15/2012    Procedure: CARPAL TUNNEL RELEASE;  Surgeon: Wynonia Sours, MD;  Location: [facility];  Service: Orthopedics;  Laterality: Left;  . Trigger finger release  04/15/2012    Procedure: RELEASE TRIGGER FINGER/A-1 PULLEY;  Surgeon: Wynonia Sours, MD;  Location: [facility];  Service: Orthopedics;  Laterality: Left;  left thumb and little finger  . Cardiac catheterization  12/23/2012    Mid nondominant AV groove circumflex stented with a 2.5x74m Mini Vision stent resulting in a reduction of 90% stenosis to 0% residual  . Cardiac catheterization  02/07/2004    Noncritical CAD, continue medical therapy  . Cardiac catheterization  02/03/1999    Recommended medical therapy  . Left heart catheterization with coronary angiogram N/A 12/23/2012  Procedure: LEFT HEART CATHETERIZATION WITH CORONARY ANGIOGRAM;  Surgeon: Lorretta Harp, MD;  Location: [facility] CATH LAB;  Service: Cardiovascular;  Laterality: N/A;  . Percutaneous coronary stent intervention (pci-s)  12/23/2012    Procedure: PERCUTANEOUS CORONARY STENT INTERVENTION (PCI-S);  Surgeon: Lorretta Harp, MD;  Location: [facility] CATH LAB;  Service: Cardiovascular;;  . Left heart catheterization with coronary angiogram N/A 01/11/2015    Procedure: LEFT HEART CATHETERIZATION WITH CORONARY ANGIOGRAM;  Surgeon: Peter M Martinique, MD;  Location: [facility] CATH LAB;  Service: Cardiovascular;  Laterality: N/A;  .  Colonoscopy w/ polypectomy    . Video bronchoscopy with endobronchial ultrasound N/A 07/28/2015    Procedure: VIDEO BRONCHOSCOPY WITH ENDOBRONCHIAL ULTRASOUND;  Surgeon: [name], MD;  Location: [facility];  Service: Thoracic;  Laterality: N/A;   Social History:  reports that he has been smoking Cigarettes and E-cigarettes.  He has a 12.5 pack-year smoking history. He has never used smokeless tobacco. He reports that he does not drink alcohol or use illicit drugs.  Allergies  Allergen Reactions  . Cortisone Other (See Comments)    MI  . Latex Rash  . Lisinopril Rash and Other (See Comments)    Swelling of face  . Other Rash    Dr Malachi Bonds  . Antihistamines, Chlorpheniramine-Type Itching  . Flexeril [Cyclobenzaprine] Itching    Family History  Problem Relation Age of Onset  . Heart disease Mother   . Hypertension Sister   . Diabetes Sister   . Cancer Brother     Prior to Admission medications   Medication Sig Start Date End Date Taking? Authorizing Provider  aspirin EC 81 MG tablet Take 81 mg by mouth daily.    Historical Provider, MD  ciprofloxacin (CIPRO) 500 MG tablet Take 1 tablet (500 mg total) by mouth 2 (two) times daily. 11/08/15   Curt Bears, MD  fenofibrate (TRICOR) 48 MG tablet Take 48 mg by mouth daily.    Historical Provider, MD  fluticasone (FLONASE) 50 MCG/ACT nasal spray Place 2 sprays into both nostrils daily. Patient taking differently: Place 2 sprays into both nostrils daily as needed for allergies.  07/11/15   Brand Males, MD  HYDROcodone-acetaminophen (NORCO) 10-325 MG per tablet Take 1 tablet by mouth every 4 (four) hours as needed (breakthrough pain).     Historical Provider, MD  irbesartan (AVAPRO) 150 MG tablet Take 1 tablet (150 mg total) by mouth daily. 04/12/15   Lorretta Harp, MD  lidocaine-prilocaine (EMLA) cream Apply one application to port a cath 1-2 hours prior to access. 10/18/15   Curt Bears, MD  metoprolol (LOPRESSOR) 50 MG  tablet TAKE 1 TABLET BY MOUTH TWICE DAILY Patient taking differently: TAKE 50 MG BY MOUTH TWICE DAILY 04/04/15   Lorretta Harp, MD  mometasone (ELOCON) 0.1 % cream Apply 1 application topically daily as needed (for rash).  06/24/15   Historical Provider, MD  neomycin-bacitracin-polymyxin (NEOSPORIN) ointment Apply 1 application topically daily.    Historical Provider, MD  nitroGLYCERIN (NITROSTAT) 0.4 MG SL tablet Place 1 tablet (0.4 mg total) under the tongue every 5 (five) minutes x 3 doses as needed for chest pain. 12/24/12   Luke K Kilroy, PA-C  OXYCONTIN 20 MG T12A 12 hr tablet Take 20 mg by mouth every 8 (eight) hours. 12/28/14   Historical Provider, MD  prochlorperazine (COMPAZINE) 10 MG tablet Take 1 tablet (10 mg total) by mouth every 6 (six) hours as needed for nausea or vomiting. 08/16/15   Curt Bears, MD  rosuvastatin (CRESTOR) 20 MG tablet Take 20 mg by mouth daily.    Historical Provider, MD  tiotropium (SPIRIVA) 18 MCG inhalation capsule Place 1 capsule (18 mcg total) into inhaler and inhale daily. 07/11/15   Brand Males, MD   Physical Exam: Filed Vitals:   11/10/15 1803 11/10/15 1925 11/10/15 2026  BP: 175/76  158/64  Pulse: 120  114  Temp: 99.4 F (37.4 C) 103.2 F (39.6 C) 101.1 F (38.4 C)  TempSrc: Oral Oral Oral  Resp: 18  20  Height: '5\' 5"'$  (1.651 m)    Weight: 83.5 kg (184 lb 1.4 oz)    SpO2: 99%  98%    Wt Readings from Last 3 Encounters:  11/10/15 83.5 kg (184 lb 1.4 oz)  11/08/15 83.416 kg (183 lb 14.4 oz)  10/31/15 85.594 kg (188 lb 11.2 oz)    General:  Appears ill. Eyes: PERRL, normal lids, irises & conjunctiva ENT: grossly normal hearing.           Positive oral mucosa dryness and erythema. Throat exhibits white exudate . Neck: no LAD, masses or thyromegaly Cardiovascular: Tachycardic,no m/r/g. No LE edema. Telemetry:  sinus tachycardia Respiratory: CTA bilaterally, no w/r/r. Normal respiratory effort. Abdomen: soft, ntnd Skin:  Positive  pallor and petechiae .            Positive pruritic erythematosus rash, particularly on the upper back.  Musculoskeletal: grossly normal tone BUE/BLE Psychiatric: grossly normal mood and affect, speech fluent and appropriate Neurologic: grossly non-focal.          Labs on Admission:  Basic Metabolic Panel:  Recent Labs Lab 11/08/15 1234 11/10/15 1426  NA 134* 129*  K 4.3 4.5  CO2 23 19*  GLUCOSE 109 132  BUN 13.2 29.7*  CREATININE 1.0 1.3  CALCIUM 9.5 9.7   Liver Function Tests:  Recent Labs Lab 11/08/15 1234 11/10/15 1426  AST 16 8  ALT 17 12  ALKPHOS 74 62  BILITOT 0.86 1.08  PROT 7.1 7.5  ALBUMIN 3.2* 3.1*   CBC:  Recent Labs Lab 11/08/15 1234 11/10/15 1425  WBC 0.6* 0.3*  NEUTROABS 0.0* 0.1*  HGB 8.9* 7.4*  HCT 26.7* 21.6*  MCV 98.9* 97.7  PLT 38* 5*      Assessment/Plan Principal Problem:   Pancytopenia due to chemotherapy ([place])  Admit to telemetry. Continue IV hydration. Check type and screen/crossmatch. Transfuse packed RBCs and platelets when available. Follow-up post transfusion CBC.    Neutropenic fever ([place]) Neutropenic precautions. Check blood cultures 2 Start cefepime, vancomycin and Diflucan. Follow post transfusion WBC in the morning  Active Problems:   Small cell lung cancer (stage IV)  continue treatment as per oncology.     COPD Xopenex and ipratropium nebulizer treatments as needed.    Dyslipidemia  fenofibrate and Crestor were held due to oral Trausch and esophagitis.     HTN (hypertension) His blood pressure is uncontrolled, but the patient has not been able to take his metoprolol 50 mg by mouth twice a day and the irbesartan 150 mg by mouth daily since yesterday morning. I will give metoprolol 2.5 mg IVP every 6 hours to avoid beta blocker discontinuation symptoms.     History of CAD   S/P angioplasty with stent, BMS to LCX  12/23/12 Stable at this time. Denies chest pain. Will transfer to telemetry floor. Hold  aspirin due to thrombocytopenia  low-dose IV metoprolol to avoid discontinuation symptoms, until the patient can tolerate swallowing pills.    Dehydration continue  IV fluids and monitor input and output    Esophagitis  hold oral medications.  Magic mouthwash for symptoms  started on Diflucan and oral nystatin.  clear liquid diet when able to tolerate.    Pruritic erythematous rash  continue topical corticosteroid. Benadryl 25 mg IVP every 6 hours when necessary for itching.        Code Status: Full code. DVT Prophylaxis: SCDs. Family Communication:  Disposition Plan: Admit for packed RBCs and platelet transfusion, IV antibiotic therapy.  Time spent: 70 minutes were spent in the process of this admission.  Reubin Milan [facility] Pager [phone]

## 2015-11-10 NOTE — Progress Notes (Signed)
Pt taken to floor, Room 1338, via wheelchair to be admitted. Wife at bedside with patient

## 2015-11-10 NOTE — Progress Notes (Signed)
1510: Selena Lesser, NP at chairside to assess patient.   1615: Selena Lesser, NP notified of patient's pain 10/10, no improvement after receiving 2 mg morphine IVP.  Verbal order received for 2 mg dilaudid IVP.    1620: Dr. Julien Nordmann at chairside to see patient. Patient will be admitted to inpatient.

## 2015-11-10 NOTE — Telephone Encounter (Signed)
Per MD Returned call to pt instructed pt to see Cyndee, NP at [facility] today at 3pm with labs prior and possible. Notified pt and wife. pof sent

## 2015-11-10 NOTE — Patient Instructions (Signed)

## 2015-11-10 NOTE — Telephone Encounter (Signed)
Spoke with [employer], Therapist, sports in infusion. OK to schedule pt for  IV fluids today @ 2:45pm with labs prior. Selena Lesser, NP aware to see pt in infusion room.  IVF's ordered.

## 2015-11-10 NOTE — Progress Notes (Signed)
SYMPTOM MANAGEMENT CLINIC   HPI: Andrew Shaw 69 y.o. male diagnosed with lung cancer.  Currently undergoing carboplatin/etoposide chemotherapy regimen.  Patient presented to the [facility] today to receive IV fluid rehydration.  Patient reports acute onset mucositis and severe thrush with apparent esophagitis since his last cycle of chemotherapy.  He complains of a sore throat and inability to drink or eat.  He denies any recent fevers or chills.   HPI  ROS  Past Medical History  Diagnosis Date  . Coronary artery disease     2D ECHO, 10/31/2010 - EF >55%, normal; NUCLEAR STRESS TEST, 10/23/2010 - perfusion defect in inferior myocardial region, post-stress EF 62%, EKG negative for ischemia  . Hypertension   . Hyperlipemia   . Chronic back pain   . DVT (deep venous thrombosis) ([place])     history 2004 after knee surg  . BPH (benign prostatic hyperplasia)   . Arthritis   . Neuromuscular disorder ([place])   . Carpal tunnel syndrome   . 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 GERD (gastroesophageal reflux disease)   . Myocardial infarction ([place]) 2005    from steroids  . S/P angioplasty with stent, BMS to LCX  12/23/12 12/23/2012  . Back pain 12/23/2012  . Shortness of breath     with exertion  . COPD (chronic obstructive pulmonary disease) ([place])   . Carpal tunnel syndrome, right   . Skin rash 08/22/2015    Past Surgical History  Procedure Laterality Date  . Knee arthroscopy  04,06    left  . Joint replacement Left 2008    lt total knee  . Manipulation knee joint Left 2009    closed lt knee   . Hernia repair  2008    umb   . Cervical fusion  1999  . Back surgery  2004    lumb fusion  . Epidural block injection      multiple lumbar  . Carpal tunnel release  04/15/2012    Procedure: CARPAL TUNNEL RELEASE;  Surgeon: Wynonia Sours, MD;  Location: [place] [facility];  Service: Orthopedics;  Laterality: Left;  . Trigger finger release  04/15/2012   Procedure: RELEASE TRIGGER FINGER/A-1 PULLEY;  Surgeon: Wynonia Sours, MD;  Location: [place] [facility];  Service: Orthopedics;  Laterality: Left;  left thumb and little finger  . Cardiac catheterization  12/23/2012    Mid nondominant AV groove circumflex stented with a 2.5x53m Mini Vision stent resulting in a reduction of 90% stenosis to 0% residual  . Cardiac catheterization  02/07/2004    Noncritical CAD, continue medical therapy  . Cardiac catheterization  02/03/1999    Recommended medical therapy  . Left heart catheterization with coronary angiogram N/A 12/23/2012    Procedure: LEFT HEART CATHETERIZATION WITH CORONARY ANGIOGRAM;  Surgeon: JLorretta Harp MD;  [facility];  Service: Cardiovascular;  Laterality: N/A;  . Percutaneous coronary stent intervention (pci-s)  12/23/2012    Procedure: PERCUTANEOUS CORONARY STENT INTERVENTION (PCI-S);  Surgeon: JLorretta Harp MD;  [facility];  Service: Cardiovascular;;  . Left heart catheterization with coronary angiogram N/A 01/11/2015    Procedure: LEFT HEART CATHETERIZATION WITH CORONARY ANGIOGRAM;  Surgeon: Peter M JMartinique MD;  [facility];  Service: Cardiovascular;  Laterality: N/A;  . Colonoscopy w/ polypectomy    . Video bronchoscopy with endobronchial ultrasound N/A 07/28/2015    Procedure: VIDEO BRONCHOSCOPY WITH ENDOBRONCHIAL ULTRASOUND;  Surgeon: SRemo Lipps  Chaya Jan, MD;  Location: [facility];  Service: Thoracic;  Laterality: N/A;    has CAD (coronary artery disease), cath 2005 with non obstructive disease, now 12/2102 with LCX stenosis culprit vessel; Unstable angina ([place]); Dyslipidemia; HTN (hypertension); Tobacco abuse; S/P angioplasty with stent, BMS to LCX  12/23/12; Back pain, followed at [facility] pain clinic; Carpal tunnel syndrome of right wrist, may need surgery in near future; Pain in the chest; Smoking history; Mediastinal adenopathy; Abnormal CT scan, chest; Other emphysema (HCC); ILD (interstitial  lung disease) ([place]); Sinus drainage; Small cell carcinoma of lung ([place]); Encounter for antineoplastic chemotherapy; Antineoplastic chemotherapy induced pancytopenia ([place]); Dehydration; and Esophagitis on his problem list.    is allergic to cortisone; latex; lisinopril; other; antihistamines, chlorpheniramine-type; and flexeril.    Medication List       This list is accurate as of: 11/10/15  5:58 PM.  Always use your most recent med list.               aspirin EC 81 MG tablet  Take 81 mg by mouth daily.     ciprofloxacin 500 MG tablet  Commonly known as:  CIPRO  Take 1 tablet (500 mg total) by mouth 2 (two) times daily.     fenofibrate 48 MG tablet  Commonly known as:  TRICOR  Take 48 mg by mouth daily.     fluticasone 50 MCG/ACT nasal spray  Commonly known as:  FLONASE  Place 2 sprays into both nostrils daily.     HYDROcodone-acetaminophen 10-325 MG tablet  Commonly known as:  NORCO  Take 1 tablet by mouth every 4 (four) hours as needed (breakthrough pain).     irbesartan 150 MG tablet  Commonly known as:  AVAPRO  Take 1 tablet (150 mg total) by mouth daily.     lidocaine-prilocaine cream  Commonly known as:  EMLA  Apply one application to port a cath 1-2 hours prior to access.     metoprolol 50 MG tablet  Commonly known as:  LOPRESSOR  TAKE 1 TABLET BY MOUTH TWICE DAILY     mometasone 0.1 % cream  Commonly known as:  ELOCON  Apply 1 application topically daily as needed (for rash).     neomycin-bacitracin-polymyxin ointment  Commonly known as:  NEOSPORIN  Apply 1 application topically daily.     nitroGLYCERIN 0.4 MG SL tablet  Commonly known as:  NITROSTAT  Place 1 tablet (0.4 mg total) under the tongue every 5 (five) minutes x 3 doses as needed for chest pain.     OXYCONTIN 20 mg 12 hr tablet  Generic drug:  oxyCODONE  Take 20 mg by mouth every 8 (eight) hours.     prochlorperazine 10 MG tablet  Commonly known as:  COMPAZINE  Take 1 tablet (10 mg  total) by mouth every 6 (six) hours as needed for nausea or vomiting.     rosuvastatin 20 MG tablet  Commonly known as:  CRESTOR  Take 20 mg by mouth daily.     tiotropium 18 MCG inhalation capsule  Commonly known as:  SPIRIVA  Place 1 capsule (18 mcg total) into inhaler and inhale daily.         PHYSICAL EXAMINATION  Oncology Vitals 11/10/2015 11/10/2015  Height 165 cm -  Weight 83.5 kg -  Weight (lbs) 184 lbs 1 oz -  BMI (kg/m2) 30.63 kg/m2 -  Temp 99.4 -  Pulse 120 113  Resp 18 20  SpO2 99 100  BSA (m2) 1.96 m2 -  BP Readings from Last 2 Encounters:  11/10/15 175/76  11/10/15 165/74    Physical Exam  Constitutional: He is oriented to person, place, and time. He appears dehydrated. He appears unhealthy. He has a sickly appearance.  HENT:  Head: Normocephalic and atraumatic.  Thick white coating to patient's tongue.  The posterior oropharynx.  Eyes: Conjunctivae and EOM are normal. Pupils are equal, round, and reactive to light. Right eye exhibits no discharge. Left eye exhibits no discharge. No scleral icterus.  Neck: Normal range of motion.  Pulmonary/Chest: Effort normal. No respiratory distress.  Musculoskeletal: Normal range of motion.  Neurological: He is alert and oriented to person, place, and time.  Skin: Skin is warm and dry. There is pallor.  Psychiatric: Affect normal.  Nursing note and vitals reviewed.   LABORATORY DATA:. Appointment on 11/10/2015  Component Date Value Ref Range Status  . WBC 11/10/2015 0.3* 4.0 - 10.3 10e3/uL Final  . NEUT# 11/10/2015 0.1* 1.5 - 6.5 10e3/uL Final  . HGB 11/10/2015 7.4* 13.0 - 17.1 g/dL Final  . HCT 11/10/2015 21.6* 38.4 - 49.9 % Final  . Platelets 11/10/2015 5* 140 - 400 10e3/uL Final  . MCV 11/10/2015 97.7  79.3 - 98.0 fL Final  . MCH 11/10/2015 33.5* 27.2 - 33.4 pg Final  . MCHC 11/10/2015 34.3  32.0 - 36.0 g/dL Final  . RBC 11/10/2015 2.21* 4.20 - 5.82 10e6/uL Final  . RDW 11/10/2015 19.4* 11.0 - 14.6 %  Final  . lymph# 11/10/2015 0.2* 0.9 - 3.3 10e3/uL Final  . MONO# 11/10/2015 0.0* 0.1 - 0.9 10e3/uL Final  . Eosinophils Absolute 11/10/2015 0.0  0.0 - 0.5 10e3/uL Final  . Basophils Absolute 11/10/2015 0.0  0.0 - 0.1 10e3/uL Final  . NEUT% 11/10/2015 16.6* 39.0 - 75.0 % Final  . LYMPH% 11/10/2015 76.7* 14.0 - 49.0 % Final  . MONO% 11/10/2015 6.7  0.0 - 14.0 % Final  . EOS% 11/10/2015 0.0  0.0 - 7.0 % Final  . BASO% 11/10/2015 0.0  0.0 - 2.0 % Final  . nRBC 11/10/2015 0  0 - 0 % Final  . Sodium 11/10/2015 129* 136 - 145 mEq/L Final  . Potassium 11/10/2015 4.5  3.5 - 5.1 mEq/L Final  . Chloride 11/10/2015 101  98 - 109 mEq/L Final  . CO2 11/10/2015 19* 22 - 29 mEq/L Final  . Glucose 11/10/2015 132  70 - 140 mg/dl Final   Glucose reference range is for nonfasting patients. Fasting glucose reference range is 70- 100.  Marland Kitchen BUN 11/10/2015 29.7* 7.0 - 26.0 mg/dL Final  . Creatinine 11/10/2015 1.3  0.7 - 1.3 mg/dL Final  . Total Bilirubin 11/10/2015 1.08  0.20 - 1.20 mg/dL Final  . Alkaline Phosphatase 11/10/2015 62  40 - 150 U/L Final  . AST 11/10/2015 8  5 - 34 U/L Final  . ALT 11/10/2015 12  0 - 55 U/L Final  . Total Protein 11/10/2015 7.5  6.4 - 8.3 g/dL Final  . Albumin 11/10/2015 3.1* 3.5 - 5.0 g/dL Final  . Calcium 11/10/2015 9.7  8.4 - 10.4 mg/dL Final  . Anion Gap 11/10/2015 10  3 - 11 mEq/L Final  . EGFR 11/10/2015 55* >90 ml/min/1.73 m2 Final   eGFR is calculated using the CKD-EPI Creatinine Equation (2009)  Appointment on 11/08/2015  Component Date Value Ref Range Status  . WBC 11/08/2015 0.6* 4.0 - 10.3 10e3/uL Final  . NEUT# 11/08/2015 0.0* 1.5 - 6.5 10e3/uL Final  . HGB 11/08/2015 8.9* 13.0 - 17.1  g/dL Final  . HCT 11/08/2015 26.7* 38.4 - 49.9 % Final  . Platelets 11/08/2015 38* 140 - 400 10e3/uL Final  . MCV 11/08/2015 98.9* 79.3 - 98.0 fL Final  . MCH 11/08/2015 33.0  27.2 - 33.4 pg Final  . MCHC 11/08/2015 33.3  32.0 - 36.0 g/dL Final  . RBC 11/08/2015 2.70* 4.20 - 5.82  10e6/uL Final  . RDW 11/08/2015 20.0* 11.0 - 14.6 % Final  . lymph# 11/08/2015 0.5* 0.9 - 3.3 10e3/uL Final  . MONO# 11/08/2015 0.0* 0.1 - 0.9 10e3/uL Final  . Eosinophils Absolute 11/08/2015 0.0  0.0 - 0.5 10e3/uL Final  . Basophils Absolute 11/08/2015 0.0  0.0 - 0.1 10e3/uL Final  . NEUT% 11/08/2015 0.0* 39.0 - 75.0 % Final  . LYMPH% 11/08/2015 96.4* 14.0 - 49.0 % Final  . MONO% 11/08/2015 0.0  0.0 - 14.0 % Final  . EOS% 11/08/2015 1.8  0.0 - 7.0 % Final  . BASO% 11/08/2015 1.8  0.0 - 2.0 % Final  . nRBC 11/08/2015 0  0 - 0 % Final  . Sodium 11/08/2015 134* 136 - 145 mEq/L Final  . Potassium 11/08/2015 4.3  3.5 - 5.1 mEq/L Final  . Chloride 11/08/2015 102  98 - 109 mEq/L Final  . CO2 11/08/2015 23  22 - 29 mEq/L Final  . Glucose 11/08/2015 109  70 - 140 mg/dl Final   Glucose reference range is for nonfasting patients. Fasting glucose reference range is 70- 100.  Marland Kitchen BUN 11/08/2015 13.2  7.0 - 26.0 mg/dL Final  . Creatinine 11/08/2015 1.0  0.7 - 1.3 mg/dL Final  . Total Bilirubin 11/08/2015 0.86  0.20 - 1.20 mg/dL Final  . Alkaline Phosphatase 11/08/2015 74  40 - 150 U/L Final  . AST 11/08/2015 16  5 - 34 U/L Final  . ALT 11/08/2015 17  0 - 55 U/L Final  . Total Protein 11/08/2015 7.1  6.4 - 8.3 g/dL Final  . Albumin 11/08/2015 3.2* 3.5 - 5.0 g/dL Final  . Calcium 11/08/2015 9.5  8.4 - 10.4 mg/dL Final  . Anion Gap 11/08/2015 8  3 - 11 mEq/L Final  . EGFR 11/08/2015 81* >90 ml/min/1.73 m2 Final   eGFR is calculated using the CKD-EPI Creatinine Equation (2009)     RADIOGRAPHIC STUDIES: No results found.  ASSESSMENT/PLAN:    Small cell carcinoma of lung ([place]) Patient received cycle 4 of his carboplatin/etoposide chemotherapy regimen on 10/31/15.  He received Neulasta for growth factor support on 11/04/2015.  Patient is scheduled to return on 11/15/2015 for labs, this, and his next cycle of chemotherapy.  Antineoplastic chemotherapy induced pancytopenia ([place]) Patient received  cycle 4 of his carboplatin/etoposide chemotherapy regimen on 10/31/15.  He received Neulasta for growth factor support on 11/04/2015.  Blood counts obtained today reveal WBC 0.3, ANC 0.1, HGB 7.4, and PLT 5.    Pt also has severe thrush / esophagitis; and is c/o sore throat and difficulty swallowing either drink or food.   Due to patient's significant esophagitis and subsequent dehydration and chemotherapy-induced pancytopenia-patient will be admitted to the hospital for further evaluation and management.  Brief history and report were given to the hospitalist, Dr.Honglagi prior to the patient being transported to the hospital via wheelchair.  Per Dr. Worthy Flank recommendations-advised hospitalist would recommend patient received 2 units packed red blood cells and 1 unit of platelets for transfusional support; with close monitoring of blood counts.  Patient's white count should improve within the next few days; since patient received his  Neulasta on 11/04/2015.  At present,-.  Patient is afebrile.   Dehydration Patient reports acute onset of severe thrush/mucositis and apparent esophagitis within this last week following chemotherapy.  He states that his throat hurts too much to either drink or eat food.  He feels very dehydrated today. Patient received approximately 1250 ML's normal saline IV fluid rehydration; prior to be admitted to the hospital for further evaluation and management today.  Esophagitis Patient reports severe mucositis and thrush; with apparent esophagitis following his last cycle of chemotherapy last week.  Exam reveals thick white coating to tongue and oral mucosa./Posterior oropharynx.  Patient is able to manage his own secretions without difficulty; but finds it hard to speak and drinks/eat.  Patient appears dehydrated today; received IV fluid rehydration while cancer Center.  Patient will be admitted to the hospital for further evaluation and management today.  Patient  stated understanding of all instructions; and was in agreement with this plan of care. The patient knows to call the clinic with any problems, questions or concerns.   Review/collaboration with Julien Nordmann regarding all aspects of patient's visit today.   Total time spent with patient was 40  minutes;  with greater than 75 percent of that time spent in face to face counseling regarding patient's symptoms,  and coordination of care and follow up.  Disclaimer:This dictation was prepared with Dragon/digital dictation along with [employer]. Any transcriptional errors that result from this process are unintentional.  Drue Second, NP 11/10/2015

## 2015-11-10 NOTE — Assessment & Plan Note (Addendum)
Patient received cycle 4 of his carboplatin/etoposide chemotherapy regimen on 10/31/15.  He received Neulasta for growth factor support on 11/04/2015.  Blood counts obtained today reveal WBC 0.3, ANC 0.1, HGB 7.4, and PLT 5.    Pt also has severe thrush / esophagitis; and is c/o sore throat and difficulty swallowing either drink or food.   Due to patient's significant esophagitis and subsequent dehydration and chemotherapy-induced pancytopenia-patient will be admitted to the hospital for further evaluation and management.  Brief history and report were given to the hospitalist, Dr.Honglagi prior to the patient being transported to the hospital via wheelchair.  Per Dr. Worthy Flank recommendations-advised hospitalist would recommend patient received 2 units packed red blood cells and 1 unit of platelets for transfusional support; with close monitoring of blood counts.  Patient's white count should improve within the next few days; since patient received his Neulasta on 11/04/2015.  At present,-.  Patient is afebrile.

## 2015-11-10 NOTE — Telephone Encounter (Signed)
VM message received from pt's wife stating that pt was seen here on 11/08/15, ANC was 0, c/o sore throat.  TC back and spoke with wife. She states he  was sent  home on Cipro 500 mg BID. Wife states that he is having some bleeding from his mouth, has what looks like oral thrush, and now cannot swallow ortake his medicines etc. Has been only able to get a sip of milk down every now and then for last 2 days.  Spoke with Stanton Kidney, RN with Dr. Julien Nordmann. Stanton Kidney will speak with MD and then call pt's wife back.

## 2015-11-11 ENCOUNTER — Ambulatory Visit: Payer: Medicare Other

## 2015-11-11 DIAGNOSIS — K209 Esophagitis, unspecified: Secondary | ICD-10-CM

## 2015-11-11 DIAGNOSIS — J449 Chronic obstructive pulmonary disease, unspecified: Secondary | ICD-10-CM | POA: Diagnosis present

## 2015-11-11 DIAGNOSIS — D709 Neutropenia, unspecified: Secondary | ICD-10-CM

## 2015-11-11 DIAGNOSIS — E860 Dehydration: Secondary | ICD-10-CM

## 2015-11-11 DIAGNOSIS — D6959 Other secondary thrombocytopenia: Secondary | ICD-10-CM

## 2015-11-11 DIAGNOSIS — R5081 Fever presenting with conditions classified elsewhere: Secondary | ICD-10-CM

## 2015-11-11 DIAGNOSIS — D61810 Antineoplastic chemotherapy induced pancytopenia: Principal | ICD-10-CM

## 2015-11-11 DIAGNOSIS — K1370 Unspecified lesions of oral mucosa: Secondary | ICD-10-CM

## 2015-11-11 DIAGNOSIS — J029 Acute pharyngitis, unspecified: Secondary | ICD-10-CM

## 2015-11-11 DIAGNOSIS — E46 Unspecified protein-calorie malnutrition: Secondary | ICD-10-CM

## 2015-11-11 DIAGNOSIS — D6481 Anemia due to antineoplastic chemotherapy: Secondary | ICD-10-CM

## 2015-11-11 LAB — CBC WITH DIFFERENTIAL/PLATELET
Basophils Absolute: 0 10*3/uL (ref 0.0–0.1)
Basophils Relative: 0 %
EOS ABS: 0 10*3/uL (ref 0.0–0.7)
EOS PCT: 0 %
HCT: 19.4 % — ABNORMAL LOW (ref 39.0–52.0)
Hemoglobin: 6.6 g/dL — CL (ref 13.0–17.0)
LYMPHS ABS: 0.1 10*3/uL — AB (ref 0.7–4.0)
Lymphocytes Relative: 59 %
MCH: 34 pg (ref 26.0–34.0)
MCHC: 34 g/dL (ref 30.0–36.0)
MCV: 100 fL (ref 78.0–100.0)
MONO ABS: 0.1 10*3/uL (ref 0.1–1.0)
Monocytes Relative: 35 %
NEUTROS PCT: 6 %
Neutro Abs: 0 10*3/uL — ABNORMAL LOW (ref 1.7–7.7)
RBC: 1.94 MIL/uL — AB (ref 4.22–5.81)
RDW: 19.1 % — AB (ref 11.5–15.5)
WBC: 0.2 10*3/uL — AB (ref 4.0–10.5)

## 2015-11-11 LAB — COMPREHENSIVE METABOLIC PANEL
ALT: 16 U/L — AB (ref 17–63)
AST: 17 U/L (ref 15–41)
Albumin: 2.9 g/dL — ABNORMAL LOW (ref 3.5–5.0)
Alkaline Phosphatase: 50 U/L (ref 38–126)
Anion gap: 7 (ref 5–15)
BILIRUBIN TOTAL: 0.9 mg/dL (ref 0.3–1.2)
BUN: 23 mg/dL — AB (ref 6–20)
CO2: 20 mmol/L — ABNORMAL LOW (ref 22–32)
CREATININE: 0.97 mg/dL (ref 0.61–1.24)
Calcium: 8.6 mg/dL — ABNORMAL LOW (ref 8.9–10.3)
Chloride: 107 mmol/L (ref 101–111)
GFR calc Af Amer: >60 mL/min (ref >60)
GFR calc non Af Amer: >60 mL/min (ref >60)
GLUCOSE: 125 mg/dL — AB (ref 65–99)
POTASSIUM: 3.8 mmol/L (ref 3.5–5.1)
SODIUM: 134 mmol/L — AB (ref 135–145)
TOTAL PROTEIN: 6.6 g/dL (ref 6.5–8.1)

## 2015-11-11 LAB — CBC
HEMATOCRIT: 24.3 % — AB (ref 39.0–52.0)
Hemoglobin: 8.5 g/dL — ABNORMAL LOW (ref 13.0–17.0)
MCH: 32.8 pg (ref 26.0–34.0)
MCHC: 35 g/dL (ref 30.0–36.0)
MCV: 93.8 fL (ref 78.0–100.0)
Platelets: 31 10*3/uL — ABNORMAL LOW (ref 150–400)
RBC: 2.59 MIL/uL — AB (ref 4.22–5.81)
RDW: 19.2 % — ABNORMAL HIGH (ref 11.5–15.5)
WBC: 0.6 10*3/uL — AB (ref 4.0–10.5)

## 2015-11-11 LAB — MAGNESIUM: MAGNESIUM: 1.7 mg/dL (ref 1.7–2.4)

## 2015-11-11 LAB — MRSA PCR SCREENING: MRSA by PCR: NEGATIVE

## 2015-11-11 LAB — PREPARE RBC (CROSSMATCH)

## 2015-11-11 LAB — PHOSPHORUS: PHOSPHORUS: 2 mg/dL — AB (ref 2.5–4.6)

## 2015-11-11 MED ORDER — METOPROLOL TARTRATE 1 MG/ML IV SOLN
2.5000 mg | Freq: Four times a day (QID) | INTRAVENOUS | Status: DC
  Administered 2015-11-11 – 2015-11-15 (×14): 2.5 mg via INTRAVENOUS
  Filled 2015-11-11 (×15): qty 5

## 2015-11-11 MED ORDER — HYDROMORPHONE HCL 2 MG/ML IJ SOLN
2.0000 mg | INTRAMUSCULAR | Status: DC | PRN
  Administered 2015-11-11 – 2015-11-16 (×31): 2 mg via INTRAVENOUS
  Filled 2015-11-11 (×32): qty 1

## 2015-11-11 MED ORDER — VANCOMYCIN HCL IN DEXTROSE 1-5 GM/200ML-% IV SOLN
1000.0000 mg | Freq: Two times a day (BID) | INTRAVENOUS | Status: DC
  Administered 2015-11-11 – 2015-11-13 (×5): 1000 mg via INTRAVENOUS
  Filled 2015-11-11 (×6): qty 200

## 2015-11-11 MED ORDER — OXYCODONE HCL ER 20 MG PO T12A
20.0000 mg | EXTENDED_RELEASE_TABLET | Freq: Three times a day (TID) | ORAL | Status: DC
Start: 2015-11-11 — End: 2015-11-17
  Administered 2015-11-11 – 2015-11-16 (×11): 20 mg via ORAL
  Filled 2015-11-11 (×14): qty 1

## 2015-11-11 MED ORDER — TBO-FILGRASTIM 300 MCG/0.5ML ~~LOC~~ SOSY
300.0000 ug | PREFILLED_SYRINGE | Freq: Once | SUBCUTANEOUS | Status: AC
  Administered 2015-11-11: 300 ug via SUBCUTANEOUS
  Filled 2015-11-11 (×2): qty 0.5

## 2015-11-11 MED ORDER — SODIUM CHLORIDE 0.9 % IV SOLN: Freq: Once | INTRAVENOUS | Status: DC

## 2015-11-11 MED ORDER — OXYCODONE HCL 5 MG PO TABS
5.0000 mg | ORAL_TABLET | Freq: Once | ORAL | Status: AC
  Administered 2015-11-11: 5 mg via ORAL
  Filled 2015-11-11: qty 1

## 2015-11-11 MED ORDER — SODIUM CHLORIDE 0.9 % IJ SOLN
10.0000 mL | INTRAMUSCULAR | Status: DC | PRN
  Administered 2015-11-12 – 2015-11-13 (×2): 10 mL
  Filled 2015-11-11 (×2): qty 40

## 2015-11-11 NOTE — Progress Notes (Signed)
Utilization review completed.  

## 2015-11-11 NOTE — Progress Notes (Signed)
Pharmacy Antibiotic Follow-up Note  Andrew Shaw is a 69 y.o. year-old male admitted on 11/10/2015.  The patient is currently on day 1 of Vancomycin, Cefepime for febrile neutropenia.  Renal function has improved today.  Assessment/Plan:  The dose of Vancomycin  will be adjusted to 1g IV q12h based on renal function.   Continue Cefepime 2g IV q8h  Measure Vanc trough at steady state.  Follow up renal fxn, culture results, and clinical course.    Temp (24hrs), Avg:100 F (37.8 C), Min:98.6 F (37 C), Max:103.2 F (39.6 C)   Recent Labs Lab 11/08/15 1234 11/10/15 1425 11/11/15 0405  WBC 0.6* 0.3* 0.2*    Recent Labs Lab 11/08/15 1234 11/10/15 1426 11/11/15 0405  CREATININE 1.0 1.3 0.97   Estimated Creatinine Clearance: 71.5 mL/min (by C-G formula based on Cr of 0.97).    Allergies  Allergen Reactions  . Cortisone Other (See Comments)    MI  . Latex Rash  . Lisinopril Rash and Other (See Comments)    Swelling of face  . Other Rash    Dr Malachi Bonds  . Antihistamines, Chlorpheniramine-Type Itching  . Flexeril [Cyclobenzaprine] Itching    Antimicrobials this admission: 12/29 >> vancomycin >> 12/29 >> cefepime >>  12/29 >> fluconazole >>  Levels/dose changes this admission: None  Microbiology results: 12/29 blood: pending 12/29 blood - fungus: pending  Thank you for allowing pharmacy to be a part of this patient's care. Gretta Arab PharmD, BCPS Pager [phone] 11/11/2015 8:03 AM

## 2015-11-11 NOTE — Progress Notes (Signed)
DIAGNOSIS: Extensive stage ([place], N2, M1b) small cell lung cancer in September 2016 presenting with right hilar and mediastinal lymphadenopathy as well as highly suspicious metastatic disease in the axilla bilaterally.   PRIOR THERAPY: None  CURRENT THERAPY: Systemic chemotherapy with carboplatin for AUC of 5 on day 1 and etoposide 120 MG/M2 on days one 2, 3 with Neulasta support on day 4. Status post 4 cycles.  Subjective: The patient is seen and examined today. He was admitted from the [facility] yesterday because of significant pancytopenia with platelets count less than 5000. He also has severe anemia with hemoglobin down to 6.6 g/dL. The patient also has severe leukocytopenia and neutropenia with absolute neutrophil count of 0. He is feeling a little bit better today but continues to have sore throat with difficulty swallowing. He has low-grade fever earlier today.  Objective: Vital signs in last 24 hours: Temp:  [98.6 F (37 C)-103.2 F (39.6 C)] 98.6 F (37 C) (12/30 0731) Pulse Rate:  [91-120] 91 (12/30 1100) Resp:  [13-20] 16 (12/30 1100) BP: (130-175)/(55-76) 155/63 mmHg (12/30 1100) SpO2:  [96 %-100 %] 100 % (12/30 1100) Weight:  [184 lb 1.4 oz (83.5 kg)] 184 lb 1.4 oz (83.5 kg) (12/29 1803)  Intake/Output from previous day: 12/29 0701 - 12/30 0700 In: 30 [Blood:30] Out: 600 [Urine:600] Intake/Output this shift: Total I/O In: 605 [Blood:305; IV Piggyback:300] Out: 325 [Urine:325]  General appearance: alert, cooperative, fatigued and no distress Resp: clear to auscultation bilaterally Cardio: regular rate and rhythm, S1, S2 normal, no murmur, click, rub or gallop GI: soft, non-tender; bowel sounds normal; no masses,  no organomegaly Extremities: extremities normal, atraumatic, no cyanosis or edema  Lab Results:   Recent Labs  11/10/15 1425 11/11/15 0405  WBC 0.3* 0.2*  HGB 7.4* 6.6*  HCT 21.6* 19.4*  PLT 5* <5*   BMET  Recent Labs  11/10/15 1426  11/11/15 0405  NA 129* 134*  K 4.5 3.8  CL  --  107  CO2 19* 20*  GLUCOSE 132 125*  BUN 29.7* 23*  CREATININE 1.3 0.97  CALCIUM 9.7 8.6*    Studies/Results: No results found.  Medications: I have reviewed the patient's current medications.   Assessment/Plan: 1) extensive stage small cell lung cancer: Currently undergoing systemic chemotherapy with carboplatin and etoposide with Neulasta support status post 4 cycles.  2) chemotherapy-induced neutropenic fever: His absolute neutrophil count is 0. I would consider the patient for additional treatment with Granix 300 mcg subcutaneously until his absolute neutrophil count is over 1000. Continue current antibiotics with cefepime and vancomycin. 3) chemotherapy-induced anemia: Consider the patient for 2 units of PRBCs transfusion. 4) chemotherapy-induced thrombocytopenia: The patient will receive 1 unit platelet transfusion today and we'll continue to monitor his platelets count closely. 5) mouth and throat soreness: Continue treatment with fentanyl patch and Dilaudid as well as Diflucan for the oral thrush. 6) malnutrition and dehydration: Continue IV hydration. Thank you for taking good care of Mr. Merlo, I will continue to follow up the patient with you and assist in his management on as-needed basis.  LOS: 1 day    Kyrielle Urbanski K. 11/11/2015

## 2015-11-11 NOTE — Progress Notes (Signed)
PROGRESS NOTE  Andrew Shaw HFW:[id number] DOB: August 25, 1946 DOA: 11/10/2015 PCP: [name], MD  HPI/Recap of past 24 hours:  plt remain less than 5, fever subsided, reported chronic back pain  Assessment/Plan: Principal Problem:   Pancytopenia due to chemotherapy [facility]) Active Problems:   Dyslipidemia   HTN (hypertension)   S/P angioplasty with stent, BMS to LCX  12/23/12   Dehydration   Esophagitis   Neutropenic fever (HCC)   COPD (chronic obstructive pulmonary disease) ([place])  Chemo related pancytopenia: after two prbc and one platelet, hgb is 6, plt remains less than 5, no active bleed, will transfuse another unit of plt and prbc. On bed rest. Repeat cbc after transfusion, transfuse prn to keep hgb >7, plt >10  Neutropenic fever: blood culture pending, on vanc/cefepime/diflucan. Last dose of neulast on 12/23, talked to oncology Dr. Julien Nordmann who recommended neupogen 300SC  Mucositis/esophagitis: topical nystatin solution, diflucan, diet as tolerated, ivf. Pain control  Small cell lung cancer: defer to oncology   H/o CAD s/p BMS yo lcx in 2014, on betablocker  Htn: continue betablocker  Pruritic erythematous rash  continue topical corticosteroid. Benadryl 25 mg IVP every 6 hours when necessary for itching  Code Status: full  Family Communication: patient   Disposition Plan: transfer to stepdown   Consultants:  oncology  Procedures:  none  Antibiotics:  Vanc/cefepime/diflucan   Objective: BP 137/68 mmHg  Pulse 103  Temp(Src) 98.6 F (37 C) (Oral)  Resp 18  Ht '5\' 5"'$  (1.651 m)  Wt 184 lb 1.4 oz (83.5 kg)  BMI 30.63 kg/m2  SpO2 100%  Intake/Output Summary (Last 24 hours) at 11/11/15 0277 Last data filed at 11/11/15 0805  Gross per 24 hour  Intake    335 ml  Output    925 ml  Net   -590 ml   Filed Weights   11/10/15 1803  Weight: 184 lb 1.4 oz (83.5 kg)    Exam:   General:  Drowsy, does not look comfortable  Cardiovascular:  RRR  Respiratory: CTABL  Abdomen: Soft/ND/NT, positive BS  Musculoskeletal: No Edema  Neuro: aaox3  Skin: rash upper chest and back (chronic)  Data Reviewed: Basic Metabolic Panel:  Recent Labs Lab 11/08/15 1234 11/10/15 1426 11/11/15 0405  NA 134* 129* 134*  K 4.3 4.5 3.8  CL  --   --  107  CO2 23 19* 20*  GLUCOSE 109 132 125*  BUN 13.2 29.7* 23*  CREATININE 1.0 1.3 0.97  CALCIUM 9.5 9.7 8.6*  MG  --   --  1.7  PHOS  --   --  2.0*   Liver Function Tests:  Recent Labs Lab 11/08/15 1234 11/10/15 1426 11/11/15 0405  AST '16 8 17  '$ ALT 17 12 16*  ALKPHOS 74 62 50  BILITOT 0.86 1.08 0.9  PROT 7.1 7.5 6.6  ALBUMIN 3.2* 3.1* 2.9*   No results for input(s): LIPASE, AMYLASE in the last 168 hours. No results for input(s): AMMONIA in the last 168 hours. CBC:  Recent Labs Lab 11/08/15 1234 11/10/15 1425 11/11/15 0405  WBC 0.6* 0.3* 0.2*  NEUTROABS 0.0* 0.1* 0.0*  HGB 8.9* 7.4* 6.6*  HCT 26.7* 21.6* 19.4*  MCV 98.9* 97.7 100.0  PLT 38* 5* <5*   Cardiac Enzymes:   No results for input(s): CKTOTAL, CKMB, CKMBINDEX, TROPONINI in the last 168 hours. BNP (last 3 results)  Recent Labs  01/08/15 1617  BNP 58.2    ProBNP (last 3 results) No results for input(s):  PROBNP in the last 8760 hours.  CBG: No results for input(s): GLUCAP in the last 168 hours.  No results found for this or any previous visit (from the past 240 hour(s)).   Studies: No results found.  Scheduled Meds: . sodium chloride   Intravenous Once  . sodium chloride   Intravenous Once  . ceFEPime (MAXIPIME) IV  2 g Intravenous 3 times per day  . fentaNYL  25 mcg Transdermal Q72H  . fluconazole (DIFLUCAN) IV  200 mg Intravenous Q24H  . metoprolol  2.5 mg Intravenous 4 times per day  . neomycin-bacitracin-polymyxin  1 application Topical Daily  . nystatin  5 mL Oral QID  . sodium chloride  3 mL Intravenous Q12H  . Tbo-Filgrastim  300 mcg Subcutaneous Once  . tiotropium  18 mcg  Inhalation Daily  . vancomycin  1,000 mg Intravenous Q12H    Continuous Infusions:    Time spent: 79mns  Salih Williamson MD, PhD  [facility] Pager [phone] If 7PM-7AM, please contact night-coverage at [email or web address], password [email or web address]12/30/2016, 8:22 AM  LOS: 1 day

## 2015-11-12 DIAGNOSIS — I10 Essential (primary) hypertension: Secondary | ICD-10-CM

## 2015-11-12 DIAGNOSIS — C3490 Malignant neoplasm of unspecified part of unspecified bronchus or lung: Secondary | ICD-10-CM

## 2015-11-12 LAB — CBC WITH DIFFERENTIAL/PLATELET
BASOS PCT: 4 %
Basophils Absolute: 0 10*3/uL (ref 0.0–0.1)
EOS ABS: 0 10*3/uL (ref 0.0–0.7)
EOS PCT: 2 %
HCT: 23.9 % — ABNORMAL LOW (ref 39.0–52.0)
Hemoglobin: 8.3 g/dL — ABNORMAL LOW (ref 13.0–17.0)
Lymphocytes Relative: 78 %
Lymphs Abs: 0.5 10*3/uL — ABNORMAL LOW (ref 0.7–4.0)
MCH: 32.2 pg (ref 26.0–34.0)
MCHC: 34.7 g/dL (ref 30.0–36.0)
MCV: 92.6 fL (ref 78.0–100.0)
MONO ABS: 0 10*3/uL — AB (ref 0.1–1.0)
Monocytes Relative: 6 %
NEUTROS ABS: 0.1 10*3/uL — AB (ref 1.7–7.7)
Neutrophils Relative %: 10 %
PLATELETS: 28 10*3/uL — AB (ref 150–400)
RBC: 2.58 MIL/uL — ABNORMAL LOW (ref 4.22–5.81)
RDW: 19.6 % — AB (ref 11.5–15.5)
WBC: 0.6 10*3/uL — CL (ref 4.0–10.5)

## 2015-11-12 LAB — COMPREHENSIVE METABOLIC PANEL
ALT: 49 U/L (ref 17–63)
ANION GAP: 5 (ref 5–15)
AST: 44 U/L — ABNORMAL HIGH (ref 15–41)
Albumin: 2.7 g/dL — ABNORMAL LOW (ref 3.5–5.0)
Alkaline Phosphatase: 48 U/L (ref 38–126)
BUN: 27 mg/dL — ABNORMAL HIGH (ref 6–20)
CALCIUM: 8.7 mg/dL — AB (ref 8.9–10.3)
CHLORIDE: 108 mmol/L (ref 101–111)
CO2: 23 mmol/L (ref 22–32)
Creatinine, Ser: 0.84 mg/dL (ref 0.61–1.24)
GFR calc non Af Amer: >60 mL/min (ref >60)
Glucose, Bld: 102 mg/dL — ABNORMAL HIGH (ref 65–99)
Potassium: 3.9 mmol/L (ref 3.5–5.1)
SODIUM: 136 mmol/L (ref 135–145)
Total Bilirubin: 1.7 mg/dL — ABNORMAL HIGH (ref 0.3–1.2)
Total Protein: 6.4 g/dL — ABNORMAL LOW (ref 6.5–8.1)

## 2015-11-12 LAB — PROTIME-INR
INR: 1.41 (ref 0.00–1.49)
Prothrombin Time: 17.3 seconds — ABNORMAL HIGH (ref 11.6–15.2)

## 2015-11-12 LAB — TSH: TSH: 2.002 u[IU]/mL (ref 0.350–4.500)

## 2015-11-12 MED ORDER — TBO-FILGRASTIM 300 MCG/0.5ML ~~LOC~~ SOSY
300.0000 ug | PREFILLED_SYRINGE | Freq: Every day | SUBCUTANEOUS | Status: DC
  Administered 2015-11-12 – 2015-11-14 (×3): 300 ug via SUBCUTANEOUS
  Filled 2015-11-12 (×4): qty 0.5

## 2015-11-12 NOTE — Progress Notes (Signed)
PROGRESS NOTE  Andrew Shaw IRC:[id number] DOB: 1946-06-04 DOA: 11/10/2015 PCP: [name], MD  HPI/Recap of past 24 hours:  fever subsided,HGB and plt better, c/o sore throat, not able to eat, wife in room  Assessment/Plan: Principal Problem:   Pancytopenia due to chemotherapy [facility]) Active Problems:   Dyslipidemia   HTN (hypertension)   S/P angioplasty with stent, BMS to LCX  12/23/12   Dehydration   Esophagitis   Neutropenic fever (HCC)   COPD (chronic obstructive pulmonary disease) ([place])  Chemo related pancytopenia:  plt less than 5 , hgb 6.6 on admission S/p 3prbc and 1-2 platelet, improving, no active bleed,   transfuse prn to keep hgb >7, plt >10 On bed rest, fall precaution.  Neutropenic fever:  blood culture pending, on vanc/cefepime/diflucan since admission.  Last dose of neulast on 12/23, talked to oncology Dr. Julien Nordmann who recommended neupogen 300SC qd  Mucositis/esophagitis: topical nystatin solution, diflucan, diet as tolerated, ivf. Pain control  Small cell lung cancer: defer to oncology   CAD s/p BMS yo lcx in 2014, on betablocker  HTN: continue betablocker  Pruritic erythematous rash  continue topical corticosteroid. Benadryl 25 mg IVP every 6 hours when necessary for itching  Cancer pain: continue home regimen, add prn for breakthrough pain, stool softener.   Code Status: full  Family Communication: patient and wife  Disposition Plan: remain in stepdown   Consultants:  oncology  Procedures:  none  Antibiotics:  Vanc/cefepime/diflucan   Objective: BP 132/51 mmHg  Pulse 81  Temp(Src) 97.6 F (36.4 C) (Oral)  Resp 16  Ht '5\' 5"'$  (1.651 m)  Wt 176 lb 12.9 oz (80.2 kg)  BMI 29.42 kg/m2  SpO2 97%  Intake/Output Summary (Last 24 hours) at 11/12/15 1744 Last data filed at 11/12/15 1200  Gross per 24 hour  Intake   1545 ml  Output   1225 ml  Net    320 ml   Filed Weights   11/10/15 1803 11/12/15 0500  Weight: 184 lb 1.4  oz (83.5 kg) 176 lb 12.9 oz (80.2 kg)    Exam:   General:  Alert, frail, does not look comfortable due to sore throat  Cardiovascular: RRR  Respiratory: CTABL  Abdomen: Soft/ND/NT, positive BS  Musculoskeletal: No Edema  Neuro: aaox3  Skin: rash upper chest and back (chronic)  Data Reviewed: Basic Metabolic Panel:  Recent Labs Lab 11/08/15 1234 11/10/15 1426 11/11/15 0405 11/12/15 0400  NA 134* 129* 134* 136  K 4.3 4.5 3.8 3.9  CL  --   --  107 108  CO2 23 19* 20* 23  GLUCOSE 109 132 125* 102*  BUN 13.2 29.7* 23* 27*  CREATININE 1.0 1.3 0.97 0.84  CALCIUM 9.5 9.7 8.6* 8.7*  MG  --   --  1.7  --   PHOS  --   --  2.0*  --    Liver Function Tests:  Recent Labs Lab 11/08/15 1234 11/10/15 1426 11/11/15 0405 11/12/15 0400  AST '16 8 17 '$ 44*  ALT 17 12 16* 49  ALKPHOS 74 62 50 48  BILITOT 0.86 1.08 0.9 1.7*  PROT 7.1 7.5 6.6 6.4*  ALBUMIN 3.2* 3.1* 2.9* 2.7*   No results for input(s): LIPASE, AMYLASE in the last 168 hours. No results for input(s): AMMONIA in the last 168 hours. CBC:  Recent Labs Lab 11/08/15 1234 11/10/15 1425 11/11/15 0405 11/11/15 2200 11/12/15 0400  WBC 0.6* 0.3* 0.2* 0.6* 0.6*  NEUTROABS 0.0* 0.1* 0.0*  --  0.1*  HGB 8.9* 7.4* 6.6* 8.5* 8.3*  HCT 26.7* 21.6* 19.4* 24.3* 23.9*  MCV 98.9* 97.7 100.0 93.8 92.6  PLT 38* 5* <5* 31* 28*   Cardiac Enzymes:   No results for input(s): CKTOTAL, CKMB, CKMBINDEX, TROPONINI in the last 168 hours. BNP (last 3 results)  Recent Labs  01/08/15 1617  BNP 58.2    ProBNP (last 3 results) No results for input(s): PROBNP in the last 8760 hours.  CBG: No results for input(s): GLUCAP in the last 168 hours.  Recent Results (from the past 240 hour(s))  Culture, blood (Routine X 2) w Reflex to ID Panel     Status: None (Preliminary result)   Collection Time: 11/10/15  9:38 PM  Result Value Ref Range Status   Specimen Description BLOOD LEFT HAND  Final   Special Requests BOTTLES DRAWN  AEROBIC AND ANAEROBIC 10CC  Final   Culture   Final    NO GROWTH 1 DAY Performed at [facility]    Report Status PENDING  Incomplete  Culture, blood (Routine X 2) w Reflex to ID Panel     Status: None (Preliminary result)   Collection Time: 11/10/15  9:45 PM  Result Value Ref Range Status   Specimen Description BLOOD LEFT ARM  Final   Special Requests BOTTLES DRAWN AEROBIC AND ANAEROBIC 6CC  Final   Culture   Final    NO GROWTH 1 DAY Performed at [facility]    Report Status PENDING  Incomplete  MRSA PCR Screening     Status: None   Collection Time: 11/11/15 10:19 AM  Result Value Ref Range Status   MRSA by PCR NEGATIVE NEGATIVE Final    Comment:        The GeneXpert MRSA Assay (FDA approved for NASAL specimens only), is one component of a comprehensive MRSA colonization surveillance program. It is not intended to diagnose MRSA infection nor to guide or monitor treatment for MRSA infections.      Studies: No results found.  Scheduled Meds: . sodium chloride   Intravenous Once  . sodium chloride   Intravenous Once  . ceFEPime (MAXIPIME) IV  2 g Intravenous 3 times per day  . fentaNYL  25 mcg Transdermal Q72H  . fluconazole (DIFLUCAN) IV  200 mg Intravenous Q24H  . metoprolol  2.5 mg Intravenous 4 times per day  . neomycin-bacitracin-polymyxin  1 application Topical Daily  . nystatin  5 mL Oral QID  . oxyCODONE  20 mg Oral 3 times per day  . sodium chloride  3 mL Intravenous Q12H  . Tbo-Filgrastim  300 mcg Subcutaneous q1800  . tiotropium  18 mcg Inhalation Daily  . vancomycin  1,000 mg Intravenous Q12H    Continuous Infusions:    Time spent: 71mns  Travante Knee MD, PhD  [facility] Pager [phone] If 7PM-7AM, please contact night-coverage at [email or web address], password T[facility]12/31/2016, 5:44 PM  LOS: 2 days

## 2015-11-12 NOTE — Progress Notes (Signed)
CRITICAL VALUE ALERT  Critical value received:  Platelets  Date of notification:  11/12/15  Time of notification:  0600  Critical value read back:Yes.    Nurse who received alert:  Rosetta Posner  MD notified (1st page):  L. Harduk  Time of first page:  0600  MD notified (2nd page): L. Harduk  Time of second page: 0620  Responding MD:  Roger Shelter  Time MD responded:  [id number]

## 2015-11-13 ENCOUNTER — Inpatient Hospital Stay (HOSPITAL_COMMUNITY): Payer: Medicare Other

## 2015-11-13 DIAGNOSIS — K1230 Oral mucositis (ulcerative), unspecified: Secondary | ICD-10-CM

## 2015-11-13 DIAGNOSIS — R7989 Other specified abnormal findings of blood chemistry: Secondary | ICD-10-CM

## 2015-11-13 LAB — CBC WITH DIFFERENTIAL/PLATELET
BASOS ABS: 0 10*3/uL (ref 0.0–0.1)
BASOS PCT: 3 %
EOS ABS: 0 10*3/uL (ref 0.0–0.7)
Eosinophils Relative: 1 %
HCT: 24 % — ABNORMAL LOW (ref 39.0–52.0)
Hemoglobin: 8.3 g/dL — ABNORMAL LOW (ref 13.0–17.0)
LYMPHS PCT: 74 %
Lymphs Abs: 0.6 10*3/uL — ABNORMAL LOW (ref 0.7–4.0)
MCH: 32.3 pg (ref 26.0–34.0)
MCHC: 34.6 g/dL (ref 30.0–36.0)
MCV: 93.4 fL (ref 78.0–100.0)
MONOS PCT: 6 %
Monocytes Absolute: 0 10*3/uL — ABNORMAL LOW (ref 0.1–1.0)
NEUTROS ABS: 0.1 10*3/uL — AB (ref 1.7–7.7)
Neutrophils Relative %: 16 %
PLATELETS: 17 10*3/uL — AB (ref 150–400)
RBC: 2.57 MIL/uL — ABNORMAL LOW (ref 4.22–5.81)
RDW: 18.7 % — ABNORMAL HIGH (ref 11.5–15.5)
WBC: 0.7 10*3/uL — CL (ref 4.0–10.5)

## 2015-11-13 LAB — TYPE AND SCREEN
ABO/RH(D): A POS
Antibody Screen: NEGATIVE
UNIT DIVISION: 0
Unit division: 0
Unit division: 0

## 2015-11-13 LAB — COMPREHENSIVE METABOLIC PANEL
ALBUMIN: 2.5 g/dL — AB (ref 3.5–5.0)
ALT: 170 U/L — AB (ref 17–63)
AST: 172 U/L — AB (ref 15–41)
Alkaline Phosphatase: 92 U/L (ref 38–126)
Anion gap: 6 (ref 5–15)
BUN: 21 mg/dL — AB (ref 6–20)
CHLORIDE: 106 mmol/L (ref 101–111)
CO2: 23 mmol/L (ref 22–32)
CREATININE: 0.75 mg/dL (ref 0.61–1.24)
Calcium: 8.6 mg/dL — ABNORMAL LOW (ref 8.9–10.3)
GFR calc Af Amer: >60 mL/min (ref >60)
GFR calc non Af Amer: >60 mL/min (ref >60)
Glucose, Bld: 94 mg/dL (ref 65–99)
Potassium: 3.6 mmol/L (ref 3.5–5.1)
SODIUM: 135 mmol/L (ref 135–145)
Total Bilirubin: 1.3 mg/dL — ABNORMAL HIGH (ref 0.3–1.2)
Total Protein: 6.2 g/dL — ABNORMAL LOW (ref 6.5–8.1)

## 2015-11-13 LAB — CBC
HEMATOCRIT: 24 % — AB (ref 39.0–52.0)
Hemoglobin: 8.3 g/dL — ABNORMAL LOW (ref 13.0–17.0)
MCH: 32.2 pg (ref 26.0–34.0)
MCHC: 34.6 g/dL (ref 30.0–36.0)
MCV: 93 fL (ref 78.0–100.0)
Platelets: 13 10*3/uL — CL (ref 150–400)
RBC: 2.58 MIL/uL — ABNORMAL LOW (ref 4.22–5.81)
RDW: 18.5 % — AB (ref 11.5–15.5)
WBC: 0.9 10*3/uL — CL (ref 4.0–10.5)

## 2015-11-13 LAB — PREPARE PLATELET PHERESIS: Unit division: 0

## 2015-11-13 LAB — VANCOMYCIN, TROUGH: Vancomycin Tr: 12 ug/mL (ref 10.0–20.0)

## 2015-11-13 MED ORDER — VANCOMYCIN HCL IN DEXTROSE 1-5 GM/200ML-% IV SOLN
1000.0000 mg | Freq: Three times a day (TID) | INTRAVENOUS | Status: DC
  Administered 2015-11-13 – 2015-11-15 (×6): 1000 mg via INTRAVENOUS
  Filled 2015-11-13 (×6): qty 200

## 2015-11-13 NOTE — Progress Notes (Signed)
Pharmacy Antibiotic Follow-up Note  Andrew Shaw is a 70 y.o. year-old male admitted on 11/10/2015.  The patient is currently on day 3 of Vancomycin, Cefepime for febrile neutropenia.  Renal function has improved and vancomycin trough level is below goal.  Assessment/Plan:  Continue Cefepime 2g IV q8h Increase to Vancomycin 1g IV q8h. Recheck Vanc trough at steady state. Follow up renal fxn, culture results, and clinical course.   Temp (24hrs), Avg:98.4 F (36.9 C), Min:97.6 F (36.4 C), Max:99 F (37.2 C)   Recent Labs Lab 11/10/15 1425 11/11/15 0405 11/11/15 2200 11/12/15 0400 11/13/15 0445  WBC 0.3* 0.2* 0.6* 0.6* 0.7*     Recent Labs Lab 11/08/15 1234 11/10/15 1426 11/11/15 0405 11/12/15 0400 11/13/15 0445  CREATININE 1.0 1.3 0.97 0.84 0.75   Estimated Creatinine Clearance: 85.7 mL/min (by C-G formula based on Cr of 0.75).    Allergies  Allergen Reactions  . Cortisone Other (See Comments)    MI  . Latex Rash  . Lisinopril Rash and Other (See Comments)    Swelling of face  . Other Rash    Dr Malachi Bonds  . Antihistamines, Chlorpheniramine-Type Itching  . Flexeril [Cyclobenzaprine] Itching    Antimicrobials this admission: 12/30 >> vancomycin  >> 12/29 >> cefepime  >>   12/30 >> fluconazole >> 12/29 >> Nystatin susp >>  Levels/dose changes this admission:  1/1 0730 VT = 12 on vanc 1g q12h  Microbiology results: 12/29 blood: ngtd 12/29 blood - fungus: ngtd 12/30 MRSA PCR: negative  Thank you for allowing pharmacy to be a part of this patient's care. Gretta Arab PharmD, BCPS Pager [phone] 11/13/2015 7:17 AM

## 2015-11-13 NOTE — Progress Notes (Signed)
PROGRESS NOTE  Andrew Shaw WJX:[id number] DOB: Oct 31, 1946 DOA: 11/10/2015 PCP: [name], MD  HPI/Recap of past 24 hours:  Feeling better, less sore mouth, no fever,  wonder when he can go home,   Assessment/Plan: Principal Problem:   Pancytopenia due to chemotherapy [facility]) Active Problems:   Dyslipidemia   HTN (hypertension)   S/P angioplasty with stent, BMS to LCX  12/23/12   Dehydration   Esophagitis   Neutropenic fever (HCC)   COPD (chronic obstructive pulmonary disease) ([place])  Chemo related pancytopenia:  plt less than 5 , hgb 6.6 on admission S/p 3prbc and 1-2 platelet, improving, no active bleed,   transfuse prn to keep hgb >7, plt >10 fall precaution.  Neutropenic fever:  blood culture pending, on vanc/cefepime/diflucan since admission.  Last dose of neulast on 12/23, talked to oncology Dr. Julien Nordmann who recommended neupogen 300SC qd Improving  Mucositis/esophagitis: topical nystatin solution, diflucan, diet as tolerated, ivf. Pain control  Elevated lft: check RUQ [place], hepatitis panel   Small cell lung cancer: defer to oncology   CAD s/p BMS yo lcx in 2014, on betablocker  HTN: continue betablocker  Pruritic erythematous rash  continue topical corticosteroid. Benadryl 25 mg IVP every 6 hours when necessary for itching  Cancer pain: continue home regimen, add prn for breakthrough pain, stool softener.   Code Status: full  Family Communication: patient and wife  Disposition Plan: remain in stepdown,    Consultants:  oncology  Procedures:  none  Antibiotics:  Vanc/cefepime/diflucan   Objective: BP 135/62 mmHg  Pulse 59  Temp(Src) 98.6 F (37 C) (Oral)  Resp 15  Ht '5\' 5"'$  (1.651 m)  Wt 179 lb 7.3 oz (81.4 kg)  BMI 29.86 kg/m2  SpO2 93%  Intake/Output Summary (Last 24 hours) at 11/13/15 0749 Last data filed at 11/13/15 0600  Gross per 24 hour  Intake   1675 ml  Output   1175 ml  Net    500 ml   Filed Weights   11/10/15  1803 11/12/15 0500 11/13/15 0343  Weight: 184 lb 1.4 oz (83.5 kg) 176 lb 12.9 oz (80.2 kg) 179 lb 7.3 oz (81.4 kg)    Exam:   General:  Alert, frail, better  Cardiovascular: RRR  Respiratory: CTABL  Abdomen: Soft/ND/NT, positive BS  Musculoskeletal: No Edema  Neuro: aaox3  Skin: rash upper chest and back (chronic)  Data Reviewed: Basic Metabolic Panel:  Recent Labs Lab 11/08/15 1234 11/10/15 1426 11/11/15 0405 11/12/15 0400 11/13/15 0445  NA 134* 129* 134* 136 135  K 4.3 4.5 3.8 3.9 3.6  CL  --   --  107 108 106  CO2 23 19* 20* 23 23  GLUCOSE 109 132 125* 102* 94  BUN 13.2 29.7* 23* 27* 21*  CREATININE 1.0 1.3 0.97 0.84 0.75  CALCIUM 9.5 9.7 8.6* 8.7* 8.6*  MG  --   --  1.7  --   --   PHOS  --   --  2.0*  --   --    Liver Function Tests:  Recent Labs Lab 11/08/15 1234 11/10/15 1426 11/11/15 0405 11/12/15 0400 11/13/15 0445  AST '16 8 17 '$ 44* 172*  ALT 17 12 16* 49 170*  ALKPHOS 74 62 50 48 92  BILITOT 0.86 1.08 0.9 1.7* 1.3*  PROT 7.1 7.5 6.6 6.4* 6.2*  ALBUMIN 3.2* 3.1* 2.9* 2.7* 2.5*   No results for input(s): LIPASE, AMYLASE in the last 168 hours. No results for input(s): AMMONIA in the last  168 hours. CBC:  Recent Labs Lab 11/08/15 1234 11/10/15 1425 11/11/15 0405 11/11/15 2200 11/12/15 0400 11/13/15 0445  WBC 0.6* 0.3* 0.2* 0.6* 0.6* 0.7*  NEUTROABS 0.0* 0.1* 0.0*  --  0.1* 0.1*  HGB 8.9* 7.4* 6.6* 8.5* 8.3* 8.3*  HCT 26.7* 21.6* 19.4* 24.3* 23.9* 24.0*  MCV 98.9* 97.7 100.0 93.8 92.6 93.4  PLT 38* 5* <5* 31* 28* 17*   Cardiac Enzymes:   No results for input(s): CKTOTAL, CKMB, CKMBINDEX, TROPONINI in the last 168 hours. BNP (last 3 results)  Recent Labs  01/08/15 1617  BNP 58.2    ProBNP (last 3 results) No results for input(s): PROBNP in the last 8760 hours.  CBG: No results for input(s): GLUCAP in the last 168 hours.  Recent Results (from the past 240 hour(s))  Culture, blood (Routine X 2) w Reflex to ID Panel      Status: None (Preliminary result)   Collection Time: 11/10/15  9:38 PM  Result Value Ref Range Status   Specimen Description BLOOD LEFT HAND  Final   Special Requests BOTTLES DRAWN AEROBIC AND ANAEROBIC 10CC  Final   Culture   Final    NO GROWTH 1 DAY Performed at [facility]    Report Status PENDING  Incomplete  Culture, blood (Routine X 2) w Reflex to ID Panel     Status: None (Preliminary result)   Collection Time: 11/10/15  9:45 PM  Result Value Ref Range Status   Specimen Description BLOOD LEFT ARM  Final   Special Requests BOTTLES DRAWN AEROBIC AND ANAEROBIC 6CC  Final   Culture   Final    NO GROWTH 1 DAY Performed at [facility]    Report Status PENDING  Incomplete  MRSA PCR Screening     Status: None   Collection Time: 11/11/15 10:19 AM  Result Value Ref Range Status   MRSA by PCR NEGATIVE NEGATIVE Final    Comment:        The GeneXpert MRSA Assay (FDA approved for NASAL specimens only), is one component of a comprehensive MRSA colonization surveillance program. It is not intended to diagnose MRSA infection nor to guide or monitor treatment for MRSA infections.      Studies: No results found.  Scheduled Meds: . sodium chloride   Intravenous Once  . sodium chloride   Intravenous Once  . ceFEPime (MAXIPIME) IV  2 g Intravenous 3 times per day  . fentaNYL  25 mcg Transdermal Q72H  . fluconazole (DIFLUCAN) IV  200 mg Intravenous Q24H  . metoprolol  2.5 mg Intravenous 4 times per day  . neomycin-bacitracin-polymyxin  1 application Topical Daily  . nystatin  5 mL Oral QID  . oxyCODONE  20 mg Oral 3 times per day  . sodium chloride  3 mL Intravenous Q12H  . Tbo-Filgrastim  300 mcg Subcutaneous q1800  . tiotropium  18 mcg Inhalation Daily  . vancomycin  1,000 mg Intravenous Q12H    Continuous Infusions:    Time spent: 38mns  Kirbi Farrugia MD, PhD  [facility] Pager [phone] If 7PM-7AM, please contact night-coverage at [email or web address],  password [email or web address]1/11/2015, 7:49 AM  LOS: 3 days

## 2015-11-14 DIAGNOSIS — Z959 Presence of cardiac and vascular implant and graft, unspecified: Secondary | ICD-10-CM

## 2015-11-14 LAB — COMPREHENSIVE METABOLIC PANEL
ALBUMIN: 2.6 g/dL — AB (ref 3.5–5.0)
ALT: 102 U/L — ABNORMAL HIGH (ref 17–63)
ANION GAP: 8 (ref 5–15)
AST: 50 U/L — AB (ref 15–41)
Alkaline Phosphatase: 86 U/L (ref 38–126)
BUN: 15 mg/dL (ref 6–20)
CO2: 22 mmol/L (ref 22–32)
Calcium: 8.6 mg/dL — ABNORMAL LOW (ref 8.9–10.3)
Chloride: 104 mmol/L (ref 101–111)
Creatinine, Ser: 0.63 mg/dL (ref 0.61–1.24)
GFR calc Af Amer: >60 mL/min (ref >60)
GFR calc non Af Amer: >60 mL/min (ref >60)
GLUCOSE: 104 mg/dL — AB (ref 65–99)
POTASSIUM: 3.6 mmol/L (ref 3.5–5.1)
SODIUM: 134 mmol/L — AB (ref 135–145)
Total Bilirubin: 0.8 mg/dL (ref 0.3–1.2)
Total Protein: 6.2 g/dL — ABNORMAL LOW (ref 6.5–8.1)

## 2015-11-14 LAB — CBC
HCT: 24.1 % — ABNORMAL LOW (ref 39.0–52.0)
HEMOGLOBIN: 8.4 g/dL — AB (ref 13.0–17.0)
MCH: 32.4 pg (ref 26.0–34.0)
MCHC: 34.9 g/dL (ref 30.0–36.0)
MCV: 93.1 fL (ref 78.0–100.0)
Platelets: 10 10*3/uL — CL (ref 150–400)
RBC: 2.59 MIL/uL — ABNORMAL LOW (ref 4.22–5.81)
RDW: 18 % — AB (ref 11.5–15.5)
WBC: 0.8 10*3/uL — AB (ref 4.0–10.5)

## 2015-11-14 LAB — MAGNESIUM: Magnesium: 1.8 mg/dL (ref 1.7–2.4)

## 2015-11-14 NOTE — Evaluation (Signed)
Physical Therapy Evaluation Patient Details Name: Andrew Shaw MRN: [id number] DOB: [birth year] Today's Date: 11/14/2015   History of Present Illness  70 yo male with history of lung cancer,  admitted from  cancer center  11/12/15 with chemo induced pancytopenia and oral thrush.  Clinical Impression  Patient is mildly unbalanced without  Upper  extremity support. Should progress to Dc to home. Recommend HHOPT. Pt admitted with above diagnosis. Pt currently with functional limitations due to the deficits listed below (see PT Problem List). Pt will benefit from skilled PT to increase their independence and safety with mobility to allow discharge tohome.     Follow Up Recommendations Home health PT;Supervision/Assistance - 24 hour    Equipment Recommendations  None recommended by PT    Recommendations for Other Services       Precautions / Restrictions Precautions Precautions: Fall      Mobility  Bed Mobility Overal bed mobility: Needs Assistance Bed Mobility: Supine to Sit;Sit to Supine     Supine to sit: Supervision Sit to supine: Supervision   General bed mobility comments: extra time   Transfers Overall transfer level: Needs assistance Equipment used: 1 person hand held assist Transfers: Sit to/from Stand Sit to Stand: Min guard         General transfer comment: cues for safety, mildly impuslive, cues  to slow down  so lines can be organized.  Ambulation/Gait Ambulation/Gait assistance: Min assist Ambulation Distance (Feet): 440 Feet Assistive device: 1 person hand held assist;None Gait Pattern/deviations: Step-through pattern;Drifts right/left     General Gait Details: initially provided HHA, patient ambulated without  and demostrates decreased balam=nce, swaying, steady assist at times for balance.  Stairs            Wheelchair Mobility    Modified Rankin (Stroke Patients Only)       Balance Overall balance assessment: Needs  assistance Sitting-balance support: No upper extremity supported;Feet supported Sitting balance-Leahy Scale: Good     Standing balance support: During functional activity;No upper extremity supported Standing balance-Leahy Scale: Poor                               Pertinent Vitals/Pain Pain Assessment: No/denies pain    Home Living Family/patient expects to be discharged to:: Private residence Living Arrangements: Spouse/significant other Available Help at Discharge: Family Type of Home: House Home Access: Stairs to enter   Technical brewer of Steps: 2 Home Layout: One level Home Equipment: Environmental consultant - 2 wheels      Prior Function Level of Independence: Independent               Hand Dominance        Extremity/Trunk Assessment   Upper Extremity Assessment: Generalized weakness           Lower Extremity Assessment: Generalized weakness      Cervical / Trunk Assessment: Normal  Communication   Communication: No difficulties  Cognition Arousal/Alertness: Awake/alert Behavior During Therapy: WFL for tasks assessed/performed Overall Cognitive Status: No family/caregiver present to determine baseline cognitive functioning                      General Comments      Exercises        Assessment/Plan    PT Assessment Patient needs continued PT services  PT Diagnosis Difficulty walking;Abnormality of gait   PT Problem List Decreased strength;Decreased activity tolerance;Decreased balance;Decreased mobility;Decreased cognition;Decreased knowledge  of precautions;Decreased safety awareness;Decreased knowledge of use of DME  PT Treatment Interventions DME instruction;Gait training;Stair training;Functional mobility training;Therapeutic activities;Therapeutic exercise;Patient/family education   PT Goals (Current goals can be found in the Care Plan section) Acute Rehab PT Goals Patient Stated Goal: to walk, go home PT Goal Formulation:  With patient Time For Goal Achievement: 11/28/15 Potential to Achieve Goals: Good    Frequency Min 3X/week   Barriers to discharge        Co-evaluation               End of Session Equipment Utilized During Treatment: Gait belt Activity Tolerance: Patient tolerated treatment well Patient left: in bed;with call bell/phone within reach;with bed alarm set Nurse Communication: Mobility status         Time: 5825-1898 PT Time Calculation (min) (ACUTE ONLY): 18 min   Charges:   PT Evaluation $Initial PT Evaluation Tier I: 1 Procedure     PT G CodesClaretha Cooper 11/14/2015, 3:30 PM Tresa Endo PT [phone]

## 2015-11-14 NOTE — Progress Notes (Signed)
PROGRESS NOTE  Andrew Shaw:[id number] DOB: 10-29-1946 DOA: 11/10/2015 PCP: [name], MD  HPI/Recap of past 24 hours:  Feeling better, less sore mouth, no fever, tolerating clears,   Assessment/Plan: Principal Problem:   Pancytopenia due to chemotherapy [facility]) Active Problems:   Dyslipidemia   HTN (hypertension)   S/P angioplasty with stent, BMS to LCX  12/23/12   Dehydration   Esophagitis   Neutropenic fever (HCC)   COPD (chronic obstructive pulmonary disease) ([place])  Chemo related pancytopenia:  plt less than 5 , hgb 6.6 on admission S/p 3prbc and 1-2 platelet, improving, no active bleed,   transfuse prn to keep hgb >7, plt >10 fall precaution.  Neutropenic fever:  blood culture pending, on vanc/cefepime/diflucan since admission.  Last dose of neulast on 12/23, talked to oncology Dr. Julien Nordmann who recommended neupogen 300SC qd Improving  Mucositis/esophagitis: topical nystatin solution, diflucan, diet as tolerated,  Pain control, continue ivf for another 24hrs  Elevated lft: RUQ [place] no acute findings, hepatitis panel pending, lft improving  Small cell lung cancer: defer to oncology   CAD s/p BMS yo lcx in 2014, on betablocker  HTN: continue betablocker  Pruritic erythematous rash  continue topical corticosteroid. Benadryl 25 mg IVP every 6 hours when necessary for itching  Cancer pain: continue home regimen, add prn for breakthrough pain, stool softener.   Code Status: full  Family Communication: patient and wife  Disposition Plan: remain in stepdown until wbc >1 and plt >10 consistently   Consultants:  oncology  Procedures:  none  Antibiotics:  Vanc/cefepime/diflucan   Objective: BP 118/66 mmHg  Pulse 75  Temp(Src) 98.1 F (36.7 C) (Oral)  Resp 8  Ht '5\' 5"'$  (1.651 m)  Wt 179 lb 7.3 oz (81.4 kg)  BMI 29.86 kg/m2  SpO2 97%  Intake/Output Summary (Last 24 hours) at 11/14/15 0901 Last data filed at 11/14/15 0800  Gross per 24  hour  Intake   1340 ml  Output    825 ml  Net    515 ml   Filed Weights   11/10/15 1803 11/12/15 0500 11/13/15 0343  Weight: 184 lb 1.4 oz (83.5 kg) 176 lb 12.9 oz (80.2 kg) 179 lb 7.3 oz (81.4 kg)    Exam:   General:  Alert, frail, better  Cardiovascular: RRR  Respiratory: CTABL  Abdomen: Soft/ND/NT, positive BS  Musculoskeletal: No Edema  Neuro: aaox3  Skin: rash upper chest and back (chronic)  Data Reviewed: Basic Metabolic Panel:  Recent Labs Lab 11/10/15 1426 11/11/15 0405 11/12/15 0400 11/13/15 0445 11/14/15 0530  NA 129* 134* 136 135 134*  K 4.5 3.8 3.9 3.6 3.6  CL  --  107 108 106 104  CO2 19* 20* '23 23 22  '$ GLUCOSE 132 125* 102* 94 104*  BUN 29.7* 23* 27* 21* 15  CREATININE 1.3 0.97 0.84 0.75 0.63  CALCIUM 9.7 8.6* 8.7* 8.6* 8.6*  MG  --  1.7  --   --  1.8  PHOS  --  2.0*  --   --   --    Liver Function Tests:  Recent Labs Lab 11/10/15 1426 11/11/15 0405 11/12/15 0400 11/13/15 0445 11/14/15 0530  AST 8 17 44* 172* 50*  ALT 12 16* 49 170* 102*  ALKPHOS 62 50 48 92 86  BILITOT 1.08 0.9 1.7* 1.3* 0.8  PROT 7.5 6.6 6.4* 6.2* 6.2*  ALBUMIN 3.1* 2.9* 2.7* 2.5* 2.6*   No results for input(s): LIPASE, AMYLASE in the last 168 hours. No  results for input(s): AMMONIA in the last 168 hours. CBC:  Recent Labs Lab 11/08/15 1234 11/10/15 1425  11/11/15 0405 11/11/15 2200 11/12/15 0400 2015-11-15 0445 11/15/2015 1210 11/14/15 0530  WBC 0.6* 0.3*  < > 0.2* 0.6* 0.6* 0.7* 0.9* 0.8*  NEUTROABS 0.0* 0.1*  --  0.0*  --  0.1* 0.1*  --   --   HGB 8.9* 7.4*  < > 6.6* 8.5* 8.3* 8.3* 8.3* 8.4*  HCT 26.7* 21.6*  < > 19.4* 24.3* 23.9* 24.0* 24.0* 24.1*  MCV 98.9* 97.7  < > 100.0 93.8 92.6 93.4 93.0 93.1  PLT 38* 5*  < > <5* 31* 28* 17* 13* 10*  < > = values in this interval not displayed. Cardiac Enzymes:   No results for input(s): CKTOTAL, CKMB, CKMBINDEX, TROPONINI in the last 168 hours. BNP (last 3 results)  Recent Labs  01/08/15 1617  BNP 58.2      ProBNP (last 3 results) No results for input(s): PROBNP in the last 8760 hours.  CBG: No results for input(s): GLUCAP in the last 168 hours.  Recent Results (from the past 240 hour(s))  Culture, blood (Routine X 2) w Reflex to ID Panel     Status: None (Preliminary result)   Collection Time: 11/10/15  9:38 PM  Result Value Ref Range Status   Specimen Description BLOOD LEFT HAND  Final   Special Requests BOTTLES DRAWN AEROBIC AND ANAEROBIC 10CC  Final   Culture   Final    NO GROWTH 2 DAYS Performed at [facility]    Report Status PENDING  Incomplete  Culture, blood (Routine X 2) w Reflex to ID Panel     Status: None (Preliminary result)   Collection Time: 11/10/15  9:45 PM  Result Value Ref Range Status   Specimen Description BLOOD LEFT ARM  Final   Special Requests BOTTLES DRAWN AEROBIC AND ANAEROBIC 6CC  Final   Culture   Final    NO GROWTH 2 DAYS Performed at [facility]    Report Status PENDING  Incomplete  MRSA PCR Screening     Status: None   Collection Time: 11/11/15 10:19 AM  Result Value Ref Range Status   MRSA by PCR NEGATIVE NEGATIVE Final    Comment:        The GeneXpert MRSA Assay (FDA approved for NASAL specimens only), is one component of a comprehensive MRSA colonization surveillance program. It is not intended to diagnose MRSA infection nor to guide or monitor treatment for MRSA infections.      Studies: US Abdomen Limited Ruq  2015-11-15  CLINICAL DATA:  Elevated LFTs. Hernia repair. History of hypertension, CAD, small cell lung cancer. EXAM: US ABDOMEN LIMITED - RIGHT UPPER QUADRANT COMPARISON:  09/23/2015 CT exam FINDINGS: Gallbladder: Gallbladder is mildly contracted. Gallbladder wall is 2.3 mm. No sonographic Murphy sign or pericholecystic fluid. No gallstones identified. Common bile duct: Diameter: 3.2 mm Liver: Liver is mildly heterogeneous without definite discrete mass. IMPRESSION: 1. Gallbladder mildly contracted. No  evidence for acute cholecystitis. 2. Mildly heterogeneous liver. No discrete mass. Possible focal hepatic steatosis. Electronically Signed   By: Nolon Nations M.D.   On: Nov 15, 2015 12:17    Scheduled Meds: . ceFEPime (MAXIPIME) IV  2 g Intravenous 3 times per day  . fentaNYL  25 mcg Transdermal Q72H  . fluconazole (DIFLUCAN) IV  200 mg Intravenous Q24H  . metoprolol  2.5 mg Intravenous 4 times per day  . neomycin-bacitracin-polymyxin  1 application Topical Daily  .  nystatin  5 mL Oral QID  . oxyCODONE  20 mg Oral 3 times per day  . sodium chloride  3 mL Intravenous Q12H  . Tbo-Filgrastim  300 mcg Subcutaneous q1800  . tiotropium  18 mcg Inhalation Daily  . vancomycin  1,000 mg Intravenous Q8H    Continuous Infusions:    Time spent: 18mns  Alicen Donalson MD, PhD  [facility] Pager [phone] If 7PM-7AM, please contact night-coverage at [email or web address], password [email or web address]1/12/2015, 9:01 AM  LOS: 4 days

## 2015-11-14 NOTE — Progress Notes (Signed)
PT Cancellation Note  Patient Details Name: Andrew Shaw MRN: [id number] DOB: 1945/12/17   Cancelled Treatment:    Reason Eval/Treat Not Completed: Other (comment) (has bee OOB with nursing, now back to bed . will return this PM for OOB.)   [name] 11/14/2015, 11:55 AM Tresa Endo PT [phone]

## 2015-11-15 ENCOUNTER — Other Ambulatory Visit: Payer: Medicare Other

## 2015-11-15 ENCOUNTER — Ambulatory Visit: Payer: Medicare Other

## 2015-11-15 ENCOUNTER — Ambulatory Visit: Payer: Medicare Other | Admitting: Oncology

## 2015-11-15 DIAGNOSIS — E876 Hypokalemia: Secondary | ICD-10-CM

## 2015-11-15 LAB — COMPREHENSIVE METABOLIC PANEL
ALT: 67 U/L — ABNORMAL HIGH (ref 17–63)
AST: 28 U/L (ref 15–41)
Albumin: 2.4 g/dL — ABNORMAL LOW (ref 3.5–5.0)
Alkaline Phosphatase: 75 U/L (ref 38–126)
Anion gap: 6 (ref 5–15)
BUN: 11 mg/dL (ref 6–20)
CHLORIDE: 100 mmol/L — AB (ref 101–111)
CO2: 24 mmol/L (ref 22–32)
Calcium: 8.5 mg/dL — ABNORMAL LOW (ref 8.9–10.3)
Creatinine, Ser: 0.61 mg/dL (ref 0.61–1.24)
GFR calc Af Amer: >60 mL/min (ref >60)
Glucose, Bld: 106 mg/dL — ABNORMAL HIGH (ref 65–99)
POTASSIUM: 3.4 mmol/L — AB (ref 3.5–5.1)
Sodium: 130 mmol/L — ABNORMAL LOW (ref 135–145)
Total Bilirubin: 0.8 mg/dL (ref 0.3–1.2)
Total Protein: 5.9 g/dL — ABNORMAL LOW (ref 6.5–8.1)

## 2015-11-15 LAB — HEPATITIS PANEL, ACUTE
HEP A IGM: NEGATIVE
HEP B C IGM: NEGATIVE
HEP B S AG: NEGATIVE

## 2015-11-15 LAB — CBC
HCT: 23.5 % — ABNORMAL LOW (ref 39.0–52.0)
Hemoglobin: 8 g/dL — ABNORMAL LOW (ref 13.0–17.0)
MCH: 32.1 pg (ref 26.0–34.0)
MCHC: 34 g/dL (ref 30.0–36.0)
MCV: 94.4 fL (ref 78.0–100.0)
PLATELETS: 7 10*3/uL — AB (ref 150–400)
RBC: 2.49 MIL/uL — ABNORMAL LOW (ref 4.22–5.81)
RDW: 17.6 % — AB (ref 11.5–15.5)
WBC: 1.1 10*3/uL — AB (ref 4.0–10.5)

## 2015-11-15 LAB — MAGNESIUM: MAGNESIUM: 1.6 mg/dL — AB (ref 1.7–2.4)

## 2015-11-15 MED ORDER — METOPROLOL TARTRATE 50 MG PO TABS
50.0000 mg | ORAL_TABLET | Freq: Two times a day (BID) | ORAL | Status: DC
  Administered 2015-11-15 – 2015-11-16 (×3): 50 mg via ORAL
  Filled 2015-11-15 (×3): qty 1

## 2015-11-15 MED ORDER — POTASSIUM CHLORIDE 20 MEQ/15ML (10%) PO SOLN
40.0000 meq | Freq: Once | ORAL | Status: AC
  Administered 2015-11-15: 40 meq via ORAL
  Filled 2015-11-15: qty 30

## 2015-11-15 MED ORDER — SODIUM CHLORIDE 0.9 % IV SOLN
INTRAVENOUS | Status: DC
  Administered 2015-11-15: 1000 mL via INTRAVENOUS

## 2015-11-15 MED ORDER — MAGNESIUM SULFATE 2 GM/50ML IV SOLN
2.0000 g | Freq: Once | INTRAVENOUS | Status: AC
  Administered 2015-11-15: 2 g via INTRAVENOUS
  Filled 2015-11-15: qty 50

## 2015-11-15 MED ORDER — SODIUM CHLORIDE 0.9 % IV SOLN
Freq: Once | INTRAVENOUS | Status: AC
  Administered 2015-11-15: 12:00:00 via INTRAVENOUS

## 2015-11-15 NOTE — Progress Notes (Signed)
Date: November 15, 2015 Chart reviewed for concurrent status and case management needs. Will continue to follow patient for changes and needs:  Wbc<1 and platelets [place], RN, BSN, [place]   [phone]

## 2015-11-15 NOTE — Progress Notes (Signed)
Pharmacy Antibiotic Follow-up Note  Andrew Shaw is a 70 y.o. year-old male admitted on 11/10/2015.  The patient is currently on day 5 of Vancomycin, Cefepime for febrile neutropenia.  Renal function has improved and vancomycin trough level is below goal.  Assessment/Plan:  Text paged Dr. Erlinda Hong to consider stopping vancomycin as no Staph has been identified, awaiting call back.  Continue Cefepime 2g IV q8h Continue Vancomycin 1g IV q8h - check trough before 16:00 dose today Follow up renal fxn, culture results, and clinical course.   Temp (24hrs), Avg:98.1 F (36.7 C), Min:97.8 F (36.6 C), Max:98.8 F (37.1 C)   Recent Labs Lab 11/12/15 0400 11/13/15 0445 11/13/15 1210 11/14/15 0530 11/15/15 0610  WBC 0.6* 0.7* 0.9* 0.8* 1.1*     Recent Labs Lab 11/11/15 0405 11/12/15 0400 11/13/15 0445 11/14/15 0530 11/15/15 0610  CREATININE 0.97 0.84 0.75 0.63 0.61   Estimated Creatinine Clearance: 86.4 mL/min (by C-G formula based on Cr of 0.61).    Allergies  Allergen Reactions  . Cortisone Other (See Comments)    MI  . Latex Rash  . Lisinopril Rash and Other (See Comments)    Swelling of face  . Other Rash    Dr Malachi Bonds  . Antihistamines, Chlorpheniramine-Type Itching  . Flexeril [Cyclobenzaprine] Itching    Antimicrobials this admission: 12/30 >> vancomycin  >> 12/29 >> cefepime  >>   12/30 >> fluconazole >> 12/29 >> Nystatin susp >>  Levels/dose changes this admission:  12/30 SCr improved, Vanc dose increased to 1g q12 per nomogram. 1/1 0730 VT = 12 on vanc 1g q12h (before 6th dose), increase to 1g q8h 1/2 1530 VT = ___ on vanc 1g q8h  Microbiology results: 12/29 blood: ngtd 12/29 blood - fungus: ngtd 12/30 MRSA PCR: negative 1/2 hepatitis panel: neg  Thank you for allowing pharmacy to be a part of this patient's care.  Peggyann Juba, PharmD, BCPS Pager: [phone]  11/15/2015 11:19 AM

## 2015-11-15 NOTE — Progress Notes (Signed)
PROGRESS NOTE  Andrew Shaw SEG:[id number] DOB: 04-Jun-1946 DOA: 11/10/2015 PCP: [name], MD  HPI/Recap of past 24 hours:  Feeling better, less sore mouth, no fever, tolerating soft diet  Assessment/Plan: Principal Problem:   Pancytopenia due to chemotherapy [facility]) Active Problems:   Dyslipidemia   HTN (hypertension)   S/P angioplasty with stent, BMS to LCX  12/23/12   Dehydration   Esophagitis   Neutropenic fever (HCC)   COPD (chronic obstructive pulmonary disease) ([place])  Chemo related pancytopenia:  plt less than 5 , hgb 6.6 on admission no active bleed, fall precaution.  1/3: wbc1.1, hgb 8, plt 7, transfuse 1uint plt on 1/3 ( so far total of 2 units of plt and 2prbc transfusion since in the hospital). Transfer to tele bed,  transfuse prn to keep hgb >7, plt >10  Neutropenic fever:  blood culture pending, on vanc/cefepime/diflucan since admission.  Last dose of neulast on 12/23, talked to oncology Dr. Julien Nordmann who recommended neupogen 300SC qd 1/3: now wbc1.1, d/c neupogen .  D/c vanc, continue cefepime/difluacan, continue narrow abx ( change to oral) if wbc continue improving.  Mucositis/esophagitis: topical nystatin solution, diflucan, diet as tolerated,  Pain control, continue ivf ,  oral intake still not adequate.  Hypokalemia/hypomagnesemia: replace k and mag.   Elevated lft: RUQ [place] no acute findings, hepatitis panel negative, lft improving  Small cell lung cancer: defer to oncology   CAD s/p BMS yo lcx in 2014, was on iv betablocker due to npo, changed to oral  Betablocker once able to take po. bp stable.  HTN: continue betablocker, may restart home meds irbesartan if bp start to go up.  Pruritic erythematous rash  continue topical corticosteroid. Benadryl 25 mg IVP every 6 hours when necessary for itching  Cancer pain: continue home regimen, add prn for breakthrough pain, stool softener.   Code Status: full  Family Communication: patient    Disposition Plan: transfer from stepdown to med tele, possible d/c in 24-48hrs if wbc/plt continue to improve and able to take adequate oral intake.   Consultants:  Oncology (Dr Julien Nordmann)  Procedures:  none  Antibiotics:  Cefepime/diflucan from admission -  vanc from admission to 1/3   Objective: BP 157/69 mmHg  Pulse 86  Temp(Src) 98.1 F (36.7 C) (Oral)  Resp 20  Ht '5\' 5"'$  (1.651 m)  Wt 183 lb 3.2 oz (83.1 kg)  BMI 30.49 kg/m2  SpO2 98%  Intake/Output Summary (Last 24 hours) at 11/15/15 0840 Last data filed at 11/15/15 0400  Gross per 24 hour  Intake   1240 ml  Output   1350 ml  Net   -110 ml   Filed Weights   11/12/15 0500 11/13/15 0343 11/15/15 0413  Weight: 176 lb 12.9 oz (80.2 kg) 179 lb 7.3 oz (81.4 kg) 183 lb 3.2 oz (83.1 kg)    Exam:   General:  Alert, frail, better  Cardiovascular: RRR  Respiratory: CTABL  Abdomen: Soft/ND/NT, positive BS  Musculoskeletal: No Edema  Neuro: aaox3  Skin: rash upper chest and back (chronic)  Data Reviewed: Basic Metabolic Panel:  Recent Labs Lab 11/11/15 0405 11/12/15 0400 11/13/15 0445 11/14/15 0530 11/15/15 0610  NA 134* 136 135 134* 130*  K 3.8 3.9 3.6 3.6 3.4*  CL 107 108 106 104 100*  CO2 20* '23 23 22 24  '$ GLUCOSE 125* 102* 94 104* 106*  BUN 23* 27* 21* 15 11  CREATININE 0.97 0.84 0.75 0.63 0.61  CALCIUM 8.6* 8.7* 8.6* 8.6* 8.5*  MG 1.7  --   --  1.8 1.6*  PHOS 2.0*  --   --   --   --    Liver Function Tests:  Recent Labs Lab 11/11/15 0405 11/12/15 0400 11/13/15 0445 11/14/15 0530 11/15/15 0610  AST 17 44* 172* 50* 28  ALT 16* 49 170* 102* 67*  ALKPHOS 50 48 92 86 75  BILITOT 0.9 1.7* 1.3* 0.8 0.8  PROT 6.6 6.4* 6.2* 6.2* 5.9*  ALBUMIN 2.9* 2.7* 2.5* 2.6* 2.4*   No results for input(s): LIPASE, AMYLASE in the last 168 hours. No results for input(s): AMMONIA in the last 168 hours. CBC:  Recent Labs Lab 11/08/15 1234 11/10/15 1425 11/11/15 0405  11/12/15 0400  11/13/15 0445 11/13/15 1210 11/14/15 0530 11/15/15 0610  WBC 0.6* 0.3* 0.2*  < > 0.6* 0.7* 0.9* 0.8* 1.1*  NEUTROABS 0.0* 0.1* 0.0*  --  0.1* 0.1*  --   --   --   HGB 8.9* 7.4* 6.6*  < > 8.3* 8.3* 8.3* 8.4* 8.0*  HCT 26.7* 21.6* 19.4*  < > 23.9* 24.0* 24.0* 24.1* 23.5*  MCV 98.9* 97.7 100.0  < > 92.6 93.4 93.0 93.1 94.4  PLT 38* 5* <5*  < > 28* 17* 13* 10* 7*  < > = values in this interval not displayed. Cardiac Enzymes:   No results for input(s): CKTOTAL, CKMB, CKMBINDEX, TROPONINI in the last 168 hours. BNP (last 3 results)  Recent Labs  01/08/15 1617  BNP 58.2    ProBNP (last 3 results) No results for input(s): PROBNP in the last 8760 hours.  CBG: No results for input(s): GLUCAP in the last 168 hours.  Recent Results (from the past 240 hour(s))  Culture, blood (Routine X 2) w Reflex to ID Panel     Status: None (Preliminary result)   Collection Time: 11/10/15  9:38 PM  Result Value Ref Range Status   Specimen Description BLOOD LEFT HAND  Final   Special Requests BOTTLES DRAWN AEROBIC AND ANAEROBIC 10CC  Final   Culture   Final    NO GROWTH 3 DAYS Performed at [facility]    Report Status PENDING  Incomplete  Culture, blood (Routine X 2) w Reflex to ID Panel     Status: None (Preliminary result)   Collection Time: 11/10/15  9:45 PM  Result Value Ref Range Status   Specimen Description BLOOD LEFT ARM  Final   Special Requests BOTTLES DRAWN AEROBIC AND ANAEROBIC 6CC  Final   Culture   Final    NO GROWTH 3 DAYS Performed at [facility]    Report Status PENDING  Incomplete  MRSA PCR Screening     Status: None   Collection Time: 11/11/15 10:19 AM  Result Value Ref Range Status   MRSA by PCR NEGATIVE NEGATIVE Final    Comment:        The GeneXpert MRSA Assay (FDA approved for NASAL specimens only), is one component of a comprehensive MRSA colonization surveillance program. It is not intended to diagnose MRSA infection nor to guide or monitor  treatment for MRSA infections.      Studies: No results found.  Scheduled Meds: . sodium chloride   Intravenous Once  . ceFEPime (MAXIPIME) IV  2 g Intravenous 3 times per day  . fentaNYL  25 mcg Transdermal Q72H  . fluconazole (DIFLUCAN) IV  200 mg Intravenous Q24H  . metoprolol  2.5 mg Intravenous 4 times per day  . neomycin-bacitracin-polymyxin  1 application  Topical Daily  . nystatin  5 mL Oral QID  . oxyCODONE  20 mg Oral 3 times per day  . sodium chloride  3 mL Intravenous Q12H  . Tbo-Filgrastim  300 mcg Subcutaneous q1800  . tiotropium  18 mcg Inhalation Daily  . vancomycin  1,000 mg Intravenous Q8H    Continuous Infusions:    Time spent: 36mns  Humaira Sculley MD, PhD  [facility] Pager [phone] If 7PM-7AM, please contact night-coverage at [email or web address], password [email or web address]1/01/2016, 8:40 AM  LOS: 5 days

## 2015-11-15 NOTE — Progress Notes (Signed)
[facility] ICU medical director rounds  Patient: Andrew Shaw MR: [id number]  After reviewing the chart and discussing the patient with nursing staff in the ICU, it does not appear that the patient has a condition that requires ICU or step down level of care.  In an effort to best utilize system resources I have written orders to transfer the patient to a non-ICU/SDU inpatient unit.  Roselie Awkward, MD [facility] Pager: [phone] Cell: [phone] After 3pm or if no response, call [phone]  11/15/2015'@8'$ :38 AM

## 2015-11-15 NOTE — Progress Notes (Signed)
Patient transferred from ICU. Alert and orientedx3. Denies pain.  Placed comfortably in bed. Will continue to monitor.

## 2015-11-15 NOTE — Progress Notes (Signed)
Date: November 15, 2015 Chart reviewed for concurrent status and case management needs. Will continue to follow patient for changes and needs: Velva Harman, RN, BSN, [place]   [phone]

## 2015-11-16 ENCOUNTER — Telehealth: Payer: Self-pay | Admitting: Medical Oncology

## 2015-11-16 ENCOUNTER — Ambulatory Visit: Payer: Medicare Other

## 2015-11-16 DIAGNOSIS — E785 Hyperlipidemia, unspecified: Secondary | ICD-10-CM

## 2015-11-16 DIAGNOSIS — J438 Other emphysema: Secondary | ICD-10-CM

## 2015-11-16 LAB — COMPREHENSIVE METABOLIC PANEL
ALT: 50 U/L (ref 17–63)
ANION GAP: 6 (ref 5–15)
AST: 24 U/L (ref 15–41)
Albumin: 2.3 g/dL — ABNORMAL LOW (ref 3.5–5.0)
Alkaline Phosphatase: 71 U/L (ref 38–126)
BILIRUBIN TOTAL: 0.3 mg/dL (ref 0.3–1.2)
BUN: 10 mg/dL (ref 6–20)
CHLORIDE: 102 mmol/L (ref 101–111)
CO2: 26 mmol/L (ref 22–32)
Calcium: 8.3 mg/dL — ABNORMAL LOW (ref 8.9–10.3)
Creatinine, Ser: 0.68 mg/dL (ref 0.61–1.24)
Glucose, Bld: 108 mg/dL — ABNORMAL HIGH (ref 65–99)
POTASSIUM: 3.5 mmol/L (ref 3.5–5.1)
Sodium: 134 mmol/L — ABNORMAL LOW (ref 135–145)
TOTAL PROTEIN: 5.8 g/dL — AB (ref 6.5–8.1)

## 2015-11-16 LAB — CBC WITH DIFFERENTIAL/PLATELET
BASOS ABS: 0 10*3/uL (ref 0.0–0.1)
Basophils Relative: 0 %
EOS PCT: 2 %
Eosinophils Absolute: 0.1 10*3/uL (ref 0.0–0.7)
HEMATOCRIT: 25.5 % — AB (ref 39.0–52.0)
HEMOGLOBIN: 8.6 g/dL — AB (ref 13.0–17.0)
LYMPHS ABS: 0.7 10*3/uL (ref 0.7–4.0)
LYMPHS PCT: 29 %
MCH: 31.9 pg (ref 26.0–34.0)
MCHC: 33.7 g/dL (ref 30.0–36.0)
MCV: 94.4 fL (ref 78.0–100.0)
MONOS PCT: 13 %
Monocytes Absolute: 0.3 10*3/uL (ref 0.1–1.0)
Neutro Abs: 1.4 10*3/uL — ABNORMAL LOW (ref 1.7–7.7)
Neutrophils Relative %: 56 %
Platelets: 25 10*3/uL — CL (ref 150–400)
RBC: 2.7 MIL/uL — AB (ref 4.22–5.81)
RDW: 17.2 % — ABNORMAL HIGH (ref 11.5–15.5)
WBC: 2.5 10*3/uL — AB (ref 4.0–10.5)

## 2015-11-16 LAB — CULTURE, BLOOD (ROUTINE X 2)
CULTURE: NO GROWTH
CULTURE: NO GROWTH

## 2015-11-16 LAB — CBC
HEMATOCRIT: 22.1 % — AB (ref 39.0–52.0)
Hemoglobin: 7.6 g/dL — ABNORMAL LOW (ref 13.0–17.0)
MCH: 32.6 pg (ref 26.0–34.0)
MCHC: 34.4 g/dL (ref 30.0–36.0)
MCV: 94.8 fL (ref 78.0–100.0)
Platelets: 33 10*3/uL — ABNORMAL LOW (ref 150–400)
RBC: 2.33 MIL/uL — AB (ref 4.22–5.81)
RDW: 17.6 % — AB (ref 11.5–15.5)
WBC: 1.7 10*3/uL — AB (ref 4.0–10.5)

## 2015-11-16 LAB — PREPARE PLATELET PHERESIS: UNIT DIVISION: 0

## 2015-11-16 LAB — PREPARE RBC (CROSSMATCH)

## 2015-11-16 MED ORDER — SODIUM CHLORIDE 0.9 % IV SOLN
Freq: Once | INTRAVENOUS | Status: AC
  Administered 2015-11-16: 12:00:00 via INTRAVENOUS

## 2015-11-16 MED ORDER — FENTANYL 25 MCG/HR TD PT72: 25.0000 ug | MEDICATED_PATCH | TRANSDERMAL | Status: DC

## 2015-11-16 MED ORDER — LEVOFLOXACIN 750 MG PO TABS: 750.0000 mg | ORAL_TABLET | Freq: Every day | ORAL | Status: DC

## 2015-11-16 MED ORDER — NYSTATIN 100000 UNIT/ML MT SUSP: 5.0000 mL | Freq: Four times a day (QID) | OROMUCOSAL | Status: DC

## 2015-11-16 MED ORDER — MAGIC MOUTHWASH W/LIDOCAINE
15.0000 mL | Freq: Four times a day (QID) | ORAL | Status: DC | PRN
Start: 2015-11-16 — End: 2016-04-16

## 2015-11-16 NOTE — Care Management Important Message (Signed)
Important Message  Patient Details IM Letter given to Nora/Case Manager to present to Patient Name: Andrew Shaw MRN: [id number] Date of Birth: 1946/01/27   Medicare Important Message Given:  Yes    Camillo Flaming 11/16/2015, 10:19 AMImportant Message  Patient Details  Name: Andrew Shaw MRN: [id number] Date of Birth: [birth year]   Medicare Important Message Given:  Yes    Camillo Flaming 11/16/2015, 10:19 AM

## 2015-11-16 NOTE — Discharge Summary (Signed)
Physician Discharge Summary  Andrew Shaw WUJ:[id number] DOB: [birth year] DOA: 11/10/2015  PCP: [name], MD  Admit date: 11/10/2015 Discharge date: 11/16/2015  Time spent: 20 minutes  Recommendations for Outpatient Follow-up:  1. Follow up with PCP in 2-3 weeks 2. Follow up with Dr. Julien Nordmann as scheduled   Discharge Diagnoses:  Principal Problem:   Pancytopenia due to chemotherapy [facility]) Active Problems:   Dyslipidemia   HTN (hypertension)   S/P angioplasty with stent, BMS to LCX  12/23/12   Dehydration   Esophagitis   Neutropenic fever (HCC)   COPD (chronic obstructive pulmonary disease) ([place])   Discharge Condition: Stable  Diet recommendation: Regular  Filed Weights   11/12/15 0500 11/13/15 0343 11/15/15 0413  Weight: 80.2 kg (176 lb 12.9 oz) 81.4 kg (179 lb 7.3 oz) 83.1 kg (183 lb 3.2 oz)    History of present illness:  Please review dictated H and P from 12/29 for details. Briefly, 70 y.o. male with a past medical history of stage IV small cell lung carcinoma (carboplatin and etoposide, status post 4 cycles), COPD, CAD, hypertension, hyperlipidemia, chronic back pain, BPH, DVT, GERD who was referred from the [facility] today due to chemotherapy-induced pancytopenia. Pt was noted to be febrile and subsequently admitted for further work up.  Hospital Course:  Chemo related pancytopenia:  -Plt count of less than 5 , hgb 6.6 on admission -Patient remained without active bleed, fall precaution.  -On day of d/c, plts of 33k and hgb of 7.6 with WBC 1.7. D/w Dr. Julien Nordmann who recommends blood transfusion before discharge. -During this admit, pt required total of 2 units of plt and 3 units of prbc  Neutropenic fever:  -Had been started on vanc/cefepime/diflucan since admission.  -Last dose of neulast on 12/23, talked to oncology Dr. Julien Nordmann who recommended neupogen 300SC qd -Pt improving, thus narrow abx to levofloxacin on discharge.  Mucositis/esophagitis:   -Continued topical nystatin solution, diflucan -Patient ultimately tolerated diet as tolerated -Continued pain control, continue ivf  Hypokalemia/hypomagnesemia:  -replaced  Elevated lft:  -RUQ [place] no acute findings,  -hepatitis panel negative,  -lft improving  Small cell lung cancer:  -defer to oncology   CAD s/p BMS yo lcx in 2014,  -Cont beta blocker  HTN:  -continue betablocker,  -may restart home meds irbesartan if bp start to go up.  Pruritic erythematous rash  -continued topical corticosteroid.  Cancer pain:  -continue home regimen,  -added prn for breakthrough pain, stool softener.  Consultations:  Oncology  Discharge Exam: Filed Vitals:   11/16/15 1000 11/16/15 1410 11/16/15 1600 11/16/15 1626  BP: 134/52 142/57 132/65 138/70  Pulse: 70 65 71 72  Temp: 98 F (36.7 C) 98.2 F (36.8 C) 98.1 F (36.7 C) 98.1 F (36.7 C)  TempSrc: Oral Oral Oral Oral  Resp: '16 18 18 18  '$ Height:      Weight:      SpO2:  98% 97% 94%    General: Awake, in nad Cardiovascular: regular, s1, s2 Respiratory: normal resp effort, no wheezing  Discharge Instructions     Medication List    STOP taking these medications        irbesartan 150 MG tablet  Commonly known as:  AVAPRO      TAKE these medications        aspirin EC 81 MG tablet  Take 81 mg by mouth daily.     fenofibrate 48 MG tablet  Commonly known as:  TRICOR  Take 48 mg by  mouth daily.     fentaNYL 25 MCG/HR patch  Commonly known as:  DURAGESIC - dosed mcg/hr  Place 1 patch (25 mcg total) onto the skin every 3 (three) days.     fluticasone 50 MCG/ACT nasal spray  Commonly known as:  FLONASE  Place 2 sprays into both nostrils daily.     HYDROcodone-acetaminophen 10-325 MG tablet  Commonly known as:  NORCO  Take 1 tablet by mouth every 4 (four) hours as needed (breakthrough pain).     levofloxacin 750 MG tablet  Commonly known as:  LEVAQUIN  Take 1 tablet (750 mg total) by mouth daily.      lidocaine-prilocaine cream  Commonly known as:  EMLA  Apply one application to port a cath 1-2 hours prior to access.     magic mouthwash w/lidocaine Soln  Take 15 mLs by mouth 4 (four) times daily as needed for mouth pain.     metoprolol 50 MG tablet  Commonly known as:  LOPRESSOR  TAKE 1 TABLET BY MOUTH TWICE DAILY     mometasone 0.1 % cream  Commonly known as:  ELOCON  Apply 1 application topically daily as needed (for rash).     neomycin-bacitracin-polymyxin ointment  Commonly known as:  NEOSPORIN  Apply 1 application topically daily.     nitroGLYCERIN 0.4 MG SL tablet  Commonly known as:  NITROSTAT  Place 1 tablet (0.4 mg total) under the tongue every 5 (five) minutes x 3 doses as needed for chest pain.     nystatin 100000 UNIT/ML suspension  Commonly known as:  MYCOSTATIN  Take 5 mLs (500,000 Units total) by mouth 4 (four) times daily.     OXYCONTIN 20 mg 12 hr tablet  Generic drug:  oxyCODONE  Take 20 mg by mouth every 8 (eight) hours.     prochlorperazine 10 MG tablet  Commonly known as:  COMPAZINE  Take 1 tablet (10 mg total) by mouth every 6 (six) hours as needed for nausea or vomiting.     rosuvastatin 20 MG tablet  Commonly known as:  CRESTOR  Take 20 mg by mouth daily.     tiotropium 18 MCG inhalation capsule  Commonly known as:  SPIRIVA  Place 1 capsule (18 mcg total) into inhaler and inhale daily.       Allergies  Allergen Reactions  . Cortisone Other (See Comments)    MI  . Latex Rash  . Lisinopril Rash and Other (See Comments)    Swelling of face  . Other Rash    Dr Malachi Bonds  . Antihistamines, Chlorpheniramine-Type Itching  . Flexeril [Cyclobenzaprine] Itching      The results of significant diagnostics from this hospitalization (including imaging, microbiology, ancillary and laboratory) are listed below for reference.    Significant Diagnostic Studies: Ct Chest W Contrast  10/20/2015  CLINICAL DATA:  Restaging lung cancer. Initial  diagnosis June 2016. Chemotherapy in progress. EXAM: CT CHEST WITH CONTRAST TECHNIQUE: Multidetector CT imaging of the chest was performed during intravenous contrast administration. CONTRAST:  48m OMNIPAQUE IOHEXOL 300 MG/ML  SOLN COMPARISON:  CT scan 09/23/2015 and PET-CT 08/03/2015 FINDINGS: Mediastinum/Nodes: No chest wall mass, supraclavicular or axillary lymphadenopathy. Small scattered lymph nodes are noted. The largest node in the left axilla measures 7.5 mm and is unchanged. A right-sided Port-A-Cath is noted. No complicating features. Stable small left thyroid lobe nodule. The heart is normal in size. No pericardial effusion. The aorta is normal in caliber. No dissection. Stable atherosclerotic calcifications. Stable coronary artery  calcifications. Stable small scattered mediastinal and hilar lymph nodes. Prevascular lymph node on image number 21 measures 6.5 mm and is unchanged. 8 mm right paratracheal node on image 23 is stable also. 7 mm subcarinal lymph node on image 32 previously measured 10 mm. Lungs/Pleura: Stable mild emphysematous changes. No pulmonary lesions or metastatic pulmonary lung nodules. Dependent bibasilar subpleural atelectasis. No infiltrates or effusions. Upper abdomen: No significant findings. No evidence of metastatic disease. Musculoskeletal: No significant findings. No worrisome bone lesions. IMPRESSION: 1. Stable small scattered mediastinal and hilar lymph nodes when compared to most recent CT scan. No new adenopathy. 2. No pulmonary lesions or acute pulmonary findings. Electronically Signed   By: Marijo Sanes M.D.   On: 10/20/2015 14:29   US Abdomen Limited Ruq  11/13/2015  CLINICAL DATA:  Elevated LFTs. Hernia repair. History of hypertension, CAD, small cell lung cancer. EXAM: US ABDOMEN LIMITED - RIGHT UPPER QUADRANT COMPARISON:  09/23/2015 CT exam FINDINGS: Gallbladder: Gallbladder is mildly contracted. Gallbladder wall is 2.3 mm. No sonographic Murphy sign or  pericholecystic fluid. No gallstones identified. Common bile duct: Diameter: 3.2 mm Liver: Liver is mildly heterogeneous without definite discrete mass. IMPRESSION: 1. Gallbladder mildly contracted. No evidence for acute cholecystitis. 2. Mildly heterogeneous liver. No discrete mass. Possible focal hepatic steatosis. Electronically Signed   By: Nolon Nations M.D.   On: 11/13/2015 12:17    Microbiology: Recent Results (from the past 240 hour(s))  Culture, blood (Routine X 2) w Reflex to ID Panel     Status: None   Collection Time: 11/10/15  9:38 PM  Result Value Ref Range Status   Specimen Description BLOOD LEFT HAND  Final   Special Requests BOTTLES DRAWN AEROBIC AND ANAEROBIC 10CC  Final   Culture   Final    NO GROWTH 5 DAYS Performed at [facility]    Report Status 11/16/2015 FINAL  Final  Culture, blood (Routine X 2) w Reflex to ID Panel     Status: None   Collection Time: 11/10/15  9:45 PM  Result Value Ref Range Status   Specimen Description BLOOD LEFT ARM  Final   Special Requests BOTTLES DRAWN AEROBIC AND ANAEROBIC [place]  Final   Culture   Final    NO GROWTH 5 DAYS Performed at [facility]    Report Status 11/16/2015 FINAL  Final  MRSA PCR Screening     Status: None   Collection Time: 11/11/15 10:19 AM  Result Value Ref Range Status   MRSA by PCR NEGATIVE NEGATIVE Final    Comment:        The GeneXpert MRSA Assay (FDA approved for NASAL specimens only), is one component of a comprehensive MRSA colonization surveillance program. It is not intended to diagnose MRSA infection nor to guide or monitor treatment for MRSA infections.      Labs: Basic Metabolic Panel:  Recent Labs Lab 11/11/15 0405 11/12/15 0400 11/13/15 0445 11/14/15 0530 11/15/15 0610 11/16/15 0457  NA 134* 136 135 134* 130* 134*  K 3.8 3.9 3.6 3.6 3.4* 3.5  CL 107 108 106 104 100* 102  CO2 20* '23 23 22 24 26  '$ GLUCOSE 125* 102* 94 104* 106* 108*  BUN 23* 27* 21* '15 11 10   '$ CREATININE 0.97 0.84 0.75 0.63 0.61 0.68  CALCIUM 8.6* 8.7* 8.6* 8.6* 8.5* 8.3*  MG 1.7  --   --  1.8 1.6*  --   PHOS 2.0*  --   --   --   --   --  Liver Function Tests:  Recent Labs Lab 11/12/15 0400 11/13/15 0445 11/14/15 0530 11/15/15 0610 11/16/15 0457  AST 44* 172* 50* 28 24  ALT 49 170* 102* 67* 50  ALKPHOS 48 92 86 75 71  BILITOT 1.7* 1.3* 0.8 0.8 0.3  PROT 6.4* 6.2* 6.2* 5.9* 5.8*  ALBUMIN 2.7* 2.5* 2.6* 2.4* 2.3*   No results for input(s): LIPASE, AMYLASE in the last 168 hours. No results for input(s): AMMONIA in the last 168 hours. CBC:  Recent Labs Lab 11/10/15 1425 11/11/15 0405  11/12/15 0400 11/13/15 0445 11/13/15 1210 11/14/15 0530 11/15/15 0610 11/16/15 0457  WBC 0.3* 0.2*  < > 0.6* 0.7* 0.9* 0.8* 1.1* 1.7*  NEUTROABS 0.1* 0.0*  --  0.1* 0.1*  --   --   --   --   HGB 7.4* 6.6*  < > 8.3* 8.3* 8.3* 8.4* 8.0* 7.6*  HCT 21.6* 19.4*  < > 23.9* 24.0* 24.0* 24.1* 23.5* 22.1*  MCV 97.7 100.0  < > 92.6 93.4 93.0 93.1 94.4 94.8  PLT 5* <5*  < > 28* 17* 13* 10* 7* 33*  < > = values in this interval not displayed. Cardiac Enzymes: No results for input(s): CKTOTAL, CKMB, CKMBINDEX, TROPONINI in the last 168 hours. BNP: BNP (last 3 results)  Recent Labs  01/08/15 1617  BNP 58.2    ProBNP (last 3 results) No results for input(s): PROBNP in the last 8760 hours.  CBG: No results for input(s): GLUCAP in the last 168 hours.   Signed:  Inza Mikrut, Orpah Melter  [facility] 11/16/2015, 6:09 PM

## 2015-11-16 NOTE — Telephone Encounter (Signed)
Wife called and asking if pt should kep appt on Monday. i told her yes.

## 2015-11-16 NOTE — Progress Notes (Signed)
Physical Therapy Treatment Patient Details Name: Andrew Shaw MRN: [id number] DOB: 21-Nov-1945 Today's Date: 11/16/2015    History of Present Illness 70 yo male with history of lung cancer,  admitted from  cancer center  11/12/15 with chemo induced pancytopenia and oral thrush.    PT Comments    Patient reports that he is ambulating with his daughter. demonstrates mild balance losses without UE support. Recommend HHPT for safety. No family present to discuss.  Follow Up Recommendations  Home health PT;Supervision/Assistance - 24 hour     Equipment Recommendations  None recommended by PT    Recommendations for Other Services       Precautions / Restrictions Precautions Precautions: Fall Restrictions Weight Bearing Restrictions: No    Mobility  Bed Mobility Overal bed mobility: Independent                Transfers   Equipment used: 1 person hand held assist Transfers: Sit to/from Stand Sit to Stand: Supervision            Ambulation/Gait Ambulation/Gait assistance: Min guard Ambulation Distance (Feet): 440 Feet   Gait Pattern/deviations: Step-to pattern;Drifts right/left     General Gait Details: initially provided HHA,  demostrates decreased balance/swaying without UE support. steady assist at times for balance.   Stairs            Wheelchair Mobility    Modified Rankin (Stroke Patients Only)       Balance           Standing balance support: No upper extremity supported;During functional activity Standing balance-Leahy Scale: Poor                      Cognition Arousal/Alertness: Awake/alert   Overall Cognitive Status: No family/caregiver present to determine baseline cognitive functioning Area of Impairment: Orientation               General Comments: disgruntled because of the bed alarm.    Exercises      General Comments        Pertinent Vitals/Pain Pain Assessment: No/denies pain    Home Living                       Prior Function            PT Goals (current goals can now be found in the care plan section) Progress towards PT goals: Progressing toward goals    Frequency  Min 3X/week    PT Plan      Co-evaluation             End of Session Equipment Utilized During Treatment: Gait belt Activity Tolerance: Patient tolerated treatment well Patient left: in bed;with bed alarm set;with call bell/phone within reach     Time: 0921-0934 PT Time Calculation (min) (ACUTE ONLY): 13 min  Charges:  $Gait Training: 8-22 mins                    G Codes:      [name] 11/16/2015, 11:08 AM Tresa Endo PT [phone]

## 2015-11-16 NOTE — Care Management Note (Signed)
Case Management Note  Patient Details  Name: ELSTON ALDAPE MRN: [id number] Date of Birth: 08/06/1946  Subjective/Objective:             71 yo admitted with Pancytopenia due to chemotherapy       Action/Plan: From home with spouse.  Expected Discharge Date:   (unknown)               Expected Discharge Plan:  Home/Self Care  In-House Referral:  NA  Discharge planning Services  CM Consult  Post Acute Care Choice:    Choice offered to:     DME Arranged:    DME Agency:     HH Arranged:    HH Agency:     Status of Service:  In process, will continue to follow  Medicare Important Message Given:  Yes Date Medicare IM Given:    Medicare IM give by:    Date Additional Medicare IM Given:    Additional Medicare Important Message give by:     If discussed at [place] of Stay Meetings, dates discussed:    Additional Comments: PT is recommending HHPT at discharge. This CM met with pt to offer choice for [facility] services. Pt politely declines [place] services at this time. He states he has a walker at home and he is moving around fine. [facility] provider list left with pt and CM will continue to follow. No other CM needs communicated. Lynnell Catalan, RN 11/16/2015, 3:07 PM

## 2015-11-17 ENCOUNTER — Ambulatory Visit: Payer: Medicare Other

## 2015-11-17 ENCOUNTER — Other Ambulatory Visit: Payer: Self-pay | Admitting: Cardiovascular Disease

## 2015-11-17 LAB — TYPE AND SCREEN
ABO/RH(D): A POS
ANTIBODY SCREEN: NEGATIVE
UNIT DIVISION: 0

## 2015-11-17 NOTE — Telephone Encounter (Signed)
Rx request sent to pharmacy.  

## 2015-11-18 ENCOUNTER — Other Ambulatory Visit: Payer: Self-pay | Admitting: *Deleted

## 2015-11-18 DIAGNOSIS — C3490 Malignant neoplasm of unspecified part of unspecified bronchus or lung: Secondary | ICD-10-CM

## 2015-11-19 ENCOUNTER — Ambulatory Visit: Payer: Medicare Other

## 2015-11-21 ENCOUNTER — Telehealth: Payer: Self-pay | Admitting: Internal Medicine

## 2015-11-21 ENCOUNTER — Encounter: Payer: Self-pay | Admitting: Internal Medicine

## 2015-11-21 ENCOUNTER — Other Ambulatory Visit (HOSPITAL_BASED_OUTPATIENT_CLINIC_OR_DEPARTMENT_OTHER): Payer: Medicare Other

## 2015-11-21 ENCOUNTER — Ambulatory Visit: Payer: Medicare Other

## 2015-11-21 ENCOUNTER — Ambulatory Visit (HOSPITAL_BASED_OUTPATIENT_CLINIC_OR_DEPARTMENT_OTHER): Payer: Medicare Other | Admitting: Internal Medicine

## 2015-11-21 VITALS — BP 163/80 | HR 99 | Temp 98.3°F | Resp 17 | Ht 65.0 in | Wt 187.2 lb

## 2015-11-21 DIAGNOSIS — C3491 Malignant neoplasm of unspecified part of right bronchus or lung: Secondary | ICD-10-CM | POA: Diagnosis not present

## 2015-11-21 DIAGNOSIS — D61810 Antineoplastic chemotherapy induced pancytopenia: Secondary | ICD-10-CM

## 2015-11-21 DIAGNOSIS — C3490 Malignant neoplasm of unspecified part of unspecified bronchus or lung: Secondary | ICD-10-CM

## 2015-11-21 DIAGNOSIS — D6481 Anemia due to antineoplastic chemotherapy: Secondary | ICD-10-CM | POA: Diagnosis not present

## 2015-11-21 DIAGNOSIS — Z5111 Encounter for antineoplastic chemotherapy: Secondary | ICD-10-CM

## 2015-11-21 DIAGNOSIS — R530 Neoplastic (malignant) related fatigue: Secondary | ICD-10-CM | POA: Diagnosis not present

## 2015-11-21 LAB — CBC WITH DIFFERENTIAL/PLATELET
BASO%: 0.5 % (ref 0.0–2.0)
BASOS ABS: 0 10*3/uL (ref 0.0–0.1)
EOS ABS: 0 10*3/uL (ref 0.0–0.5)
EOS%: 0.9 % (ref 0.0–7.0)
HCT: 29 % — ABNORMAL LOW (ref 38.4–49.9)
HGB: 9.6 g/dL — ABNORMAL LOW (ref 13.0–17.1)
LYMPH%: 18.4 % (ref 14.0–49.0)
MCH: 31.6 pg (ref 27.2–33.4)
MCHC: 33.1 g/dL (ref 32.0–36.0)
MCV: 95.5 fL (ref 79.3–98.0)
MONO#: 0.4 10*3/uL (ref 0.1–0.9)
MONO%: 11.8 % (ref 0.0–14.0)
NEUT%: 68.4 % (ref 39.0–75.0)
NEUTROS ABS: 2.4 10*3/uL (ref 1.5–6.5)
PLATELETS: 28 10*3/uL — AB (ref 140–400)
RBC: 3.03 10*6/uL — AB (ref 4.20–5.82)
RDW: 19.7 % — ABNORMAL HIGH (ref 11.0–14.6)
WBC: 3.5 10*3/uL — AB (ref 4.0–10.3)
lymph#: 0.6 10*3/uL — ABNORMAL LOW (ref 0.9–3.3)

## 2015-11-21 LAB — COMPREHENSIVE METABOLIC PANEL
ALK PHOS: 94 U/L (ref 40–150)
ALT: 31 U/L (ref 0–55)
ANION GAP: 10 meq/L (ref 3–11)
AST: 23 U/L (ref 5–34)
Albumin: 3.1 g/dL — ABNORMAL LOW (ref 3.5–5.0)
BILIRUBIN TOTAL: 0.42 mg/dL (ref 0.20–1.20)
BUN: 6.2 mg/dL — ABNORMAL LOW (ref 7.0–26.0)
CO2: 23 meq/L (ref 22–29)
Calcium: 9.6 mg/dL (ref 8.4–10.4)
Chloride: 103 mEq/L (ref 98–109)
Creatinine: 0.8 mg/dL (ref 0.7–1.3)
EGFR: 89 mL/min/{1.73_m2} — AB (ref >90)
Glucose: 120 mg/dl (ref 70–140)
POTASSIUM: 4.4 meq/L (ref 3.5–5.1)
Sodium: 136 mEq/L (ref 136–145)
TOTAL PROTEIN: 7.9 g/dL (ref 6.4–8.3)

## 2015-11-21 NOTE — Progress Notes (Signed)
[place] [phone]   [phone]  OFFICE PROGRESS NOTE  [name], [place] [address] [place] [address]  DIAGNOSIS: Extensive stage (TX, N2, M1b) small cell lung cancer in September 2016 presenting with right hilar and mediastinal lymphadenopathy as well as highly suspicious metastatic disease in the axilla bilaterally.   PRIOR THERAPY: None  CURRENT THERAPY: Systemic chemotherapy with carboplatin for AUC of 5 on day 1 and etoposide 120 MG/M2 on days one 2, 3 with Neulasta support on day 4. Status post 4 cycles.  INTERVAL HISTORY: Andrew Shaw 70 y.o. male returns to the clinic today for follow-up visit accompanied by his wife and daughter. He is feeling fine today except for mild fatigue. He completed 4 cycles of systemic chemotherapy was carboplatin and etoposide. He was recently admitted to [facility] with chemotherapy induced pancytopenia including neutropenic fever and platelets count less than 5000. He also had sore throat and oral thrush at that time treated with Diflucan. He is feeling much better today. Repeat CT scan of the chest last month showed no evidence for disease progression. The patient denied having any significant bleeding issues. He denied having any chest pain, shortness of breath, cough or hemoptysis. He denied having any significant nausea or vomiting. No significant weight loss or night sweats. He has no fever or chills. He is here today for evaluation and recommendation regarding treatment of his condition.  MEDICAL HISTORY: Past Medical History  Diagnosis Date  . Coronary artery disease     2D ECHO, 10/31/2010 - EF >55%, normal; NUCLEAR STRESS TEST, 10/23/2010 - perfusion defect in inferior myocardial region, post-stress EF 62%, EKG negative for ischemia  . Hypertension   . Hyperlipemia   . Chronic back pain   . DVT (deep venous thrombosis) ([place])     history 2004 after knee surg  .  BPH (benign prostatic hyperplasia)   . Arthritis   . Neuromuscular disorder ([place])   . Carpal tunnel syndrome   . 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 GERD (gastroesophageal reflux disease)   . Myocardial infarction ([place]) 2005    from steroids  . S/P angioplasty with stent, BMS to LCX  12/23/12 12/23/2012  . Back pain 12/23/2012  . Shortness of breath     with exertion  . COPD (chronic obstructive pulmonary disease) ([place])   . Carpal tunnel syndrome, right   . Skin rash 08/22/2015    ALLERGIES:  is allergic to cortisone; latex; lisinopril; other; antihistamines, chlorpheniramine-type; and flexeril.  MEDICATIONS:  Current Outpatient Prescriptions  Medication Sig Dispense Refill  . aspirin EC 81 MG tablet Take 81 mg by mouth daily.    . fenofibrate (TRICOR) 48 MG tablet Take 48 mg by mouth daily.    . fentaNYL (DURAGESIC - DOSED MCG/HR) 25 MCG/HR patch Place 1 patch (25 mcg total) onto the skin every 3 (three) days. 5 patch 0  . fluticasone (FLONASE) 50 MCG/ACT nasal spray Place 2 sprays into both nostrils daily. (Patient taking differently: Place 2 sprays into both nostrils daily as needed for allergies. ) 16 g 2  . HYDROcodone-acetaminophen (NORCO) 10-325 MG per tablet Take 1 tablet by mouth every 4 (four) hours as needed (breakthrough pain).     . irbesartan (AVAPRO) 150 MG tablet Take 1 tablet (150 mg total) by mouth daily. PLEASE SCHEDULE APPOINTMENT. 90 tablet 0  . levofloxacin (LEVAQUIN) 750 MG tablet Take 1  tablet (750 mg total) by mouth daily. 2 tablet 0  . lidocaine-prilocaine (EMLA) cream Apply one application to port a cath 1-2 hours prior to access. 30 g 2  . magic mouthwash w/lidocaine SOLN Take 15 mLs by mouth 4 (four) times daily as needed for mouth pain.  0  . metoprolol (LOPRESSOR) 50 MG tablet TAKE 1 TABLET BY MOUTH TWICE DAILY (Patient taking differently: TAKE 50 MG BY MOUTH TWICE DAILY) 60 tablet 9  . mometasone (ELOCON) 0.1 % cream Apply 1 application  topically daily as needed (for rash).   3  . neomycin-bacitracin-polymyxin (NEOSPORIN) ointment Apply 1 application topically daily.    . nitroGLYCERIN (NITROSTAT) 0.4 MG SL tablet Place 1 tablet (0.4 mg total) under the tongue every 5 (five) minutes x 3 doses as needed for chest pain. 25 tablet 2  . nystatin (MYCOSTATIN) 100000 UNIT/ML suspension Take 5 mLs (500,000 Units total) by mouth 4 (four) times daily. 60 mL 0  . OXYCONTIN 20 MG T12A 12 hr tablet Take 20 mg by mouth every 8 (eight) hours.  0  . prochlorperazine (COMPAZINE) 10 MG tablet Take 1 tablet (10 mg total) by mouth every 6 (six) hours as needed for nausea or vomiting. 30 tablet 1  . rosuvastatin (CRESTOR) 20 MG tablet Take 20 mg by mouth daily.    Marland Kitchen tiotropium (SPIRIVA) 18 MCG inhalation capsule Place 1 capsule (18 mcg total) into inhaler and inhale daily. 30 capsule 6   No current facility-administered medications for this visit.    SURGICAL HISTORY:  Past Surgical History  Procedure Laterality Date  . Knee arthroscopy  04,06    left  . Joint replacement Left 2008    lt total knee  . Manipulation knee joint Left 2009    closed lt knee   . Hernia repair  2008    umb   . Cervical fusion  1999  . Back surgery  2004    lumb fusion  . Epidural block injection      multiple lumbar  . Carpal tunnel release  04/15/2012    Procedure: CARPAL TUNNEL RELEASE;  Surgeon: Wynonia Sours, MD;  Location: [place];  Service: Orthopedics;  Laterality: Left;  . Trigger finger release  04/15/2012    Procedure: RELEASE TRIGGER FINGER/A-1 PULLEY;  Surgeon: Wynonia Sours, MD;  Location: [place];  Service: Orthopedics;  Laterality: Left;  left thumb and little finger  . Cardiac catheterization  12/23/2012    Mid nondominant AV groove circumflex stented with a 2.5x60m Mini Vision stent resulting in a reduction of 90% stenosis to 0% residual  . Cardiac catheterization  02/07/2004    Noncritical CAD, continue medical  therapy  . Cardiac catheterization  02/03/1999    Recommended medical therapy  . Left heart catheterization with coronary angiogram N/A 12/23/2012    Procedure: LEFT HEART CATHETERIZATION WITH CORONARY ANGIOGRAM;  Surgeon: JLorretta Harp MD;  [facility];  Service: Cardiovascular;  Laterality: N/A;  . Percutaneous coronary stent intervention (pci-s)  12/23/2012    Procedure: PERCUTANEOUS CORONARY STENT INTERVENTION (PCI-S);  Surgeon: JLorretta Harp MD;  [facility];  Service: Cardiovascular;;  . Left heart catheterization with coronary angiogram N/A 01/11/2015    Procedure: LEFT HEART CATHETERIZATION WITH CORONARY ANGIOGRAM;  Surgeon: Peter M JMartinique MD;  [facility];  Service: Cardiovascular;  Laterality: N/A;  . Colonoscopy w/ polypectomy    . Video bronchoscopy with endobronchial ultrasound N/A 07/28/2015  Procedure: VIDEO BRONCHOSCOPY WITH ENDOBRONCHIAL ULTRASOUND;  Surgeon: [name], MD;  Location: [place];  Service: Thoracic;  Laterality: N/A;    REVIEW OF SYSTEMS:  Constitutional: positive for fatigue Eyes: negative Ears, nose, mouth, throat, and face: positive for sore mouth Respiratory: negative Cardiovascular: negative Gastrointestinal: negative Genitourinary:negative Integument/breast: negative Hematologic/lymphatic: negative Musculoskeletal:negative Neurological: negative Behavioral/Psych: negative Endocrine: negative Allergic/Immunologic: negative   PHYSICAL EXAMINATION: General appearance: alert, cooperative, fatigued and no distress Head: Normocephalic, without obvious abnormality, atraumatic Neck: no adenopathy, no JVD, supple, symmetrical, trachea midline and thyroid not enlarged, symmetric, no tenderness/mass/nodules Lymph nodes: Cervical, supraclavicular, and axillary nodes normal. Resp: clear to auscultation bilaterally Back: symmetric, no curvature. ROM normal. No CVA tenderness. Cardio: regular rate and rhythm, S1, S2  normal, no murmur, click, rub or gallop GI: soft, non-tender; bowel sounds normal; no masses,  no organomegaly Extremities: extremities normal, atraumatic, no cyanosis or edema Neurologic: Alert and oriented X 3, normal strength and tone. Normal symmetric reflexes. Normal coordination and gait    ECOG PERFORMANCE STATUS: 1 - Symptomatic but completely ambulatory  Blood pressure 163/80, pulse 99, temperature 98.3 F (36.8 C), temperature source Oral, resp. rate 17, height '5\' 5"'$  (1.651 m), weight 187 lb 3.2 oz (84.913 kg), SpO2 99 %.  LABORATORY DATA: Lab Results  Component Value Date   WBC 3.5* 11/21/2015   HGB 9.6* 11/21/2015   HCT 29.0* 11/21/2015   MCV 95.5 11/21/2015   PLT 28* 11/21/2015      Chemistry      Component Value Date/Time   NA 134* 11/16/2015 0457   NA 129* 11/10/2015 1426   K 3.5 11/16/2015 0457   K 4.5 11/10/2015 1426   CL 102 11/16/2015 0457   CO2 26 11/16/2015 0457   CO2 19* 11/10/2015 1426   BUN 10 11/16/2015 0457   BUN 29.7* 11/10/2015 1426   CREATININE 0.68 11/16/2015 0457   CREATININE 1.3 11/10/2015 1426      Component Value Date/Time   CALCIUM 8.3* 11/16/2015 0457   CALCIUM 9.7 11/10/2015 1426   ALKPHOS 71 11/16/2015 0457   ALKPHOS 62 11/10/2015 1426   AST 24 11/16/2015 0457   AST 8 11/10/2015 1426   ALT 50 11/16/2015 0457   ALT 12 11/10/2015 1426   BILITOT 0.3 11/16/2015 0457   BILITOT 1.08 11/10/2015 1426       RADIOGRAPHIC STUDIES: US Abdomen Limited Ruq  2015-11-28  CLINICAL DATA:  Elevated LFTs. Hernia repair. History of hypertension, CAD, small cell lung cancer. EXAM: US ABDOMEN LIMITED - RIGHT UPPER QUADRANT COMPARISON:  09/23/2015 CT exam FINDINGS: Gallbladder: Gallbladder is mildly contracted. Gallbladder wall is 2.3 mm. No sonographic Murphy sign or pericholecystic fluid. No gallstones identified. Common bile duct: Diameter: 3.2 mm Liver: Liver is mildly heterogeneous without definite discrete mass. IMPRESSION: 1. Gallbladder mildly  contracted. No evidence for acute cholecystitis. 2. Mildly heterogeneous liver. No discrete mass. Possible focal hepatic steatosis. Electronically Signed   By: Nolon Nations M.D.   On: 11/28/2015 12:17    ASSESSMENT AND PLAN: This is a very pleasant 70 years old white male recently diagnosed with extensive stage small cell lung cancer currently undergoing systemic chemotherapy with carboplatin and etoposide status post 4 cycles and tolerating his treatment well except for pancytopenia.  He continues to have fatigue secondary to chemotherapy-induced anemia. His last CT scan of the chest showed no evidence for disease progression. His platelets count are still low. I had a lengthy discussion with the patient and his family about his  current disease status and treatment options. I recommended for the patient to discontinue his treatment at this point because of the significant pancytopenia after treatment and he has no evidence of disease progression after cycle #4. I will see him back for follow-up visit in 2 months for reevaluation after repeating CT scan of the chest, abdomen and pelvis for restaging of his disease. The patient and his family agreed to the current plan. The patient was advised to call immediately if he has any concerning symptoms in the interval. The patient voices understanding of current disease status and treatment options and is in agreement with the current care plan.  All questions were answered. The patient knows to call the clinic with any problems, questions or concerns. We can certainly see the patient much sooner if necessary.  Disclaimer: This note was dictated with voice recognition software. Similar sounding words can inadvertently be transcribed and may not be corrected upon review.

## 2015-11-21 NOTE — Telephone Encounter (Signed)
Patient on schedule for 3/13 already and will get a call for his scans,all other appts have been cancelled

## 2015-11-22 ENCOUNTER — Ambulatory Visit: Payer: Medicare Other

## 2015-11-23 ENCOUNTER — Ambulatory Visit: Payer: Medicare Other

## 2015-11-24 DIAGNOSIS — G894 Chronic pain syndrome: Secondary | ICD-10-CM | POA: Diagnosis not present

## 2015-11-24 DIAGNOSIS — M4726 Other spondylosis with radiculopathy, lumbar region: Secondary | ICD-10-CM | POA: Diagnosis not present

## 2015-11-24 DIAGNOSIS — Z79891 Long term (current) use of opiate analgesic: Secondary | ICD-10-CM | POA: Diagnosis not present

## 2015-11-24 DIAGNOSIS — M47812 Spondylosis without myelopathy or radiculopathy, cervical region: Secondary | ICD-10-CM | POA: Diagnosis not present

## 2015-11-25 ENCOUNTER — Ambulatory Visit: Payer: Medicare Other

## 2015-12-12 ENCOUNTER — Other Ambulatory Visit: Payer: Medicare Other

## 2015-12-12 ENCOUNTER — Ambulatory Visit: Payer: Medicare Other | Admitting: Internal Medicine

## 2015-12-22 DIAGNOSIS — Z79891 Long term (current) use of opiate analgesic: Secondary | ICD-10-CM | POA: Diagnosis not present

## 2015-12-22 DIAGNOSIS — M47812 Spondylosis without myelopathy or radiculopathy, cervical region: Secondary | ICD-10-CM | POA: Diagnosis not present

## 2015-12-22 DIAGNOSIS — M4726 Other spondylosis with radiculopathy, lumbar region: Secondary | ICD-10-CM | POA: Diagnosis not present

## 2015-12-22 DIAGNOSIS — G894 Chronic pain syndrome: Secondary | ICD-10-CM | POA: Diagnosis not present

## 2016-01-02 ENCOUNTER — Other Ambulatory Visit: Payer: Medicare Other

## 2016-01-02 ENCOUNTER — Ambulatory Visit: Payer: Medicare Other | Admitting: Internal Medicine

## 2016-01-09 DIAGNOSIS — H2513 Age-related nuclear cataract, bilateral: Secondary | ICD-10-CM | POA: Diagnosis not present

## 2016-01-12 ENCOUNTER — Ambulatory Visit (INDEPENDENT_AMBULATORY_CARE_PROVIDER_SITE_OTHER): Payer: Medicare Other | Admitting: Internal Medicine

## 2016-01-12 ENCOUNTER — Encounter: Payer: Self-pay | Admitting: Internal Medicine

## 2016-01-12 VITALS — BP 138/62 | HR 71 | Ht 66.5 in | Wt 182.4 lb

## 2016-01-12 DIAGNOSIS — R0989 Other specified symptoms and signs involving the circulatory and respiratory systems: Secondary | ICD-10-CM | POA: Diagnosis not present

## 2016-01-12 DIAGNOSIS — R9389 Abnormal findings on diagnostic imaging of other specified body structures: Secondary | ICD-10-CM

## 2016-01-12 DIAGNOSIS — R938 Abnormal findings on diagnostic imaging of other specified body structures: Secondary | ICD-10-CM

## 2016-01-12 NOTE — Patient Instructions (Signed)
ICD-9-CM ICD-10-CM   1. Abnormal CT scan, chest 793.2 R93.8   2. Chest crackles 786.7 R09.89     Please do HRCT chest supine and prone with your next CT chest that has been arranged by Dr Julien Nordmann  Followup  - Will call and discuss CT chest results  - if that shows ILD conidtion will have you come in. If not, no further followup

## 2016-01-12 NOTE — Progress Notes (Signed)
Subjective:     Patient ID: Andrew Shaw, male   DOB: 12-07-1945, 70 y.o.   MRN: [id number] PCP [name], MD  HPI    OV 01/12/2016  Chief Complaint  Patient presents with  . Follow-up    Pt states he is now doing chemo. Pt states his breathing is doing well. Pt states he did not tolerate the spiriva HH well. Pt states he is getting over a cold. Pt states he has sinus congestion and dry cough. Pt denies CP/tightness.     70 year old male last seen August 2016 in the setting of CT chest that suggested interstitial lung disease and a smoker [previous smoking] and mediastinal adenopathy. He got referred to thoracic surgery and there was a diagnosis of extensive small cell carcinoma made. Since then I'm not seen him. The biopsy was in September 2016. According to him and review of the chart shows he is undergoing chemotherapy. Last CT scan of the chest was in November and December 2016 which I personally visualized. The official report shows improving mediastinal adenopathy with chemotherapy and reports that the lung parenchyma is normal. I personally think that that might be some subtle ILD at the bases. In any event other than post chemotherapy fatigue he is feeling fine. He does not have dyspnea for his activities of daily living or cough or wheeze or hemoptysis.     has a past medical history of Coronary artery disease; Hypertension; Hyperlipemia; Chronic back pain; DVT (deep venous thrombosis) (HCC); BPH (benign prostatic hyperplasia); Arthritis; Neuromuscular disorder ([place]); Carpal tunnel syndrome; Neuropathy (HCC); GERD (gastroesophageal reflux disease); Myocardial infarction [facility]) (2005); S/P angioplasty with stent, BMS to LCX  12/23/12 (12/23/2012); Back pain (12/23/2012); Shortness of breath; COPD (chronic obstructive pulmonary disease) ([place]); Carpal tunnel syndrome, right; and Skin rash (08/22/2015).   reports that he has been smoking Cigarettes and E-cigarettes.  He has a 12.5  pack-year smoking history. He has never used smokeless tobacco.  Past Surgical History  Procedure Laterality Date  . Knee arthroscopy  04,06    left  . Joint replacement Left 2008    lt total knee  . Manipulation knee joint Left 2009    closed lt knee   . Hernia repair  2008    umb   . Cervical fusion  1999  . Back surgery  2004    lumb fusion  . Epidural block injection      multiple lumbar  . Carpal tunnel release  04/15/2012    Procedure: CARPAL TUNNEL RELEASE;  Surgeon: Wynonia Sours, MD;  Location: [place];  Service: Orthopedics;  Laterality: Left;  . Trigger finger release  04/15/2012    Procedure: RELEASE TRIGGER FINGER/A-1 PULLEY;  Surgeon: Wynonia Sours, MD;  Location: [place];  Service: Orthopedics;  Laterality: Left;  left thumb and little finger  . Cardiac catheterization  12/23/2012    Mid nondominant AV groove circumflex stented with a 2.5x54m Mini Vision stent resulting in a reduction of 90% stenosis to 0% residual  . Cardiac catheterization  02/07/2004    Noncritical CAD, continue medical therapy  . Cardiac catheterization  02/03/1999    Recommended medical therapy  . Left heart catheterization with coronary angiogram N/A 12/23/2012    Procedure: LEFT HEART CATHETERIZATION WITH CORONARY ANGIOGRAM;  Surgeon: JLorretta Harp MD;  [facility];  Service: Cardiovascular;  Laterality: N/A;  . Percutaneous coronary stent intervention (pci-s)  12/23/2012    Procedure: PERCUTANEOUS CORONARY STENT  INTERVENTION (PCI-S);  Surgeon: Lorretta Harp, MD;  [facility];  Service: Cardiovascular;;  . Left heart catheterization with coronary angiogram N/A 01/11/2015    Procedure: LEFT HEART CATHETERIZATION WITH CORONARY ANGIOGRAM;  Surgeon: Peter M Martinique, MD;  [facility]: [facility];  Service: Cardiovascular;  Laterality: N/A;  . Colonoscopy w/ polypectomy    . Video bronchoscopy with endobronchial ultrasound N/A 07/28/2015    Procedure:  VIDEO BRONCHOSCOPY WITH ENDOBRONCHIAL ULTRASOUND;  Surgeon: [name], MD;  Location: [place];  Service: Thoracic;  Laterality: N/A;    Allergies  Allergen Reactions  . Cortisone Other (See Comments)    MI  . Latex Rash  . Lisinopril Rash and Other (See Comments)    Swelling of face  . Other Rash    Dr Malachi Bonds  . Antihistamines, Chlorpheniramine-Type Itching  . Flexeril [Cyclobenzaprine] Itching    Immunization History  Administered Date(s) Administered  . Influenza,inj,Quad PF,36+ Mos 08/04/2015  . Pneumococcal-Unspecified 07/11/2015  . Zoster 01/11/2015    Family History  Problem Relation Age of Onset  . Heart disease Mother   . Hypertension Sister   . Diabetes Sister   . Cancer Brother      Current outpatient prescriptions:  .  aspirin EC 81 MG tablet, Take 81 mg by mouth daily., Disp: , Rfl:  .  fenofibrate (TRICOR) 48 MG tablet, Take 48 mg by mouth daily., Disp: , Rfl:  .  HYDROcodone-acetaminophen (NORCO) 10-325 MG per tablet, Take 1 tablet by mouth every 4 (four) hours as needed (breakthrough pain). , Disp: , Rfl:  .  hydrocortisone (CORTEF) 20 MG tablet, , Disp: , Rfl:  .  irbesartan (AVAPRO) 150 MG tablet, Take 1 tablet (150 mg total) by mouth daily. PLEASE SCHEDULE APPOINTMENT., Disp: 90 tablet, Rfl: 0 .  lidocaine-prilocaine (EMLA) cream, Apply one application to port a cath 1-2 hours prior to access., Disp: 30 g, Rfl: 2 .  magic mouthwash w/lidocaine SOLN, Take 15 mLs by mouth 4 (four) times daily as needed for mouth pain., Disp: , Rfl: 0 .  metoprolol (LOPRESSOR) 50 MG tablet, TAKE 1 TABLET BY MOUTH TWICE DAILY (Patient taking differently: TAKE 50 MG BY MOUTH TWICE DAILY), Disp: 60 tablet, Rfl: 9 .  mometasone (ELOCON) 0.1 % cream, Apply 1 application topically daily as needed (for rash). , Disp: , Rfl: 3 .  nitroGLYCERIN (NITROSTAT) 0.4 MG SL tablet, Place 1 tablet (0.4 mg total) under the tongue every 5 (five) minutes x 3 doses as needed for chest  pain., Disp: 25 tablet, Rfl: 2 .  OXYCONTIN 20 MG T12A 12 hr tablet, Take 20 mg by mouth every 8 (eight) hours., Disp: , Rfl: 0 .  prochlorperazine (COMPAZINE) 10 MG tablet, Take 1 tablet (10 mg total) by mouth every 6 (six) hours as needed for nausea or vomiting., Disp: 30 tablet, Rfl: 1 .  rosuvastatin (CRESTOR) 20 MG tablet, Take 20 mg by mouth daily., Disp: , Rfl:     Review of Systems     Objective:   Physical Exam  Constitutional: He is oriented to person, place, and time. He appears well-developed and well-nourished. No distress.  HENT:  Head: Normocephalic and atraumatic.  Right Ear: External ear normal.  Left Ear: External ear normal.  Mouth/Throat: Oropharynx is clear and moist. No oropharyngeal exudate.  Eyes: Conjunctivae and EOM are normal. Pupils are equal, round, and reactive to light. Right eye exhibits no discharge. Left eye exhibits no discharge. No scleral icterus.  Neck: Normal  range of motion. Neck supple. No JVD present. No tracheal deviation present. No thyromegaly present.  Cardiovascular: Normal rate, regular rhythm and intact distal pulses.  Exam reveals no gallop and no friction rub.   No murmur heard. Pulmonary/Chest: Effort normal. No respiratory distress. He has no wheezes. He has rales. He exhibits no tenderness.  Crackles present in the right base  Abdominal: Soft. Bowel sounds are normal. He exhibits no distension and no mass. There is no tenderness. There is no rebound and no guarding.  Musculoskeletal: Normal range of motion. He exhibits no edema or tenderness.  Lymphadenopathy:    He has no cervical adenopathy.  Neurological: He is alert and oriented to person, place, and time. He has normal reflexes. No cranial nerve deficit. Coordination normal.  Skin: Skin is warm and dry. No rash noted. He is not diaphoretic. No erythema. No pallor.  Psychiatric: He has a normal mood and affect. His behavior is normal. Judgment and thought content normal.   Nursing note and vitals reviewed.   Filed Vitals:   01/12/16 1404  BP: 138/62  Pulse: 71  Height: 5' 6.5" (1.689 m)  Weight: 182 lb 6.4 oz (82.736 kg)  SpO2: 98%        Assessment:       ICD-9-CM ICD-10-CM   1. Abnormal CT scan, chest 793.2 R93.8   2. Chest crackles 786.7 R09.89        Plan:     I am concerned that he still has ILD and normal CT chest has not picked this up. This is because in 2016 his pulmonary function tests only showed isolated reduction in diffusion capacity and a moderate level. He is asymptomatic probably because he does not exert himself much. He has right-sided crackles in the base again raising the suspicion of possible ILD. 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 Second leading causes rheumatoid arthritis related UIP. Both of these involve anti-fibrotic therapy versus CellCept. In the setting of lung cancer this might not be a good idea. In any event it would be of prognostic value. We will get a high-resolution CT chest without contrast. If this does indeed shows ILD then we'll have to have a discussion about it in the context of his extensive stage small cell cancer.  He is agreeable with the plan   (> 50% of this 15 min visit spent in face to face counseling or/and coordination of care)   Dr. Brand Males, M.D., [facility].C.P Pulmonary and Critical Care Medicine Staff Physician [place] [facility] Pager: [phone], If no answer or between  15:00h - 7:00h: call [phone]  01/12/2016 2:40 PM

## 2016-01-13 ENCOUNTER — Telehealth: Payer: Self-pay | Admitting: Internal Medicine

## 2016-01-13 NOTE — Telephone Encounter (Signed)
Noted  

## 2016-01-17 ENCOUNTER — Telehealth: Payer: Self-pay | Admitting: *Deleted

## 2016-01-17 NOTE — Telephone Encounter (Signed)
Called [facility] and [employer].  Tests have been "linked" to be performed at the same time.

## 2016-01-17 NOTE — Telephone Encounter (Signed)
Patient called [facility] stating he was confused having scans ordered by Pulmonary and he'd told them Dr. Julien Nordmann already planned scans.  Note applied to appointments to reflect need for HRCT without contrast.

## 2016-01-17 NOTE — Telephone Encounter (Signed)
I only want hium to do HRCT wo contrast to rule out pulmonary fibrosis on same day , same setting as sthe scan Dr Julien Nordmann wants. He will be in the scanner only once. He does not have to do anytihg different. I have d/w DR Julien Nordmann and he is ok with this. I wanted it done at same location as his CT chest for DR [facility] and same time. So not sure what the cnfusion is  Triage, please sort this out with Cherylynn Ridges who is copied  THanks  Dr. Brand Males, M.D., [facility].C.P Pulmonary and Critical Care Medicine Staff Physician [place] [facility] Pager: [phone], If no answer or between  15:00h - 7:00h: call [phone]  01/17/2016 2:47 PM

## 2016-01-17 NOTE — Telephone Encounter (Signed)
FYI "I have scans scheduled 01-20-2016 by Dr. Chase Caller for fluid he heard in my lungs.  I am to have scans per Dr. Julien Nordmann before I see him 01-23-2016.  I'm confused.  I want the scans Dr. Julien Nordmann ordered."  [facility] scheduling.  CT high resolution at [facility] cancelled and CT chest/abdomen both with contrast scheduled with [facility].  Pt. Instructions: NPO four hours before test.   to pick up contrast to drink at 12:00 and 1:00 pm.  Arrive at 1:45 pm for 2:00 pm CT scans. Reports he will come by to pick up contrast on Thursday.

## 2016-01-17 NOTE — Telephone Encounter (Signed)
Please ask CT to do whatever was ordered by Dr. Chase Caller. I am ok with it. Thank you. Andrew Shaw

## 2016-01-19 ENCOUNTER — Other Ambulatory Visit: Payer: Self-pay | Admitting: *Deleted

## 2016-01-19 ENCOUNTER — Telehealth: Payer: Self-pay | Admitting: Internal Medicine

## 2016-01-19 ENCOUNTER — Telehealth: Payer: Self-pay | Admitting: *Deleted

## 2016-01-19 DIAGNOSIS — G894 Chronic pain syndrome: Secondary | ICD-10-CM | POA: Diagnosis not present

## 2016-01-19 DIAGNOSIS — Z79891 Long term (current) use of opiate analgesic: Secondary | ICD-10-CM | POA: Diagnosis not present

## 2016-01-19 DIAGNOSIS — M4726 Other spondylosis with radiculopathy, lumbar region: Secondary | ICD-10-CM | POA: Diagnosis not present

## 2016-01-19 DIAGNOSIS — M47812 Spondylosis without myelopathy or radiculopathy, cervical region: Secondary | ICD-10-CM | POA: Diagnosis not present

## 2016-01-19 NOTE — Telephone Encounter (Signed)
Called spoke with Tedra Coupe, pt has a CT 01/20/16 States that Dr Earlie Server has order a CT Chest and Abd/Pelvic Dr Chase Caller ordered a HRCT to be done as well.  Both scans are scheduled for the same day.  Tedra Coupe would like to know if she needs to call and have Dr Earlie Server cancel his scan for the time being if the HRCT is more urgent or which would be better to obtain.   Please advise MR. Thanks.

## 2016-01-19 NOTE — Telephone Encounter (Signed)
Fax from [facility] in radiology regarding pt lab appt. POF to scheduling to move pt lab appt from 3/13 to 3/10 prior to CT scan. Pt to be notified by scheduling of this change.

## 2016-01-19 NOTE — Telephone Encounter (Signed)
We have been on this issue many times and even patient is cnfused. Both scans are done on same day , same setting, They first do the HRCT and then give contrast and do Dr Julien Nordmann CT. Both are 2 different protocols to get 2 different info but done at same setting. [place] CT does this for me all the time. Not sure why there is repeated confusion

## 2016-01-19 NOTE — Telephone Encounter (Signed)
cld & spoke to pt and gave tp time & date of lab prior to CT adv @ 1:15 3/10

## 2016-01-19 NOTE — Telephone Encounter (Signed)
Spoke with [facility] @ CT and discussed MR's instructions/request. She voiced understanding. Also advised pt of dual CT process and he also voiced understanding. No further questions or concerns.

## 2016-01-20 ENCOUNTER — Other Ambulatory Visit: Payer: Medicare Other

## 2016-01-20 ENCOUNTER — Other Ambulatory Visit (HOSPITAL_BASED_OUTPATIENT_CLINIC_OR_DEPARTMENT_OTHER): Payer: Medicare Other

## 2016-01-20 ENCOUNTER — Ambulatory Visit (HOSPITAL_COMMUNITY)
Admission: RE | Admit: 2016-01-20 | Discharge: 2016-01-20 | Disposition: A | Discharge status: Home / Self Care | Payer: Medicare Other | Source: Ambulatory Visit | Attending: Internal Medicine | Admitting: Internal Medicine

## 2016-01-20 DIAGNOSIS — N289 Disorder of kidney and ureter, unspecified: Secondary | ICD-10-CM | POA: Diagnosis not present

## 2016-01-20 DIAGNOSIS — T451X5A Adverse effect of antineoplastic and immunosuppressive drugs, initial encounter: Secondary | ICD-10-CM | POA: Diagnosis not present

## 2016-01-20 DIAGNOSIS — R590 Localized enlarged lymph nodes: Secondary | ICD-10-CM | POA: Insufficient documentation

## 2016-01-20 DIAGNOSIS — C3491 Malignant neoplasm of unspecified part of right bronchus or lung: Secondary | ICD-10-CM | POA: Insufficient documentation

## 2016-01-20 DIAGNOSIS — J479 Bronchiectasis, uncomplicated: Secondary | ICD-10-CM | POA: Diagnosis not present

## 2016-01-20 DIAGNOSIS — R918 Other nonspecific abnormal finding of lung field: Secondary | ICD-10-CM | POA: Insufficient documentation

## 2016-01-20 DIAGNOSIS — M47816 Spondylosis without myelopathy or radiculopathy, lumbar region: Secondary | ICD-10-CM | POA: Diagnosis not present

## 2016-01-20 DIAGNOSIS — D61810 Antineoplastic chemotherapy induced pancytopenia: Secondary | ICD-10-CM | POA: Diagnosis not present

## 2016-01-20 DIAGNOSIS — R0989 Other specified symptoms and signs involving the circulatory and respiratory systems: Secondary | ICD-10-CM

## 2016-01-20 DIAGNOSIS — M5136 Other intervertebral disc degeneration, lumbar region: Secondary | ICD-10-CM | POA: Insufficient documentation

## 2016-01-20 DIAGNOSIS — N400 Enlarged prostate without lower urinary tract symptoms: Secondary | ICD-10-CM | POA: Diagnosis not present

## 2016-01-20 DIAGNOSIS — I700 Atherosclerosis of aorta: Secondary | ICD-10-CM | POA: Diagnosis not present

## 2016-01-20 DIAGNOSIS — C3490 Malignant neoplasm of unspecified part of unspecified bronchus or lung: Secondary | ICD-10-CM | POA: Diagnosis not present

## 2016-01-20 DIAGNOSIS — I7102 Dissection of abdominal aorta: Secondary | ICD-10-CM | POA: Insufficient documentation

## 2016-01-20 DIAGNOSIS — R9389 Abnormal findings on diagnostic imaging of other specified body structures: Secondary | ICD-10-CM

## 2016-01-20 DIAGNOSIS — Z5111 Encounter for antineoplastic chemotherapy: Secondary | ICD-10-CM

## 2016-01-20 LAB — COMPREHENSIVE METABOLIC PANEL
ALT: 16 U/L (ref 0–55)
ANION GAP: 10 meq/L (ref 3–11)
AST: 26 U/L (ref 5–34)
Albumin: 3.6 g/dL (ref 3.5–5.0)
Alkaline Phosphatase: 63 U/L (ref 40–150)
BILIRUBIN TOTAL: 0.39 mg/dL (ref 0.20–1.20)
BUN: 7.6 mg/dL (ref 7.0–26.0)
CALCIUM: 9.4 mg/dL (ref 8.4–10.4)
CO2: 23 mEq/L (ref 22–29)
CREATININE: 1 mg/dL (ref 0.7–1.3)
Chloride: 103 mEq/L (ref 98–109)
EGFR: 74 mL/min/{1.73_m2} — ABNORMAL LOW (ref >90)
Glucose: 103 mg/dl (ref 70–140)
Potassium: 4.5 mEq/L (ref 3.5–5.1)
Sodium: 135 mEq/L — ABNORMAL LOW (ref 136–145)
TOTAL PROTEIN: 7.4 g/dL (ref 6.4–8.3)

## 2016-01-20 LAB — CBC WITH DIFFERENTIAL/PLATELET
BASO%: 0.7 % (ref 0.0–2.0)
Basophils Absolute: 0 10*3/uL (ref 0.0–0.1)
EOS ABS: 0.1 10*3/uL (ref 0.0–0.5)
EOS%: 4.2 % (ref 0.0–7.0)
HEMATOCRIT: 36.4 % — AB (ref 38.4–49.9)
HGB: 12 g/dL — ABNORMAL LOW (ref 13.0–17.1)
LYMPH#: 1.1 10*3/uL (ref 0.9–3.3)
LYMPH%: 37.9 % (ref 14.0–49.0)
MCH: 33.6 pg — ABNORMAL HIGH (ref 27.2–33.4)
MCHC: 32.8 g/dL (ref 32.0–36.0)
MCV: 102.4 fL — ABNORMAL HIGH (ref 79.3–98.0)
MONO#: 0.5 10*3/uL (ref 0.1–0.9)
MONO%: 18.3 % — ABNORMAL HIGH (ref 0.0–14.0)
NEUT%: 38.9 % — AB (ref 39.0–75.0)
NEUTROS ABS: 1.1 10*3/uL — AB (ref 1.5–6.5)
PLATELETS: 69 10*3/uL — AB (ref 140–400)
RBC: 3.56 10*6/uL — ABNORMAL LOW (ref 4.20–5.82)
RDW: 20.7 % — ABNORMAL HIGH (ref 11.0–14.6)
WBC: 2.8 10*3/uL — ABNORMAL LOW (ref 4.0–10.3)

## 2016-01-20 MED ORDER — IOHEXOL 300 MG/ML  SOLN
100.0000 mL | Freq: Once | INTRAMUSCULAR | Status: AC | PRN
  Administered 2016-01-20: 100 mL via INTRAVENOUS

## 2016-01-23 ENCOUNTER — Other Ambulatory Visit: Payer: Medicare Other

## 2016-01-23 ENCOUNTER — Encounter: Payer: Self-pay | Admitting: Internal Medicine

## 2016-01-23 ENCOUNTER — Ambulatory Visit (HOSPITAL_BASED_OUTPATIENT_CLINIC_OR_DEPARTMENT_OTHER): Payer: Medicare Other | Admitting: Internal Medicine

## 2016-01-23 VITALS — BP 130/53 | HR 67 | Temp 97.6°F | Resp 18 | Ht 66.5 in | Wt 184.9 lb

## 2016-01-23 DIAGNOSIS — C3491 Malignant neoplasm of unspecified part of right bronchus or lung: Secondary | ICD-10-CM | POA: Diagnosis not present

## 2016-01-23 DIAGNOSIS — D61818 Other pancytopenia: Secondary | ICD-10-CM | POA: Diagnosis not present

## 2016-01-23 DIAGNOSIS — C778 Secondary and unspecified malignant neoplasm of lymph nodes of multiple regions: Secondary | ICD-10-CM | POA: Diagnosis not present

## 2016-01-23 DIAGNOSIS — D696 Thrombocytopenia, unspecified: Secondary | ICD-10-CM | POA: Diagnosis not present

## 2016-01-23 DIAGNOSIS — Z5111 Encounter for antineoplastic chemotherapy: Secondary | ICD-10-CM

## 2016-01-23 DIAGNOSIS — R530 Neoplastic (malignant) related fatigue: Secondary | ICD-10-CM | POA: Diagnosis not present

## 2016-01-23 NOTE — Progress Notes (Signed)
[place] [phone]   [phone]  OFFICE PROGRESS NOTE  [name], [address] [place] [address]  DIAGNOSIS: Extensive stage (TX, N2, M1b) small cell lung cancer in September 2016 presenting with right hilar and mediastinal lymphadenopathy as well as highly suspicious metastatic disease in the axilla bilaterally.   PRIOR THERAPY: Systemic chemotherapy with carboplatin for AUC of 5 on day 1 and etoposide 120 MG/M2 on days one 2, 3 with Neulasta support on day 4. Status post 4 cycles. Last dose was given 11/02/2015 discontinued secondary to intolerance.  CURRENT THERAPY: Observation.  INTERVAL HISTORY: Andrew Shaw 70 y.o. male returns to the clinic today for follow-up visit accompanied by his daughter. He is feeling fine today except for mild fatigue and occasional hemorrhoidal bleed. He completed 4 cycles of systemic chemotherapy with carboplatin and etoposide discontinued secondary to intolerance with significant pancytopenia.. He has been on observation for the last 2 months and feeling much better except for mild fatigue. The patient denied having any significant bleeding issues. He denied having any chest pain, shortness of breath, cough or hemoptysis. He denied having any significant nausea or vomiting. No significant weight loss or night sweats. He has no fever or chills. He has repeat CT scan of the chest, abdomen and pelvis performed recently and he is here for evaluation and discussion of his scan results.  MEDICAL HISTORY: Past Medical History  Diagnosis Date  . Coronary artery disease     2D ECHO, 10/31/2010 - EF >55%, normal; NUCLEAR STRESS TEST, 10/23/2010 - perfusion defect in inferior myocardial region, post-stress EF 62%, EKG negative for ischemia  . Hypertension   . Hyperlipemia   . Chronic back pain   . DVT (deep venous thrombosis) ([facility])     history 2004 after knee surg  . BPH (benign prostatic  hyperplasia)   . Arthritis   . Neuromuscular disorder ([facility])   . Carpal tunnel syndrome   . 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 GERD (gastroesophageal reflux disease)   . Myocardial infarction ([facility]) 2005    from steroids  . S/P angioplasty with stent, BMS to LCX  12/23/12 12/23/2012  . Back pain 12/23/2012  . Shortness of breath     with exertion  . COPD (chronic obstructive pulmonary disease) ([facility])   . Carpal tunnel syndrome, right   . Skin rash 08/22/2015    ALLERGIES:  is allergic to cortisone; latex; lisinopril; other; antihistamines, chlorpheniramine-type; and flexeril.  MEDICATIONS:  Current Outpatient Prescriptions  Medication Sig Dispense Refill  . aspirin EC 81 MG tablet Take 81 mg by mouth daily.    . fenofibrate (TRICOR) 48 MG tablet Take 48 mg by mouth daily.    Marland Kitchen HYDROcodone-acetaminophen (NORCO) 10-325 MG per tablet Take 1 tablet by mouth every 4 (four) hours as needed (breakthrough pain).     . hydrocortisone (CORTEF) 20 MG tablet     . irbesartan (AVAPRO) 150 MG tablet Take 1 tablet (150 mg total) by mouth daily. PLEASE SCHEDULE APPOINTMENT. 90 tablet 0  . lidocaine-prilocaine (EMLA) cream Apply one application to port a cath 1-2 hours prior to access. 30 g 2  . magic mouthwash w/lidocaine SOLN Take 15 mLs by mouth 4 (four) times daily as needed for mouth pain.  0  . metoprolol (LOPRESSOR) 50 MG tablet TAKE 1 TABLET BY MOUTH TWICE DAILY (Patient taking differently: TAKE 50 MG BY MOUTH TWICE  DAILY) 60 tablet 9  . mometasone (ELOCON) 0.1 % cream Apply 1 application topically daily as needed (for rash).   3  . nitroGLYCERIN (NITROSTAT) 0.4 MG SL tablet Place 1 tablet (0.4 mg total) under the tongue every 5 (five) minutes x 3 doses as needed for chest pain. 25 tablet 2  . OXYCONTIN 20 MG T12A 12 hr tablet Take 20 mg by mouth every 8 (eight) hours.  0  . prochlorperazine (COMPAZINE) 10 MG tablet Take 1 tablet (10 mg total) by mouth every 6 (six) hours as needed for  nausea or vomiting. 30 tablet 1  . rosuvastatin (CRESTOR) 20 MG tablet Take 20 mg by mouth daily.     No current facility-administered medications for this visit.    SURGICAL HISTORY:  Past Surgical History  Procedure Laterality Date  . Knee arthroscopy  04,06    left  . Joint replacement Left 2008    lt total knee  . Manipulation knee joint Left 2009    closed lt knee   . Hernia repair  2008    umb   . Cervical fusion  1999  . Back surgery  2004    lumb fusion  . Epidural block injection      multiple lumbar  . Carpal tunnel release  04/15/2012    Procedure: CARPAL TUNNEL RELEASE;  Surgeon: Wynonia Sours, MD;  Location: [place];  Service: Orthopedics;  Laterality: Left;  . Trigger finger release  04/15/2012    Procedure: RELEASE TRIGGER FINGER/A-1 PULLEY;  Surgeon: Wynonia Sours, MD;  Location: [place];  Service: Orthopedics;  Laterality: Left;  left thumb and little finger  . Cardiac catheterization  12/23/2012    Mid nondominant AV groove circumflex stented with a 2.5x43m Mini Vision stent resulting in a reduction of 90% stenosis to 0% residual  . Cardiac catheterization  02/07/2004    Noncritical CAD, continue medical therapy  . Cardiac catheterization  02/03/1999    Recommended medical therapy  . Left heart catheterization with coronary angiogram N/A 12/23/2012    Procedure: LEFT HEART CATHETERIZATION WITH CORONARY ANGIOGRAM;  Surgeon: JLorretta Harp MD;  [facility];  Service: Cardiovascular;  Laterality: N/A;  . Percutaneous coronary stent intervention (pci-s)  12/23/2012    Procedure: PERCUTANEOUS CORONARY STENT INTERVENTION (PCI-S);  Surgeon: JLorretta Harp MD;  [facility];  Service: Cardiovascular;;  . Left heart catheterization with coronary angiogram N/A 01/11/2015    Procedure: LEFT HEART CATHETERIZATION WITH CORONARY ANGIOGRAM;  Surgeon: Peter M JMartinique MD;  [facility];  Service: Cardiovascular;   Laterality: N/A;  . Colonoscopy w/ polypectomy    . Video bronchoscopy with endobronchial ultrasound N/A 07/28/2015    Procedure: VIDEO BRONCHOSCOPY WITH ENDOBRONCHIAL ULTRASOUND;  Surgeon: SMelrose Nakayama MD;  [place]  Service: Thoracic;  Laterality: N/A;    REVIEW OF SYSTEMS:  Constitutional: positive for fatigue Eyes: negative Ears, nose, mouth, throat, and face: negative Respiratory: negative Cardiovascular: negative Gastrointestinal: negative Genitourinary:negative Integument/breast: negative Hematologic/lymphatic: negative Musculoskeletal:negative Neurological: negative Behavioral/Psych: negative Endocrine: negative Allergic/Immunologic: negative   PHYSICAL EXAMINATION: General appearance: alert, cooperative, fatigued and no distress Head: Normocephalic, without obvious abnormality, atraumatic Neck: no adenopathy, no JVD, supple, symmetrical, trachea midline and thyroid not enlarged, symmetric, no tenderness/mass/nodules Lymph nodes: Cervical, supraclavicular, and axillary nodes normal. Resp: clear to auscultation bilaterally Back: symmetric, no curvature. ROM normal. No CVA tenderness. Cardio: regular rate and rhythm, S1, S2 normal, no murmur, click,  rub or gallop GI: soft, non-tender; bowel sounds normal; no masses,  no organomegaly Extremities: extremities normal, atraumatic, no cyanosis or edema Neurologic: Alert and oriented X 3, normal strength and tone. Normal symmetric reflexes. Normal coordination and gait    ECOG PERFORMANCE STATUS: 1 - Symptomatic but completely ambulatory  There were no vitals taken for this visit.  LABORATORY DATA: Lab Results  Component Value Date   WBC 2.8* 01/20/2016   HGB 12.0* 01/20/2016   HCT 36.4* 01/20/2016   MCV 102.4* 01/20/2016   PLT 69* 01/20/2016      Chemistry      Component Value Date/Time   NA 135* 01/20/2016 1321   NA 134* 11/16/2015 0457   K 4.5 01/20/2016 1321   K 3.5 11/16/2015 0457   CL 102  11/16/2015 0457   CO2 23 01/20/2016 1321   CO2 26 11/16/2015 0457   BUN 7.6 01/20/2016 1321   BUN 10 11/16/2015 0457   CREATININE 1.0 01/20/2016 1321   CREATININE 0.68 11/16/2015 0457      Component Value Date/Time   CALCIUM 9.4 01/20/2016 1321   CALCIUM 8.3* 11/16/2015 0457   ALKPHOS 63 01/20/2016 1321   ALKPHOS 71 11/16/2015 0457   AST 26 01/20/2016 1321   AST 24 11/16/2015 0457   ALT 16 01/20/2016 1321   ALT 50 11/16/2015 0457   BILITOT 0.39 01/20/2016 1321   BILITOT 0.3 11/16/2015 0457       RADIOGRAPHIC STUDIES: Ct Chest W Contrast  01/20/2016  CLINICAL DATA:  Lung cancer. Chemotherapy stopped two months ago due to leukopenia. EXAM: CT CHEST, ABDOMEN, AND PELVIS WITH CONTRAST TECHNIQUE: Multidetector CT imaging of the chest, abdomen and pelvis was performed following the standard protocol during bolus administration of intravenous contrast. CONTRAST:  132m OMNIPAQUE IOHEXOL 300 MG/ML  SOLN COMPARISON:  Multiple exams, including 10/20/2015 FINDINGS: CT CHEST FINDINGS Mediastinum/Nodes: 1.6 by 1.0 cm exophytic nodule posteriorly from the left thyroid lobe, similar to prior. This was not previously hypermetabolic on PET-CT. Subcarinal node 2.1 cm in short axis, image 29 series 2, formerly 0.7 cm. Right hilar node 8 mm in short axis, formerly the same. Right infrahilar node 0.9 cm in short axis on image 34 series 2, formerly 0.6 cm. Coronary, aortic arch, and branch vessel atherosclerotic vascular disease. Left ventricular apical thinning. Lungs/Pleura: Cylindrical bronchiectasis, right lower lobe, with slight airway thickening and mild reticulonodular opacity and scarring or atelectasis in the right lower lobe, images 45 through 53 series 4. This is increase compared to the prior exam. There is some airway plugging in this vicinity on image 40 through 41 series 4. Left lung unremarkable. As requested, high-resolution images were obtained in inspiratory and expiratory settings. The these  also show the bibasilar cylindrical bronchiectasis and airway thickening, without significant air trapping. Musculoskeletal: Unremarkable CT ABDOMEN PELVIS FINDINGS Hepatobiliary: Mildly contracted gallbladder. Punctate calcification in the right hepatic lobe unchanged, likely postinflammatory. Pancreas: Unremarkable Spleen: Unremarkable Adrenals/Urinary Tract: Is 5 mm hypodense lesion in the left kidney upper pole is technically too small to characterize but stable, and was present albeit smaller in 2009. Adrenal glands normal. Stomach/Bowel: Unremarkable Vascular/Lymphatic: Aortoiliac atherosclerotic vascular disease. 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 Portacaval node 1.2 cm in short axis, previously 1.1 cm by my measurement. Porta hepatis node 0.8 cm in short axis, image 61 series 2. Reproductive: Prostate volume estimated at 50 cc. Clustered calcifications in the central zone. Fatty bilateral spermatic cords. Other: No supplemental non-categorized findings.  Musculoskeletal: Lumbar spondylosis and degenerative disc disease with suspected impingement at the L4-5 level. Mild chondral thinning and spurring of both hips. IMPRESSION: 1. There is been significant enlargement in the subcarinal lymph node, 2.1 cm in short axis and previously 0.7 cm, concerning for recurrent malignancy at this site. A right infrahilar node has enlarged from 6 mm to 9 mm in short axis. 2. Airway thickening and cylindrical bronchiectasis in both lower lobes, with reticulonodular opacity along with scarring or atelectasis in the right lower lobe. No air trapping on the high-resolution images. 3. Small hypodense lesion in the left kidney upper pole, technically too small to characterize although statistically likely to be benign. 4. 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 5. Mildly enlarged prostate gland, volume estimated at 50 cc. 6.  Lumbar spondylosis and degenerative disc disease with suspected impingement at the L4-5 level. Electronically Signed   By: Van Clines M.D.   On: 01/20/2016 16:28   Ct Abdomen Pelvis W Contrast  01/20/2016  CLINICAL DATA:  Lung cancer. Chemotherapy stopped two months ago due to leukopenia. EXAM: CT CHEST, ABDOMEN, AND PELVIS WITH CONTRAST TECHNIQUE: Multidetector CT imaging of the chest, abdomen and pelvis was performed following the standard protocol during bolus administration of intravenous contrast. CONTRAST:  161m OMNIPAQUE IOHEXOL 300 MG/ML  SOLN COMPARISON:  Multiple exams, including 10/20/2015 FINDINGS: CT CHEST FINDINGS Mediastinum/Nodes: 1.6 by 1.0 cm exophytic nodule posteriorly from the left thyroid lobe, similar to prior. This was not previously hypermetabolic on PET-CT. Subcarinal node 2.1 cm in short axis, image 29 series 2, formerly 0.7 cm. Right hilar node 8 mm in short axis, formerly the same. Right infrahilar node 0.9 cm in short axis on image 34 series 2, formerly 0.6 cm. Coronary, aortic arch, and branch vessel atherosclerotic vascular disease. Left ventricular apical thinning. Lungs/Pleura: Cylindrical bronchiectasis, right lower lobe, with slight airway thickening and mild reticulonodular opacity and scarring or atelectasis in the right lower lobe, images 45 through 53 series 4. This is increase compared to the prior exam. There is some airway plugging in this vicinity on image 40 through 41 series 4. Left lung unremarkable. As requested, high-resolution images were obtained in inspiratory and expiratory settings. The these also show the bibasilar cylindrical bronchiectasis and airway thickening, without significant air trapping. Musculoskeletal: Unremarkable CT ABDOMEN PELVIS FINDINGS Hepatobiliary: Mildly contracted gallbladder. Punctate calcification in the right hepatic lobe unchanged, likely postinflammatory. Pancreas: Unremarkable Spleen: Unremarkable Adrenals/Urinary Tract:  Is 5 mm hypodense lesion in the left kidney upper pole is technically too small to characterize but stable, and was present albeit smaller in 2009. Adrenal glands normal. Stomach/Bowel: Unremarkable Vascular/Lymphatic: Aortoiliac atherosclerotic vascular disease. 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 Portacaval node 1.2 cm in short axis, previously 1.1 cm by my measurement. Porta hepatis node 0.8 cm in short axis, image 61 series 2. Reproductive: Prostate volume estimated at 50 cc. Clustered calcifications in the central zone. Fatty bilateral spermatic cords. Other: No supplemental non-categorized findings. Musculoskeletal: Lumbar spondylosis and degenerative disc disease with suspected impingement at the L4-5 level. Mild chondral thinning and spurring of both hips. IMPRESSION: 1. There is been significant enlargement in the subcarinal lymph node, 2.1 cm in short axis and previously 0.7 cm, concerning for recurrent malignancy at this site. A right infrahilar node has enlarged from 6 mm to 9 mm in short axis. 2. Airway thickening and cylindrical bronchiectasis in both lower lobes, with reticulonodular opacity along  with scarring or atelectasis in the right lower lobe. No air trapping on the high-resolution images. 3. Small hypodense lesion in the left kidney upper pole, technically too small to characterize although statistically likely to be benign. 4. 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 5. Mildly enlarged prostate gland, volume estimated at 50 cc. 6. Lumbar spondylosis and degenerative disc disease with suspected impingement at the L4-5 level. Electronically Signed   By: Van Clines M.D.   On: 01/20/2016 16:28    ASSESSMENT AND PLAN: This is a very pleasant 70 years old white male recently diagnosed with extensive stage small cell lung cancer currently undergoing systemic chemotherapy with carboplatin  and etoposide status post 4 cycles and tolerating his treatment well except for pancytopenia.  He has been observation for the last 2 months and feeling much better but continues to have thrombocytopenia improved compared to 2 months ago. The recent CT scan of the chest, abdomen and pelvis showed significant enlargement of the subcarinal lymph node as well as right infrahilar lymphadenopathy. I discussed the scan results with the patient and his daughter. I recommended for him to see Dr. Pablo Ledger for consideration of palliative radiotherapy to the enlarging mediastinal and right hilar lymphadenopathy in addition to consideration of prophylactic cranial irradiation. I will see him back for follow-up visit in 3 months for reevaluation after repeating CT scan of the chest, abdomen and pelvis for restaging of his disease. For the persistent thrombocytopenia, this is most likely secondary to his previous treatment with chemotherapy. I will continue to monitor it closely. The patient and his family agreed to the current plan. The patient was advised to call immediately if he has any concerning symptoms in the interval. The patient voices understanding of current disease status and treatment options and is in agreement with the current care plan.  All questions were answered. The patient knows to call the clinic with any problems, questions or concerns. We can certainly see the patient much sooner if necessary.  Disclaimer: This note was dictated with voice recognition software. Similar sounding words can inadvertently be transcribed and may not be corrected upon review.

## 2016-01-24 ENCOUNTER — Telehealth: Payer: Self-pay | Admitting: Internal Medicine

## 2016-01-24 NOTE — Telephone Encounter (Signed)
S.w. Pt and advised on June appt...the patient ok and aware

## 2016-02-03 NOTE — Telephone Encounter (Signed)
Error

## 2016-02-10 NOTE — Progress Notes (Signed)
Mr. Andrew Shaw is here for a FUN visit for Small cell carcinoma of lung, right .   Rcommended  to see Dr. Pablo Ledger for consideration of palliative radiotherapy to the enlarging mediastinal and right hilar lymphadenopathy in addition to consideration of prophylactic cranial irradiation per Dr. Julien Nordmann and completion of his chemotherapy.   Weight changes, if any: weight gain 6 lbs. Wt Readings from Last 3 Encounters:  02/15/16 190 lb (86.183 kg)  01/23/16 184 lb 14.4 oz (83.87 kg)  01/12/16 182 lb 6.4 oz (82.736 kg)   Respiratory complaints, if any: SOB at times,coughing yellow to brown color at night mostly, Hemoptysis, if any:  No Swallowing Problems/Pain/Difficulty swallowing:No Smoking Tobacco/Marijuana/Snuff/ETOH WVP:[id number] and E-cigarettes 1/4 pack/day Skin:Normal pink warm and dry Pain : 5/10 mid chest lymph node not cardiac pain ,taking Hydrocodone for pain Appetite:Too good Energy level:Having fatigue most of the day When is next chemo scheduled?:06-2015 Last dose carboplatin for AUC of 5 on day 1 and etoposide 120 MG/M2 on days one 2, 3 with Neulasta support on day 4. Status post 4 cycles. Last dose was given 11/02/2015 discontinued secondary to intolerance Lab work from of chart:01-20-16 CBCwdiff,CMET BP 134/63 mmHg  Pulse 71  Temp(Src) 98 F (36.7 C) (Oral)  Resp 18  Ht 5' 6.5" (1.689 m)  Wt 190 lb (86.183 kg)  BMI 30.21 kg/m2  SpO2 97%

## 2016-02-15 ENCOUNTER — Ambulatory Visit
Admission: RE | Admit: 2016-02-15 | Discharge: 2016-02-15 | Disposition: A | Discharge status: Home / Self Care | Payer: Medicare Other | Source: Ambulatory Visit | Attending: Radiation Oncology | Admitting: Radiation Oncology

## 2016-02-15 ENCOUNTER — Encounter: Payer: Self-pay | Admitting: Radiation Oncology

## 2016-02-15 VITALS — BP 134/63 | HR 71 | Temp 98.0°F | Resp 18 | Ht 66.5 in | Wt 190.0 lb

## 2016-02-15 DIAGNOSIS — C3491 Malignant neoplasm of unspecified part of right bronchus or lung: Secondary | ICD-10-CM | POA: Insufficient documentation

## 2016-02-15 DIAGNOSIS — Z510 Encounter for antineoplastic radiation therapy: Secondary | ICD-10-CM | POA: Diagnosis not present

## 2016-02-15 DIAGNOSIS — F17290 Nicotine dependence, other tobacco product, uncomplicated: Secondary | ICD-10-CM | POA: Insufficient documentation

## 2016-02-15 DIAGNOSIS — F17210 Nicotine dependence, cigarettes, uncomplicated: Secondary | ICD-10-CM | POA: Insufficient documentation

## 2016-02-15 DIAGNOSIS — C7931 Secondary malignant neoplasm of brain: Secondary | ICD-10-CM | POA: Insufficient documentation

## 2016-02-15 DIAGNOSIS — C3490 Malignant neoplasm of unspecified part of unspecified bronchus or lung: Secondary | ICD-10-CM | POA: Diagnosis present

## 2016-02-15 HISTORY — DX: Malignant neoplasm of unspecified part of unspecified bronchus or lung: C34.90

## 2016-02-15 NOTE — Addendum Note (Signed)
Encounter addended by: Malena Edman, RN on: 02/15/2016  6:17 PM<BR>     Documentation filed: Charges VN

## 2016-02-15 NOTE — Progress Notes (Signed)
   Department of Radiation Oncology  Phone:  [phone] Fax:        [phone]   Name: Andrew Shaw MRN: [id number]  DOB: August 15, 1946  Date: 02/15/2016  Follow up Visit Note  Diagnosis: Small cell carcinoma of lung [facility])   Staging form: Lung, AJCC 7th Edition     Clinical stage from 08/04/2015: Stage IV (TX, N2, M1b) - Signed by Curt Bears, MD on 08/04/2015       Staging comments: Small cell lung cancer  Prior Radiation: No  Interval History: Braedan has completed systemic chemotherapy. His last dose was in December 2017. He was seen by Dr. Earlie Server on 01/23/2016 and was found to have progression in a subcarinal and right hilar lymph node. No other evidence of disease was noted. No suggestion of metastatic disease. MRI of the brain was negative in September. He was therefore referred for consideration of radiation to his chest as well as for prophylactic cranial radiation.   Today, he reports SOB at times and coughing up yellow to brown color sputum at night mostly. He denies hemoptysis and swallowing problems/pain. He reports pain at a 5/10 in the mid chest, and is taking hydrocodone for pain. He reports that his appetite is "too good." He is also having fatigue most of the day. He uses cigarettes and e-cigarettes about 1/4 pack per day.  Physical Exam:  Filed Vitals:   02/15/16 1402  BP: 134/63  Pulse: 71  Temp: 98 F (36.7 C)  TempSrc: Oral  Resp: 18  Height: 5' 6.5" (1.689 m)  Weight: 190 lb (86.183 kg)  SpO2: 97%   This is a pleasant male in no acute distress. He is alert and oriented.   IMPRESSION: Aristotelis is a 70 y.o. male with extensive stage small cell lung cancer.   PLAN:  MRI of the brain will be ordered to ensure he has not developed brain metastases. He will then be scheduled for CT simulation for the chest and brain following this. We discussed possible side effects including but not limited to fatigue, dysphagia, hair loss, memory loss, and damage to  critical normal structures including the brain, eyes, spinal cord, lungs, and heart. He has signed informed consent and we will give him his simulation appointment.    Thea Silversmith, MD  This document serves as a record of services personally performed by Thea Silversmith, MD. It was created on her behalf by Jenell Milliner, a trained medical scribe. The creation of this record is based on the scribe's personal observations and the provider's statements to them. This document has been checked and approved by the attending provider.

## 2016-02-16 ENCOUNTER — Telehealth: Payer: Self-pay | Admitting: *Deleted

## 2016-02-16 DIAGNOSIS — Z79891 Long term (current) use of opiate analgesic: Secondary | ICD-10-CM | POA: Diagnosis not present

## 2016-02-16 DIAGNOSIS — M4726 Other spondylosis with radiculopathy, lumbar region: Secondary | ICD-10-CM | POA: Diagnosis not present

## 2016-02-16 DIAGNOSIS — M47812 Spondylosis without myelopathy or radiculopathy, cervical region: Secondary | ICD-10-CM | POA: Diagnosis not present

## 2016-02-16 DIAGNOSIS — G894 Chronic pain syndrome: Secondary | ICD-10-CM | POA: Diagnosis not present

## 2016-02-16 NOTE — Telephone Encounter (Signed)
CALLED PATIENT TO INFORM OF MRI AND SIM, SPOKE WITH PATIENT AND HE IS AWARE OF THESE APPTS.

## 2016-02-21 ENCOUNTER — Other Ambulatory Visit: Payer: Self-pay | Admitting: Cardiovascular Disease

## 2016-02-21 NOTE — Telephone Encounter (Signed)
REFILL 

## 2016-02-23 ENCOUNTER — Ambulatory Visit (HOSPITAL_COMMUNITY)
Admission: RE | Admit: 2016-02-23 | Discharge: 2016-02-23 | Disposition: A | Discharge status: Home / Self Care | Payer: Medicare Other | Source: Ambulatory Visit | Attending: Radiation Oncology | Admitting: Radiation Oncology

## 2016-02-23 ENCOUNTER — Other Ambulatory Visit: Payer: Self-pay | Admitting: Radiation Oncology

## 2016-02-23 DIAGNOSIS — C3491 Malignant neoplasm of unspecified part of right bronchus or lung: Secondary | ICD-10-CM

## 2016-02-23 DIAGNOSIS — C7931 Secondary malignant neoplasm of brain: Secondary | ICD-10-CM | POA: Diagnosis not present

## 2016-02-23 DIAGNOSIS — G936 Cerebral edema: Secondary | ICD-10-CM | POA: Diagnosis not present

## 2016-02-23 DIAGNOSIS — C3490 Malignant neoplasm of unspecified part of unspecified bronchus or lung: Secondary | ICD-10-CM | POA: Diagnosis not present

## 2016-02-23 MED ORDER — GADOBENATE DIMEGLUMINE 529 MG/ML IV SOLN
20.0000 mL | Freq: Once | INTRAVENOUS | Status: AC | PRN
  Administered 2016-02-23: 18 mL via INTRAVENOUS

## 2016-02-28 ENCOUNTER — Ambulatory Visit
Admission: RE | Admit: 2016-02-28 | Discharge: 2016-02-28 | Disposition: A | Discharge status: Home / Self Care | Payer: Medicare Other | Source: Ambulatory Visit | Attending: Radiation Oncology | Admitting: Radiation Oncology

## 2016-02-28 DIAGNOSIS — C3490 Malignant neoplasm of unspecified part of unspecified bronchus or lung: Secondary | ICD-10-CM

## 2016-02-28 DIAGNOSIS — C3491 Malignant neoplasm of unspecified part of right bronchus or lung: Secondary | ICD-10-CM | POA: Diagnosis not present

## 2016-02-28 DIAGNOSIS — C7931 Secondary malignant neoplasm of brain: Secondary | ICD-10-CM | POA: Diagnosis not present

## 2016-02-28 DIAGNOSIS — Z510 Encounter for antineoplastic radiation therapy: Secondary | ICD-10-CM | POA: Diagnosis not present

## 2016-02-28 DIAGNOSIS — F17290 Nicotine dependence, other tobacco product, uncomplicated: Secondary | ICD-10-CM | POA: Diagnosis not present

## 2016-02-28 DIAGNOSIS — F17210 Nicotine dependence, cigarettes, uncomplicated: Secondary | ICD-10-CM | POA: Diagnosis not present

## 2016-02-28 MED ORDER — LORAZEPAM 1 MG PO TABS: 1.0000 mg | ORAL_TABLET | Freq: Every day | ORAL | Status: AC

## 2016-02-28 NOTE — Progress Notes (Signed)
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   NARRATIVE: The patient was brought to the [facility].  Identity was confirmed.  All relevant records and images related to the planned course of therapy were reviewed.  Then, the patient was positioned in a stable reproducible clinical set-up for radiation therapy.  CT images were obtained.  Skin markings were placed.  The CT images were loaded into the planning software where the target and avoidance structures were contoured.  The radiation prescription was entered and confirmed.   TREATMENT PLANNING NOTE:  Treatment planning then occurred. I have requested 3D simulation with [facility] of the spinal cord, total lungs and gross tumor volume. I have also requested mlcs and an isodose plan.   A total of 2 complex treatment devices will be utilized in the form of unique MLCs for this portion of the simulation. We then turned to simulation of his brain.   [name]   Simulation and Treatment Planning Note   Name: Andrew Shaw    MRN: [id number]  Date: 02/28/2016    DOB: May 24, 1946  Status:  DIAGNOSIS: Brain Metastases  CONSENT VERIFIED:yes   SET UP: Patient is setup supine   IMMOBILIZATION: Following immobilization is used:Aquaplast Mask   NARRATIVE:The patient was brought to the [facility]. Identity was confirmed. All relevant records and images related to the planned course of therapy were  reviewed. Then, the patient was positioned in a stable reproducible clinical set-up for radiation therapy. CT images were obtained. Marks were placed on the mask. The CT images were loaded into the planning software where the target (brain) and avoidance structures (globes) were contoured. The radiation prescription was entered and confirmed.   TREATMENT PLANNING NOTE:  Treatment planning then occurred.  I have requested : MLC's, isodose plan, basic dose calculation  5 complex treatment devices will be used in this portion of the treatement to protect the eyes and also for the mask.   [name] was found to have brain metastases on his recent MRI brain.  I discussed this with him, his daughter and his wife. He requested something to help him with anxiety and to sleep at night. I have written a prescription for ativan.

## 2016-03-02 ENCOUNTER — Telehealth: Payer: Self-pay | Admitting: *Deleted

## 2016-03-02 NOTE — Telephone Encounter (Addendum)
Oncology Nurse Navigator Documentation  Oncology Nurse Navigator Flowsheets 03/02/2016  Navigator Location [facility]  Navigator Encounter Type Telephone  Telephone Outgoing Call  Patient Visit Type Follow-up  Treatment Phase -  Barriers/Navigation Needs Education  Education Smoking cessation  Interventions Education Method  Coordination of Care -  Education Method Verbal  Acuity Level 2  Acuity Level 2 Educational needs  Time Spent with Patient 30   I called and spoke with patient today.  I asked how he was feeling.  He stated is was doing well just tired.  I listened as he explained.  Per the radiation therapy NCI booklet, I went over tips to help deal with fatigue.  I also went over side effects due to chemo.  I asked that he call if he dose not feel better or fatigue gets worse.    I also asked about his smoking cessation.  He is unable to quit smoking.  I gave him some tips.  He asked that I speak with Dr. Julien Nordmann.  I will follow up with Dr. Julien Nordmann.

## 2016-03-06 ENCOUNTER — Encounter: Payer: Self-pay | Admitting: Radiation Oncology

## 2016-03-06 ENCOUNTER — Ambulatory Visit
Admission: RE | Admit: 2016-03-06 | Discharge: 2016-03-06 | Disposition: A | Discharge status: Home / Self Care | Payer: Medicare Other | Source: Ambulatory Visit | Attending: Radiation Oncology | Admitting: Radiation Oncology

## 2016-03-06 VITALS — BP 132/66 | HR 61 | Temp 97.9°F | Resp 18 | Ht 66.5 in

## 2016-03-06 DIAGNOSIS — F17210 Nicotine dependence, cigarettes, uncomplicated: Secondary | ICD-10-CM | POA: Diagnosis not present

## 2016-03-06 DIAGNOSIS — C3490 Malignant neoplasm of unspecified part of unspecified bronchus or lung: Secondary | ICD-10-CM | POA: Insufficient documentation

## 2016-03-06 DIAGNOSIS — C7931 Secondary malignant neoplasm of brain: Secondary | ICD-10-CM | POA: Diagnosis not present

## 2016-03-06 DIAGNOSIS — Z510 Encounter for antineoplastic radiation therapy: Secondary | ICD-10-CM | POA: Diagnosis not present

## 2016-03-06 DIAGNOSIS — C3491 Malignant neoplasm of unspecified part of right bronchus or lung: Secondary | ICD-10-CM

## 2016-03-06 DIAGNOSIS — F17290 Nicotine dependence, other tobacco product, uncomplicated: Secondary | ICD-10-CM | POA: Diagnosis not present

## 2016-03-06 MED ORDER — SONAFINE EX EMUL
1.0000 "application " | Freq: Two times a day (BID) | CUTANEOUS | Status: DC
  Administered 2016-03-06: 1 via TOPICAL
  Filled 2016-03-06: qty 45

## 2016-03-06 NOTE — Progress Notes (Signed)
Mr. Lamping has received 1 fraction to his brain.  Skin to brain normal.  Appetite is normal.   Has fatigue if he does yard work otherwise good.   Denies pain and SOB,sore throat and any swallowing problems today. Sonafine given with education. BP 132/66 mmHg  Pulse 61  Temp(Src) 97.9 F (36.6 C) (Oral)  Resp 18  Ht 5' 6.5" (1.689 m)  SpO2 98%

## 2016-03-06 NOTE — Progress Notes (Addendum)
Mr. Sirmon has received 1 fraction to his brain. Skin to brain normal. Appetite is normal. Has fatigue if he does yard work otherwise good. Denies pain and SOB,headache or dizziness problems today. Sonafine given with education.  BP 132/66 mmHg  Pulse 61  Temp(Src) 97.9 F (36.6 C) (Oral)  Resp 18  Ht 5' 6.5" (1.689 m)  SpO2 98%

## 2016-03-07 ENCOUNTER — Ambulatory Visit
Admission: RE | Admit: 2016-03-07 | Discharge: 2016-03-07 | Disposition: A | Discharge status: Home / Self Care | Payer: Medicare Other | Source: Ambulatory Visit | Attending: Radiation Oncology | Admitting: Radiation Oncology

## 2016-03-07 DIAGNOSIS — C3491 Malignant neoplasm of unspecified part of right bronchus or lung: Secondary | ICD-10-CM | POA: Diagnosis not present

## 2016-03-07 DIAGNOSIS — F17210 Nicotine dependence, cigarettes, uncomplicated: Secondary | ICD-10-CM | POA: Diagnosis not present

## 2016-03-07 DIAGNOSIS — F17290 Nicotine dependence, other tobacco product, uncomplicated: Secondary | ICD-10-CM | POA: Diagnosis not present

## 2016-03-07 DIAGNOSIS — Z510 Encounter for antineoplastic radiation therapy: Secondary | ICD-10-CM | POA: Diagnosis not present

## 2016-03-07 DIAGNOSIS — C7931 Secondary malignant neoplasm of brain: Secondary | ICD-10-CM | POA: Diagnosis not present

## 2016-03-08 ENCOUNTER — Ambulatory Visit
Admission: RE | Admit: 2016-03-08 | Discharge: 2016-03-08 | Disposition: A | Discharge status: Home / Self Care | Payer: Medicare Other | Source: Ambulatory Visit | Attending: Radiation Oncology | Admitting: Radiation Oncology

## 2016-03-08 DIAGNOSIS — C7931 Secondary malignant neoplasm of brain: Secondary | ICD-10-CM | POA: Diagnosis not present

## 2016-03-08 DIAGNOSIS — F17290 Nicotine dependence, other tobacco product, uncomplicated: Secondary | ICD-10-CM | POA: Diagnosis not present

## 2016-03-08 DIAGNOSIS — Z510 Encounter for antineoplastic radiation therapy: Secondary | ICD-10-CM | POA: Diagnosis not present

## 2016-03-08 DIAGNOSIS — C3491 Malignant neoplasm of unspecified part of right bronchus or lung: Secondary | ICD-10-CM | POA: Diagnosis not present

## 2016-03-08 DIAGNOSIS — F17210 Nicotine dependence, cigarettes, uncomplicated: Secondary | ICD-10-CM | POA: Diagnosis not present

## 2016-03-09 ENCOUNTER — Ambulatory Visit
Admission: RE | Admit: 2016-03-09 | Discharge: 2016-03-09 | Disposition: A | Discharge status: Home / Self Care | Payer: Medicare Other | Source: Ambulatory Visit | Attending: Radiation Oncology | Admitting: Radiation Oncology

## 2016-03-09 DIAGNOSIS — C7931 Secondary malignant neoplasm of brain: Secondary | ICD-10-CM | POA: Diagnosis not present

## 2016-03-09 DIAGNOSIS — Z510 Encounter for antineoplastic radiation therapy: Secondary | ICD-10-CM | POA: Diagnosis not present

## 2016-03-09 DIAGNOSIS — C3491 Malignant neoplasm of unspecified part of right bronchus or lung: Secondary | ICD-10-CM | POA: Diagnosis not present

## 2016-03-09 DIAGNOSIS — F17290 Nicotine dependence, other tobacco product, uncomplicated: Secondary | ICD-10-CM | POA: Diagnosis not present

## 2016-03-09 DIAGNOSIS — F17210 Nicotine dependence, cigarettes, uncomplicated: Secondary | ICD-10-CM | POA: Diagnosis not present

## 2016-03-12 ENCOUNTER — Ambulatory Visit
Admission: RE | Admit: 2016-03-12 | Discharge: 2016-03-12 | Disposition: A | Discharge status: Home / Self Care | Payer: Medicare Other | Source: Ambulatory Visit | Attending: Radiation Oncology | Admitting: Radiation Oncology

## 2016-03-12 DIAGNOSIS — F17210 Nicotine dependence, cigarettes, uncomplicated: Secondary | ICD-10-CM | POA: Diagnosis not present

## 2016-03-12 DIAGNOSIS — F17290 Nicotine dependence, other tobacco product, uncomplicated: Secondary | ICD-10-CM | POA: Diagnosis not present

## 2016-03-12 DIAGNOSIS — Z510 Encounter for antineoplastic radiation therapy: Secondary | ICD-10-CM | POA: Diagnosis not present

## 2016-03-12 DIAGNOSIS — C3491 Malignant neoplasm of unspecified part of right bronchus or lung: Secondary | ICD-10-CM | POA: Diagnosis not present

## 2016-03-12 DIAGNOSIS — C7931 Secondary malignant neoplasm of brain: Secondary | ICD-10-CM | POA: Diagnosis not present

## 2016-03-13 ENCOUNTER — Ambulatory Visit
Admission: RE | Admit: 2016-03-13 | Discharge: 2016-03-13 | Disposition: A | Discharge status: Home / Self Care | Payer: Medicare Other | Source: Ambulatory Visit | Attending: Radiation Oncology | Admitting: Radiation Oncology

## 2016-03-13 ENCOUNTER — Encounter: Payer: Self-pay | Admitting: Radiation Oncology

## 2016-03-13 VITALS — BP 120/69 | HR 77 | Temp 97.8°F | Resp 18 | Ht 66.5 in | Wt 189.7 lb

## 2016-03-13 DIAGNOSIS — F17210 Nicotine dependence, cigarettes, uncomplicated: Secondary | ICD-10-CM | POA: Diagnosis not present

## 2016-03-13 DIAGNOSIS — C7931 Secondary malignant neoplasm of brain: Secondary | ICD-10-CM | POA: Diagnosis not present

## 2016-03-13 DIAGNOSIS — C3491 Malignant neoplasm of unspecified part of right bronchus or lung: Secondary | ICD-10-CM | POA: Diagnosis not present

## 2016-03-13 DIAGNOSIS — Z510 Encounter for antineoplastic radiation therapy: Secondary | ICD-10-CM | POA: Diagnosis not present

## 2016-03-13 DIAGNOSIS — F17290 Nicotine dependence, other tobacco product, uncomplicated: Secondary | ICD-10-CM | POA: Diagnosis not present

## 2016-03-13 DIAGNOSIS — C3490 Malignant neoplasm of unspecified part of unspecified bronchus or lung: Secondary | ICD-10-CM

## 2016-03-13 NOTE — Progress Notes (Signed)
Weekly Management Note Current Dose: 15 Gy  Projected Dose: 30 Gy   Narrative:  The patient presents for routine under treatment assessment.  CBCT/MVCT images/Port film x-rays were reviewed.  The chart was checked. Andrew Shaw has received 6 fractions to his chest and brain. Skin to chest,ears no change erythema forehead head using Sonafine having itching at times at night. Appetite is too good. Having fatigue during the day and evening. Denies pain. No headaches,visual problems,swallowing problems,SOB nausea/vomiting or sore throat. Right hand index finger numb. No memory problems or problems with his speech. Coughing some at night nonproductive. He would like to go to the beach next week with his daughters.  He is not on decadron. He denies headaches. He is not taking his Ativan which he says interferes with his hydrocodone.  He is not taking it and doesn't feel anxious now.   Physical Findings:  Alert with no headaches. Redness over his forehead and his temples.    Vitals:  Filed Vitals:   03/13/16 1530  BP: 120/69  Pulse: 77  Temp: 97.8 F (36.6 C)  Resp: 18   Weight:  Wt Readings from Last 3 Encounters:  03/13/16 189 lb 11.2 oz (86.047 kg)  02/15/16 190 lb (86.183 kg)  01/23/16 184 lb 14.4 oz (83.87 kg)   Lab Results  Component Value Date   WBC 2.8* 01/20/2016   HGB 12.0* 01/20/2016   HCT 36.4* 01/20/2016   MCV 102.4* 01/20/2016   PLT 69* 01/20/2016   Lab Results  Component Value Date   CREATININE 1.0 01/20/2016   BUN 7.6 01/20/2016   NA 135* 01/20/2016   K 4.5 01/20/2016   CL 102 11/16/2015   CO2 23 01/20/2016     Impression:  The patient is tolerating radiation.  Plan:  Continue treatment as planned. Advised the patient that it would be okay to travel to the beach next week with his daughters. We discussed sun protection. He is not on decadron and had a reaction to a "hip injection" of steroids many years ago.  Since he is asymptomatic, I am not going to put him  on decadron for now.   ------------------------------------------------  Thea Silversmith, MD   This document serves as a record of services personally performed by Thea Silversmith, MD. It was created on her behalf by Arlyce Harman, a trained medical scribe. The creation of this record is based on the scribe's personal observations and the provider's statements to them. This document has been checked and approved by the attending provider.

## 2016-03-13 NOTE — Progress Notes (Signed)
Mr. Dygert has received 6 fractions to his chest and brain.  Skin to chest,ears no change erythema forehead head  using Sonafine having itching at times at night.  Appetite is too good.  Having fatigue during the day and evening.  Denies pain.  No headaches,visual problems,swallowing problems,SOB nausea/vomiting or sore throat.    Right hand index finger numb.  No memory problems or problems with his speech.  Coughing some at night nonproductive. BP 120/69 mmHg  Pulse 77  Temp(Src) 97.8 F (36.6 C) (Oral)  Resp 18  Ht 5' 6.5" (1.689 m)  Wt 189 lb 11.2 oz (86.047 kg)  BMI 30.16 kg/m2  SpO2 97%  .

## 2016-03-14 ENCOUNTER — Ambulatory Visit
Admission: RE | Admit: 2016-03-14 | Discharge: 2016-03-14 | Disposition: A | Discharge status: Home / Self Care | Payer: Medicare Other | Source: Ambulatory Visit | Attending: Radiation Oncology | Admitting: Radiation Oncology

## 2016-03-14 DIAGNOSIS — C7931 Secondary malignant neoplasm of brain: Secondary | ICD-10-CM | POA: Diagnosis not present

## 2016-03-14 DIAGNOSIS — F17290 Nicotine dependence, other tobacco product, uncomplicated: Secondary | ICD-10-CM | POA: Diagnosis not present

## 2016-03-14 DIAGNOSIS — C3491 Malignant neoplasm of unspecified part of right bronchus or lung: Secondary | ICD-10-CM | POA: Diagnosis not present

## 2016-03-14 DIAGNOSIS — F17210 Nicotine dependence, cigarettes, uncomplicated: Secondary | ICD-10-CM | POA: Diagnosis not present

## 2016-03-14 DIAGNOSIS — Z510 Encounter for antineoplastic radiation therapy: Secondary | ICD-10-CM | POA: Diagnosis not present

## 2016-03-15 ENCOUNTER — Ambulatory Visit
Admission: RE | Admit: 2016-03-15 | Discharge: 2016-03-15 | Disposition: A | Discharge status: Home / Self Care | Payer: Medicare Other | Source: Ambulatory Visit | Attending: Radiation Oncology | Admitting: Radiation Oncology

## 2016-03-15 DIAGNOSIS — Z79891 Long term (current) use of opiate analgesic: Secondary | ICD-10-CM | POA: Diagnosis not present

## 2016-03-15 DIAGNOSIS — G894 Chronic pain syndrome: Secondary | ICD-10-CM | POA: Diagnosis not present

## 2016-03-15 DIAGNOSIS — C7931 Secondary malignant neoplasm of brain: Secondary | ICD-10-CM | POA: Diagnosis not present

## 2016-03-15 DIAGNOSIS — C3491 Malignant neoplasm of unspecified part of right bronchus or lung: Secondary | ICD-10-CM | POA: Diagnosis not present

## 2016-03-15 DIAGNOSIS — M47812 Spondylosis without myelopathy or radiculopathy, cervical region: Secondary | ICD-10-CM | POA: Diagnosis not present

## 2016-03-15 DIAGNOSIS — F17290 Nicotine dependence, other tobacco product, uncomplicated: Secondary | ICD-10-CM | POA: Diagnosis not present

## 2016-03-15 DIAGNOSIS — M4726 Other spondylosis with radiculopathy, lumbar region: Secondary | ICD-10-CM | POA: Diagnosis not present

## 2016-03-15 DIAGNOSIS — F17210 Nicotine dependence, cigarettes, uncomplicated: Secondary | ICD-10-CM | POA: Diagnosis not present

## 2016-03-15 DIAGNOSIS — Z510 Encounter for antineoplastic radiation therapy: Secondary | ICD-10-CM | POA: Diagnosis not present

## 2016-03-16 ENCOUNTER — Ambulatory Visit
Admission: RE | Admit: 2016-03-16 | Discharge: 2016-03-16 | Disposition: A | Discharge status: Home / Self Care | Payer: Medicare Other | Source: Ambulatory Visit | Attending: Radiation Oncology | Admitting: Radiation Oncology

## 2016-03-16 DIAGNOSIS — C3491 Malignant neoplasm of unspecified part of right bronchus or lung: Secondary | ICD-10-CM | POA: Diagnosis not present

## 2016-03-16 DIAGNOSIS — C7931 Secondary malignant neoplasm of brain: Secondary | ICD-10-CM | POA: Diagnosis not present

## 2016-03-16 DIAGNOSIS — Z510 Encounter for antineoplastic radiation therapy: Secondary | ICD-10-CM | POA: Diagnosis not present

## 2016-03-16 DIAGNOSIS — F17210 Nicotine dependence, cigarettes, uncomplicated: Secondary | ICD-10-CM | POA: Diagnosis not present

## 2016-03-16 DIAGNOSIS — F17290 Nicotine dependence, other tobacco product, uncomplicated: Secondary | ICD-10-CM | POA: Diagnosis not present

## 2016-03-19 ENCOUNTER — Ambulatory Visit
Admission: RE | Admit: 2016-03-19 | Discharge: 2016-03-19 | Disposition: A | Discharge status: Home / Self Care | Payer: Medicare Other | Source: Ambulatory Visit | Attending: Radiation Oncology | Admitting: Radiation Oncology

## 2016-03-19 DIAGNOSIS — F17290 Nicotine dependence, other tobacco product, uncomplicated: Secondary | ICD-10-CM | POA: Diagnosis not present

## 2016-03-19 DIAGNOSIS — Z510 Encounter for antineoplastic radiation therapy: Secondary | ICD-10-CM | POA: Diagnosis not present

## 2016-03-19 DIAGNOSIS — C7931 Secondary malignant neoplasm of brain: Secondary | ICD-10-CM | POA: Diagnosis not present

## 2016-03-19 DIAGNOSIS — C3491 Malignant neoplasm of unspecified part of right bronchus or lung: Secondary | ICD-10-CM | POA: Diagnosis not present

## 2016-03-19 DIAGNOSIS — F17210 Nicotine dependence, cigarettes, uncomplicated: Secondary | ICD-10-CM | POA: Diagnosis not present

## 2016-03-20 ENCOUNTER — Encounter: Payer: Self-pay | Admitting: Radiation Oncology

## 2016-03-20 ENCOUNTER — Telehealth: Payer: Self-pay | Admitting: Medical Oncology

## 2016-03-20 ENCOUNTER — Ambulatory Visit
Admission: RE | Admit: 2016-03-20 | Discharge: 2016-03-20 | Disposition: A | Discharge status: Home / Self Care | Payer: Medicare Other | Source: Ambulatory Visit | Attending: Radiation Oncology | Admitting: Radiation Oncology

## 2016-03-20 ENCOUNTER — Other Ambulatory Visit: Payer: Self-pay | Admitting: Medical Oncology

## 2016-03-20 ENCOUNTER — Encounter: Payer: Self-pay | Admitting: *Deleted

## 2016-03-20 VITALS — BP 117/65 | HR 82 | Temp 97.8°F | Resp 18 | Ht 66.5 in | Wt 189.9 lb

## 2016-03-20 DIAGNOSIS — C3490 Malignant neoplasm of unspecified part of unspecified bronchus or lung: Secondary | ICD-10-CM

## 2016-03-20 DIAGNOSIS — C3491 Malignant neoplasm of unspecified part of right bronchus or lung: Secondary | ICD-10-CM | POA: Diagnosis not present

## 2016-03-20 DIAGNOSIS — C7931 Secondary malignant neoplasm of brain: Secondary | ICD-10-CM | POA: Diagnosis not present

## 2016-03-20 DIAGNOSIS — F17290 Nicotine dependence, other tobacco product, uncomplicated: Secondary | ICD-10-CM | POA: Diagnosis not present

## 2016-03-20 DIAGNOSIS — F17210 Nicotine dependence, cigarettes, uncomplicated: Secondary | ICD-10-CM | POA: Diagnosis not present

## 2016-03-20 DIAGNOSIS — Z510 Encounter for antineoplastic radiation therapy: Secondary | ICD-10-CM | POA: Diagnosis not present

## 2016-03-20 NOTE — Telephone Encounter (Signed)
I left message telling Andrew Shaw Dr Julien Nordmann wants him to have CT scans on June 11 and he can call central scheduling now to schedule them ( number given) or wait for the scheduler to call him later in the month.

## 2016-03-20 NOTE — Progress Notes (Signed)
Spoke with [facility] Nurse for Dr. Earlie Server Andrew Shaw has an order for a CT of the chest,abdomen and pelvis for 04-22-16 that they still need to schedule. Lab appointment for 04-16-16 and 04-23-16 appointment with Dr. Earlie Server.

## 2016-03-20 NOTE — Progress Notes (Signed)
Weekly Management Note Current Dose: 27.5 Gy  Projected Dose: 30 Gy   Narrative:  The patient presents for routine under treatment assessment.  CBCT/MVCT images/Port film x-rays were reviewed.  The chart was checked.  Andrew Shaw has received 11 fractions to chest and brain. Skin to forehead with erythema and ears with some peeling and chest slight redness using Sonafine. Occasional headache, blurred vision after treatment today without his glasses was better with glasses on. Denies memory changes, nausea or vomiting. Had pain in his esophagus this weekend. Better today without intervention. Coughing at time nonproductive. Denies any cognitive changes. EOT education done.  Physical Findings:  Alert with no headaches. Redness over his forehead and his temples.  Vitals:  Filed Vitals:   03/20/16 1510  BP: 117/65  Pulse: 82  Temp: 97.8 F (36.6 C)  Resp: 18   Weight:  Wt Readings from Last 3 Encounters:  03/20/16 189 lb 14.4 oz (86.138 kg)  03/13/16 189 lb 11.2 oz (86.047 kg)  02/15/16 190 lb (86.183 kg)   Lab Results  Component Value Date   WBC 2.8* 01/20/2016   HGB 12.0* 01/20/2016   HCT 36.4* 01/20/2016   MCV 102.4* 01/20/2016   PLT 69* 01/20/2016   Lab Results  Component Value Date   CREATININE 1.0 01/20/2016   BUN 7.6 01/20/2016   NA 135* 01/20/2016   K 4.5 01/20/2016   CL 102 11/16/2015   CO2 23 01/20/2016     Impression:  The patient is tolerating radiation.  Plan:  Continue treatment as planned. He finishes treatment tomorrow, 03/21/2016. He has a one month follow up scheduled 05/04/2016. He has a follow up appointment with Dr. Julien Nordmann 04/23/2016. We will check when CT scan will be scheduled (likely prior to this appointment)  ------------------------------------------------  Thea Silversmith, MD    This document serves as a record of services personally performed by Thea Silversmith, MD. It was created on her behalf by  Lendon Collar, a trained medical scribe.  The creation of this record is based on the scribe's personal observations and the provider's statements to them. This document has been checked and approved by the attending provider.

## 2016-03-20 NOTE — Progress Notes (Signed)
Ct has not been scheduled. Schedulers will cal him with appt.later in the month.

## 2016-03-20 NOTE — Progress Notes (Signed)
Andrew Shaw has received 11 fractions to chest and brain.  Skin to forehead with erythema and ears with some peeling  and chest slight redness using Sonafine.  Occasional headache,blurred vision after treatment today without his glasses was better with glasses on.  Denies memory changes,nausea or vomiting.  Had pain in his esophagus this weekend.  Coughing at time nonproductive.  Denies any cognitive changes.  EOT education done. BP 117/65 mmHg  Pulse 82  Temp(Src) 97.8 F (36.6 C) (Oral)  Resp 18  Ht 5' 6.5" (1.689 m)  Wt 189 lb 14.4 oz (86.138 kg)  BMI 30.20 kg/m2  SpO2 97%

## 2016-03-21 ENCOUNTER — Encounter: Payer: Self-pay | Admitting: Radiation Oncology

## 2016-03-21 ENCOUNTER — Ambulatory Visit
Admission: RE | Admit: 2016-03-21 | Discharge: 2016-03-21 | Disposition: A | Discharge status: Home / Self Care | Payer: Medicare Other | Source: Ambulatory Visit | Attending: Radiation Oncology | Admitting: Radiation Oncology

## 2016-03-21 ENCOUNTER — Ambulatory Visit: Payer: Medicare Other

## 2016-03-21 DIAGNOSIS — F17290 Nicotine dependence, other tobacco product, uncomplicated: Secondary | ICD-10-CM | POA: Diagnosis not present

## 2016-03-21 DIAGNOSIS — F17210 Nicotine dependence, cigarettes, uncomplicated: Secondary | ICD-10-CM | POA: Diagnosis not present

## 2016-03-21 DIAGNOSIS — C3491 Malignant neoplasm of unspecified part of right bronchus or lung: Secondary | ICD-10-CM | POA: Diagnosis not present

## 2016-03-21 DIAGNOSIS — C7931 Secondary malignant neoplasm of brain: Secondary | ICD-10-CM | POA: Diagnosis not present

## 2016-03-21 DIAGNOSIS — Z510 Encounter for antineoplastic radiation therapy: Secondary | ICD-10-CM | POA: Diagnosis not present

## 2016-03-22 ENCOUNTER — Ambulatory Visit: Payer: Medicare Other

## 2016-03-22 ENCOUNTER — Other Ambulatory Visit: Payer: Self-pay | Admitting: Cardiovascular Disease

## 2016-03-22 NOTE — Telephone Encounter (Signed)
Rx(s) sent to pharmacy electronically.  

## 2016-03-26 ENCOUNTER — Telehealth: Payer: Self-pay | Admitting: *Deleted

## 2016-03-26 NOTE — Telephone Encounter (Signed)
Oncology Nurse Navigator Documentation  Oncology Nurse Navigator Flowsheets 03/26/2016  Navigator Encounter Type Telephone  Telephone Outgoing Call  Treatment Phase Other  Barriers/Navigation Needs Education  Education Smoking cessation  Interventions Education Method  Education Method Verbal  Acuity Level 1  Time Spent with Patient 15   I followed up with Dr. Julien Nordmann regarding patient's smoking cessation.  He wanted me to encourage him to quit before he sees patient in June.  I called patient to update him.  I also went over tips to help him quit.

## 2016-03-27 ENCOUNTER — Telehealth: Payer: Self-pay | Admitting: Internal Medicine

## 2016-03-27 ENCOUNTER — Telehealth: Payer: Self-pay | Admitting: *Deleted

## 2016-03-27 NOTE — Telephone Encounter (Signed)
lvm for pt regarding to May 25 appt

## 2016-03-27 NOTE — Telephone Encounter (Signed)
Fax received from [facility]- request for lab appt 6/5 be moved to 5/24 prior to CT scan. POF sent

## 2016-03-30 ENCOUNTER — Ambulatory Visit
Admission: RE | Admit: 2016-03-30 | Discharge: 2016-03-30 | Disposition: A | Discharge status: Home / Self Care | Payer: Medicare Other | Source: Ambulatory Visit | Attending: Radiation Oncology | Admitting: Radiation Oncology

## 2016-03-30 ENCOUNTER — Other Ambulatory Visit: Payer: Self-pay | Admitting: Radiation Oncology

## 2016-03-30 ENCOUNTER — Encounter: Payer: Self-pay | Admitting: Radiation Oncology

## 2016-03-30 ENCOUNTER — Telehealth: Payer: Self-pay | Admitting: *Deleted

## 2016-03-30 VITALS — BP 134/65 | HR 78 | Temp 98.1°F | Resp 18 | Ht 66.5 in | Wt 189.0 lb

## 2016-03-30 DIAGNOSIS — C3490 Malignant neoplasm of unspecified part of unspecified bronchus or lung: Secondary | ICD-10-CM

## 2016-03-30 MED ORDER — SUCRALFATE 1 G PO TABS: 1.0000 g | ORAL_TABLET | Freq: Four times a day (QID) | ORAL | Status: DC

## 2016-03-30 NOTE — Telephone Encounter (Signed)
Called Andrew Shaw back he reported that he has blistering to his ears and the crown of his head.  No fever noticed stated I think I need to be seen and get some antibiotic.  I am still using the Sonafine. Appointment given for 4 p.m. for assessment.

## 2016-03-30 NOTE — Progress Notes (Signed)
Radiation Oncology         [phone] ________________________________  Name: Andrew Shaw MRN: [id number]  Date: 03/30/2016  DOB: 1946-10-16  Follow-Up Visit Note  CC: [name], MD  [name], *  Diagnosis:      ICD-9-CM ICD-10-CM   1. Small cell carcinoma of lung, unspecified laterality ([facility]) 162.9 C34.90      Interval Since Last Radiation:  8 days   Narrative:  The patient returns today for evaluation.  Mr. Abdulkadir Emmanuel is here today for a follow up visit for a skin issue ears,and folds behind the ears and the crown of his head with erythema and  blisters that have opened.  Mr. Batz requested a follow up appointment to be evaluated by one of the providers today.  History of lung cancer and brain cancer.    Weight changes, if any: Wt Readings from Last 3 Encounters:  03/30/16 189 lb (85.73 kg)  03/20/16 189 lb 14.4 oz (86.138 kg)  03/13/16 189 lb 11.2 oz (86.047 kg)    Respiratory complaints, if any: Denies SOB,coughing some nonproductive,unless it's a sinus cough clear to yellow Hemoptysis, if any: No  Swallowing Problems/Pain/Difficulty swallowing:Has indigestion at times Smoking Tobacco/Marijuana/Snuff/ETOH NTI:RWER 03-26-16 1/4 pack/day 50 years and E cigarette Skin:Ears with open blistered areas left worse than right and fold behind the ears with ertyhema to forehead,using Sonafine Pain :6/10 ears/crown of head taking Hydrocodone Appetite:Good Fatigue:Having fatigue in the afternoon. When is next chemo scheduled?:chemotherapy with carboplatin for AUC of 5 on day 1 and etoposide 120 MG/M2 . Last dose was given 11/02/2015 discontinued secondary to intolerance. Lab work from of chart:No recent labs                 Have you seen your medical oncologist? Date If not ,when is appointment Dr. Jana Hakim 03-22-16  Headache-frequency,location:No,having pressure on the sides of his head like skin is pulling tight Erythema/Hyperpigmentation of  forehead,scalp and ears:Forehead, ears and fold behind ears Auditory changes(manifest post radiation): No Nausea/vomiting Ataxia(unsteady gait): No Vision (Blurred/double vision,blind spots, and peripheral vsion changes): Blurred vision at times none today Fine motor movement-picking up objects with fingers,holding objects, writing, weakness of lower extremities:Problem with index finger right hand Cognitive changes: No Aphasia/Slurred speech:No BP 134/65 mmHg  Pulse 78  Temp(Src) 98.1 F (36.7 C) (Oral)  Resp 18  Ht 5' 6.5" (1.689 m)  Wt 189 lb (85.73 kg)  BMI 30.05 kg/m2  SpO2 98%                           ALLERGIES:  is allergic to cortisone; latex; lisinopril; other; antihistamines, chlorpheniramine-type; and flexeril.  Meds: Current Outpatient Prescriptions  Medication Sig Dispense Refill  . aspirin EC 81 MG tablet Take 81 mg by mouth daily.    . fenofibrate (TRICOR) 48 MG tablet Take 48 mg by mouth daily.    Marland Kitchen HYDROcodone-acetaminophen (NORCO) 10-325 MG per tablet Take 1 tablet by mouth every 4 (four) hours as needed (breakthrough pain).     . hydrocortisone (CORTEF) 20 MG tablet     . irbesartan (AVAPRO) 150 MG tablet Take 1 tablet (150 mg total) by mouth daily. PLEASE CONTACT OFFICE FOR ADDITIONAL REFILLS 30 tablet 0  . lidocaine-prilocaine (EMLA) cream Apply one application to port a cath 1-2 hours prior to access. 30 g 2  . magic mouthwash w/lidocaine SOLN Take 15 mLs by mouth 4 (four) times daily as needed for mouth  pain.  0  . metoprolol (LOPRESSOR) 50 MG tablet Take 1 tablet (50 mg total) by mouth 2 (two) times daily. NEED OV. 180 tablet 0  . OXYCONTIN 20 MG T12A 12 hr tablet Take 20 mg by mouth every 8 (eight) hours.  0  . rosuvastatin (CRESTOR) 20 MG tablet Take 20 mg by mouth daily.    Marland Kitchen LORazepam (ATIVAN) 1 MG tablet Take 1 tablet (1 mg total) by mouth at bedtime. (Patient not taking: Reported on 03/13/2016) 30 tablet 1  . mometasone (ELOCON) 0.1 % cream Apply 1  application topically daily as needed (for rash). Reported on 03/30/2016  3  . nitroGLYCERIN (NITROSTAT) 0.4 MG SL tablet Place 1 tablet (0.4 mg total) under the tongue every 5 (five) minutes x 3 doses as needed for chest pain. (Patient not taking: Reported on 03/06/2016) 25 tablet 2  . prochlorperazine (COMPAZINE) 10 MG tablet Take 1 tablet (10 mg total) by mouth every 6 (six) hours as needed for nausea or vomiting. (Patient not taking: Reported on 03/30/2016) 30 tablet 1  . sucralfate (CARAFATE) 1 g tablet Take 1 tablet (1 g total) by mouth 4 (four) times daily. 120 tablet 0   No current facility-administered medications for this encounter.    Physical Findings: The patient is in no acute distress. Patient is alert and oriented.  height is 5' 6.5" (1.689 m) and weight is 189 lb (85.73 kg). His oral temperature is 98.1 F (36.7 C). His blood pressure is 134/65 and his pulse is 78. His respiration is 18 and oxygen saturation is 98%. .   A scab is present at the superior aspect of the left ear. An area of irritation is also present at the vertex of the scalp. No broken skin on the right near.  Lab Findings: Lab Results  Component Value Date   WBC 2.8* 01/20/2016   HGB 12.0* 01/20/2016   HCT 36.4* 01/20/2016   MCV 102.4* 01/20/2016   PLT 69* 01/20/2016     Radiographic Findings: No results found.  Impression:    The patient has had some ongoing skin irritation after completing radiation treatment 8 days ago. I discussed the healing process with the patient and reassured him. I have recommended that the patient use Neosporin on the affected areas and I do believe that these will heal well. I also urge the patient not to scrub or irritate these areas in any way which I believe also has been happening. The patient also complains of some ongoing esophagitis.  Plan:  The patient will use Neosporin on the affected areas on the scalp and the ears. I have also called in a prescription for Carafate  for the patient's complaints of esophagitis.   Jodelle Gross, M.D., Ph.D.

## 2016-03-30 NOTE — Progress Notes (Signed)
Andrew Shaw is here today for a follow up visit for a skin issue ears,and folds behind the ears and the crown of his head with erythema and  blisters that have opened.  Andrew Shaw requested a follow up appointment to be evaluated by one of the providers today.  History of lung cancer and brain cancer.    Weight changes, if any: Wt Readings from Last 3 Encounters:  03/30/16 189 lb (85.73 kg)  03/20/16 189 lb 14.4 oz (86.138 kg)  03/13/16 189 lb 11.2 oz (86.047 kg)    Respiratory complaints, if any: Denies SOB,coughing some nonproductive,unless it's a sinus cough clear to yellow Hemoptysis, if any: No  Swallowing Problems/Pain/Difficulty swallowing:Has indigestion at times Smoking Tobacco/Marijuana/Snuff/ETOH TCY:ELYH 03-26-16 1/4 pack/day 50 years and E cigarette Skin:Ears with open blistered areas left worse than right and fold behind the ears with ertyhema to forehead,using Sonafine Pain :6/10 ears/crown of head taking Hydrocodone Appetite:Good Fatigue:Having fatigue in the afternoon. When is next chemo scheduled?:chemotherapy with carboplatin for AUC of 5 on day 1 and etoposide 120 MG/M2 . Last dose was given 11/02/2015 discontinued secondary to intolerance. Lab work from of chart:No recent labs                 Have you seen your medical oncologist? Date If not ,when is appointment Dr. Jana Hakim 03-22-16  Headache-frequency,location:No,having pressure on the sides of his head like skin is pulling tight Erythema/Hyperpigmentation of forehead,scalp and ears:Forehead, ears and fold behind ears Auditory changes(manifest post radiation): No Nausea/vomiting Ataxia(unsteady gait): No Vision (Blurred/double vision,blind spots, and peripheral vsion changes): Blurred vision at times none today Fine motor movement-picking up objects with fingers,holding objects, writing, weakness of lower extremities:Problem with index finger right hand Cognitive changes: No Aphasia/Slurred speech:No BP  134/65 mmHg  Pulse 78  Temp(Src) 98.1 F (36.7 C) (Oral)  Resp 18  Ht 5' 6.5" (1.689 m)  Wt 189 lb (85.73 kg)  BMI 30.05 kg/m2  SpO2 98%

## 2016-04-04 ENCOUNTER — Ambulatory Visit (HOSPITAL_COMMUNITY): Payer: Medicare Other

## 2016-04-04 ENCOUNTER — Ambulatory Visit (HOSPITAL_COMMUNITY)
Admission: RE | Admit: 2016-04-04 | Discharge: 2016-04-04 | Disposition: A | Discharge status: Home / Self Care | Payer: Medicare Other | Source: Ambulatory Visit | Attending: Internal Medicine | Admitting: Internal Medicine

## 2016-04-04 ENCOUNTER — Encounter (HOSPITAL_COMMUNITY): Payer: Self-pay

## 2016-04-04 ENCOUNTER — Telehealth: Payer: Self-pay | Admitting: Internal Medicine

## 2016-04-04 ENCOUNTER — Other Ambulatory Visit (HOSPITAL_BASED_OUTPATIENT_CLINIC_OR_DEPARTMENT_OTHER): Payer: Medicare Other

## 2016-04-04 DIAGNOSIS — Z5111 Encounter for antineoplastic chemotherapy: Secondary | ICD-10-CM

## 2016-04-04 DIAGNOSIS — K760 Fatty (change of) liver, not elsewhere classified: Secondary | ICD-10-CM | POA: Diagnosis not present

## 2016-04-04 DIAGNOSIS — R590 Localized enlarged lymph nodes: Secondary | ICD-10-CM | POA: Diagnosis not present

## 2016-04-04 DIAGNOSIS — Z9221 Personal history of antineoplastic chemotherapy: Secondary | ICD-10-CM | POA: Diagnosis not present

## 2016-04-04 DIAGNOSIS — C3490 Malignant neoplasm of unspecified part of unspecified bronchus or lung: Secondary | ICD-10-CM | POA: Diagnosis not present

## 2016-04-04 DIAGNOSIS — C3491 Malignant neoplasm of unspecified part of right bronchus or lung: Secondary | ICD-10-CM

## 2016-04-04 DIAGNOSIS — I2510 Atherosclerotic heart disease of native coronary artery without angina pectoris: Secondary | ICD-10-CM | POA: Insufficient documentation

## 2016-04-04 LAB — COMPREHENSIVE METABOLIC PANEL
ALBUMIN: 3.8 g/dL (ref 3.5–5.0)
ALK PHOS: 62 U/L (ref 40–150)
ALT: 19 U/L (ref 0–55)
ANION GAP: 10 meq/L (ref 3–11)
AST: 27 U/L (ref 5–34)
BILIRUBIN TOTAL: 0.63 mg/dL (ref 0.20–1.20)
BUN: 8.5 mg/dL (ref 7.0–26.0)
CALCIUM: 9.8 mg/dL (ref 8.4–10.4)
CO2: 24 meq/L (ref 22–29)
Chloride: 104 mEq/L (ref 98–109)
Creatinine: 1 mg/dL (ref 0.7–1.3)
EGFR: 72 mL/min/{1.73_m2} — AB (ref >90)
Glucose: 108 mg/dl (ref 70–140)
Potassium: 4.7 mEq/L (ref 3.5–5.1)
Sodium: 138 mEq/L (ref 136–145)
TOTAL PROTEIN: 7.4 g/dL (ref 6.4–8.3)

## 2016-04-04 LAB — CBC WITH DIFFERENTIAL/PLATELET
BASO%: 0 % (ref 0.0–2.0)
Basophils Absolute: 0 10*3/uL (ref 0.0–0.1)
EOS ABS: 0.1 10*3/uL (ref 0.0–0.5)
EOS%: 2.7 % (ref 0.0–7.0)
HCT: 38.8 % (ref 38.4–49.9)
HGB: 13.3 g/dL (ref 13.0–17.1)
LYMPH%: 19.9 % (ref 14.0–49.0)
MCH: 35.7 pg — ABNORMAL HIGH (ref 27.2–33.4)
MCHC: 34.3 g/dL (ref 32.0–36.0)
MCV: 104 fL — AB (ref 79.3–98.0)
MONO#: 0.5 10*3/uL (ref 0.1–0.9)
MONO%: 24.2 % — ABNORMAL HIGH (ref 0.0–14.0)
NEUT%: 53.2 % (ref 39.0–75.0)
NEUTROS ABS: 1 10*3/uL — AB (ref 1.5–6.5)
PLATELETS: 41 10*3/uL — AB (ref 140–400)
RBC: 3.73 10*6/uL — AB (ref 4.20–5.82)
RDW: 14.2 % (ref 11.0–14.6)
WBC: 1.9 10*3/uL — AB (ref 4.0–10.3)
lymph#: 0.4 10*3/uL — ABNORMAL LOW (ref 0.9–3.3)

## 2016-04-04 MED ORDER — IOPAMIDOL (ISOVUE-300) INJECTION 61%
100.0000 mL | Freq: Once | INTRAVENOUS | Status: AC | PRN
  Administered 2016-04-04: 100 mL via INTRAVENOUS

## 2016-04-04 NOTE — Telephone Encounter (Signed)
Pt camed in to r/s appt...done...the patient ok and aware of new d.t

## 2016-04-05 NOTE — Progress Notes (Signed)
  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   Beams/energy:   Opposed laterals were used on his brain fields with 6 MV photons. AP/PA fields were used for his lung fields with 10 and 15 MV photons.   Narrative: The patient tolerated radiation treatment relatively well.   He had   Plan: The patient has completed radiation treatment. The patient will return to radiation oncology clinic for routine followup in one month. I advised them to call or return sooner if they have any questions or concerns related to their recovery or treatment.  ------------------------------------------------  Thea Silversmith, MD

## 2016-04-06 ENCOUNTER — Other Ambulatory Visit: Payer: Self-pay | Admitting: Cardiovascular Disease

## 2016-04-06 NOTE — Telephone Encounter (Signed)
REFILL 

## 2016-04-12 DIAGNOSIS — Z79891 Long term (current) use of opiate analgesic: Secondary | ICD-10-CM | POA: Diagnosis not present

## 2016-04-12 DIAGNOSIS — G894 Chronic pain syndrome: Secondary | ICD-10-CM | POA: Diagnosis not present

## 2016-04-12 DIAGNOSIS — M47812 Spondylosis without myelopathy or radiculopathy, cervical region: Secondary | ICD-10-CM | POA: Diagnosis not present

## 2016-04-12 DIAGNOSIS — M4726 Other spondylosis with radiculopathy, lumbar region: Secondary | ICD-10-CM | POA: Diagnosis not present

## 2016-04-16 ENCOUNTER — Other Ambulatory Visit: Payer: Medicare Other

## 2016-04-16 ENCOUNTER — Ambulatory Visit (HOSPITAL_BASED_OUTPATIENT_CLINIC_OR_DEPARTMENT_OTHER): Payer: Medicare Other | Admitting: Internal Medicine

## 2016-04-16 ENCOUNTER — Encounter: Payer: Self-pay | Admitting: Internal Medicine

## 2016-04-16 ENCOUNTER — Telehealth: Payer: Self-pay | Admitting: Internal Medicine

## 2016-04-16 VITALS — BP 135/67 | HR 99 | Temp 97.8°F | Resp 18 | Ht 66.5 in | Wt 184.6 lb

## 2016-04-16 DIAGNOSIS — C7931 Secondary malignant neoplasm of brain: Secondary | ICD-10-CM | POA: Diagnosis not present

## 2016-04-16 DIAGNOSIS — C3491 Malignant neoplasm of unspecified part of right bronchus or lung: Secondary | ICD-10-CM

## 2016-04-16 DIAGNOSIS — D61818 Other pancytopenia: Secondary | ICD-10-CM

## 2016-04-16 NOTE — Telephone Encounter (Signed)
per pof to sch pt appt-gave pt copy of avs-adv [facility] ewill call to sch trmt

## 2016-04-16 NOTE — Progress Notes (Signed)
[place] Telephone:([phone]) [phone]   [phone]  OFFICE PROGRESS NOTE  [name], [address]  [place] [address]  DIAGNOSIS: Extensive stage (TX, N2, M1b) small cell lung cancer in September 2016 presenting with right hilar and mediastinal lymphadenopathy as well as highly suspicious metastatic disease in the axilla bilaterally.   PRIOR THERAPY:  1) Systemic chemotherapy with carboplatin for AUC of 5 on day 1 and etoposide 120 MG/M2 on days one 2, 3 with Neulasta support on day 4. Status post 4 cycles. Last dose was given 11/02/2015 discontinued secondary to intolerance. 2) whole brain irradiation to multiple metastatic brain lesions under the care of Dr. Pablo Ledger completed 03/21/2016.  CURRENT THERAPY: Observation.  INTERVAL HISTORY: Andrew Shaw 70 y.o. male returns to the clinic today for follow-up visit accompanied by his daughter. He is feeling fine today except for mild fatigue and occasional hemorrhoidal bleed. He completed 4 cycles of systemic chemotherapy with carboplatin and etoposide discontinued secondary to intolerance with significant pancytopenia.. He has been on observation for the last 6 months and feeling much better except for mild fatigue. He was found in April 2017 to have multiple new metastatic brain lesions and the patient completed a course of whole brain irradiation under the care of Dr. Pablo Ledger on 03/21/2016. The patient denied having any significant bleeding issues. He denied having any chest pain, shortness of breath, cough or hemoptysis. He denied having any significant nausea or vomiting. No significant weight loss or night sweats. He has healing shingles rash on the left leg. He also has radiation-induced rash on the right side of the back of the chest. He has no fever or chills. He has repeat CT scan of the chest, abdomen and pelvis performed recently and he is here for evaluation and discussion of his  scan results.  MEDICAL HISTORY: Past Medical History  Diagnosis Date  . Coronary artery disease     2D ECHO, 10/31/2010 - EF >55%, normal; NUCLEAR STRESS TEST, 10/23/2010 - perfusion defect in inferior myocardial region, post-stress EF 62%, EKG negative for ischemia  . Hypertension   . Hyperlipemia   . Chronic back pain   . DVT (deep venous thrombosis) ([facility])     history 2004 after knee surg  . BPH (benign prostatic hyperplasia)   . Arthritis   . Neuromuscular disorder ([facility])   . Carpal tunnel syndrome   . 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 GERD (gastroesophageal reflux disease)   . Myocardial infarction ([facility]) 2005    from steroids  . S/P angioplasty with stent, BMS to LCX  12/23/12 12/23/2012  . Back pain 12/23/2012  . Shortness of breath     with exertion  . COPD (chronic obstructive pulmonary disease) ([facility])   . Carpal tunnel syndrome, right   . Skin rash 08/22/2015  . Lung cancer ([facility])     ALLERGIES:  is allergic to cortisone; latex; lisinopril; other; antihistamines, chlorpheniramine-type; and flexeril.  MEDICATIONS:  Current Outpatient Prescriptions  Medication Sig Dispense Refill  . aspirin EC 81 MG tablet Take 81 mg by mouth daily.    . fenofibrate (TRICOR) 48 MG tablet Take 48 mg by mouth daily.    Marland Kitchen HYDROcodone-acetaminophen (NORCO) 10-325 MG per tablet Take 1 tablet by mouth every 4 (four) hours as needed (breakthrough pain).     . hydrocortisone (CORTEF) 20 MG tablet     . irbesartan (AVAPRO) 150 MG tablet  Take 1 tablet (150 mg total) by mouth daily. PLEASE CONTACT OFFICE FOR ADDITIONAL REFILLS 30 tablet 0  . lidocaine-prilocaine (EMLA) cream Apply one application to port a cath 1-2 hours prior to access. 30 g 2  . LORazepam (ATIVAN) 1 MG tablet Take 1 tablet (1 mg total) by mouth at bedtime. (Patient not taking: Reported on 03/13/2016) 30 tablet 1  . magic mouthwash w/lidocaine SOLN Take 15 mLs by mouth 4 (four) times daily as needed for mouth pain.  0  .  metoprolol (LOPRESSOR) 50 MG tablet Take 1 tablet (50 mg total) by mouth 2 (two) times daily. NEED OV. 180 tablet 0  . mometasone (ELOCON) 0.1 % cream Apply 1 application topically daily as needed (for rash). Reported on 03/30/2016  3  . nitroGLYCERIN (NITROSTAT) 0.4 MG SL tablet Place 1 tablet (0.4 mg total) under the tongue every 5 (five) minutes x 3 doses as needed for chest pain. (Patient not taking: Reported on 03/06/2016) 25 tablet 2  . nystatin (MYCOSTATIN) 100000 UNIT/ML suspension TAKE 5 ML BY MOUTH FOUR TIMES DAILY 60 mL 0  . Oxycodone HCl 20 MG TABS Take 20 mg by mouth every 8 (eight) hours as needed.  0  . OXYCONTIN 20 MG T12A 12 hr tablet Take 20 mg by mouth every 8 (eight) hours.  0  . prochlorperazine (COMPAZINE) 10 MG tablet Take 1 tablet (10 mg total) by mouth every 6 (six) hours as needed for nausea or vomiting. (Patient not taking: Reported on 03/30/2016) 30 tablet 1  . rosuvastatin (CRESTOR) 20 MG tablet Take 20 mg by mouth daily.    . sucralfate (CARAFATE) 1 g tablet TAKE 1 TABLET BY MOUTH FOUR TIMES DAILY 360 tablet 0   No current facility-administered medications for this visit.    SURGICAL HISTORY:  Past Surgical History  Procedure Laterality Date  . Knee arthroscopy  04,06    left  . Joint replacement Left 2008    lt total knee  . Manipulation knee joint Left 2009    closed lt knee   . Hernia repair  2008    umb   . Cervical fusion  1999  . Back surgery  2004    lumb fusion  . Epidural block injection      multiple lumbar  . Carpal tunnel release  04/15/2012    Procedure: CARPAL TUNNEL RELEASE;  Surgeon: Wynonia Sours, MD;  Location: [facility];  Service: Orthopedics;  Laterality: Left;  . Trigger finger release  04/15/2012    Procedure: RELEASE TRIGGER FINGER/A-1 PULLEY;  Surgeon: Wynonia Sours, MD;  Location: [facility];  Service: Orthopedics;  Laterality: Left;  left thumb and little finger  . Cardiac catheterization  12/23/2012    Mid  nondominant AV groove circumflex stented with a 2.5x53m Mini Vision stent resulting in a reduction of 90% stenosis to 0% residual  . Cardiac catheterization  02/07/2004    Noncritical CAD, continue medical therapy  . Cardiac catheterization  02/03/1999    Recommended medical therapy  . Left heart catheterization with coronary angiogram N/A 12/23/2012    Procedure: LEFT HEART CATHETERIZATION WITH CORONARY ANGIOGRAM;  Surgeon: JLorretta Harp MD;  [facility];  Service: Cardiovascular;  Laterality: N/A;  . Percutaneous coronary stent intervention (pci-s)  12/23/2012    Procedure: PERCUTANEOUS CORONARY STENT INTERVENTION (PCI-S);  Surgeon: JLorretta Harp MD;  [facility];  Service: Cardiovascular;;  . Left heart catheterization with coronary angiogram N/A  01/11/2015    Procedure: LEFT HEART CATHETERIZATION WITH CORONARY ANGIOGRAM;  Surgeon: Peter M Martinique, MD;  Location[facility] C[facility] LAB;  Service: Cardiovascular;  Laterality: N/A;  . Colonoscopy w/ polypectomy    . Video bronchoscopy with endobronchial ultrasound N/A 07/28/2015    Procedure: VIDEO BRONCHOSCOPY WITH ENDOBRONCHIAL ULTRASOUND;  Surgeon: [name], MD;  Location: [facility];  Service: Thoracic;  Laterality: N/A;    REVIEW OF SYSTEMS:  A comprehensive review of systems was negative except for: Constitutional: positive for fatigue Integument/breast: positive for rash   PHYSICAL EXAMINATION: General appearance: alert, cooperative, fatigued and no distress Head: Normocephalic, without obvious abnormality, atraumatic Neck: no adenopathy, no JVD, supple, symmetrical, trachea midline and thyroid not enlarged, symmetric, no tenderness/mass/nodules Lymph nodes: Cervical, supraclavicular, and axillary nodes normal. Resp: clear to auscultation bilaterally Back: symmetric, no curvature. ROM normal. No CVA tenderness. Cardio: regular rate and rhythm, S1, S2 normal, no murmur, click, rub or gallop GI: soft, non-tender;  bowel sounds normal; no masses,  no organomegaly Extremities: extremities normal, atraumatic, no cyanosis or edema Neurologic: Alert and oriented X 3, normal strength and tone. Normal symmetric reflexes. Normal coordination and gait    ECOG PERFORMANCE STATUS: 1 - Symptomatic but completely ambulatory  There were no vitals taken for this visit.  LABORATORY DATA: Lab Results  Component Value Date   WBC 1.9* 04/04/2016   HGB 13.3 04/04/2016   HCT 38.8 04/04/2016   MCV 104.0* 04/04/2016   PLT 41* 04/04/2016      Chemistry      Component Value Date/Time   NA 138 04/04/2016 0810   NA 134* 11/16/2015 0457   K 4.7 04/04/2016 0810   K 3.5 11/16/2015 0457   CL 102 11/16/2015 0457   CO2 24 04/04/2016 0810   CO2 26 11/16/2015 0457   BUN 8.5 04/04/2016 0810   BUN 10 11/16/2015 0457   CREATININE 1.0 04/04/2016 0810   CREATININE 0.68 11/16/2015 0457      Component Value Date/Time   CALCIUM 9.8 04/04/2016 0810   CALCIUM 8.3* 11/16/2015 0457   ALKPHOS 62 04/04/2016 0810   ALKPHOS 71 11/16/2015 0457   AST 27 04/04/2016 0810   AST 24 11/16/2015 0457   ALT 19 04/04/2016 0810   ALT 50 11/16/2015 0457   BILITOT 0.63 04/04/2016 0810   BILITOT 0.3 11/16/2015 0457       RADIOGRAPHIC STUDIES: Ct Chest W Contrast  04/04/2016  CLINICAL DATA:  Lung cancer, chemotherapy and radiation therapy. EXAM: CT CHEST, ABDOMEN, AND PELVIS WITH CONTRAST TECHNIQUE: Multidetector CT imaging of the chest, abdomen and pelvis was performed following the standard protocol during bolus administration of intravenous contrast. CONTRAST:  138m ISOVUE-300 IOPAMIDOL (ISOVUE-300) INJECTION 61% COMPARISON:  01/20/2016. FINDINGS: CT CHEST FINDINGS Mediastinum/Lymph Nodes: Right IJ Port-A-Cath terminates at the SVC RA junction. 14 mm heterogeneous nodule in the posterior left thyroid, stable. No pathologically enlarged mediastinal, hilar or axillary lymph nodes. Specifically, subcarinal lymph node measures 11 mm,  previously 2.1 cm and right hilar lymph node, 6 mm, previously 9 mm. Three-vessel coronary artery calcification. Heart size normal. No pericardial effusion. Lungs/Pleura: Minimal scattered mucoid impaction, peribronchovascular nodularity and dependent atelectasis. Lungs are otherwise clear. No pleural fluid. Airway is unremarkable. Musculoskeletal: No worrisome lytic or sclerotic lesions. CT ABDOMEN PELVIS FINDINGS Hepatobiliary: Liver is slightly decreased in attenuation diffusely. Liver and gallbladder are otherwise unremarkable. No biliary ductal dilatation. Pancreas: Negative. Spleen: Negative. Adrenals/Urinary Tract: Adrenal glands and right kidney are unremarkable. Sub cm low-attenuation lesion in  the upper pole left kidney is too small to characterize but statistically, likely represents a cyst. Ureters are decompressed. Bladder is low in volume. Stomach/Bowel: Stomach, small bowel and colon are unremarkable. Appendix is not readily visualized. Vascular/Lymphatic: Atherosclerotic calcification of the arterial vasculature without abdominal aortic aneurysm. No pathologically enlarged lymph nodes. Reproductive: Prostate appears prominent. Other: No free fluid. Question previous periumbilical hernia repair. Mesenteries and peritoneum are unremarkable. Musculoskeletal: No worrisome lytic or sclerotic lesions. IMPRESSION: 1. Improved subcarinal and right hilar adenopathy. No additional evidence of metastatic disease. 2. Three-vessel coronary artery calcification. 3. Hepatic steatosis. Electronically Signed   By: Lorin Picket M.D.   On: 04/04/2016 09:59   Ct Abdomen Pelvis W Contrast  04/04/2016  CLINICAL DATA:  Lung cancer, chemotherapy and radiation therapy. EXAM: CT CHEST, ABDOMEN, AND PELVIS WITH CONTRAST TECHNIQUE: Multidetector CT imaging of the chest, abdomen and pelvis was performed following the standard protocol during bolus administration of intravenous contrast. CONTRAST:  111m ISOVUE-300  IOPAMIDOL (ISOVUE-300) INJECTION 61% COMPARISON:  01/20/2016. FINDINGS: CT CHEST FINDINGS Mediastinum/Lymph Nodes: Right IJ Port-A-Cath terminates at the SVC RA junction. 14 mm heterogeneous nodule in the posterior left thyroid, stable. No pathologically enlarged mediastinal, hilar or axillary lymph nodes. Specifically, subcarinal lymph node measures 11 mm, previously 2.1 cm and right hilar lymph node, 6 mm, previously 9 mm. Three-vessel coronary artery calcification. Heart size normal. No pericardial effusion. Lungs/Pleura: Minimal scattered mucoid impaction, peribronchovascular nodularity and dependent atelectasis. Lungs are otherwise clear. No pleural fluid. Airway is unremarkable. Musculoskeletal: No worrisome lytic or sclerotic lesions. CT ABDOMEN PELVIS FINDINGS Hepatobiliary: Liver is slightly decreased in attenuation diffusely. Liver and gallbladder are otherwise unremarkable. No biliary ductal dilatation. Pancreas: Negative. Spleen: Negative. Adrenals/Urinary Tract: Adrenal glands and right kidney are unremarkable. Sub cm low-attenuation lesion in the upper pole left kidney is too small to characterize but statistically, likely represents a cyst. Ureters are decompressed. Bladder is low in volume. Stomach/Bowel: Stomach, small bowel and colon are unremarkable. Appendix is not readily visualized. Vascular/Lymphatic: Atherosclerotic calcification of the arterial vasculature without abdominal aortic aneurysm. No pathologically enlarged lymph nodes. Reproductive: Prostate appears prominent. Other: No free fluid. Question previous periumbilical hernia repair. Mesenteries and peritoneum are unremarkable. Musculoskeletal: No worrisome lytic or sclerotic lesions. IMPRESSION: 1. Improved subcarinal and right hilar adenopathy. No additional evidence of metastatic disease. 2. Three-vessel coronary artery calcification. 3. Hepatic steatosis. Electronically Signed   By: MLorin PicketM.D.   On: 04/04/2016 09:59     ASSESSMENT AND PLAN: This is a very pleasant 70years old white male recently diagnosed with extensive stage small cell lung cancer currently undergoing systemic chemotherapy with carboplatin and etoposide status post 4 cycles and tolerating his treatment well except for pancytopenia.  He has been observation for the last 6 months. He was found to months ago to have multiple new metastatic brain lesions and the patient underwent whole brain irradiation. The recent CT scan of the chest, abdomen and pelvis showed no evidence for disease progression that there was further improvement of the subcarinal and right hilar adenopathy. I discussed the scan results with the patient and his family. I recommended for him to continue on observation.. I will see him back for follow-up visit in 3 months for reevaluation after repeating CT scan of the chest, abdomen and pelvis for restaging of his disease. For the persistent pancytopenia, I will continue to monitor it closely. The patient and his family agreed to the current plan. The patient was advised to call immediately if  he has any concerning symptoms in the interval. The patient voices understanding of current disease status and treatment options and is in agreement with the current care plan.  All questions were answered. The patient knows to call the clinic with any problems, questions or concerns. We can certainly see the patient much sooner if necessary.  Disclaimer: This note was dictated with voice recognition software. Similar sounding words can inadvertently be transcribed and may not be corrected upon review.

## 2016-04-21 ENCOUNTER — Other Ambulatory Visit: Payer: Self-pay | Admitting: Cardiovascular Disease

## 2016-04-23 ENCOUNTER — Ambulatory Visit: Payer: Medicare Other | Admitting: Internal Medicine

## 2016-04-23 NOTE — Telephone Encounter (Signed)
Rx(s) sent to pharmacy electronically. Patient has appt w/MD 05/08/16

## 2016-05-04 ENCOUNTER — Ambulatory Visit: Payer: Medicare Other | Admitting: Radiation Oncology

## 2016-05-07 ENCOUNTER — Telehealth: Payer: Self-pay | Admitting: Physician Assistant

## 2016-05-07 DIAGNOSIS — M47812 Spondylosis without myelopathy or radiculopathy, cervical region: Secondary | ICD-10-CM | POA: Diagnosis not present

## 2016-05-07 DIAGNOSIS — G894 Chronic pain syndrome: Secondary | ICD-10-CM | POA: Diagnosis not present

## 2016-05-07 DIAGNOSIS — Z79891 Long term (current) use of opiate analgesic: Secondary | ICD-10-CM | POA: Diagnosis not present

## 2016-05-07 DIAGNOSIS — M4726 Other spondylosis with radiculopathy, lumbar region: Secondary | ICD-10-CM | POA: Diagnosis not present

## 2016-05-07 NOTE — Telephone Encounter (Signed)
Pt wife called because Mr Bally's BP is low and he is feeling poorly.  He has had chemo and has problems with diarrhea 2nd to that. Fuid intake > food but fluid intake is good.   His SBP was 103 earlier and is in the 90s now. He feels poorly, wife wants to know what to do?  Pt is on irbesartan 150 mg qd and metoprolol 50 mg bid. Has not had pm metoprolol yet.  Advised him to hold the irbesartan. Hold the metoprolol tonight and check BP in am. If SBP < 120, hold metoprolol again.  If sx worsen, go to ER. If can drink more water and is doing ok, hold rx as directed and keep appt w/ Dr Gwenlyn Found.  Lenoard Aden 05/07/2016 7:37 PM [facility] [phone]

## 2016-05-08 ENCOUNTER — Ambulatory Visit (INDEPENDENT_AMBULATORY_CARE_PROVIDER_SITE_OTHER): Payer: Medicare Other | Admitting: Cardiovascular Disease

## 2016-05-08 ENCOUNTER — Telehealth: Payer: Self-pay | Admitting: *Deleted

## 2016-05-08 ENCOUNTER — Encounter: Payer: Self-pay | Admitting: Cardiovascular Disease

## 2016-05-08 VITALS — BP 86/56 | HR 86 | Ht 66.5 in | Wt 177.5 lb

## 2016-05-08 DIAGNOSIS — Z0000 Encounter for general adult medical examination without abnormal findings: Secondary | ICD-10-CM | POA: Diagnosis not present

## 2016-05-08 DIAGNOSIS — Z79899 Other long term (current) drug therapy: Secondary | ICD-10-CM

## 2016-05-08 DIAGNOSIS — E860 Dehydration: Secondary | ICD-10-CM | POA: Diagnosis not present

## 2016-05-08 DIAGNOSIS — R197 Diarrhea, unspecified: Secondary | ICD-10-CM | POA: Diagnosis not present

## 2016-05-08 DIAGNOSIS — E785 Hyperlipidemia, unspecified: Secondary | ICD-10-CM

## 2016-05-08 DIAGNOSIS — I2583 Coronary atherosclerosis due to lipid rich plaque: Secondary | ICD-10-CM

## 2016-05-08 DIAGNOSIS — I10 Essential (primary) hypertension: Secondary | ICD-10-CM

## 2016-05-08 DIAGNOSIS — L259 Unspecified contact dermatitis, unspecified cause: Secondary | ICD-10-CM | POA: Diagnosis not present

## 2016-05-08 DIAGNOSIS — I2510 Atherosclerotic heart disease of native coronary artery without angina pectoris: Secondary | ICD-10-CM

## 2016-05-08 NOTE — Telephone Encounter (Signed)
Upon leaving his appointment this morning, the fire drill was sounding very loudly. He had question about his BP medication and if he should continue taking it with his BP being so low. At that time, I said I would ask Dr Gwenlyn Found and give him a call back.  I consulted with Dr Gwenlyn Found and he advised for patient to not take his irbesartan if his BP <120.  Patient verbalized understanding and repeated the instructions back to me. No further questions at this time.

## 2016-05-08 NOTE — Assessment & Plan Note (Signed)
History of hypertension blood pressure measured to 86/56. He is on metoprolol and Avapro.Marland Kitchen He said a week of diarrhea probably is responsible for his low blood pressure. Continue current meds at current dosing

## 2016-05-08 NOTE — Assessment & Plan Note (Signed)
History of hyperlipidemia on statin therapy followed by his PCP 

## 2016-05-08 NOTE — Progress Notes (Signed)
05/08/2016 [place]   November 20, 1945  [id number]  Primary Physician [name], MD Primary Cardiologist: Lorretta Harp MD Andrew Shaw  HPI:  The patient is a 70 year old, overweight Caucasian male with history of coronary artery disease with moderate coronary artery disease by prior cath in 2005.I last saw him in the office 01/14/15 He had a diagnostic cath December 23, 2012, which revealed 90% circumflex stenosis, 50% to 60% RCA stenosis in the mid vessel. He had a normal LAD and normal left main. His ejection fraction at that time was 60% without wall motion abnormality. He underwent bare-metal stenting of the circumflex. He had an issue with hematochezia during admission or complained of it. He is doing well today but at his previous office visit on January 06, 2013, he was complaining of some angioedema and rash on his face as well as some facial swelling. At that time, he had been started on lisinopril and Brilinta. Lisinopril was stopped by Kerin Ransom, and so I am seeing him today in followup. The patient still has a little bit of redness in his face; however, the angioedema and periorbital edema has essentially cleared up. He denies any nausea, vomiting, shortness of breath, dizziness, orthopnea, chest pain, lower extremity edema, facial edema.he developed some left calf pain followed by chest pain which resulted in admission to [facility]. He ruled out for myocardial infarction and underwent diagnostic clinic after Dr. 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 He did have a d-dimer 0.69, minimally elevated. I saw him last and diagnosed with small cell cancer undergoing chemotherapy and radiation therapy he denies chest pain or shortness of breath.   Current Outpatient Prescriptions  Medication Sig Dispense Refill  . aspirin EC 81 MG tablet Take 81 mg by  mouth daily.    . fenofibrate (TRICOR) 48 MG tablet Take 48 mg by mouth daily.    Marland Kitchen HYDROcodone-acetaminophen (NORCO) 10-325 MG per tablet Take 1 tablet by mouth every 4 (four) hours as needed (breakthrough pain).     . irbesartan (AVAPRO) 150 MG tablet TAKE 1 TABLET(150 MG) BY MOUTH DAILY 30 tablet 0  . lidocaine-prilocaine (EMLA) cream Apply one application to port a cath 1-2 hours prior to access. 30 g 2  . LORazepam (ATIVAN) 1 MG tablet Take 1 tablet (1 mg total) by mouth at bedtime. 30 tablet 1  . metoprolol (LOPRESSOR) 50 MG tablet Take 1 tablet (50 mg total) by mouth 2 (two) times daily. NEED OV. 180 tablet 0  . metoprolol (LOPRESSOR) 50 MG tablet TAKE 1 TABLET BY MOUTH TWICE DAILY 60 tablet 0  . mometasone (ELOCON) 0.1 % cream Apply 1 application topically daily as needed (for rash). Reported on 03/30/2016  3  . nitroGLYCERIN (NITROSTAT) 0.4 MG SL tablet Place 1 tablet (0.4 mg total) under the tongue every 5 (five) minutes x 3 doses as needed for chest pain. 25 tablet 2  . nystatin (MYCOSTATIN) 100000 UNIT/ML suspension TAKE 5 ML BY MOUTH FOUR TIMES DAILY 60 mL 0  . OXYCONTIN 20 MG T12A 12 hr tablet Take 20 mg by mouth every 8 (eight) hours.  0  . prochlorperazine (COMPAZINE) 10 MG tablet Take 1 tablet (10 mg total) by mouth every 6 (six) hours as needed for nausea or vomiting. 30 tablet 1  . rosuvastatin (CRESTOR) 20 MG tablet Take 20 mg by mouth daily.    Marland Kitchen  sucralfate (CARAFATE) 1 g tablet TAKE 1 TABLET BY MOUTH FOUR TIMES DAILY 360 tablet 0   No current facility-administered medications for this visit.    Allergies  Allergen Reactions  . Cortisone Other (See Comments)    MI  . Latex Rash  . Lisinopril Rash and Other (See Comments)    Swelling of face  . Other Rash    Dr Malachi Bonds  . Antihistamines, Chlorpheniramine-Type Itching  . Flexeril [Cyclobenzaprine] Itching    Social History   Social History  . Marital Status: Married    Spouse Name: N/A  . Number of Children: N/A    . Years of Education: N/A   Occupational History  . Not on file.   Social History Main Topics  . Smoking status: Current Some Day Smoker -- 0.25 packs/day for 50 years    Types: Cigarettes, E-cigarettes  . Smokeless tobacco: Never Used     Comment: 1 pack will last pt 2 days//ee 3.2.17  . Alcohol Use: No  . Drug Use: No  . Sexual Activity: Not on file   Other Topics Concern  . Not on file   Social History Narrative     Review of Systems: General: negative for chills, fever, night sweats or weight changes.  Cardiovascular: negative for chest pain, dyspnea on exertion, edema, orthopnea, palpitations, paroxysmal nocturnal dyspnea or shortness of breath Dermatological: negative for rash Respiratory: negative for cough or wheezing Urologic: negative for hematuria Abdominal: negative for nausea, vomiting, diarrhea, bright red blood per rectum, melena, or hematemesis Neurologic: negative for visual changes, syncope, or dizziness All other systems reviewed and are otherwise negative except as noted above.    Blood pressure 86/56, pulse 86, height 5' 6.5" (1.689 m), weight 177 lb 8 oz (80.513 kg).  General appearance: alert and no distress Neck: no adenopathy, no carotid bruit, no JVD, supple, symmetrical, trachea midline and thyroid not enlarged, symmetric, no tenderness/mass/nodules Lungs: clear to auscultation bilaterally Heart: regular rate and rhythm, S1, S2 normal, no murmur, click, rub or gallop Abdomen: soft, non-tender; bowel sounds normal; no masses,  no organomegaly  EKG normal sinus rhythm at 86 with T-wave changes. Previously reviewed  ASSESSMENT AND PLAN:   CAD (coronary artery disease), cath 2005 with non obstructive disease, now 12/2102 with LCX stenosis culprit vessel History of CAD status post circumflex bare metal stenting by myself 12/23/12. The right radial approach. He developed diffuse 50% RCA lesion as well. He do not repeat catheterization by Dr. Martinique  sometime after that. Unfortunately is unable to get up his right radial and cath him radially revealing the stent to be widely patent with some in-stent restenosis that was not felt to be physiologically significant by FFR. The RCA but could not change in medical therapy was recommended.  Dyslipidemia History of hyperlipidemia on statin therapy followed by his PCP  HTN (hypertension) History of hypertension blood pressure measured to 86/56. He is on metoprolol and Avapro.Marland Kitchen He said a week of diarrhea probably is responsible for his low blood pressure. Continue current meds at current dosing      Lorretta Harp MD [facility], [facility] 05/08/2016 11:51 AM

## 2016-05-08 NOTE — Patient Instructions (Signed)
Medication Instructions:  Your physician recommends that you continue on your current medications as directed. Please refer to the Current Medication list given to you today.   Labwork: Your physician recommends that you return for lab work SOMETIME [place] 2-3 MONTHS.   Follow-Up: Your physician wants you to follow-up in: [place]. You will receive a reminder letter in the mail two months in advance. If you don't receive a letter, please call our office to schedule the follow-up appointment.   Any Other Special Instructions Will Be Listed Below (If Applicable).     If you need a refill on your cardiac medications before your next appointment, please call your pharmacy.

## 2016-05-08 NOTE — Assessment & Plan Note (Signed)
History of CAD status post circumflex bare metal stenting by myself 12/23/12. The right radial approach. He developed diffuse 50% RCA lesion as well. He do not repeat catheterization by Dr. Martinique sometime after that. Unfortunately is unable to get up his right radial and cath him radially revealing the stent to be widely patent with some in-stent restenosis that was not felt to be physiologically significant by FFR. The RCA but could not change in medical therapy was recommended.

## 2016-05-16 ENCOUNTER — Telehealth: Payer: Self-pay

## 2016-05-16 ENCOUNTER — Telehealth: Payer: Self-pay | Admitting: *Deleted

## 2016-05-16 ENCOUNTER — Other Ambulatory Visit: Payer: Self-pay | Admitting: Nurse Practitioner

## 2016-05-16 DIAGNOSIS — C3490 Malignant neoplasm of unspecified part of unspecified bronchus or lung: Secondary | ICD-10-CM

## 2016-05-16 NOTE — Telephone Encounter (Addendum)
Message and voice mail received from [place]. Unclear why pt PCP unable to see pt. Called pt who advised " my hands look like I have washed them in poison ivy. The places where they have done radiation; they itch all the time. Everyone just pushes me off on someone else. Dr Alroy Dust said he didn't want to get involved. He ended up calling in some cream but nothing is working. I haven't been out of the house in 3 wks for the diarrhea. Dr. Alroy Dust said it was coming from Radiation. Radiation says it's coming from Chemo. I told them I had chemo back in January." Pt denies fever, chills or night sweats, n/v, headaches. "When I eat it will run straight through me." Denies any problems with urine. "Hot water makes it worse, benadryl lotion will ease it off for about 62mns, then it starts itching again. The back of my ears are scaley like snake skin. Sometimes I get dizzy when I stand up. My body doesn't want to go when my head turns. Dr. BGwenlyn Foundstopped all my BP medicine." . Dermatologist Dr. L[facility]lyson [facility]ast [facility]   Reviewed with MD. [facility]  Discussed with pt scheduling will call him with an appt time for [facility] verbalized understanding. POF to scheduling

## 2016-05-16 NOTE — Telephone Encounter (Signed)
This RN assisted fellow nurse per need to be seen ASAP by symptom management per contact by pt with Radiation Oncology. No schedulers available due to after hours. See notes per Dr Worthy Flank nurse and nurse in Radiation for need to be seen.

## 2016-05-16 NOTE — Telephone Encounter (Signed)
Andrew Shaw was called today to reschedule his follow up appointment originally scheduled with Dr. Pablo Ledger to Dr. Isidore Moos. He mentioned to our scheduler that he was "still having problems". I called and spoke to Andrew Shaw to see how he was doing. He described on-going diarrhea for 3-4 weeks and multiple areas of skin problems that were itching. The areas he reported were over his hands, ears, chest, and feet. I explained that these symptoms were likely not related to the Radiation he received to his whole brain and right hilum. He had seen his PCP about these concerns, but states they asked that he call his Cancer doctors because of his recent Chemotherapy and Radiation. I suggested he call Dr. Earlie Server with these concerns, but he declined. I called Dr. Lew Dawes nurse and left a voice mail with the above information. I also informed Andrew Shaw that scheduling was working on getting him to the Radiation clinic as soon as possible. He voiced his understanding and knows to call me if he has any further concerns.

## 2016-05-16 NOTE — Addendum Note (Signed)
Addended by: Lucile Crater on: 05/16/2016 05:07 PM   Modules accepted: Orders

## 2016-05-17 ENCOUNTER — Other Ambulatory Visit (HOSPITAL_BASED_OUTPATIENT_CLINIC_OR_DEPARTMENT_OTHER): Payer: Medicare Other

## 2016-05-17 ENCOUNTER — Ambulatory Visit (HOSPITAL_BASED_OUTPATIENT_CLINIC_OR_DEPARTMENT_OTHER): Payer: Medicare Other | Admitting: Nurse Practitioner

## 2016-05-17 ENCOUNTER — Telehealth: Payer: Self-pay | Admitting: *Deleted

## 2016-05-17 ENCOUNTER — Other Ambulatory Visit: Payer: Self-pay | Admitting: *Deleted

## 2016-05-17 VITALS — BP 126/62 | HR 84 | Temp 97.9°F | Resp 17 | Ht 66.5 in | Wt 175.5 lb

## 2016-05-17 DIAGNOSIS — C3490 Malignant neoplasm of unspecified part of unspecified bronchus or lung: Secondary | ICD-10-CM

## 2016-05-17 DIAGNOSIS — R21 Rash and other nonspecific skin eruption: Secondary | ICD-10-CM | POA: Diagnosis not present

## 2016-05-17 DIAGNOSIS — E860 Dehydration: Secondary | ICD-10-CM

## 2016-05-17 DIAGNOSIS — R630 Anorexia: Secondary | ICD-10-CM

## 2016-05-17 DIAGNOSIS — D696 Thrombocytopenia, unspecified: Secondary | ICD-10-CM

## 2016-05-17 DIAGNOSIS — R197 Diarrhea, unspecified: Secondary | ICD-10-CM | POA: Diagnosis not present

## 2016-05-17 LAB — CBC WITH DIFFERENTIAL/PLATELET
BASO%: 0 % (ref 0.0–2.0)
BASOS ABS: 0 10*3/uL (ref 0.0–0.1)
EOS%: 2.4 % (ref 0.0–7.0)
Eosinophils Absolute: 0.1 10*3/uL (ref 0.0–0.5)
HEMATOCRIT: 34.4 % — AB (ref 38.4–49.9)
HGB: 11.7 g/dL — ABNORMAL LOW (ref 13.0–17.1)
LYMPH%: 12.5 % — AB (ref 14.0–49.0)
MCH: 35.2 pg — AB (ref 27.2–33.4)
MCHC: 34 g/dL (ref 32.0–36.0)
MCV: 103.6 fL — ABNORMAL HIGH (ref 79.3–98.0)
MONO#: 0.4 10*3/uL (ref 0.1–0.9)
MONO%: 12.5 % (ref 0.0–14.0)
NEUT#: 2.1 10*3/uL (ref 1.5–6.5)
NEUT%: 72.6 % (ref 39.0–75.0)
PLATELETS: 60 10*3/uL — AB (ref 140–400)
RBC: 3.32 10*6/uL — AB (ref 4.20–5.82)
RDW: 13.9 % (ref 11.0–14.6)
WBC: 2.9 10*3/uL — ABNORMAL LOW (ref 4.0–10.3)
lymph#: 0.4 10*3/uL — ABNORMAL LOW (ref 0.9–3.3)

## 2016-05-17 LAB — COMPREHENSIVE METABOLIC PANEL
ALT: 12 U/L (ref 0–55)
ANION GAP: 10 meq/L (ref 3–11)
AST: 19 U/L (ref 5–34)
Albumin: 3.4 g/dL — ABNORMAL LOW (ref 3.5–5.0)
Alkaline Phosphatase: 69 U/L (ref 40–150)
BUN: 14.5 mg/dL (ref 7.0–26.0)
CALCIUM: 9.4 mg/dL (ref 8.4–10.4)
CHLORIDE: 105 meq/L (ref 98–109)
CO2: 20 meq/L — AB (ref 22–29)
CREATININE: 1.2 mg/dL (ref 0.7–1.3)
EGFR: 60 mL/min/{1.73_m2} — ABNORMAL LOW (ref >90)
Glucose: 127 mg/dl (ref 70–140)
POTASSIUM: 4.1 meq/L (ref 3.5–5.1)
Sodium: 135 mEq/L — ABNORMAL LOW (ref 136–145)
Total Bilirubin: 0.46 mg/dL (ref 0.20–1.20)
Total Protein: 7.1 g/dL (ref 6.4–8.3)

## 2016-05-17 LAB — MAGNESIUM: MAGNESIUM: 2.2 mg/dL (ref 1.5–2.5)

## 2016-05-17 MED ORDER — DIPHENOXYLATE-ATROPINE 2.5-0.025 MG PO TABS: 2.0000 | ORAL_TABLET | Freq: Four times a day (QID) | ORAL | Status: AC | PRN

## 2016-05-17 MED ORDER — SODIUM CHLORIDE 0.9 % IV SOLN
INTRAVENOUS | Status: AC
  Administered 2016-05-17: 13:00:00 via INTRAVENOUS

## 2016-05-17 MED ORDER — DRONABINOL 2.5 MG PO CAPS: 2.5000 mg | ORAL_CAPSULE | Freq: Two times a day (BID) | ORAL | Status: DC

## 2016-05-17 NOTE — Telephone Encounter (Signed)
"  I need to talk with [facility] Provider.  I was told to call if I had problems with medication creams.  Have her call me this afternoon at [phone].  The Pharmacy puts medicines back if you do not pick them up within three days and I did not get the medicine they say I did.  I received Triamcinolone 0.5% from my PCP on Monday.  I have not tried it yet because I do not want to mix all this stuff.  I had a medicine I do not know the name of and threw vials away and it worked well as a bandage cream, it was not for itching but helped."  Propionate 0.05 % was also mentioned during this call.

## 2016-05-18 ENCOUNTER — Telehealth: Payer: Self-pay | Admitting: *Deleted

## 2016-05-18 ENCOUNTER — Other Ambulatory Visit: Payer: Self-pay | Admitting: *Deleted

## 2016-05-18 ENCOUNTER — Ambulatory Visit: Payer: Medicare Other | Admitting: Radiation Oncology

## 2016-05-18 NOTE — Telephone Encounter (Signed)
TC to pt to discuss pharmacy and creams. Pt states he would like to pick up Triamcinolone cream but does not know the prescription number. Advised pt to call the pharmacy and speak to a pharm tech that would be able to pull up the information to have this filled again. Pt states he will.  Asked pt about port flushes vs removal. Pt states he wants to keep his port. He understands that he needs to have a flush appt every 6-8 weeks. Pt scheduled for flush appt on 7/14 before follow up with radonc. Pt repeated back appt times.

## 2016-05-21 ENCOUNTER — Encounter: Payer: Self-pay | Admitting: Radiation Oncology

## 2016-05-21 ENCOUNTER — Telehealth: Payer: Self-pay | Admitting: Medical Oncology

## 2016-05-21 ENCOUNTER — Encounter: Payer: Self-pay | Admitting: Nurse Practitioner

## 2016-05-21 DIAGNOSIS — R21 Rash and other nonspecific skin eruption: Secondary | ICD-10-CM | POA: Insufficient documentation

## 2016-05-21 DIAGNOSIS — R630 Anorexia: Secondary | ICD-10-CM | POA: Insufficient documentation

## 2016-05-21 DIAGNOSIS — R197 Diarrhea, unspecified: Secondary | ICD-10-CM | POA: Insufficient documentation

## 2016-05-21 DIAGNOSIS — D696 Thrombocytopenia, unspecified: Secondary | ICD-10-CM | POA: Insufficient documentation

## 2016-05-21 NOTE — Assessment & Plan Note (Signed)
Patient states that he's had an ongoing, progressive rash that initiated to the left lateral shin just below his knee; and now has progressed to his anterior chest, his back, his arms and hands, and some scattered rash to his lower legs as well.  The most significant rashes to his bilateral hands; with the left hand, much greater than the right.  He states that the rash also is pruritic; and he tends to scratch it breaking of the skin.  He has been to multiple physicians for evaluation of this rash; but has not yet been to a dermatologist.  He has been given steroid creams in the past; but she states have been ineffective.  Of note-patient list cortisone/steroid as an allergy; this actually unsure if he is actually allergic to steroids.  Patient states that he was diagnosed with a heart attack after receiving steroid injections into his hip.  He states that he has had no issues with using the steroid creams, however.  Patient has a slowly resolving grouped rash to the left shin area.  Directly below his knee.  He has scattered rash to his chest, back, and his arms and legs.  He has significant rash that is cracked, dry, peeling and opened at some areas on both of his hands-with the left much greater than the right.  There is no evidence of infection.  Long discussion with the patient regarding the use of oral steroids; but will hold on oral steroids for the time being.  Confirmed the patient already has triamcinolone cream prescribed; and he can pick up a refill of his pharmacy today.  Also, will order a dermatology referral to be obtained as soon as possible for further evaluation and management.

## 2016-05-21 NOTE — Assessment & Plan Note (Signed)
Patient completed all of his chemotherapy in January 2017; and completed his final radiation treatment in May 2017.  He is currently undergoing observation only.  Patient's blood counts are stable today; with the exception of thrombocytopenia with a platelet count of 60.  Patient has a Port-A-Cath; but he states has not been flushed since approximately January 2017.  Offered to flush patient's Port-A-Cath today; but patient refused.  Confirmed the patient will have a Port-A-Cath flush.  Appointment scheduled at his next visit.  Patient returns to the [facility] for a radiation oncology, visit, and a Port-A-Cath flush on 05/25/2016.  He is scheduled for labs and flush again on September 1; and will return for follow-up visit with Dr. Julien Nordmann on 07/19/2016.

## 2016-05-21 NOTE — Progress Notes (Signed)
SYMPTOM MANAGEMENT CLINIC    Chief Complaint: Rash, diarrhea  HPI:  Andrew Shaw 70 y.o. male diagnosed with lung cancer.  Patient is status post chemotherapy and radiation treatments.  Currently undergoing observation only.  Patient states that he's had an ongoing, progressive rash that initiated to the left lateral shin just below his knee; and now has progressed to his anterior chest, his back, his arms and hands, and some scattered rash to his lower legs as well.  The most significant rashes to his bilateral hands; with the left hand, much greater than the right.  He states that the rash also is pruritic; and he tends to scratch it breaking of the skin.  He has been to multiple physicians for evaluation of this rash; but has not yet been to a dermatologist.  He has been given steroid creams in the past; but she states have been ineffective.  Of note-patient list cortisone/steroid as an allergy; this actually unsure if he is actually allergic to steroids.  Patient states that he was diagnosed with a heart attack after receiving steroid injections into his hip.  He states that he has had no issues with using the steroid creams, however.  Patient has a slowly resolving grouped rash to the left shin area.  Directly below his knee.  He has scattered rash to his chest, back, and his arms and legs.  He has significant rash that is cracked, dry, peeling and opened at some areas on both of his hands-with the left much greater than the right.  There is no evidence of infection.  Long discussion with the patient regarding the use of oral steroids; but will hold on oral steroids for the time being.  Confirmed the patient already has triamcinolone cream prescribed; and he can pick up a refill of his pharmacy today.  Also, will order a dermatology referral to be obtained as soon as possible for further evaluation and management.   No history exists.    Review of Systems  Constitutional: Positive  for malaise/fatigue.  Gastrointestinal: Positive for diarrhea.  Skin: Positive for itching and rash.  All other systems reviewed and are negative.   Past Medical History  Diagnosis Date  . Coronary artery disease     2D ECHO, 10/31/2010 - EF >55%, normal; NUCLEAR STRESS TEST, 10/23/2010 - perfusion defect in inferior myocardial region, post-stress EF 62%, EKG negative for ischemia  . Hypertension   . Hyperlipemia   . Chronic back pain   . DVT (deep venous thrombosis) ([place])     history 2004 after knee surg  . BPH (benign prostatic hyperplasia)   . Arthritis   . Neuromuscular disorder ([place])   . Carpal tunnel syndrome   . 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 GERD (gastroesophageal reflux disease)   . Myocardial infarction ([place]) 2005    from steroids  . S/P angioplasty with stent, BMS to LCX  12/23/12 12/23/2012  . Back pain 12/23/2012  . Shortness of breath     with exertion  . COPD (chronic obstructive pulmonary disease) ([place])   . Carpal tunnel syndrome, right   . Skin rash 08/22/2015  . Lung cancer [facility])     Past Surgical History  Procedure Laterality Date  . Knee arthroscopy  04,06    left  . Joint replacement Left 2008    lt total knee  . Manipulation knee joint Left 2009    closed lt knee   . Hernia  repair  2008    umb   . Cervical fusion  1999  . Back surgery  2004    lumb fusion  . Epidural block injection      multiple lumbar  . Carpal tunnel release  04/15/2012    Procedure: CARPAL TUNNEL RELEASE;  Surgeon: Wynonia Sours, MD;  Location: [facility];  Service: Orthopedics;  Laterality: Left;  . Trigger finger release  04/15/2012    Procedure: RELEASE TRIGGER FINGER/A-1 PULLEY;  Surgeon: Wynonia Sours, MD;  Location: [place];  Service: Orthopedics;  Laterality: Left;  left thumb and little finger  . Cardiac catheterization  12/23/2012    Mid nondominant AV groove circumflex stented with a 2.5x28m Mini Vision stent resulting in  a reduction of 90% stenosis to 0% residual  . Cardiac catheterization  02/07/2004    Noncritical CAD, continue medical therapy  . Cardiac catheterization  02/03/1999    Recommended medical therapy  . Left heart catheterization with coronary angiogram N/A 12/23/2012    Procedure: LEFT HEART CATHETERIZATION WITH CORONARY ANGIOGRAM;  Surgeon: JLorretta Harp MD;  [facility];  Service: Cardiovascular;  Laterality: N/A;  . Percutaneous coronary stent intervention (pci-s)  12/23/2012    Procedure: PERCUTANEOUS CORONARY STENT INTERVENTION (PCI-S);  Surgeon: JLorretta Harp MD;  [facility];  Service: Cardiovascular;;  . Left heart catheterization with coronary angiogram N/A 01/11/2015    Procedure: LEFT HEART CATHETERIZATION WITH CORONARY ANGIOGRAM;  Surgeon: Peter M JMartinique MD;  [facility];  Service: Cardiovascular;  Laterality: N/A;  . Colonoscopy w/ polypectomy    . Video bronchoscopy with endobronchial ultrasound N/A 07/28/2015    Procedure: VIDEO BRONCHOSCOPY WITH ENDOBRONCHIAL ULTRASOUND;  Surgeon: SMelrose Nakayama MD;  [place] [facility]  Service: Thoracic;  Laterality: N/A;    has CAD (coronary artery disease), cath 2005 with non obstructive disease, now 12/2102 with LCX stenosis culprit vessel; Unstable angina ([place]; Dyslipidemia; HTN (hypertension); Tobacco abuse; S/P angioplasty with stent, BMS to LCX  12/23/12; [facility]; Carpal tunnel syndrome of right wrist, may need surgery in near future; Pain in the chest; Smoking history; Mediastinal adenopathy; Other emphysema ([place]; ILD (interstitial lung disease) ([place]; Small cell carcinoma of lung ([place]; Dehydration; COPD (chronic obstructive pulmonary disease) ([place]; Chest crackles; Diarrhea; Rash; Anorexia; and Thrombocytopenia ([place] on his problem list.    is allergic to cortisone; latex; lisinopril; antihistamines, chlorpheniramine-type; and flexeril.    Medication List         This list is accurate as of: 05/17/16 11:59 PM.  Always use your most recent med list.               aspirin EC 81 MG tablet  Take 81 mg by mouth daily.     diphenoxylate-atropine 2.5-0.025 MG tablet  Commonly known as:  LOMOTIL  Take 2 tablets by mouth 4 (four) times daily as needed for diarrhea or loose stools.     dronabinol 2.5 MG capsule  Commonly known as:  MARINOL  Take 1 capsule (2.5 mg total) by mouth 2 (two) times daily before a meal.     fenofibrate 48 MG tablet  Commonly known as:  TRICOR  Take 48 mg by mouth daily.     HYDROcodone-acetaminophen 10-325 MG tablet  Commonly known as:  NORCO  Take 1 tablet by mouth every 4 (four) hours as needed (breakthrough pain).     irbesartan 150 MG tablet  Commonly  known as:  AVAPRO  TAKE 1 TABLET(150 MG) BY MOUTH DAILY     lidocaine-prilocaine cream  Commonly known as:  EMLA  Apply one application to port a cath 1-2 hours prior to access.     LORazepam 1 MG tablet  Commonly known as:  ATIVAN  Take 1 tablet (1 mg total) by mouth at bedtime.     metoprolol 50 MG tablet  Commonly known as:  LOPRESSOR  Take 1 tablet (50 mg total) by mouth 2 (two) times daily. NEED OV.     metoprolol 50 MG tablet  Commonly known as:  LOPRESSOR  TAKE 1 TABLET BY MOUTH TWICE DAILY     mometasone 0.1 % cream  Commonly known as:  ELOCON  Apply 1 application topically daily as needed (for rash). Reported on 05/17/2016     nitroGLYCERIN 0.4 MG SL tablet  Commonly known as:  NITROSTAT  Place 1 tablet (0.4 mg total) under the tongue every 5 (five) minutes x 3 doses as needed for chest pain.     nystatin 100000 UNIT/ML suspension  Commonly known as:  MYCOSTATIN  TAKE 5 ML BY MOUTH FOUR TIMES DAILY     OXYCONTIN 20 mg 12 hr tablet  Generic drug:  oxyCODONE  Take 20 mg by mouth every 8 (eight) hours.     prochlorperazine 10 MG tablet  Commonly known as:  COMPAZINE  Take 1 tablet (10 mg total) by mouth every 6 (six) hours as needed for  nausea or vomiting.     rosuvastatin 20 MG tablet  Commonly known as:  CRESTOR  Take 20 mg by mouth daily.     sucralfate 1 g tablet  Commonly known as:  CARAFATE  TAKE 1 TABLET BY MOUTH FOUR TIMES DAILY         PHYSICAL EXAMINATION  Oncology Vitals 05/17/2016 05/08/2016  Height 169 cm 169 cm  Weight 79.606 kg 80.513 kg  Weight (lbs) 175 lbs 8 oz 177 lbs 8 oz  BMI (kg/m2) 27.9 kg/m2 28.22 kg/m2  Temp 97.9 -  Pulse 84 86  Resp 17 -  SpO2 100 -  BSA (m2) 1.93 m2 1.94 m2   BP Readings from Last 2 Encounters:  05/17/16 126/62  05/08/16 86/56    Physical Exam  Constitutional: He is oriented to person, place, and time and well-developed, well-nourished, and in no distress.  HENT:  Head: Normocephalic and atraumatic.  Mouth/Throat: Oropharynx is clear and moist.  Eyes: Conjunctivae and EOM are normal. Pupils are equal, round, and reactive to light. Right eye exhibits no discharge. Left eye exhibits no discharge. No scleral icterus.  Neck: Normal range of motion. Neck supple. No JVD present. No tracheal deviation present. No thyromegaly present.  Cardiovascular: Normal rate, regular rhythm, normal heart sounds and intact distal pulses.   Pulmonary/Chest: Effort normal and breath sounds normal. No respiratory distress. He has no wheezes. He has no rales. He exhibits no tenderness.  Abdominal: Soft. Bowel sounds are normal. He exhibits no distension and no mass. There is no tenderness. There is no rebound and no guarding.  Musculoskeletal: Normal range of motion. He exhibits no edema or tenderness.  Lymphadenopathy:    He has no cervical adenopathy.  Neurological: He is alert and oriented to person, place, and time. Gait normal.  Skin: Skin is warm and dry. Rash noted. There is erythema. No pallor.  Patient states that he's had an ongoing, progressive rash that initiated to the left lateral shin just below his knee;  and now has progressed to his anterior chest, his back, his arms and  hands, and some scattered rash to his lower legs as well.  The most significant rashes to his bilateral hands; with the left hand, much greater than the right.  He states that the rash also is pruritic; and he tends to scratch it breaking of the skin.         Psychiatric: Affect normal.  Nursing note and vitals reviewed.   LABORATORY DATA:. Appointment on 05/17/2016  Component Date Value Ref Range Status  . WBC 05/17/2016 2.9* 4.0 - 10.3 10e3/uL Final  . NEUT# 05/17/2016 2.1  1.5 - 6.5 10e3/uL Final  . HGB 05/17/2016 11.7* 13.0 - 17.1 g/dL Final  . HCT 05/17/2016 34.4* 38.4 - 49.9 % Final  . Platelets 05/17/2016 60* 140 - 400 10e3/uL Final  . MCV 05/17/2016 103.6* 79.3 - 98.0 fL Final  . MCH 05/17/2016 35.2* 27.2 - 33.4 pg Final  . MCHC 05/17/2016 34.0  32.0 - 36.0 g/dL Final  . RBC 05/17/2016 3.32* 4.20 - 5.82 10e6/uL Final  . RDW 05/17/2016 13.9  11.0 - 14.6 % Final  . lymph# 05/17/2016 0.4* 0.9 - 3.3 10e3/uL Final  . MONO# 05/17/2016 0.4  0.1 - 0.9 10e3/uL Final  . Eosinophils Absolute 05/17/2016 0.1  0.0 - 0.5 10e3/uL Final  . Basophils Absolute 05/17/2016 0.0  0.0 - 0.1 10e3/uL Final  . NEUT% 05/17/2016 72.6  39.0 - 75.0 % Final  . LYMPH% 05/17/2016 12.5* 14.0 - 49.0 % Final  . MONO% 05/17/2016 12.5  0.0 - 14.0 % Final  . EOS% 05/17/2016 2.4  0.0 - 7.0 % Final  . BASO% 05/17/2016 0.0  0.0 - 2.0 % Final  . Sodium 05/17/2016 135* 136 - 145 mEq/L Final  . Potassium 05/17/2016 4.1  3.5 - 5.1 mEq/L Final  . Chloride 05/17/2016 105  98 - 109 mEq/L Final  . CO2 05/17/2016 20* 22 - 29 mEq/L Final  . Glucose 05/17/2016 127  70 - 140 mg/dl Final   Glucose reference range is for nonfasting patients. Fasting glucose reference range is 70- 100.  Marland Kitchen BUN 05/17/2016 14.5  7.0 - 26.0 mg/dL Final  . Creatinine 05/17/2016 1.2  0.7 - 1.3 mg/dL Final  . Total Bilirubin 05/17/2016 0.46  0.20 - 1.20 mg/dL Final  . Alkaline Phosphatase 05/17/2016 69  40 - 150 U/L Final  . AST 05/17/2016 19  5  - 34 U/L Final  . ALT 05/17/2016 12  0 - 55 U/L Final  . Total Protein 05/17/2016 7.1  6.4 - 8.3 g/dL Final  . Albumin 05/17/2016 3.4* 3.5 - 5.0 g/dL Final  . Calcium 05/17/2016 9.4  8.4 - 10.4 mg/dL Final  . Anion Gap 05/17/2016 10  3 - 11 mEq/L Final  . EGFR 05/17/2016 60* >90 ml/min/1.73 m2 Final   eGFR is calculated using the CKD-EPI Creatinine Equation (2009)  . Magnesium 05/17/2016 2.2  1.5 - 2.5 mg/dl Final   Left shoulder:     Chest:     :eft anterior shin:     Hands:     RADIOGRAPHIC STUDIES: No results found.  ASSESSMENT/PLAN:    Thrombocytopenia ([place]) Patient continues low.  Thrombocytopenia with platelet count improved from 41 up to 60.  Patient continues to experience some increasing issues with bruising; no active bleeding noted.  Will continue to monitor closely.  Small cell carcinoma of lung [facility]) Patient completed all of his chemotherapy in January 2017; and completed his final  radiation treatment in May 2017.  He is currently undergoing observation only.  Patient's blood counts are stable today; with the exception of thrombocytopenia with a platelet count of 60.  Patient has a Port-A-Cath; but he states has not been flushed since approximately January 2017.  Offered to flush patient's Port-A-Cath today; but patient refused.  Confirmed the patient will have a Port-A-Cath flush.  Appointment scheduled at his next visit.  Patient returns to the [facility] for a radiation oncology, visit, and a Port-A-Cath flush on 05/25/2016.  He is scheduled for labs and flush again on September 1; and will return for follow-up visit with Dr. Julien Nordmann on 07/19/2016.  Rash Patient states that he's had an ongoing, progressive rash that initiated to the left lateral shin just below his knee; and now has progressed to his anterior chest, his back, his arms and hands, and some scattered rash to his lower legs as well.  The most significant rashes to his bilateral hands; with  the left hand, much greater than the right.  He states that the rash also is pruritic; and he tends to scratch it breaking of the skin.  He has been to multiple physicians for evaluation of this rash; but has not yet been to a dermatologist.  He has been given steroid creams in the past; but she states have been ineffective.  Of note-patient list cortisone/steroid as an allergy; this actually unsure if he is actually allergic to steroids.  Patient states that he was diagnosed with a heart attack after receiving steroid injections into his hip.  He states that he has had no issues with using the steroid creams, however.  Patient has a slowly resolving grouped rash to the left shin area.  Directly below his knee.  He has scattered rash to his chest, back, and his arms and legs.  He has significant rash that is cracked, dry, peeling and opened at some areas on both of his hands-with the left much greater than the right.  There is no evidence of infection.  Long discussion with the patient regarding the use of oral steroids; but will hold on oral steroids for the time being.  Confirmed the patient already has triamcinolone cream prescribed; and he can pick up a refill of his pharmacy today.  Also, will order a dermatology referral to be obtained as soon as possible for further evaluation and management.    Diarrhea Patient states he has been experiencing some diarrhea recently; has been taken intermittent Imodium.  He states he has 1-2 diarrhea episodes per day on occasion.  He denied any recent use of antibiotics.  Patient was given a stool kit to obtain a stool for C. difficile, rule out.  He was unable to give a stool sample while at the [facility] today; so was given a stool sample kit to take home.  Those results are pending.  Patient was also given a prescription for Lomotil to alternate with the Imodium.  Dehydration Patient did appear mildly dehydrated today.:  Received IV fluid  rehydration while at the cancer center.  Sodium was 135 on labs today  Anorexia Patient's wife mentioned that patient has had much decreased appetite recently; and requested a medication for increasing appetite.  Patient was given a prescription for Marinol to try.   Patient stated understanding of all instructions; and was in agreement with this plan of care. The patient knows to call the clinic with any problems, questions or concerns.   Total time spent  with patient was 40 minutes;  with greater than 75 percent of that time spent in face to face counseling regarding patient's symptoms,  and coordination of care and follow up.  Disclaimer:This dictation was prepared with Dragon/digital dictation along with [employer]. Any transcriptional errors that result from this process are unintentional.  Drue Second, NP 05/21/2016   ADDENDUM: Hematology/Oncology Attending: I had a face to face encounter with the patient. I recommended his care plan. 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 The patient presented today complaining of extensive skin rash on the chest and back: Unclear etiology. We recommended for the patient treatment with steroids but the patient has questionable allergic reaction to steroids. We will start the patient on topical triamcinolone cream. We also made a referral to dermatology for further evaluation. He also complained of frequent diarrhea without any recent treatment with antibiotics. We will Hx the patient for C. difficile and he was advised to take Imodium and Lomotil as needed. For dehydration, the patient received IV fluid at the cancer center today. He would come back for follow-up visit as previously scheduled. I strongly recommended for the patient to call immediately if he  has any other concerning symptoms in the interval.  Disclaimer: This note was dictated with voice recognition software. Similar sounding words can inadvertently be transcribed and may be missed upon review. Eilleen Kempf., MD 05/24/2016

## 2016-05-21 NOTE — Telephone Encounter (Signed)
marinol needs prior auth . Note to Raquel

## 2016-05-21 NOTE — Assessment & Plan Note (Addendum)
Patient states he has been experiencing some diarrhea recently; has been taken intermittent Imodium.  He states he has 1-2 diarrhea episodes per day on occasion.  He denied any recent use of antibiotics.  Patient was given a stool kit to obtain a stool for C. difficile, rule out.  He was unable to give a stool sample while at the [facility] today; so was given a stool sample kit to take home.  Those results are pending.  Patient was also given a prescription for Lomotil to alternate with the Imodium.

## 2016-05-21 NOTE — Assessment & Plan Note (Signed)
Patient's wife mentioned that patient has had much decreased appetite recently; and requested a medication for increasing appetite.  Patient was given a prescription for Marinol to try.

## 2016-05-21 NOTE — Assessment & Plan Note (Signed)
Patient continues low.  Thrombocytopenia with platelet count improved from 41 up to 60.  Patient continues to experience some increasing issues with bruising; no active bleeding noted.  Will continue to monitor closely.

## 2016-05-21 NOTE — Assessment & Plan Note (Signed)
Patient did appear mildly dehydrated today.:  Received IV fluid rehydration while at the cancer center.  Sodium was 135 on labs today

## 2016-05-23 ENCOUNTER — Encounter: Payer: Self-pay | Admitting: Nurse Practitioner

## 2016-05-23 ENCOUNTER — Telehealth: Payer: Self-pay

## 2016-05-23 NOTE — Telephone Encounter (Signed)
Called patient to see if he had heard from scheduling for a dermatology visit. Patient states he has not heard about an appt yet and also inquired about his marinol. Informed patient the marinol was having to be prior authorized and i would call to check on the status of it.    Called scheduling to follow up on dermatology referral. Spoke with [name] who states she will inform scheduling to make sure it gets scheduled.  Called Raquel to follow up on patients Marinol. Left message.

## 2016-05-23 NOTE — Progress Notes (Signed)
[employer] sent form to fill out for prior auth for marinol. Tiffany left mess cking status, I let cyndi know to tell her still waiting on response.

## 2016-05-24 ENCOUNTER — Encounter: Payer: Self-pay | Admitting: Internal Medicine

## 2016-05-24 ENCOUNTER — Telehealth: Payer: Self-pay | Admitting: Nurse Practitioner

## 2016-05-24 ENCOUNTER — Other Ambulatory Visit: Payer: Self-pay | Admitting: Nurse Practitioner

## 2016-05-24 NOTE — Telephone Encounter (Signed)
Called and was able to arrange a dermatology referral.  Appointment with [facility] in [place] [phone]) for Wednesday 06/20/16 at 2:30 pm with Dr. Denna Haggard.   Called and spoke to the patient; and advised him of the new dermatology appointment with Dr. Denna Haggard . Patient stated that he has seen Dr. Allyson Sabal at [facility] in the past; but has not arranged any future appointments for follow-up of his rash.  This provider then attempted on multiple occasions to call Dr. Ledell Peoples office and arrange for an appointment with them.  Was only able to leave a voicemail for call back to Dr. Ledell Peoples office.  This provider then called and spoke to patient's wife, Andrew Shaw; and advised patient's wife of the [facility] appt with Dr. Denna Haggard.  Also, patient's wife stated that she will continue to try reaching [name] to see if she can get an appointment with his dermatologist any sooner.  This provider also advised that they should cancel Dr. Onalee Hua  appointment scheduled for 06/20/2016, at 2:30 PM if they're seen quicker at his regular dermatologist office.  Patient's wife stated understanding of all instructions; and was in agreement with this plan of care.

## 2016-05-24 NOTE — Progress Notes (Signed)
Per [employer] marinol approved 05/23/16-11/11/16  [id number]- I sent to medical recrds

## 2016-05-24 NOTE — Progress Notes (Signed)
sent via covermymeds- I sent form 05/23/16 and let cbacon know-tiffany- per [employer] [id number] marinol approved till 11/11/16

## 2016-05-24 NOTE — Telephone Encounter (Signed)
Received call back from Dr. Ledell Peoples dermatology office.  Was able to obtain an appointment with Dr. Allyson Sabal for this coming Monday, 05/28/2016 at 11:10 AM.  Then-this patient called and canceled the [place] dermatology appointment for the patient was previously scheduled for 06/20/2016.  Then-called both patient and then his wife on her cell phone to inform them of the appointment that was just made with Dr. Allyson Sabal for Monday, 05/28/2016 at 11:10 AM.

## 2016-05-25 ENCOUNTER — Encounter: Payer: Self-pay | Admitting: Radiation Oncology

## 2016-05-25 ENCOUNTER — Ambulatory Visit (HOSPITAL_BASED_OUTPATIENT_CLINIC_OR_DEPARTMENT_OTHER): Payer: Medicare Other

## 2016-05-25 ENCOUNTER — Ambulatory Visit
Admission: RE | Admit: 2016-05-25 | Discharge: 2016-05-25 | Disposition: A | Discharge status: Home / Self Care | Payer: Medicare Other | Source: Ambulatory Visit | Attending: Radiation Oncology | Admitting: Radiation Oncology

## 2016-05-25 VITALS — BP 115/66 | HR 96 | Temp 98.0°F | Ht 66.5 in | Wt 171.2 lb

## 2016-05-25 DIAGNOSIS — Z95828 Presence of other vascular implants and grafts: Secondary | ICD-10-CM | POA: Insufficient documentation

## 2016-05-25 DIAGNOSIS — Z452 Encounter for adjustment and management of vascular access device: Secondary | ICD-10-CM

## 2016-05-25 DIAGNOSIS — R21 Rash and other nonspecific skin eruption: Secondary | ICD-10-CM | POA: Diagnosis not present

## 2016-05-25 DIAGNOSIS — C7931 Secondary malignant neoplasm of brain: Secondary | ICD-10-CM | POA: Insufficient documentation

## 2016-05-25 DIAGNOSIS — C3490 Malignant neoplasm of unspecified part of unspecified bronchus or lung: Secondary | ICD-10-CM | POA: Diagnosis not present

## 2016-05-25 HISTORY — DX: Personal history of irradiation: Z92.3

## 2016-05-25 MED ORDER — HEPARIN SOD (PORK) LOCK FLUSH 100 UNIT/ML IV SOLN
500.0000 [IU] | Freq: Once | INTRAVENOUS | Status: AC | PRN
  Administered 2016-05-25: 500 [IU] via INTRAVENOUS
  Filled 2016-05-25: qty 5

## 2016-05-25 MED ORDER — MAGIC MOUTHWASH W/LIDOCAINE: ORAL | Status: DC

## 2016-05-25 MED ORDER — SODIUM CHLORIDE 0.9 % IJ SOLN
10.0000 mL | INTRAMUSCULAR | Status: DC | PRN
  Administered 2016-05-25: 10 mL via INTRAVENOUS
  Filled 2016-05-25: qty 10

## 2016-05-25 NOTE — Progress Notes (Signed)
Radiation Oncology         ([phone]) [phone] ________________________________  Name: Andrew Shaw MRN: [id number]  Date: 05/25/2016  DOB: 04-26-1946  Follow-Up Visit Note  Outpatient  CC: [name], MD  [name], *  Diagnosis and Prior Radiotherapy:    ICD-9-CM ICD-10-CM   1. 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   Narrative:  The patient returns today for routine follow-up. CT on 04/04/16 showed improved subcarinal and right hilar adenopathy. He had no additional evidence of metastatic disease in the chest/abd/pelvis. The patient saw Dr. Julien Nordmann on 04/16/16. The patient is not currently receiving chemotherapy.  The patient has diarrhea and a skin rash to his legs, hands, left neck area, and back. He has been evaluated by Selena Lesser, NP and Dr. Earlie Server and has an appointment on Monday 05/28/16 with a Dermatologist. He reports his diarrhea has improved with the use of Lomotil. The skin to his radiation sites are dry, but improving. He denies nausea. He does have a cough with some mucous production. He reports weakness. He remains active by working in his yard. The patient has back pain attributed to spinal stenosis. He is not eating well, he reports no taste, and has lost weight recently. The patient states his mouth sometimes get sore and noticed this after radiation. He has lost 20 lbs since April. He denies difficulty swallowing.  ALLERGIES:  is allergic to cortisone; latex; lisinopril; antihistamines, chlorpheniramine-type; and flexeril.  Meds: Current Outpatient Prescriptions  Medication Sig Dispense Refill  .  aspirin EC 81 MG tablet Take 81 mg by mouth daily.    . diphenoxylate-atropine (LOMOTIL) 2.5-0.025 MG tablet Take 2 tablets by mouth 4 (four) times daily as needed for diarrhea or loose stools. 30 tablet 0  . fenofibrate (TRICOR) 48 MG tablet Take 48 mg by mouth daily.    Marland Kitchen HYDROcodone-acetaminophen (NORCO) 10-325 MG per tablet Take 1 tablet by mouth every 4 (four) hours as needed (breakthrough pain).     Marland Kitchen lidocaine-prilocaine (EMLA) cream Apply one application to port a cath 1-2 hours prior to access. 30 g 2  . metoprolol (LOPRESSOR) 50 MG tablet TAKE 1 TABLET BY MOUTH TWICE DAILY 60 tablet 0  . mometasone (ELOCON) 0.1 % cream Apply 1 application topically daily as needed (for rash). Reported on 05/25/2016  3  . nystatin (MYCOSTATIN) 100000 UNIT/ML suspension TAKE 5 ML BY MOUTH FOUR TIMES DAILY 60 mL 0  . OXYCONTIN 20 MG T12A 12 hr tablet Take 20 mg by mouth every 8 (eight) hours.  0  . prochlorperazine (COMPAZINE) 10 MG tablet Take 1 tablet (10 mg total) by mouth every 6 (six) hours as needed for nausea or vomiting. 30 tablet 1  . rosuvastatin (CRESTOR) 20 MG tablet Take 20 mg by mouth daily.    Marland Kitchen dronabinol (MARINOL) 2.5 MG capsule Take 1 capsule (2.5 mg total) by mouth 2 (two) times daily before a meal. (Patient not taking: Reported on 05/25/2016) 30 capsule 0  . irbesartan (AVAPRO) 150 MG tablet TAKE 1 TABLET(150 MG) BY MOUTH DAILY (Patient not taking: Reported  on 05/17/2016) 30 tablet 0  . LORazepam (ATIVAN) 1 MG tablet Take 1 tablet (1 mg total) by mouth at bedtime. (Patient not taking: Reported on 05/17/2016) 30 tablet 1  . magic mouthwash w/lidocaine SOLN 1part nystatin,1part Maalox plus cherry,1part benadryl, 3 parts 2%viscous lidocaine. Swish for 60 sec and then swallow up to QID, PRN 100 mL 5  . nitroGLYCERIN (NITROSTAT) 0.4 MG SL tablet Place 1 tablet (0.4 mg total) under the tongue every 5 (five) minutes x 3 doses as needed for chest pain. (Patient not taking: Reported on 05/17/2016) 25 tablet  2  . sucralfate (CARAFATE) 1 g tablet TAKE 1 TABLET BY MOUTH FOUR TIMES DAILY (Patient not taking: Reported on 05/17/2016) 360 tablet 0  . triamcinolone cream (KENALOG) 0.5 % Reported on 05/25/2016  1   No current facility-administered medications for this encounter.    Physical Findings: The patient is in no acute distress. Patient is alert and oriented.  height is 5' 6.5" (1.689 m) and weight is 171 lb 3.2 oz (77.656 kg). His temperature is 98 F (36.7 C). His blood pressure is 115/66 and his pulse is 96. His oxygen saturation is 98%. .    No oral thrush but mouth is erythematous. Skin over his scalp is slightly dry. He has skin rashes on his hands that are erythematous and dry. Lungs are clear to auscultation bilaterally. Heart has regular rate and rhythm. The patient's left leg is noted for a rash beneath the knee.  Lab Findings: Lab Results  Component Value Date   WBC 2.9* 05/17/2016   HGB 11.7* 05/17/2016   HCT 34.4* 05/17/2016   MCV 103.6* 05/17/2016   PLT 60* 05/17/2016    Radiographic Findings: No results found.  Impression/Plan: recovering from RT. The patient is scheduled for a repeat body CT scan in 2 months per Dr. Earlie Server. I will order a new baseline brain MRI in 3 months and he will follow up with me soon after that. The patient asked for a prescription of Magic Mouthwash as he noticed it helped with mouth sores when he used it PRN. I provided a prescription of Magic Mouthwash w/ Lidocaine Solution. I advised the patient or his wife to call the cancer center if he notices sores or white patches in his mouth.  _____________________________________   Eppie Gibson, MD  This document serves as a record of services personally performed by Eppie Gibson, MD. It was created on her behalf by Darcus Austin, a trained medical scribe. The creation of this record is based on the scribe's personal observations and the provider's statements to them. This document has been checked and  approved by the attending provider.

## 2016-05-25 NOTE — Patient Instructions (Signed)

## 2016-05-25 NOTE — Progress Notes (Signed)
Andrew Shaw presents for follow up of radiation completed 03/21/16 to his Whole brain and right hilum. He is here with complaints of diarrhea and a skin rash to his legs, hands, Left neck area and back. He has been evaluated by Selena Lesser NP and Dr. Earlie Server, and has an appointment on Monday 05/28/16 with a Dermatologist. He reports his diarrhea has improved with the use of Lomotil. The skin to his radiation sites are dry, but improving. He denies nausea, and pain. He does have a cough with some mucous production. He reports weakness. He is not eating well, he reports no taste, and has lost weight recently. He denies difficulty swallowing.   BP 115/66 mmHg  Pulse 96  Temp(Src) 98 F (36.7 C)  Ht 5' 6.5" (1.689 m)  Wt 171 lb 3.2 oz (77.656 kg)  BMI 27.22 kg/m2  SpO2 98%   Wt Readings from Last 3 Encounters:  05/25/16 171 lb 3.2 oz (77.656 kg)  05/17/16 175 lb 8 oz (79.606 kg)  05/08/16 177 lb 8 oz (80.513 kg)

## 2016-05-28 DIAGNOSIS — L309 Dermatitis, unspecified: Secondary | ICD-10-CM | POA: Diagnosis not present

## 2016-06-04 DIAGNOSIS — M4726 Other spondylosis with radiculopathy, lumbar region: Secondary | ICD-10-CM | POA: Diagnosis not present

## 2016-06-04 DIAGNOSIS — M47812 Spondylosis without myelopathy or radiculopathy, cervical region: Secondary | ICD-10-CM | POA: Diagnosis not present

## 2016-06-04 DIAGNOSIS — Z79891 Long term (current) use of opiate analgesic: Secondary | ICD-10-CM | POA: Diagnosis not present

## 2016-06-04 DIAGNOSIS — G894 Chronic pain syndrome: Secondary | ICD-10-CM | POA: Diagnosis not present

## 2016-06-19 ENCOUNTER — Other Ambulatory Visit: Payer: Self-pay | Admitting: Cardiovascular Disease

## 2016-06-19 NOTE — Telephone Encounter (Signed)
Rx(s) sent to pharmacy electronically.  

## 2016-06-27 ENCOUNTER — Other Ambulatory Visit: Payer: Self-pay | Admitting: Radiation Therapy

## 2016-06-27 ENCOUNTER — Encounter: Payer: Self-pay | Admitting: Radiation Therapy

## 2016-06-27 ENCOUNTER — Telehealth: Payer: Self-pay | Admitting: Medical Oncology

## 2016-06-27 DIAGNOSIS — C7949 Secondary malignant neoplasm of other parts of nervous system: Principal | ICD-10-CM

## 2016-06-27 DIAGNOSIS — C7931 Secondary malignant neoplasm of brain: Secondary | ICD-10-CM

## 2016-06-27 NOTE — Telephone Encounter (Signed)
Scan not scheduled yet -asking shy. Note to [place].

## 2016-06-27 NOTE — Progress Notes (Signed)
1.  Do you need a wheel chair? no     2. On oxygen? no  3. Have you ever had any surgery in the body part being scanned?  no  4. Have you ever had any surgery on your brain or heart? Heart Cath with stents placed on 12/23/2012 by Dr. Gwenlyn Found - OP notes are in Epic                             5. Have you ever had surgery on your eyes or ears?     no                                          6. Do you have a pacemaker or defibrillator?  no     7. Do you have a Neurostimulator?    no  9. Any risk for metal in eyes?  no  10. Injury by bullet, buckshot, or shrapnel?  no  11. Stent? Cardiac Stent placed on 12/23/12 by Dr. Gwenlyn Found - Note in [place].  12. Hx of Cancer?    Yes- Small cell lung Cancer with brain mets                                                                                                              13. Kidney or Liver disease?  no  14. Hx of Lupus, Rheumatoid Arthritis or Scleroderma?  no  15. IV Antibiotics or long term use of NSAIDS?  no  16. HX of Hypertension?  yes  17. Diabetes?  no  18. Allergy to contrast?  no  19. Recent labs. Will need labs   Questions answered by Pt over the phone on 06/27/16  Mont Dutton R.T.(R)IT) Special Procedures Navigator   - I sent a detailed e-mail to Remo Lipps at GI requesting she check the stent for compatibility on the 3T magnet and will schedule after I hear back from her.

## 2016-06-28 ENCOUNTER — Telehealth: Payer: Self-pay | Admitting: Cardiovascular Disease

## 2016-06-28 NOTE — Telephone Encounter (Signed)
Returned call after chart review. Pt has hx of brain mets, he is to undergo MRI at 3 Tesla rating. Since patient has history of stent placement, [facility] unable to schedule MRI until approved by cardiology.  Pt had Mini Vision bare metal stent placed in 2014 by Dr. Gwenlyn Found. Routed for review by provider. If OK will furnish letter w/ guidance to fax number provided.

## 2016-06-28 NOTE — Telephone Encounter (Signed)
New message    Office calling wants to know what type of stent does the patient has if compatible to have  3T-MRI.

## 2016-06-29 ENCOUNTER — Other Ambulatory Visit: Payer: Self-pay | Admitting: Medical Oncology

## 2016-06-29 ENCOUNTER — Encounter: Payer: Self-pay | Admitting: *Deleted

## 2016-06-29 ENCOUNTER — Telehealth: Payer: Self-pay | Admitting: Medical Oncology

## 2016-06-29 NOTE — Telephone Encounter (Signed)
MRI improved at 3 T rating

## 2016-06-29 NOTE — Telephone Encounter (Signed)
Told wife to expect a call from scheduling for CT scan.

## 2016-06-29 NOTE — Telephone Encounter (Signed)
Letter compiled and sent.

## 2016-06-29 NOTE — Progress Notes (Signed)
Letter sent to [facility] Schwartz for purposes of MRI scheduling.

## 2016-07-02 DIAGNOSIS — G894 Chronic pain syndrome: Secondary | ICD-10-CM | POA: Diagnosis not present

## 2016-07-02 DIAGNOSIS — M4726 Other spondylosis with radiculopathy, lumbar region: Secondary | ICD-10-CM | POA: Diagnosis not present

## 2016-07-02 DIAGNOSIS — Z79891 Long term (current) use of opiate analgesic: Secondary | ICD-10-CM | POA: Diagnosis not present

## 2016-07-02 DIAGNOSIS — M47812 Spondylosis without myelopathy or radiculopathy, cervical region: Secondary | ICD-10-CM | POA: Diagnosis not present

## 2016-07-13 ENCOUNTER — Ambulatory Visit (HOSPITAL_COMMUNITY)
Admission: RE | Admit: 2016-07-13 | Discharge: 2016-07-13 | Disposition: A | Discharge status: Home / Self Care | Payer: Medicare Other | Source: Ambulatory Visit | Attending: Internal Medicine | Admitting: Internal Medicine

## 2016-07-13 ENCOUNTER — Other Ambulatory Visit (HOSPITAL_BASED_OUTPATIENT_CLINIC_OR_DEPARTMENT_OTHER): Payer: Medicare Other

## 2016-07-13 ENCOUNTER — Ambulatory Visit (HOSPITAL_BASED_OUTPATIENT_CLINIC_OR_DEPARTMENT_OTHER): Payer: Medicare Other

## 2016-07-13 ENCOUNTER — Encounter (HOSPITAL_COMMUNITY): Payer: Self-pay

## 2016-07-13 DIAGNOSIS — I700 Atherosclerosis of aorta: Secondary | ICD-10-CM | POA: Diagnosis not present

## 2016-07-13 DIAGNOSIS — Z95828 Presence of other vascular implants and grafts: Secondary | ICD-10-CM

## 2016-07-13 DIAGNOSIS — R590 Localized enlarged lymph nodes: Secondary | ICD-10-CM | POA: Insufficient documentation

## 2016-07-13 DIAGNOSIS — C3490 Malignant neoplasm of unspecified part of unspecified bronchus or lung: Secondary | ICD-10-CM

## 2016-07-13 DIAGNOSIS — C3491 Malignant neoplasm of unspecified part of right bronchus or lung: Secondary | ICD-10-CM

## 2016-07-13 DIAGNOSIS — I2510 Atherosclerotic heart disease of native coronary artery without angina pectoris: Secondary | ICD-10-CM | POA: Insufficient documentation

## 2016-07-13 LAB — COMPREHENSIVE METABOLIC PANEL
ALT: 12 U/L (ref 0–55)
AST: 34 U/L (ref 5–34)
Albumin: 3.4 g/dL — ABNORMAL LOW (ref 3.5–5.0)
Alkaline Phosphatase: 65 U/L (ref 40–150)
Anion Gap: 11 mEq/L (ref 3–11)
BUN: 10.7 mg/dL (ref 7.0–26.0)
CALCIUM: 9.5 mg/dL (ref 8.4–10.4)
CHLORIDE: 106 meq/L (ref 98–109)
CO2: 21 mEq/L — ABNORMAL LOW (ref 22–29)
CREATININE: 1 mg/dL (ref 0.7–1.3)
EGFR: 79 mL/min/{1.73_m2} — ABNORMAL LOW (ref >90)
GLUCOSE: 99 mg/dL (ref 70–140)
Potassium: 4.2 mEq/L (ref 3.5–5.1)
Sodium: 138 mEq/L (ref 136–145)
Total Bilirubin: 0.47 mg/dL (ref 0.20–1.20)
Total Protein: 6.6 g/dL (ref 6.4–8.3)

## 2016-07-13 LAB — CBC WITH DIFFERENTIAL/PLATELET
BASO%: 0.3 % (ref 0.0–2.0)
Basophils Absolute: 0 10*3/uL (ref 0.0–0.1)
EOS%: 2.4 % (ref 0.0–7.0)
Eosinophils Absolute: 0.1 10*3/uL (ref 0.0–0.5)
HEMATOCRIT: 34.2 % — AB (ref 38.4–49.9)
HEMOGLOBIN: 11.4 g/dL — AB (ref 13.0–17.1)
LYMPH#: 0.5 10*3/uL — AB (ref 0.9–3.3)
LYMPH%: 10.7 % — ABNORMAL LOW (ref 14.0–49.0)
MCH: 34.7 pg — ABNORMAL HIGH (ref 27.2–33.4)
MCHC: 33.4 g/dL (ref 32.0–36.0)
MCV: 103.8 fL — ABNORMAL HIGH (ref 79.3–98.0)
MONO#: 0.7 10*3/uL (ref 0.1–0.9)
MONO%: 15.2 % — ABNORMAL HIGH (ref 0.0–14.0)
NEUT#: 3.2 10*3/uL (ref 1.5–6.5)
NEUT%: 71.4 % (ref 39.0–75.0)
Platelets: 123 10*3/uL — ABNORMAL LOW (ref 140–400)
RBC: 3.29 10*6/uL — ABNORMAL LOW (ref 4.20–5.82)
RDW: 14.8 % — AB (ref 11.0–14.6)
WBC: 4.5 10*3/uL (ref 4.0–10.3)

## 2016-07-13 MED ORDER — IOPAMIDOL (ISOVUE-300) INJECTION 61%
100.0000 mL | Freq: Once | INTRAVENOUS | Status: AC | PRN
  Administered 2016-07-13: 100 mL via INTRAVENOUS

## 2016-07-13 MED ORDER — SODIUM CHLORIDE 0.9 % IJ SOLN
10.0000 mL | INTRAMUSCULAR | Status: DC | PRN
  Administered 2016-07-13: 10 mL via INTRAVENOUS
  Filled 2016-07-13: qty 10

## 2016-07-13 NOTE — Patient Instructions (Signed)

## 2016-07-19 ENCOUNTER — Ambulatory Visit (HOSPITAL_BASED_OUTPATIENT_CLINIC_OR_DEPARTMENT_OTHER): Payer: Medicare Other | Admitting: Internal Medicine

## 2016-07-19 ENCOUNTER — Encounter: Payer: Self-pay | Admitting: Internal Medicine

## 2016-07-19 VITALS — BP 125/64 | HR 85 | Temp 97.5°F | Resp 16 | Ht 66.5 in | Wt 157.9 lb

## 2016-07-19 DIAGNOSIS — C7931 Secondary malignant neoplasm of brain: Secondary | ICD-10-CM | POA: Diagnosis not present

## 2016-07-19 DIAGNOSIS — R634 Abnormal weight loss: Secondary | ICD-10-CM

## 2016-07-19 DIAGNOSIS — D61818 Other pancytopenia: Secondary | ICD-10-CM | POA: Diagnosis not present

## 2016-07-19 DIAGNOSIS — C3491 Malignant neoplasm of unspecified part of right bronchus or lung: Secondary | ICD-10-CM

## 2016-07-19 NOTE — Progress Notes (Signed)
[place] [phone]   [phone]  OFFICE PROGRESS NOTE  Andrew Shaw, [address] [place] [address]  DIAGNOSIS: Extensive stage (TX, N2, M1b) small cell lung cancer in September 2016 presenting with right hilar and mediastinal lymphadenopathy as well as highly suspicious metastatic disease in the axilla bilaterally.   PRIOR THERAPY:  1) Systemic chemotherapy with carboplatin for AUC of 5 on day 1 and etoposide 120 MG/M2 on days one 2, 3 with Neulasta support on day 4. Status post 4 cycles. Last dose was given 11/02/2015 discontinued secondary to intolerance. 2) whole brain irradiation to multiple metastatic brain lesions under the care of Dr. Pablo Ledger completed 03/21/2016.  CURRENT THERAPY: Observation.  INTERVAL HISTORY: Andrew Shaw 70 y.o. male returns to the clinic today for follow-up visit accompanied by his wife and daughter. The patient is feeling fine today with no specific complaints. He lost around 18 pounds since his last visit secondary to lack of appetite and concern about his disease. He has no significant chest pain, shortness of breath, cough or hemoptysis. He continues to have occasional odynophagia secondary to radiation induced esophagitis. He has no nausea, vomiting, diarrhea or constipation. The patient had repeat CT scan of the chest, abdomen and pelvis performed recently and he is here for evaluation and discussion of his scan results.  MEDICAL HISTORY: Past Medical History:  Diagnosis Date  . Arthritis   . Back pain 12/23/2012  . BPH (benign prostatic hyperplasia)   . Carpal tunnel syndrome   . Carpal tunnel syndrome, right   . Chronic back pain   . COPD (chronic obstructive pulmonary disease) ([place])   . Coronary artery disease    2D ECHO, 10/31/2010 - EF >55%, normal; NUCLEAR STRESS TEST, 10/23/2010 - perfusion defect in inferior myocardial region, post-stress EF 62%, EKG negative for  ischemia  . DVT (deep venous thrombosis) ([place])    history 2004 after knee surg  . GERD (gastroesophageal reflux disease)   . Hx of radiation therapy 03/06/16- 03/21/2016   Whole Brain and Right [place]  . Hyperlipemia   . Hypertension   . Lung cancer ([place])   . Myocardial infarction ([place]) 2005   from steroids  . Neuromuscular disorder ([place])   . 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 S/P angioplasty with stent, BMS to LCX  12/23/12 12/23/2012  . Shortness of breath    with exertion  . Skin rash 08/22/2015    ALLERGIES:  is allergic to cortisone; latex; lisinopril; antihistamines, chlorpheniramine-type; and flexeril [cyclobenzaprine].  MEDICATIONS:  Current Outpatient Prescriptions  Medication Sig Dispense Refill  . aspirin EC 81 MG tablet Take 81 mg by mouth daily.    . diphenoxylate-atropine (LOMOTIL) 2.5-0.025 MG tablet Take 2 tablets by mouth 4 (four) times daily as needed for diarrhea or loose stools. 30 tablet 0  . dronabinol (MARINOL) 2.5 MG capsule Take 1 capsule (2.5 mg total) by mouth 2 (two) times daily before a meal. (Patient not taking: Reported on 05/25/2016) 30 capsule 0  . fenofibrate (TRICOR) 48 MG tablet Take 48 mg by mouth daily.    Marland Kitchen HYDROcodone-acetaminophen (NORCO) 10-325 MG per tablet Take 1 tablet by mouth every 4 (four) hours as needed (breakthrough pain).     . irbesartan (AVAPRO) 150 MG tablet TAKE 1 TABLET(150 MG) BY MOUTH DAILY (Patient not taking: Reported on 05/17/2016) 30 tablet 0  . lidocaine-prilocaine (EMLA) cream Apply one application to  port a cath 1-2 hours prior to access. 30 g 2  . LORazepam (ATIVAN) 1 MG tablet Take 1 tablet (1 mg total) by mouth at bedtime. (Patient not taking: Reported on 05/17/2016) 30 tablet 1  . magic mouthwash w/lidocaine SOLN 1part nystatin,1part Maalox plus cherry,1part benadryl, 3 parts 2%viscous lidocaine. Swish for 60 sec and then swallow up to QID, PRN 100 mL 5  . metoprolol (LOPRESSOR) 50 MG tablet Take 1 tablet (50 mg  total) by mouth 2 (two) times daily. PLEASE CONTACT OFFICE FOR ADDITIONAL REFILLS 30 tablet 0  . mometasone (ELOCON) 0.1 % cream Apply 1 application topically daily as needed (for rash). Reported on 05/25/2016  3  . nitroGLYCERIN (NITROSTAT) 0.4 MG SL tablet Place 1 tablet (0.4 mg total) under the tongue every 5 (five) minutes x 3 doses as needed for chest pain. (Patient not taking: Reported on 05/17/2016) 25 tablet 2  . nystatin (MYCOSTATIN) 100000 UNIT/ML suspension TAKE 5 ML BY MOUTH FOUR TIMES DAILY 60 mL 0  . OXYCONTIN 20 MG T12A 12 hr tablet Take 20 mg by mouth every 8 (eight) hours.  0  . prochlorperazine (COMPAZINE) 10 MG tablet Take 1 tablet (10 mg total) by mouth every 6 (six) hours as needed for nausea or vomiting. 30 tablet 1  . rosuvastatin (CRESTOR) 20 MG tablet Take 20 mg by mouth daily.    . sucralfate (CARAFATE) 1 g tablet TAKE 1 TABLET BY MOUTH FOUR TIMES DAILY (Patient not taking: Reported on 05/17/2016) 360 tablet 0  . triamcinolone cream (KENALOG) 0.5 % Reported on 05/25/2016  1   No current facility-administered medications for this visit.     SURGICAL HISTORY:  Past Surgical History:  Procedure Laterality Date  . BACK SURGERY  2004   lumb fusion  . CARDIAC CATHETERIZATION  12/23/2012   Mid nondominant AV groove circumflex stented with a 2.5x52m Mini Vision stent resulting in a reduction of 90% stenosis to 0% residual  . CARDIAC CATHETERIZATION  02/07/2004   Noncritical CAD, continue medical therapy  . CARDIAC CATHETERIZATION  02/03/1999   Recommended medical therapy  . CARPAL TUNNEL RELEASE  04/15/2012   Procedure: CARPAL TUNNEL RELEASE;  Surgeon: GWynonia Sours MD;  [facility]  Service: Orthopedics;  Laterality: Left;  . CERVICAL FUSION  1999  . COLONOSCOPY W/ POLYPECTOMY    . EPIDURAL BLOCK INJECTION     multiple lumbar  . HERNIA REPAIR  2008   umb   . JOINT REPLACEMENT Left 2008   lt total knee  . KNEE ARTHROSCOPY  04,06   left  . LEFT  HEART CATHETERIZATION WITH CORONARY ANGIOGRAM N/A 12/23/2012   Procedure: LEFT HEART CATHETERIZATION WITH CORONARY ANGIOGRAM;  Surgeon: JLorretta Harp MD;  [facility];  Service: Cardiovascular;  Laterality: N/A;  . LEFT HEART CATHETERIZATION WITH CORONARY ANGIOGRAM N/A 01/11/2015   Procedure: LEFT HEART CATHETERIZATION WITH CORONARY ANGIOGRAM;  Surgeon: Peter M JMartinique MD;  [facility];  Service: Cardiovascular;  Laterality: N/A;  . MANIPULATION KNEE JOINT Left 2009   closed lt knee   . PERCUTANEOUS CORONARY STENT INTERVENTION (PCI-S)  12/23/2012   Procedure: PERCUTANEOUS CORONARY STENT INTERVENTION (PCI-S);  Surgeon: JLorretta Harp MD;  [facility];  Service: Cardiovascular;;  . TRIGGER FINGER RELEASE  04/15/2012   Procedure: RELEASE TRIGGER FINGER/A-1 PULLEY;  Surgeon: GWynonia Sours MD;  [facility]  Service: Orthopedics;  Laterality: Left;  left thumb and little finger  .  VIDEO BRONCHOSCOPY WITH ENDOBRONCHIAL ULTRASOUND N/A 07/28/2015   Procedure: VIDEO BRONCHOSCOPY WITH ENDOBRONCHIAL ULTRASOUND;  Surgeon: [name], MD;  Location: [facility];  Service: Thoracic;  Laterality: N/A;    REVIEW OF SYSTEMS:  A comprehensive review of systems was negative except for: Constitutional: positive for fatigue and weight loss Gastrointestinal: positive for odynophagia   PHYSICAL EXAMINATION: General appearance: alert, cooperative, fatigued and no distress Head: Normocephalic, without obvious abnormality, atraumatic Neck: no adenopathy, no JVD, supple, symmetrical, trachea midline and thyroid not enlarged, symmetric, no tenderness/mass/nodules Lymph nodes: Cervical, supraclavicular, and axillary nodes normal. Resp: clear to auscultation bilaterally Back: symmetric, no curvature. ROM normal. No CVA tenderness. Cardio: regular rate and rhythm, S1, S2 normal, no murmur, click, rub or gallop GI: soft, non-tender; bowel sounds normal; no masses,   no organomegaly Extremities: extremities normal, atraumatic, no cyanosis or edema Neurologic: Alert and oriented X 3, normal strength and tone. Normal symmetric reflexes. Normal coordination and gait    ECOG PERFORMANCE STATUS: 1 - Symptomatic but completely ambulatory  Blood pressure 125/64, pulse 85, temperature 97.5 F (36.4 C), temperature source Oral, resp. rate 16, height 5' 6.5" (1.689 m), weight 157 lb 14.4 oz (71.6 kg), SpO2 100 %.  LABORATORY DATA: Lab Results  Component Value Date   WBC 4.5 07/13/2016   HGB 11.4 (L) 07/13/2016   HCT 34.2 (L) 07/13/2016   MCV 103.8 (H) 07/13/2016   PLT 123 (L) 07/13/2016      Chemistry      Component Value Date/Time   NA 138 07/13/2016 0812   K 4.2 07/13/2016 0812   CL 102 11/16/2015 0457   CO2 21 (L) 07/13/2016 0812   BUN 10.7 07/13/2016 0812   CREATININE 1.0 07/13/2016 0812      Component Value Date/Time   CALCIUM 9.5 07/13/2016 0812   ALKPHOS 65 07/13/2016 0812   AST 34 07/13/2016 0812   ALT 12 07/13/2016 0812   BILITOT 0.47 07/13/2016 0812       RADIOGRAPHIC STUDIES: Ct Chest W Contrast  Result Date: 07/13/2016 CLINICAL DATA:  History of lung cancer. EXAM: CT CHEST, ABDOMEN, AND PELVIS WITH CONTRAST TECHNIQUE: Multidetector CT imaging of the chest, abdomen and pelvis was performed following the standard protocol during bolus administration of intravenous contrast. CONTRAST:  163m ISOVUE-300 IOPAMIDOL (ISOVUE-300) INJECTION 61% COMPARISON:  04/04/2016 FINDINGS: CT CHEST FINDINGS Mediastinum/Lymph Nodes: The heart size appears normal. No pericardial effusion. Aortic atherosclerosis is noted. Calcification within the RCA, LAD and left circumflex Coronary artery noted. The trachea appears patent and is midline. Normal appearance of the esophagus. The index sub- carinal lymph node measures 6 mm, image 32 series 2. Previously 1.1 cm. No new or enlarging mediastinal or hilar lymph nodes. 6 mm right hilar node is stable from previous  exam. Lungs/Pleura: No pleural effusion. Diffuse bronchial wall thickening is identified. Areas of airway opacification within the posterior right lower lobe identified likely reflecting mucous plugging. Similar to previous exam. No suspicious nodule or mass identified within the lungs. Musculoskeletal: The bones appear osteopenic. No aggressive lytic or sclerotic bone lesions.No chest wall mass or suspicious bone lesions identified. CT ABDOMEN PELVIS FINDINGS Hepatobiliary: No masses or other significant abnormality. Pancreas: No mass, inflammatory changes, or other significant abnormality. Spleen: The spleen appears normal. Adrenals/Urinary Tract: Normal appearance of the adrenal glands. Tiny cyst is noted within the upper pole of left kidney. This is unchanged. No mass or hydronephrosis identified. The urinary bladder appears normal. Stomach/Bowel: No evidence of obstruction, inflammatory process, or  abnormal fluid collections. Vascular/Lymphatic: Aortic atherosclerosis identified. No aneurysm. No enlarged retroperitoneal or mesenteric adenopathy. No enlarged pelvic or inguinal lymph nodes. Reproductive: Prostate gland appears enlarged. Other: None. Musculoskeletal:  No suspicious bone lesions identified. IMPRESSION: 1. Continued improvement in sub- carinal and right hilar adenopathy. No new foci of metastatic disease identified. 2. Aortic atherosclerosis and Coronary artery calcifications. Electronically Signed   By: Kerby Moors M.D.   On: 07/13/2016 11:10   Ct Abdomen Pelvis W Contrast  Result Date: 07/13/2016 CLINICAL DATA:  History of lung cancer. EXAM: CT CHEST, ABDOMEN, AND PELVIS WITH CONTRAST TECHNIQUE: Multidetector CT imaging of the chest, abdomen and pelvis was performed following the standard protocol during bolus administration of intravenous contrast. CONTRAST:  160m ISOVUE-300 IOPAMIDOL (ISOVUE-300) INJECTION 61% COMPARISON:  04/04/2016 FINDINGS: CT CHEST FINDINGS Mediastinum/Lymph Nodes:  The heart size appears normal. No pericardial effusion. Aortic atherosclerosis is noted. Calcification within the RCA, LAD and left circumflex Coronary artery noted. The trachea appears patent and is midline. Normal appearance of the esophagus. The index sub- carinal lymph node measures 6 mm, image 32 series 2. Previously 1.1 cm. No new or enlarging mediastinal or hilar lymph nodes. 6 mm right hilar node is stable from previous exam. Lungs/Pleura: No pleural effusion. Diffuse bronchial wall thickening is identified. Areas of airway opacification within the posterior right lower lobe identified likely reflecting mucous plugging. Similar to previous exam. No suspicious nodule or mass identified within the lungs. Musculoskeletal: The bones appear osteopenic. No aggressive lytic or sclerotic bone lesions.No chest wall mass or suspicious bone lesions identified. CT ABDOMEN PELVIS FINDINGS Hepatobiliary: No masses or other significant abnormality. Pancreas: No mass, inflammatory changes, or other significant abnormality. Spleen: The spleen appears normal. Adrenals/Urinary Tract: Normal appearance of the adrenal glands. Tiny cyst is noted within the upper pole of left kidney. This is unchanged. No mass or hydronephrosis identified. The urinary bladder appears normal. Stomach/Bowel: No evidence of obstruction, inflammatory process, or abnormal fluid collections. Vascular/Lymphatic: Aortic atherosclerosis identified. No aneurysm. No enlarged retroperitoneal or mesenteric adenopathy. No enlarged pelvic or inguinal lymph nodes. Reproductive: Prostate gland appears enlarged. Other: None. Musculoskeletal:  No suspicious bone lesions identified. IMPRESSION: 1. Continued improvement in sub- carinal and right hilar adenopathy. No new foci of metastatic disease identified. 2. Aortic atherosclerosis and Coronary artery calcifications. Electronically Signed   By: TKerby MoorsM.D.   On: 07/13/2016 11:10    ASSESSMENT AND PLAN:  This is a very pleasant 70years old white male recently diagnosed with extensive stage small cell lung cancer currently undergoing systemic chemotherapy with carboplatin and etoposide status post 4 cycles and tolerating his treatment well except for pancytopenia. He is also status post whole brain irradiation. The patient is currently on observation and feeling fine. The recent CT scan of the chest, abdomen and pelvis showed no evidence for disease progression. I recommended for the patient to continue on observation with repeat CT scan of the chest, abdomen and pelvis in 3 months. For the weight loss, I strongly encouraged the patient to increase his by mouth intake. I would consider referring him to a dietitian if needed. The patient and his family agreed to the current plan. The patient was advised to call immediately if he has any concerning symptoms in the interval. The patient voices understanding of current disease status and treatment options and is in agreement with the current care plan.  All questions were answered. The patient knows to call the clinic with any problems, questions or concerns. We  can certainly see the patient much sooner if necessary.  Disclaimer: This note was dictated with voice recognition software. Similar sounding words can inadvertently be transcribed and may not be corrected upon review.

## 2016-07-19 NOTE — Patient Instructions (Signed)
Smoking Cessation, Tips for Success If you are ready to quit smoking, congratulations! You have chosen to help yourself be healthier. Cigarettes bring nicotine, tar, carbon monoxide, and other irritants into your body. Your lungs, heart, and blood vessels will be able to work better without these poisons. There are many different ways to quit smoking. Nicotine gum, nicotine patches, a nicotine inhaler, or nicotine nasal spray can help with physical craving. Hypnosis, support groups, and medicines help break the habit of smoking. WHAT THINGS CAN I DO TO MAKE QUITTING EASIER?  Here are some tips to help you quit for good:  Pick a date when you will quit smoking completely. Tell all of your friends and family about your plan to quit on that date.  Do not try to slowly cut down on the number of cigarettes you are smoking. Pick a quit date and quit smoking completely starting on that day.  Throw away all cigarettes.   Clean and remove all ashtrays from your home, work, and car.  On a card, write down your reasons for quitting. Carry the card with you and read it when you get the urge to smoke.  Cleanse your body of nicotine. Drink enough water and fluids to keep your urine clear or pale yellow. Do this after quitting to flush the nicotine from your body.  Learn to predict your moods. Do not let a bad situation be your excuse to have a cigarette. Some situations in your life might tempt you into wanting a cigarette.  Never have "just one" cigarette. It leads to wanting another and another. Remind yourself of your decision to quit.  Change habits associated with smoking. If you smoked while driving or when feeling stressed, try other activities to replace smoking. Stand up when drinking your coffee. Brush your teeth after eating. Sit in a different chair when you read the paper. Avoid alcohol while trying to quit, and try to drink fewer caffeinated beverages. Alcohol and caffeine may urge you to  smoke.  Avoid foods and drinks that can trigger a desire to smoke, such as sugary or spicy foods and alcohol.  Ask people who smoke not to smoke around you.  Have something planned to do right after eating or having a cup of coffee. For example, plan to take a walk or exercise.  Try a relaxation exercise to calm you down and decrease your stress. Remember, you may be tense and nervous for the first 2 weeks after you quit, but this will pass.  Find new activities to keep your hands busy. Play with a pen, coin, or rubber band. Doodle or draw things on paper.  Brush your teeth right after eating. This will help cut down on the craving for the taste of tobacco after meals. You can also try mouthwash.   Use oral substitutes in place of cigarettes. Try using lemon drops, carrots, cinnamon sticks, or chewing gum. Keep them handy so they are available when you have the urge to smoke.  When you have the urge to smoke, try deep breathing.  Designate your home as a nonsmoking area.  If you are a heavy smoker, ask your health care provider about a prescription for nicotine chewing gum. It can ease your withdrawal from nicotine.  Reward yourself. Set aside the cigarette money you save and buy yourself something nice.  Look for support from others. Join a support group or smoking cessation program. Ask someone at home or at work to help you with your plan   to quit smoking.  Always ask yourself, "Do I need this cigarette or is this just a reflex?" Tell yourself, "Today, I choose not to smoke," or "I do not want to smoke." You are reminding yourself of your decision to quit.  Do not replace cigarette smoking with electronic cigarettes (commonly called e-cigarettes). The safety of e-cigarettes is unknown, and some may contain harmful chemicals.  If you relapse, do not give up! Plan ahead and think about what you will do the next time you get the urge to smoke. HOW WILL I FEEL WHEN I QUIT SMOKING? You  may have symptoms of withdrawal because your body is used to nicotine (the addictive substance in cigarettes). You may crave cigarettes, be irritable, feel very hungry, cough often, get headaches, or have difficulty concentrating. The withdrawal symptoms are only temporary. They are strongest when you first quit but will go away within 10-14 days. When withdrawal symptoms occur, stay in control. Think about your reasons for quitting. Remind yourself that these are signs that your body is healing and getting used to being without cigarettes. Remember that withdrawal symptoms are easier to treat than the major diseases that smoking can cause.  Even after the withdrawal is over, expect periodic urges to smoke. However, these cravings are generally short lived and will go away whether you smoke or not. Do not smoke! WHAT RESOURCES ARE AVAILABLE TO HELP ME QUIT SMOKING? Your health care provider can direct you to community resources or hospitals for support, which may include:  Group support.  Education.  Hypnosis.  Therapy.   This information is not intended to replace advice given to you by your health care provider. Make sure you discuss any questions you have with your health care provider.   Document Released: 07/27/2004 Document Revised: 11/19/2014 Document Reviewed: 04/16/2013 Elsevier Interactive Patient Education 2016 Elsevier Inc.  

## 2016-07-20 ENCOUNTER — Telehealth: Payer: Self-pay | Admitting: Medical Oncology

## 2016-07-20 NOTE — Telephone Encounter (Signed)
err

## 2016-07-20 NOTE — Telephone Encounter (Signed)
-----   Message from Karie Mainland, RD sent at 07/19/2016 11:36 AM EDT ----- Regarding: RE: ensure 1 can /day cause diarrhea? Unlikely but possible.  He could try Ensure Clear or Boost Breeze. ----- Message ----- From: Ardeen Garland, RN Sent: 07/19/2016   9:50 AM To: Karie Mainland, RD Subject: ensure 1 can /day cause diarrhea?              Thinks his ensure causes diarrhea-can it?

## 2016-07-20 NOTE — Telephone Encounter (Signed)
Message left on daughters voice mail.

## 2016-07-24 ENCOUNTER — Other Ambulatory Visit: Payer: Self-pay | Admitting: Cardiovascular Disease

## 2016-07-24 NOTE — Telephone Encounter (Signed)
Pharmacy requesting a refill on nystatin, would Dr. Gwenlyn Found like to refill this medication? Please advise

## 2016-07-30 DIAGNOSIS — M4726 Other spondylosis with radiculopathy, lumbar region: Secondary | ICD-10-CM | POA: Diagnosis not present

## 2016-07-30 DIAGNOSIS — M47812 Spondylosis without myelopathy or radiculopathy, cervical region: Secondary | ICD-10-CM | POA: Diagnosis not present

## 2016-07-30 DIAGNOSIS — Z79891 Long term (current) use of opiate analgesic: Secondary | ICD-10-CM | POA: Diagnosis not present

## 2016-07-30 DIAGNOSIS — G894 Chronic pain syndrome: Secondary | ICD-10-CM | POA: Diagnosis not present

## 2016-08-01 ENCOUNTER — Other Ambulatory Visit: Payer: Self-pay | Admitting: *Deleted

## 2016-08-01 NOTE — Telephone Encounter (Signed)
Returned a fax to pharmacy denying refill request for nystatin. Patient will need to get refill from his PCP. This is not a cardiac medication. Patient also reported at his last visit that he was no longer taking this medication.

## 2016-08-06 ENCOUNTER — Ambulatory Visit (HOSPITAL_COMMUNITY): Payer: Medicare Other

## 2016-08-06 ENCOUNTER — Ambulatory Visit
Admission: RE | Admit: 2016-08-06 | Discharge: 2016-08-06 | Disposition: A | Discharge status: Home / Self Care | Payer: Medicare Other | Source: Ambulatory Visit | Attending: Radiation Oncology | Admitting: Radiation Oncology

## 2016-08-06 DIAGNOSIS — C7949 Secondary malignant neoplasm of other parts of nervous system: Principal | ICD-10-CM

## 2016-08-06 DIAGNOSIS — C3490 Malignant neoplasm of unspecified part of unspecified bronchus or lung: Secondary | ICD-10-CM | POA: Diagnosis not present

## 2016-08-06 DIAGNOSIS — C7931 Secondary malignant neoplasm of brain: Secondary | ICD-10-CM

## 2016-08-06 MED ORDER — GADOBENATE DIMEGLUMINE 529 MG/ML IV SOLN
14.0000 mL | Freq: Once | INTRAVENOUS | Status: AC | PRN
  Administered 2016-08-06: 14 mL via INTRAVENOUS

## 2016-08-07 ENCOUNTER — Telehealth: Payer: Self-pay | Admitting: Internal Medicine

## 2016-08-07 NOTE — Telephone Encounter (Signed)
Spoke with wife re appointments for 10/13, 12/6 and 12/13. [facility] radiology will call re scan

## 2016-08-10 ENCOUNTER — Encounter: Payer: Self-pay | Admitting: Radiation Oncology

## 2016-08-10 ENCOUNTER — Ambulatory Visit
Admission: RE | Admit: 2016-08-10 | Discharge: 2016-08-10 | Disposition: A | Discharge status: Home / Self Care | Payer: Medicare Other | Source: Ambulatory Visit | Attending: Radiation Oncology | Admitting: Radiation Oncology

## 2016-08-10 VITALS — BP 103/68 | HR 70 | Temp 98.1°F | Ht 66.5 in | Wt 154.6 lb

## 2016-08-10 DIAGNOSIS — Z7982 Long term (current) use of aspirin: Secondary | ICD-10-CM | POA: Insufficient documentation

## 2016-08-10 DIAGNOSIS — R5383 Other fatigue: Secondary | ICD-10-CM | POA: Diagnosis not present

## 2016-08-10 DIAGNOSIS — Z08 Encounter for follow-up examination after completed treatment for malignant neoplasm: Secondary | ICD-10-CM | POA: Diagnosis not present

## 2016-08-10 DIAGNOSIS — Z79899 Other long term (current) drug therapy: Secondary | ICD-10-CM | POA: Insufficient documentation

## 2016-08-10 DIAGNOSIS — C7931 Secondary malignant neoplasm of brain: Secondary | ICD-10-CM | POA: Diagnosis not present

## 2016-08-10 DIAGNOSIS — Z888 Allergy status to other drugs, medicaments and biological substances status: Secondary | ICD-10-CM | POA: Diagnosis not present

## 2016-08-10 DIAGNOSIS — Z923 Personal history of irradiation: Secondary | ICD-10-CM | POA: Diagnosis not present

## 2016-08-10 NOTE — Progress Notes (Signed)
Andrew Shaw is here for follow up of radiation completed 03/21/16 to his Whole Brain. He denies pain except lower back pain a 4/10. He has some fatigue. He reports loose stools yesterday which has improved today. He is not eating well at home. He tells me he has had taste changes, and is unable to eat certain foods if he cannot taste them. He is drinking one ensure daily. He is drinking milk, coffee, and pepsi. He had an MRI of his head on 08/06/16 and is here for the results. He denies headaches. He reports feeling dizzy at times when bending over.   BP 103/68   Pulse 70   Temp 98.1 F (36.7 C)   Ht 5' 6.5" (1.689 m)   Wt 154 lb 9.6 oz (70.1 kg)   SpO2 99% Comment: room air  BMI 24.58 kg/m    Wt Readings from Last 3 Encounters:  08/10/16 154 lb 9.6 oz (70.1 kg)  07/19/16 157 lb 14.4 oz (71.6 kg)  05/25/16 171 lb 3.2 oz (77.7 kg)

## 2016-08-10 NOTE — Addendum Note (Signed)
Encounter addended by: Eppie Gibson, MD on: 08/10/2016  5:58 PM<BR>    Actions taken: Order Entry activity accessed, Diagnosis association updated, Visit Navigator Flowsheet section accepted

## 2016-08-10 NOTE — Progress Notes (Addendum)
Radiation Oncology         [phone] ________________________________  Name: Andrew Shaw MRN: [id number]  Date: 08/10/2016  DOB: 24-Aug-1946  Follow-Up Visit Note  Outpatient  CC: [name], MD  [name], *  Diagnosis and Prior Radiotherapy:    ICD-9-CM ICD-10-CM   1. Other fatigue 780.79 R53.83 TSH  2. 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   Narrative:  The patient returns today for routine follow-up. He denies pain except lower back pain 4/10. He has some fatigue. He reports loose stools yesterday. That has improved today. He is not eating well at home. He states his taste has changed and he is unable to eat certain foods if he cannot taste them. He is drinking one ensure daily. He is drinking milk, coffee, and pepsi.  He had an MRI of his head on 08/06/16 and is here for the results. He denies headaches and denies depression. He reports feeling dizzy at times when bending over.  ALLERGIES:  is allergic to cortisone; dronabinol; latex; lisinopril; antihistamines, chlorpheniramine-type; and flexeril [cyclobenzaprine].  Meds: Current Outpatient Prescriptions  Medication Sig Dispense Refill  . aspirin EC 81 MG tablet Take 81 mg by mouth daily.    . diphenoxylate-atropine (LOMOTIL) 2.5-0.025 MG tablet Take 2 tablets by mouth 4 (four) times daily as needed for diarrhea or loose stools. 30 tablet 0  . fenofibrate (TRICOR) 48 MG tablet Take 48 mg by mouth daily.    Marland Kitchen HYDROcodone-acetaminophen (NORCO) 10-325 MG per tablet Take 1 tablet by mouth every 4 (four) hours as needed (breakthrough pain).     Marland Kitchen lidocaine-prilocaine (EMLA) cream Apply one  application to port a cath 1-2 hours prior to access. 30 g 2  . metoprolol (LOPRESSOR) 50 MG tablet Take 1 tablet (50 mg total) by mouth 2 (two) times daily. PLEASE CONTACT OFFICE FOR ADDITIONAL REFILLS 30 tablet 0  . mometasone (ELOCON) 0.1 % cream Apply 1 application topically daily as needed (for rash). Reported on 05/25/2016  3  . OXYCONTIN 20 MG T12A 12 hr tablet Take 20 mg by mouth every 8 (eight) hours.  0  . prochlorperazine (COMPAZINE) 10 MG tablet Take 1 tablet (10 mg total) by mouth every 6 (six) hours as needed for nausea or vomiting. 30 tablet 1  . rosuvastatin (CRESTOR) 20 MG tablet Take 20 mg by mouth daily.    Marland Kitchen triamcinolone cream (KENALOG) 0.5 % Reported on 05/25/2016  1  . irbesartan (AVAPRO) 150 MG tablet TAKE 1 TABLET(150 MG) BY MOUTH DAILY (Patient not taking: Reported on 08/10/2016) 30 tablet 0  . LORazepam (ATIVAN) 1 MG tablet Take 1 tablet (1 mg total) by mouth at bedtime. (Patient not taking: Reported on 08/10/2016) 30 tablet 1  . nitroGLYCERIN (NITROSTAT) 0.4 MG SL tablet Place 1 tablet (0.4 mg total) under the tongue every 5 (five) minutes x 3 doses as needed for chest pain. (Patient not taking: Reported on 08/10/2016) 25 tablet 2  . nystatin (MYCOSTATIN) 100000 UNIT/ML suspension TAKE 5 ML BY MOUTH FOUR TIMES DAILY (Patient not taking: Reported on 08/10/2016) 60 mL 0  No current facility-administered medications for this encounter.     Physical Findings: The patient is in no acute distress. Patient is alert and oriented.  height is 5' 6.5" (1.689 m) and weight is 154 lb 9.6 oz (70.1 kg). His temperature is 98.1 F (36.7 C). His blood pressure is 103/68 and his pulse is 70. His oxygen saturation is 99%. .    General: Alert and oriented, in no acute distress HEENT: Head is normocephalic. Extraocular movements are intact. Oropharynx is clear. No oral thrush. Neck: Neck is supple, no palpable cervical or supraclavicular lymphadenopathy. Heart: Regular in rate and rhythm with  no murmurs, rubs, or gallops. Chest: Clear to auscultation bilaterally, with no rhonchi, wheezes, or rales.  Lymphatics: see Neck Exam Skin: No concerning lesions. Notable for dryness and bleeding over his hands, but not his palms. Musculoskeletal: symmetric strength and muscle tone throughout 4/5 in all extremities. Neurologic: Cranial nerves II through XII are grossly intact. No obvious focalities. Speech is fluent. Coordination is intact. Psychiatric: Judgment and insight are intact. Affect is blunted.   Lab Findings: Lab Results  Component Value Date   WBC 4.5 07/13/2016   HGB 11.4 (L) 07/13/2016   HCT 34.2 (L) 07/13/2016   MCV 103.8 (H) 07/13/2016   PLT 123 (L) 07/13/2016    Radiographic Findings: Ct Chest W Contrast  Result Date: 07/13/2016 CLINICAL DATA:  History of lung cancer. EXAM: CT CHEST, ABDOMEN, AND PELVIS WITH CONTRAST TECHNIQUE: Multidetector CT imaging of the chest, abdomen and pelvis was performed following the standard protocol during bolus administration of intravenous contrast. CONTRAST:  131m ISOVUE-300 IOPAMIDOL (ISOVUE-300) INJECTION 61% COMPARISON:  04/04/2016 FINDINGS: CT CHEST FINDINGS Mediastinum/Lymph Nodes: The heart size appears normal. No pericardial effusion. Aortic atherosclerosis is noted. Calcification within the RCA, LAD and left circumflex Coronary artery noted. The trachea appears patent and is midline. Normal appearance of the esophagus. The index sub- carinal lymph node measures 6 mm, image 32 series 2. Previously 1.1 cm. No new or enlarging mediastinal or hilar lymph nodes. 6 mm right hilar node is stable from previous exam. Lungs/Pleura: No pleural effusion. Diffuse bronchial wall thickening is identified. Areas of airway opacification within the posterior right lower lobe identified likely reflecting mucous plugging. Similar to previous exam. No suspicious nodule or mass identified within the lungs. Musculoskeletal: The bones appear osteopenic. No  aggressive lytic or sclerotic bone lesions.No chest wall mass or suspicious bone lesions identified. CT ABDOMEN PELVIS FINDINGS Hepatobiliary: No masses or other significant abnormality. Pancreas: No mass, inflammatory changes, or other significant abnormality. Spleen: The spleen appears normal. Adrenals/Urinary Tract: Normal appearance of the adrenal glands. Tiny cyst is noted within the upper pole of left kidney. This is unchanged. No mass or hydronephrosis identified. The urinary bladder appears normal. Stomach/Bowel: No evidence of obstruction, inflammatory process, or abnormal fluid collections. Vascular/Lymphatic: Aortic atherosclerosis identified. No aneurysm. No enlarged retroperitoneal or mesenteric adenopathy. No enlarged pelvic or inguinal lymph nodes. Reproductive: Prostate gland appears enlarged. Other: None. Musculoskeletal:  No suspicious bone lesions identified. IMPRESSION: 1. Continued improvement in sub- carinal and right hilar adenopathy. No new foci of metastatic disease identified. 2. Aortic atherosclerosis and Coronary artery calcifications. Electronically Signed   By: TKerby MoorsM.D.   On: 07/13/2016 11:10   Mr BJeri CosWTMContrast  Result Date: 08/06/2016 CLINICAL DATA:  Metastatic lung cancer. Five months post stereotactic radiosurgery EXAM: MRI HEAD WITHOUT AND WITH CONTRAST TECHNIQUE: Multiplanar, multiecho pulse sequences of the brain and surrounding structures were obtained without  and with intravenous contrast. CONTRAST:  43m MULTIHANCE GADOBENATE DIMEGLUMINE 529 MG/ML IV SOLN COMPARISON:  MRI head 02/23/2016, 08/08/2015 FINDINGS: Brain: Overall interval improvement in metastatic disease to the brain. Many of the lesions have resolved or contracted in size. No new lesions identified. The hemorrhagic lesion in the left inferior basal ganglia has contracted. Right medial temporal lobe lesion has resolved. Cerebellar vermis lesion has resolved. 4 mm enhancing lesion left cerebellum  is unchanged in size Left head of caudate lesion is smaller measuring 2.5 mm with decreased enhancement. Left temporoparietal lesion axial image number 80 measures 4 mm and appears slightly larger. Left parietal cortical lesion axial image number 100 measures 6 mm and appears slightly larger. High left parietal lesion has resolved. Ventricle size normal normal. Diffuse white matter changes have progressed likely due to radiation and underlying chronic microvascular ischemia. Negative for acute infarct. Vascular: Normal flow voids. Skull and upper cervical spine: Normal marrow signal. Sinuses/Orbits: Negative. Other: Negative soft tissues IMPRESSION: Overall positive response to treatment. Multiple metastatic deposits in the brain have resolved or decreased in size. Two lesions in the left temporoparietal lobe, and left parietal lobe are slightly larger compared with the prior study Electronically Signed   By: CFranchot GalloM.D.   On: 08/06/2016 15:54   Ct Abdomen Pelvis W Contrast  Result Date: 07/13/2016 CLINICAL DATA:  History of lung cancer. EXAM: CT CHEST, ABDOMEN, AND PELVIS WITH CONTRAST TECHNIQUE: Multidetector CT imaging of the chest, abdomen and pelvis was performed following the standard protocol during bolus administration of intravenous contrast. CONTRAST:  1029mISOVUE-300 IOPAMIDOL (ISOVUE-300) INJECTION 61% COMPARISON:  04/04/2016 FINDINGS: CT CHEST FINDINGS Mediastinum/Lymph Nodes: The heart size appears normal. No pericardial effusion. Aortic atherosclerosis is noted. Calcification within the RCA, LAD and left circumflex Coronary artery noted. The trachea appears patent and is midline. Normal appearance of the esophagus. The index sub- carinal lymph node measures 6 mm, image 32 series 2. Previously 1.1 cm. No new or enlarging mediastinal or hilar lymph nodes. 6 mm right hilar node is stable from previous exam. Lungs/Pleura: No pleural effusion. Diffuse bronchial wall thickening is identified.  Areas of airway opacification within the posterior right lower lobe identified likely reflecting mucous plugging. Similar to previous exam. No suspicious nodule or mass identified within the lungs. Musculoskeletal: The bones appear osteopenic. No aggressive lytic or sclerotic bone lesions.No chest wall mass or suspicious bone lesions identified. CT ABDOMEN PELVIS FINDINGS Hepatobiliary: No masses or other significant abnormality. Pancreas: No mass, inflammatory changes, or other significant abnormality. Spleen: The spleen appears normal. Adrenals/Urinary Tract: Normal appearance of the adrenal glands. Tiny cyst is noted within the upper pole of left kidney. This is unchanged. No mass or hydronephrosis identified. The urinary bladder appears normal. Stomach/Bowel: No evidence of obstruction, inflammatory process, or abnormal fluid collections. Vascular/Lymphatic: Aortic atherosclerosis identified. No aneurysm. No enlarged retroperitoneal or mesenteric adenopathy. No enlarged pelvic or inguinal lymph nodes. Reproductive: Prostate gland appears enlarged. Other: None. Musculoskeletal:  No suspicious bone lesions identified. IMPRESSION: 1. Continued improvement in sub- carinal and right hilar adenopathy. No new foci of metastatic disease identified. 2. Aortic atherosclerosis and Coronary artery calcifications. Electronically Signed   By: TaKerby Moors.D.   On: 07/13/2016 11:10    Impression/Plan:  Patient is recovering from RT. Tired. Overall improved brain MRI after RT.   New TSH ordered for fatigue and is pending. Lab Results  Component Value Date   TSH 2.002 11/12/2015    Advised the patient to try various  meal replacements to maintain weight. Advised patient to take his Nexium once a day for heartburn.   MRI scan to followup in December at a different center than this time per patient's request. Will Have [facility] reach out to patient about a cancer support group.   _____________________________________   Eppie Gibson, MD  This document serves as a record of services personally performed by Eppie Gibson, MD. It was created on her behalf by Bethann Humble, a trained medical scribe. The creation of this record is based on the scribe's personal observations and the provider's statements to them. This document has been checked and approved by the attending provider.

## 2016-08-13 ENCOUNTER — Telehealth: Payer: Self-pay | Admitting: *Deleted

## 2016-08-13 ENCOUNTER — Telehealth: Payer: Self-pay

## 2016-08-13 LAB — TSH: TSH: 2.293 m(IU)/L (ref 0.320–4.118)

## 2016-08-13 NOTE — Telephone Encounter (Signed)
CALLED PATIENT TO INFORM OF NUTRITION APPT. ON 08-15-16 @ 10:30 AM WITH BARBARA NEFF, SPOKE WITH PATIENT AND HE IS AWARE OF THIS APPT.

## 2016-08-13 NOTE — Telephone Encounter (Signed)
I called an informed Andrew Shaw that his recent TSH level was normal. He voiced his understanding and knows to call me if he has any further questions.

## 2016-08-14 ENCOUNTER — Encounter: Payer: Self-pay | Admitting: *Deleted

## 2016-08-14 ENCOUNTER — Telehealth: Payer: Self-pay | Admitting: Nutrition

## 2016-08-14 NOTE — Telephone Encounter (Signed)
Patient called to reschedule  Nutrition appointment. 08/14/16

## 2016-08-14 NOTE — Progress Notes (Signed)
[place] Work  Clinical Social Work was referred by P[employer] manager to provide information on support groups.  CSW contacted patient by phone and provided information on the Living with Advanced Cancer support group.  Andrew Shaw shared he feels he has a good support system through his church and is unsure if he's interested in a support group at this time.  CSW encouraged patient to consider attending a group and follow up with CSW as needed.  Polo Riley, MSW, LCSW, OSW-C Clinical Social Worker [facility] [phone]

## 2016-08-15 ENCOUNTER — Encounter: Payer: Medicare Other | Admitting: Nutrition

## 2016-08-22 ENCOUNTER — Encounter: Payer: Self-pay | Admitting: Nutrition

## 2016-08-22 ENCOUNTER — Encounter: Payer: Medicare Other | Admitting: Nutrition

## 2016-08-22 NOTE — Progress Notes (Signed)
Patient did not show up for scheduled nutrition appointment.

## 2016-08-24 ENCOUNTER — Ambulatory Visit (HOSPITAL_BASED_OUTPATIENT_CLINIC_OR_DEPARTMENT_OTHER): Payer: Medicare Other

## 2016-08-24 DIAGNOSIS — Z452 Encounter for adjustment and management of vascular access device: Secondary | ICD-10-CM | POA: Diagnosis present

## 2016-08-24 DIAGNOSIS — Z95828 Presence of other vascular implants and grafts: Secondary | ICD-10-CM

## 2016-08-24 DIAGNOSIS — C3490 Malignant neoplasm of unspecified part of unspecified bronchus or lung: Secondary | ICD-10-CM | POA: Diagnosis not present

## 2016-08-24 DIAGNOSIS — Z23 Encounter for immunization: Secondary | ICD-10-CM | POA: Diagnosis not present

## 2016-08-24 MED ORDER — SODIUM CHLORIDE 0.9 % IJ SOLN
10.0000 mL | INTRAMUSCULAR | Status: DC | PRN
  Administered 2016-08-24: 10 mL via INTRAVENOUS
  Filled 2016-08-24: qty 10

## 2016-08-24 MED ORDER — HEPARIN SOD (PORK) LOCK FLUSH 100 UNIT/ML IV SOLN
500.0000 [IU] | Freq: Once | INTRAVENOUS | Status: AC | PRN
  Administered 2016-08-24: 500 [IU] via INTRAVENOUS
  Filled 2016-08-24: qty 5

## 2016-08-27 DIAGNOSIS — M4726 Other spondylosis with radiculopathy, lumbar region: Secondary | ICD-10-CM | POA: Diagnosis not present

## 2016-08-27 DIAGNOSIS — M47812 Spondylosis without myelopathy or radiculopathy, cervical region: Secondary | ICD-10-CM | POA: Diagnosis not present

## 2016-08-27 DIAGNOSIS — G894 Chronic pain syndrome: Secondary | ICD-10-CM | POA: Diagnosis not present

## 2016-08-27 DIAGNOSIS — Z79891 Long term (current) use of opiate analgesic: Secondary | ICD-10-CM | POA: Diagnosis not present

## 2016-08-29 DIAGNOSIS — Z85828 Personal history of other malignant neoplasm of skin: Secondary | ICD-10-CM | POA: Diagnosis not present

## 2016-08-29 DIAGNOSIS — L308 Other specified dermatitis: Secondary | ICD-10-CM | POA: Diagnosis not present

## 2016-09-19 ENCOUNTER — Other Ambulatory Visit: Payer: Self-pay | Admitting: *Deleted

## 2016-09-19 DIAGNOSIS — C7949 Secondary malignant neoplasm of other parts of nervous system: Principal | ICD-10-CM

## 2016-09-19 DIAGNOSIS — C7931 Secondary malignant neoplasm of brain: Secondary | ICD-10-CM

## 2016-09-27 DIAGNOSIS — G894 Chronic pain syndrome: Secondary | ICD-10-CM | POA: Diagnosis not present

## 2016-09-27 DIAGNOSIS — M47812 Spondylosis without myelopathy or radiculopathy, cervical region: Secondary | ICD-10-CM | POA: Diagnosis not present

## 2016-09-27 DIAGNOSIS — Z79891 Long term (current) use of opiate analgesic: Secondary | ICD-10-CM | POA: Diagnosis not present

## 2016-09-27 DIAGNOSIS — M4726 Other spondylosis with radiculopathy, lumbar region: Secondary | ICD-10-CM | POA: Diagnosis not present

## 2016-10-17 ENCOUNTER — Other Ambulatory Visit (HOSPITAL_BASED_OUTPATIENT_CLINIC_OR_DEPARTMENT_OTHER): Payer: Medicare Other

## 2016-10-17 ENCOUNTER — Ambulatory Visit (HOSPITAL_BASED_OUTPATIENT_CLINIC_OR_DEPARTMENT_OTHER): Payer: Medicare Other

## 2016-10-17 DIAGNOSIS — C3491 Malignant neoplasm of unspecified part of right bronchus or lung: Secondary | ICD-10-CM

## 2016-10-17 DIAGNOSIS — C3490 Malignant neoplasm of unspecified part of unspecified bronchus or lung: Secondary | ICD-10-CM | POA: Diagnosis present

## 2016-10-17 DIAGNOSIS — Z95828 Presence of other vascular implants and grafts: Secondary | ICD-10-CM

## 2016-10-17 LAB — COMPREHENSIVE METABOLIC PANEL
ALK PHOS: 61 U/L (ref 40–150)
ALT: 9 U/L (ref 0–55)
ANION GAP: 9 meq/L (ref 3–11)
AST: 20 U/L (ref 5–34)
Albumin: 3.1 g/dL — ABNORMAL LOW (ref 3.5–5.0)
BUN: 9 mg/dL (ref 7.0–26.0)
CALCIUM: 9.1 mg/dL (ref 8.4–10.4)
CHLORIDE: 106 meq/L (ref 98–109)
CO2: 23 mEq/L (ref 22–29)
Creatinine: 0.8 mg/dL (ref 0.7–1.3)
EGFR: 89 mL/min/{1.73_m2} — AB (ref >90)
Glucose: 98 mg/dl (ref 70–140)
POTASSIUM: 3.7 meq/L (ref 3.5–5.1)
Sodium: 138 mEq/L (ref 136–145)
Total Bilirubin: 0.43 mg/dL (ref 0.20–1.20)
Total Protein: 6.5 g/dL (ref 6.4–8.3)

## 2016-10-17 LAB — CBC WITH DIFFERENTIAL/PLATELET
BASO%: 0.3 % (ref 0.0–2.0)
BASOS ABS: 0 10*3/uL (ref 0.0–0.1)
EOS ABS: 0.2 10*3/uL (ref 0.0–0.5)
EOS%: 5.7 % (ref 0.0–7.0)
HEMATOCRIT: 36.5 % — AB (ref 38.4–49.9)
HGB: 12.4 g/dL — ABNORMAL LOW (ref 13.0–17.1)
LYMPH#: 0.6 10*3/uL — AB (ref 0.9–3.3)
LYMPH%: 17.9 % (ref 14.0–49.0)
MCH: 34.5 pg — AB (ref 27.2–33.4)
MCHC: 34 g/dL (ref 32.0–36.0)
MCV: 101.7 fL — AB (ref 79.3–98.0)
MONO#: 0.7 10*3/uL (ref 0.1–0.9)
MONO%: 19.9 % — ABNORMAL HIGH (ref 0.0–14.0)
NEUT#: 2 10*3/uL (ref 1.5–6.5)
NEUT%: 56.2 % (ref 39.0–75.0)
PLATELETS: 145 10*3/uL (ref 140–400)
RBC: 3.59 10*6/uL — ABNORMAL LOW (ref 4.20–5.82)
RDW: 13.8 % (ref 11.0–14.6)
WBC: 3.5 10*3/uL — ABNORMAL LOW (ref 4.0–10.3)

## 2016-10-17 MED ORDER — SODIUM CHLORIDE 0.9 % IJ SOLN
10.0000 mL | INTRAMUSCULAR | Status: DC | PRN
  Administered 2016-10-17: 10 mL via INTRAVENOUS
  Filled 2016-10-17: qty 10

## 2016-10-17 MED ORDER — HEPARIN SOD (PORK) LOCK FLUSH 100 UNIT/ML IV SOLN
500.0000 [IU] | Freq: Once | INTRAVENOUS | Status: AC | PRN
  Administered 2016-10-17: 500 [IU] via INTRAVENOUS
  Filled 2016-10-17: qty 5

## 2016-10-18 ENCOUNTER — Ambulatory Visit (HOSPITAL_COMMUNITY)
Admission: RE | Admit: 2016-10-18 | Discharge: 2016-10-18 | Disposition: A | Discharge status: Home / Self Care | Payer: Medicare Other | Source: Ambulatory Visit | Attending: Radiation Oncology | Admitting: Radiation Oncology

## 2016-10-18 DIAGNOSIS — C7931 Secondary malignant neoplasm of brain: Secondary | ICD-10-CM | POA: Insufficient documentation

## 2016-10-18 DIAGNOSIS — C7949 Secondary malignant neoplasm of other parts of nervous system: Secondary | ICD-10-CM | POA: Diagnosis not present

## 2016-10-18 DIAGNOSIS — C3490 Malignant neoplasm of unspecified part of unspecified bronchus or lung: Secondary | ICD-10-CM | POA: Diagnosis not present

## 2016-10-18 MED ORDER — GADOBENATE DIMEGLUMINE 529 MG/ML IV SOLN
15.0000 mL | Freq: Once | INTRAVENOUS | Status: AC | PRN
  Administered 2016-10-18: 15 mL via INTRAVENOUS

## 2016-10-22 ENCOUNTER — Ambulatory Visit (HOSPITAL_COMMUNITY): Payer: Medicare Other

## 2016-10-22 ENCOUNTER — Telehealth: Payer: Self-pay | Admitting: *Deleted

## 2016-10-22 NOTE — Telephone Encounter (Signed)
Patient calling to say he is sick and would like to cancel his CT scan for today 10/22/16 and dr mohamed's appt for 10/25/16. Gave # to radiology for him to re-schedule his CT scan and dr Worthy Flank appt cancelled for 10/25/16.

## 2016-10-23 ENCOUNTER — Ambulatory Visit: Admission: RE | Admit: 2016-10-23 | Payer: Medicare Other | Source: Ambulatory Visit | Admitting: Radiation Oncology

## 2016-10-24 ENCOUNTER — Ambulatory Visit: Payer: Medicare Other | Admitting: Internal Medicine

## 2016-10-24 NOTE — Progress Notes (Signed)
Location/Histology of Brain Tumor:  Clinical stage from 08/04/2015: Stage IV (TX, N2, M1b) - Signed by Curt Bears, MD on 08/04/2015       Staging comments: Small cell lung cancer  - LEFT cerebellum, 11 mm. - LEFT temporal lobe, 6 mm. - periventricular posterior LEFT caudate, 4 x 6 mm. - LEFT anterior parietal lobe, 12 x 9 mm.  Patient presented with symptoms of: He presented in February 2016 with chest pain. Workup showed mediastinal and hilar adenopathy. He was referred to a pulmonologist who recommended biopsy. He delayed this until August 2016.  Past or anticipated interventions, if any, per neurosurgery: 07/28/15 PROCEDURE:  Procedure(s): VIDEO BRONCHOSCOPY WITH ENDOBRONCHIAL ULTRASOUND (N/A) SURGEON:  Surgeon(s) and Role:    * [name], MD - Primary  Past or anticipated interventions, if any, per medical oncology:  1) Systemic chemotherapy with carboplatin for AUC of 5 on day 1 and etoposide 120 MG/M2 on days one 2, 3 with Neulasta support on day 4. Status post 4 cycles.  Last dose was given 11/02/2015 discontinued secondary to intolerance. 2) Currently on observation.   07/19/16 Dr. Earlie Server documents: The recent CT scan of the chest, abdomen and pelvis showed no evidence for disease progression. I recommended for the patient to continue on observation with repeat CT scan of the chest, abdomen and pelvis in 3 months.  Mr. Loe has cancelled his recent CT scans and appointment with Dr. Earlie Server for 10/22/16 & 12/14 because of illness. His CT of chest/abdomen/ pelvis has been rescheduled for 11/02/16.   Dose of Decadron, if applicable: No  Recent neurologic symptoms, if any:   Seizures: No  Headaches: He has occasional headaches after his scans.   Nausea: No  Dizziness/ataxia: No  Difficulty with hand coordination: No  Focal numbness/weakness: No  Visual deficits/changes: No  Confusion/Memory deficits: No   SAFETY ISSUES:  Prior radiation?  03/06/2016-03/21/2016: Whole brain and right hilum to 30 gy in 10 fractions at 3 gy per fraction.   Pacemaker/ICD? No  Possible current pregnancy? N/A  Is the patient on methotrexate? No  Additional Complaints / other details:  MRI Brain 10/18/16 - LEFT cerebellum, image 42, 11 mm. - LEFT temporal lobe, image 94, 6 mm. - periventricular posterior LEFT caudate, image 98, 4 x 6 mm. - LEFT anterior parietal lobe, image 114, 12 x 9 mm.  Interval growth of four intracranial metastases as described above, when compared with the prior most recent study from 08/06/2016. See discussion above. The other lesions remain contracted or resolved.  BP 122/79   Pulse 95   Temp 97.8 F (36.6 C)   Ht 5' 6.5" (1.689 m)   Wt 155 lb 3.2 oz (70.4 kg)   SpO2 100% Comment: room air  BMI 24.67 kg/m

## 2016-10-26 ENCOUNTER — Ambulatory Visit
Admission: RE | Admit: 2016-10-26 | Discharge: 2016-10-26 | Disposition: A | Discharge status: Home / Self Care | Payer: Medicare Other | Source: Ambulatory Visit | Attending: Radiation Oncology | Admitting: Radiation Oncology

## 2016-10-26 ENCOUNTER — Encounter: Payer: Self-pay | Admitting: Radiation Oncology

## 2016-10-26 DIAGNOSIS — N400 Enlarged prostate without lower urinary tract symptoms: Secondary | ICD-10-CM | POA: Diagnosis not present

## 2016-10-26 DIAGNOSIS — Z79899 Other long term (current) drug therapy: Secondary | ICD-10-CM | POA: Insufficient documentation

## 2016-10-26 DIAGNOSIS — E785 Hyperlipidemia, unspecified: Secondary | ICD-10-CM | POA: Insufficient documentation

## 2016-10-26 DIAGNOSIS — C3490 Malignant neoplasm of unspecified part of unspecified bronchus or lung: Secondary | ICD-10-CM | POA: Diagnosis not present

## 2016-10-26 DIAGNOSIS — F17210 Nicotine dependence, cigarettes, uncomplicated: Secondary | ICD-10-CM | POA: Diagnosis not present

## 2016-10-26 DIAGNOSIS — C7931 Secondary malignant neoplasm of brain: Secondary | ICD-10-CM

## 2016-10-26 DIAGNOSIS — R5383 Other fatigue: Secondary | ICD-10-CM | POA: Diagnosis not present

## 2016-10-26 DIAGNOSIS — I252 Old myocardial infarction: Secondary | ICD-10-CM | POA: Diagnosis not present

## 2016-10-26 DIAGNOSIS — K219 Gastro-esophageal reflux disease without esophagitis: Secondary | ICD-10-CM | POA: Diagnosis not present

## 2016-10-26 DIAGNOSIS — Z7982 Long term (current) use of aspirin: Secondary | ICD-10-CM | POA: Diagnosis not present

## 2016-10-26 DIAGNOSIS — I10 Essential (primary) hypertension: Secondary | ICD-10-CM | POA: Diagnosis not present

## 2016-10-26 DIAGNOSIS — Z923 Personal history of irradiation: Secondary | ICD-10-CM | POA: Diagnosis not present

## 2016-10-26 DIAGNOSIS — C3491 Malignant neoplasm of unspecified part of right bronchus or lung: Secondary | ICD-10-CM | POA: Diagnosis not present

## 2016-10-26 DIAGNOSIS — J449 Chronic obstructive pulmonary disease, unspecified: Secondary | ICD-10-CM | POA: Diagnosis not present

## 2016-10-26 DIAGNOSIS — I2510 Atherosclerotic heart disease of native coronary artery without angina pectoris: Secondary | ICD-10-CM | POA: Insufficient documentation

## 2016-10-26 MED ORDER — HEPARIN SOD (PORK) LOCK FLUSH 100 UNIT/ML IV SOLN
500.0000 [IU] | Freq: Once | INTRAVENOUS | Status: AC
  Administered 2016-10-26: 500 [IU] via INTRAVENOUS

## 2016-10-26 MED ORDER — SODIUM CHLORIDE 0.9% FLUSH
10.0000 mL | Freq: Once | INTRAVENOUS | Status: AC
  Administered 2016-10-26: 10 mL via INTRAVENOUS

## 2016-10-26 MED ORDER — SODIUM CHLORIDE 0.9 % IJ SOLN: 10.0000 mL | INTRAMUSCULAR | Status: AC | PRN

## 2016-10-26 NOTE — Progress Notes (Signed)
Flushed right port a cath with 54m normal saline then also flushed with 500u/583mheparin, excellent blood retuurn, flushed well, de accessed and band aid applied over site, needle intact 3:49 PM

## 2016-10-26 NOTE — Progress Notes (Signed)
  Name: Andrew Shaw MRN: [id number]  Date: 10/26/2016  DOB: November 20, 1945  SIMULATION AND TREATMENT PLANNING NOTE    ICD-9-CM ICD-10-CM   1. Brain metastases ([place]) 198.3 C79.31       Radiation Oncology         [phone] ________________________________  Name: Andrew Shaw MRN: [id number]  Date: 10/26/2016  DOB: 1946/09/29  outpatient SIMULATION AND TREATMENT PLANNING NOTE DIAGNOSIS:    ICD-9-CM ICD-10-CM   1. Brain metastases ([place]) 198.3 C79.31     NARRATIVE:  The patient was brought to the [facility].  Identity was confirmed.  All relevant records and images related to the planned course of therapy were reviewed.  The patient freely provided informed written consent to proceed with treatment after reviewing the details related to the planned course of therapy. The consent form was witnessed and verified by the simulation staff. Intravenous access was established for contrast administration. Then, the patient was set-up in a stable reproducible supine position for radiation therapy.  A relocatable thermoplastic stereotactic head frame was fabricated for precise immobilization.  CT images were obtained.  Surface markings were placed.  The CT images were loaded into the planning software and fused with the patient's targeting MRI scan.  Then the target and avoidance structures were contoured.  Treatment planning then occurred.  The radiation prescription was entered and confirmed.  I have requested 3D planning  I have requested a DVH of the following structures: Brain stem, brain, left eye, right eye, lenses, optic chiasm, target volumes, uninvolved brain, and normal tissue.    SPECIAL TREATMENT PROCEDURE:  The planned course of therapy using radiation constitutes a special treatment procedure. Special care is required in the management of this patient for the following reasons:  High dose per fraction requiring special monitoring for increased toxicities of treatment  including daily imaging.  The special nature of the planned course of radiotherapy will require increased physician supervision and oversight to ensure patient's safety with optimal treatment outcomes.  PLAN:  The patient will receive 20 Gy in 1 fraction to 4 progressive brain metastases  ________________________________    Eppie Gibson, MD

## 2016-10-26 NOTE — Progress Notes (Addendum)
Radiation Oncology         [phone] ________________________________  Outpatient Re-Consultation  Name: Andrew Shaw MRN: [id number]  Date: 10/26/2016  DOB: 03/21/1946  HY:QMVH Alroy Dust, MD  [name], *   REFERRING PHYSICIAN: [name], *  DIAGNOSIS: The encounter diagnosis was Brain metastases [facility]).    ICD-9-CM ICD-10-CM   1. Brain metastases ([place]) 198.3 C79.31    Chief Complaint: I am here for my MRI results  HISTORY OF PRESENT ILLNESS::Andrew Shaw is a 70 y.o. male who was originally seen in our multidisciplinary lung clinic September 2016 with Dr. Pablo Ledger. He received palliative whole brain and right hilum radiation treatment, completed in May 2017.   Recent Brain MRI on 10/18/2016 unfortunately shows interval growth of four intracranial metastases when compared with most recent study from 08/06/2016. The other lesions remain contracted or resolved. This was discussed at tumor board and SRS was recommended by the board for salvage of the progressive lesions.  The patient is here for evaluation and discussion of re-irradiation of his progressive brain metastases.  He presents to our clinic accompanied by his daughter. He is not taking steroids at this time. He reports baseline right temporal headaches which come and go since having his scans. He reports dizziness if he bends over. He denies seizures, nausea, difficulty with hand coordination, focal numbness/weakness, visual deficits/changes, or confusion/memory deficits.   The patient is somewhat frustrated because he was not contacted before radiation planning and treatment appointments were made. He was originally scheduled for follow up this past Monday but was unable to come in due to illness. We went ahead and penciled him onto the schedule to avoid delays in treatment.   He also ran into some issue when he went to [facility] for 3T imaging to find out he is unable to have this type of  imaging performed due to heart stents and screws in his neck. He was sent to another location to have scans performed on a different machine.  No other new symptomatic complaints. His skin rash over his hands has somewhat improved.  PREVIOUS RADIATION THERAPY: Yes, Whole brain and right hilum to 30 Gy in 10 fractions (03/06/2016 to 03/21/2016) with Dr. Pablo Ledger   PAST MEDICAL HISTORY:  has a past medical history of Arthritis; Back pain (12/23/2012); BPH (benign prostatic hyperplasia); Carpal tunnel syndrome; Carpal tunnel syndrome, right; Chronic back pain; COPD (chronic obstructive pulmonary disease) ([place]); Coronary artery disease; DVT (deep venous thrombosis) ([place]); GERD (gastroesophageal reflux disease); radiation therapy (03/06/16- 03/21/2016); Hyperlipemia; Hypertension; Lung cancer ([place]); Myocardial infarction (2005); Neuromuscular disorder ([place]); Neuropathy ([place]); S/P angioplasty with stent, BMS to LCX  12/23/12 (12/23/2012); Shortness of breath; and Skin rash (08/22/2015).    PAST SURGICAL HISTORY: Past Surgical History:  Procedure Laterality Date  . BACK SURGERY  2004   lumb fusion  . CARDIAC CATHETERIZATION  12/23/2012   Mid nondominant AV groove circumflex stented with a 2.5x19m Mini Vision stent resulting in a reduction of 90% stenosis to 0% residual  . CARDIAC CATHETERIZATION  02/07/2004   Noncritical CAD, continue medical therapy  . CARDIAC CATHETERIZATION  02/03/1999   Recommended medical therapy  . CARPAL TUNNEL RELEASE  04/15/2012   Procedure: CARPAL TUNNEL RELEASE;  Surgeon: GWynonia Sours MD;  Location: [place]  Service: Orthopedics;  Laterality: Left;  . CERVICAL FUSION  1999  . COLONOSCOPY W/ POLYPECTOMY    . EPIDURAL BLOCK INJECTION     multiple lumbar  . HERNIA  REPAIR  2008   umb   . JOINT REPLACEMENT Left 2008   lt total knee  . KNEE ARTHROSCOPY  04,06   left  . LEFT HEART CATHETERIZATION WITH CORONARY ANGIOGRAM N/A 12/23/2012   Procedure: LEFT HEART  CATHETERIZATION WITH CORONARY ANGIOGRAM;  Surgeon: Lorretta Harp, MD;  Location: [facility] CATH LAB;  Service: Cardiovascular;  Laterality: N/A;  . LEFT HEART CATHETERIZATION WITH CORONARY ANGIOGRAM N/A 01/11/2015   Procedure: LEFT HEART CATHETERIZATION WITH CORONARY ANGIOGRAM;  Surgeon: Peter M Martinique, MD;  Location[facility] C[facility] LAB;  Service: Cardiovascular;  Laterality: N/A;  . MANIPULATION KNEE JOINT Left 2009   closed lt knee   . PERCUTANEOUS CORONARY STENT INTERVENTION (PCI-S)  12/23/2012   Procedure: PERCUTANEOUS CORONARY STENT INTERVENTION (PCI-S);  Surgeon: Lorretta Harp, MD;  Location: [facility] CATH LAB;  Service: Cardiovascular;;  . TRIGGER FINGER RELEASE  04/15/2012   Procedure: RELEASE TRIGGER FINGER/A-1 PULLEY;  Surgeon: Wynonia Sours, MD;  Location: [place];  Service: Orthopedics;  Laterality: Left;  left thumb and little finger  . VIDEO BRONCHOSCOPY WITH ENDOBRONCHIAL ULTRASOUND N/A 07/28/2015   Procedure: VIDEO BRONCHOSCOPY WITH ENDOBRONCHIAL ULTRASOUND;  Surgeon: [name], MD;  Location: [place];  Service: Thoracic;  Laterality: N/A;    FAMILY HISTORY: family history includes Cancer in his brother; Diabetes in his sister; Heart disease in his mother; Hypertension in his sister.  SOCIAL HISTORY:  reports that he has been smoking Cigarettes and E-cigarettes.  He has a 12.50 pack-year smoking history. He has never used smokeless tobacco. He reports that he does not drink alcohol or use drugs.  ALLERGIES: Cortisone; Dronabinol; Latex; Lisinopril; Antihistamines, chlorpheniramine-type; and Flexeril [cyclobenzaprine]  MEDICATIONS:  Current Outpatient Prescriptions  Medication Sig Dispense Refill  . aspirin EC 81 MG tablet Take 81 mg by mouth daily.    . fenofibrate (TRICOR) 48 MG tablet Take 48 mg by mouth daily.    Marland Kitchen HYDROcodone-acetaminophen (NORCO) 10-325 MG per tablet Take 1 tablet by mouth every 4 (four) hours as needed (breakthrough pain).     Marland Kitchen  lidocaine-prilocaine (EMLA) cream Apply one application to port a cath 1-2 hours prior to access. 30 g 2  . metoprolol (LOPRESSOR) 50 MG tablet Take 1 tablet (50 mg total) by mouth 2 (two) times daily. PLEASE CONTACT OFFICE FOR ADDITIONAL REFILLS 30 tablet 0  . mometasone (ELOCON) 0.1 % cream Apply 1 application topically daily as needed (for rash). Reported on 05/25/2016  3  . OXYCONTIN 20 MG T12A 12 hr tablet Take 20 mg by mouth every 8 (eight) hours.  0  . rosuvastatin (CRESTOR) 20 MG tablet Take 20 mg by mouth daily.    Marland Kitchen triamcinolone cream (KENALOG) 0.5 % Reported on 05/25/2016  1  . diphenoxylate-atropine (LOMOTIL) 2.5-0.025 MG tablet Take 2 tablets by mouth 4 (four) times daily as needed for diarrhea or loose stools. (Patient not taking: Reported on 10/26/2016) 30 tablet 0  . irbesartan (AVAPRO) 150 MG tablet TAKE 1 TABLET(150 MG) BY MOUTH DAILY (Patient not taking: Reported on 10/26/2016) 30 tablet 0  . LORazepam (ATIVAN) 1 MG tablet Take 1 tablet (1 mg total) by mouth at bedtime. (Patient not taking: Reported on 10/26/2016) 30 tablet 1  . nitroGLYCERIN (NITROSTAT) 0.4 MG SL tablet Place 1 tablet (0.4 mg total) under the tongue every 5 (five) minutes x 3 doses as needed for chest pain. (Patient not taking: Reported on 10/26/2016) 25 tablet 2  . nystatin (MYCOSTATIN) 100000 UNIT/ML suspension TAKE 5 ML  BY MOUTH FOUR TIMES DAILY (Patient not taking: Reported on 10/26/2016) 60 mL 0  . prochlorperazine (COMPAZINE) 10 MG tablet Take 1 tablet (10 mg total) by mouth every 6 (six) hours as needed for nausea or vomiting. (Patient not taking: Reported on 10/26/2016) 30 tablet 1   Current Facility-Administered Medications  Medication Dose Route Frequency Provider Last Rate Last Dose  . sodium chloride 0.9 % injection 10 mL  10 mL Intravenous PRN Eppie Gibson, MD        REVIEW OF SYSTEMS: as above   PHYSICAL EXAM:  height is 5' 6.5" (1.689 m) and weight is 155 lb 3.2 oz (70.4 kg). His temperature is  97.8 F (36.6 C). His blood pressure is 122/79 and his pulse is 95. His oxygen saturation is 100%.   General: Alert and oriented, in no acute distress HEENT: Head is normocephalic. Extraocular movements are intact. Oropharynx is clear. Alopecia has resolved. Neck: Neck is supple, no palpable cervical or supraclavicular lymphadenopathy. Heart: Regular in rate and rhythm with no murmurs, rubs, or gallops. Chest: Clear to auscultation bilaterally, with no rhonchi, wheezes, or rales. Abdomen: Soft, nondistended, with no rigidity or guarding. Slight tenderness in RUQ, patient states he had a hernia in this area. Extremities: No cyanosis or edema. Lymphatics: see Neck Exam Skin:eczematous rash over hands. Skin over port site is well healed. Vascular: Right upper chest port-a-cath Musculoskeletal: symmetric strength and muscle tone throughout. Neurologic: Cranial nerves II through XII are grossly intact. No obvious focalities. Speech is fluent. Coordination is intact. Psychiatric: Judgment and insight are intact. Affect is appropriate.   LABORATORY DATA:  Lab Results  Component Value Date   WBC 3.5 (L) 10/17/2016   HGB 12.4 (L) 10/17/2016   HCT 36.5 (L) 10/17/2016   MCV 101.7 (H) 10/17/2016   PLT 145 10/17/2016   CMP     Component Value Date/Time   NA 138 10/17/2016 0914   K 3.7 10/17/2016 0914   CL 102 11/16/2015 0457   CO2 23 10/17/2016 0914   GLUCOSE 98 10/17/2016 0914   BUN 9.0 10/17/2016 0914   CREATININE 0.8 10/17/2016 0914   CALCIUM 9.1 10/17/2016 0914   PROT 6.5 10/17/2016 0914   ALBUMIN 3.1 (L) 10/17/2016 0914   AST 20 10/17/2016 0914   ALT 9 10/17/2016 0914   ALKPHOS 61 10/17/2016 0914   BILITOT 0.43 10/17/2016 0914   GFRNONAA >60 11/16/2015 0457   GFRAA >60 11/16/2015 0457         RADIOGRAPHY: Mr Brain W Wo Contrast  Result Date: 10/18/2016 CLINICAL DATA:  Eight month follow-up post whole-brain radiotherapy. Continued surveillance. No new symptoms. Metastatic  small-cell lung cancer. EXAM: MRI HEAD WITHOUT AND WITH CONTRAST TECHNIQUE: Multiplanar, multiecho pulse sequences of the brain and surrounding structures were obtained without and with intravenous contrast. CONTRAST:  38m MULTIHANCE GADOBENATE DIMEGLUMINE 529 MG/ML IV SOLN COMPARISON:  08/06/2016.  02/23/2016. FINDINGS: The study was performed on the 1.5T scanner using SRS protocol, as was done in September, because the patient has a bare metal stent of unknown type (no card or procedure record) which cannot be verified for safety at high field. Brain: Many of the original lesions detected on the April scan remain improved, such as the vermian and cerebellar lesions, RIGHT temporal lesion, and LEFT inferior basal ganglia region. There is however, worsening of four lesions on today's exam which include postcontrast enhancement and central necrosis as follows: - LEFT cerebellum, image 42, 11 mm. - LEFT temporal lobe, image 94, 6  mm. - periventricular posterior LEFT caudate, image 98, 4 x 6 mm. - LEFT anterior parietal lobe, image 114, 12 x 9 mm. Mild atrophy and chronic microvascular ischemic change redemonstrated. Vasogenic edema accompanies the regrowth of all visualized enhancing lesions, most prominent LEFT parietal lobe. Vascular: Normal flow voids. Skull and upper cervical spine: Normal marrow signal. Sinuses/Orbits: Negative. Other: None. IMPRESSION: Interval growth of four intracranial metastases as described above, when compared with the prior most recent study from 08/06/2016. See discussion above. The other lesions remain contracted or resolved. Electronically Signed   By: Staci Righter M.D.   On: 10/18/2016 16:16      IMPRESSION/PLAN: This is a very pleasant 70 year old gentleman with progressive metastatic disease to the brain (small cell lung primary) that has been refractory to radiation.  No recent systemic imaging.  I had a lengthy discussion with the patient after reviewing their MRI results  with them.  We spoke about stereotactic radiosurgery to the brain. We spoke about the risks benefits and side effects of this treatment.  Additionally, we spoke about radionecrosis that can result from stereotactic radiosurgery. I explained that stereotactic radiosurgery only treats the areas of gross disease while sparing the rest of the brain parenchyma. I do not recommend repeat WBRT due to the risks and side effects of this.  After lengthy discussion, the patient would like to proceed with stereotactic brain radiosurgery to their metastatic disease. They will meet with neurosurgery in the near future to discuss this further; a neurosurgeon will participate in their case.  CT simulation will take place later today and treatment next week.  I plan to deliver 20 Gy in 1 fraction to the four refractory metastases.  He has heart stents in place and screws in his neck which do not allow him to have 3T scans. We have made note of this for further imaging appointments.   I spent 25 minutes face to face with the patient and more than 50% of that time was spent in counseling and/or coordination of care.    __________________________________________   Eppie Gibson, MD   This document serves as a record of services personally performed by Eppie Gibson, MD. It was created on her behalf by Arlyce Harman, a trained medical scribe. The creation of this record is based on the scribe's personal observations and the provider's statements to them. This document has been checked and approved by the attending provider.

## 2016-10-29 ENCOUNTER — Other Ambulatory Visit: Payer: Self-pay | Admitting: Cardiovascular Disease

## 2016-10-29 ENCOUNTER — Telehealth: Payer: Self-pay | Admitting: Internal Medicine

## 2016-10-29 DIAGNOSIS — C3491 Malignant neoplasm of unspecified part of right bronchus or lung: Secondary | ICD-10-CM | POA: Diagnosis not present

## 2016-10-29 DIAGNOSIS — G894 Chronic pain syndrome: Secondary | ICD-10-CM | POA: Diagnosis not present

## 2016-10-29 DIAGNOSIS — R5383 Other fatigue: Secondary | ICD-10-CM | POA: Diagnosis not present

## 2016-10-29 DIAGNOSIS — M47812 Spondylosis without myelopathy or radiculopathy, cervical region: Secondary | ICD-10-CM | POA: Diagnosis not present

## 2016-10-29 DIAGNOSIS — Z79891 Long term (current) use of opiate analgesic: Secondary | ICD-10-CM | POA: Diagnosis not present

## 2016-10-29 DIAGNOSIS — M4726 Other spondylosis with radiculopathy, lumbar region: Secondary | ICD-10-CM | POA: Diagnosis not present

## 2016-10-29 DIAGNOSIS — C7931 Secondary malignant neoplasm of brain: Secondary | ICD-10-CM | POA: Diagnosis not present

## 2016-10-29 NOTE — Telephone Encounter (Signed)
sw pt to confirm appt date/time per LOS

## 2016-10-30 DIAGNOSIS — Z6825 Body mass index (BMI) 25.0-25.9, adult: Secondary | ICD-10-CM | POA: Diagnosis not present

## 2016-10-30 DIAGNOSIS — C7931 Secondary malignant neoplasm of brain: Secondary | ICD-10-CM | POA: Diagnosis not present

## 2016-10-31 ENCOUNTER — Encounter: Payer: Self-pay | Admitting: Radiation Oncology

## 2016-10-31 ENCOUNTER — Ambulatory Visit
Admission: RE | Admit: 2016-10-31 | Discharge: 2016-10-31 | Disposition: A | Discharge status: Home / Self Care | Payer: Medicare Other | Source: Ambulatory Visit | Attending: Radiation Oncology | Admitting: Radiation Oncology

## 2016-10-31 VITALS — BP 113/73 | HR 62 | Temp 98.0°F

## 2016-10-31 DIAGNOSIS — C3491 Malignant neoplasm of unspecified part of right bronchus or lung: Secondary | ICD-10-CM | POA: Diagnosis not present

## 2016-10-31 DIAGNOSIS — C7931 Secondary malignant neoplasm of brain: Secondary | ICD-10-CM | POA: Diagnosis not present

## 2016-10-31 DIAGNOSIS — Z923 Personal history of irradiation: Secondary | ICD-10-CM

## 2016-10-31 DIAGNOSIS — R5383 Other fatigue: Secondary | ICD-10-CM | POA: Diagnosis not present

## 2016-10-31 HISTORY — DX: Personal history of irradiation: Z92.3

## 2016-10-31 NOTE — Progress Notes (Addendum)
  Radiation Oncology         [phone] ________________________________  Stereotactic Treatment Procedure Note SPECIAL TREATMENT PROCEDURE  Name: KADDEN OSTERHOUT MRN: [id number]  Date: 10/31/2016  DOB: 01-07-1946     ICD-9-CM ICD-10-CM   1. Brain metastases ([place]) 198.3 C79.31     3D TREATMENT PLANNING AND DOSIMETRY:  The patient's radiation plan was reviewed and approved by neurosurgery and radiation oncology prior to treatment.  It showed 3-dimensional radiation distributions overlaid onto the planning CT/MRI image set.  The [facility] for the target structures as well as the organs at risk were reviewed. The documentation of the 3D plan and dosimetry are filed in the radiation oncology EMR.  NARRATIVE:  JAYKUB MACKINS was brought to the TrueBeam stereotactic radiation treatment machine and placed supine on the CT couch. The head frame was applied, and the patient was set up for stereotactic radiosurgery.  Neurosurgery was present for the set-up and delivery  SIMULATION VERIFICATION:  In the couch zero-angle position, the patient underwent Exactrac imaging using the Brainlab system with orthogonal KV images.  These were carefully aligned and repeated to confirm treatment position for each of the isocenters.  The Exactrac snap film verification was repeated at each couch angle.  SPECIAL TREATMENT PROCEDURE: [name] received stereotactic radiosurgery to the following targets: PTV1 Left anterior parietal 12 mm was treated to 20 Gy in 1 fraction. PTV2 Left cerebellar 11 mm was treated to 20 Gy in 1 fraction. PTV3 Left temporal 6 mm was treated to 20 Gy in 1 fraction. PTV4 Left Caudate 6 mm was treated to 20 Gy in 1 fraction.  These tumors were treated using 12 Dynamic Conformal Arcs with a single isocenter to a prescription dose of 20 Gy.  ExacTrac Snap verification was performed for each couch angle.   This constitutes a special treatment procedure due to the ablative dose delivered  and the technical nature of treatment.  This highly technical modality of treatment ensures that the ablative dose is centered on the patient's tumor while sparing normal tissues from excessive dose and risk of detrimental effects.  STEREOTACTIC TREATMENT MANAGEMENT:  Following delivery, the patient was transported to nursing in stable condition and monitored for possible acute effects.  Vital signs were recorded BP 120/78   Pulse 64   Temp 97.6 F (36.4 C) (Oral)   SpO2 100% . The patient tolerated treatment without significant acute effects, and was discharged to home in stable condition.    PLAN: Follow-up in one month.  ________________________________   Eppie Gibson, MD

## 2016-10-31 NOTE — Progress Notes (Signed)
Andrew Shaw is here for post Andrew Shaw monitoring.  He denies having pain, headache or nausea.  Vitals within normal limits.  Will continue to monitor.

## 2016-10-31 NOTE — Addendum Note (Signed)
Encounter addended by: Jacqulyn Liner, RN on: 10/31/2016  5:11 PM<BR>    Actions taken: Vitals modified, Sign clinical note

## 2016-10-31 NOTE — Op Note (Signed)
Stereotactic Radiosurgery Operative Note  Name: TYTAN SANDATE MRN: [id number]  Date: 10/31/2016  DOB: Jun 22, 1946  Op Note  Pre Operative Diagnosis:  Metastatic small lung cancer with multiple (four) brain metastases (measuring between 6 and 12 mm in diameter)  Post Operative Diagnois:  Metastatic small lung cancer with multiple (four) brain metastases (measuring between 6 and 12 mm in diameter)  3D TREATMENT PLANNING AND DOSIMETRY:  The patient's radiation plan was reviewed and approved by myself (neurosurgery) and Dr. Eppie Gibson (radiation oncology) prior to treatment.  It showed 3-dimensional radiation distributions overlaid onto the planning CT/MRI image set.  The [facility] for the target structures as well as the organs at risk were reviewed. The documentation of the 3D plan and dosimetry are filed in the radiation oncology EMR.  NARRATIVE:  JAYSTEN ESSNER was brought to the TrueBeam stereotactic radiation treatment machine and placed supine on the CT couch. The head frame was applied, and the patient was set up for stereotactic radiosurgery.  I was present for the set-up and delivery.  SIMULATION VERIFICATION:  In the couch zero-angle position, the patient underwent Exactrac imaging using the Brainlab system with orthogonal KV images.  These were carefully aligned and repeated to confirm treatment position for each of the isocenters.  The Exactrac snap film verification was repeated at each couch angle.  SPECIAL TREATMENT PROCEDURE: [name] received stereotactic radiosurgery to the following targets: Using a multitarget, single isocenter technique, 4 targets (left cerebellar, left parietal, left temporal, and left caudate) were treated using 12 Dynamic Conformal Arcs to a prescription dose of 20 Gy for each target.  ExacTrac registration was performed for each couch angle.  The 100% isodose line was prescribed.  STEREOTACTIC TREATMENT MANAGEMENT:  Following delivery, the patient  was transported to nursing in stable condition and monitored for possible acute effects.  The patient tolerated treatment without significant acute effects, and was discharged to home in stable condition.    PLAN: Follow-up in one month.

## 2016-10-31 NOTE — Progress Notes (Signed)
Patient continues to deny having a headache, pain, dizziness or nausea.  He has been discharged from the clinic with his family.

## 2016-11-02 ENCOUNTER — Encounter (HOSPITAL_COMMUNITY): Payer: Self-pay

## 2016-11-02 ENCOUNTER — Ambulatory Visit (HOSPITAL_COMMUNITY)
Admission: RE | Admit: 2016-11-02 | Discharge: 2016-11-02 | Disposition: A | Discharge status: Home / Self Care | Payer: Medicare Other | Source: Ambulatory Visit | Attending: Internal Medicine | Admitting: Internal Medicine

## 2016-11-02 DIAGNOSIS — I2510 Atherosclerotic heart disease of native coronary artery without angina pectoris: Secondary | ICD-10-CM | POA: Insufficient documentation

## 2016-11-02 DIAGNOSIS — C3491 Malignant neoplasm of unspecified part of right bronchus or lung: Secondary | ICD-10-CM | POA: Insufficient documentation

## 2016-11-02 DIAGNOSIS — I700 Atherosclerosis of aorta: Secondary | ICD-10-CM | POA: Insufficient documentation

## 2016-11-02 DIAGNOSIS — C3490 Malignant neoplasm of unspecified part of unspecified bronchus or lung: Secondary | ICD-10-CM | POA: Diagnosis not present

## 2016-11-02 MED ORDER — IOPAMIDOL (ISOVUE-300) INJECTION 61%
100.0000 mL | Freq: Once | INTRAVENOUS | Status: AC | PRN
  Administered 2016-11-02: 100 mL via INTRAVENOUS

## 2016-11-02 NOTE — Progress Notes (Signed)
  Radiation Oncology         [phone] ________________________________  Name: Andrew Shaw MRN: [id number]  Date: 10/31/2016  DOB: [birth year]  End of Treatment Note  Diagnosis:   70 year old gentleman with progressive metastatic disease to the brain (small cell lung primary) that has been refractory to radiation.    ICD-9-CM ICD-10-CM   1. Brain metastases ([place]) 198.3 C79.31      Indication for treatment:  palliative       Radiation treatment dates:   10/31/2016  Site/dose:    PTV1 Left anterior parietal 12 mm was treated to 20 Gy in 1 fraction. PTV2 Left cerebellar 11 mm was treated to 20 Gy in 1 fraction. PTV3 Left temporal 6 mm was treated to 20 Gy in 1 fraction. PTV4 Left Caudate 6 mm was treated to 20 Gy in 1 fraction.  Beams/energy: SBRT/SRT-3D // 6FFF  Narrative: The patient tolerated radiation treatment well.   There were no signs of acute toxicity after treatment.  Plan: The patient has completed radiation treatment. The patient will return to radiation oncology clinic for routine followup in one month. I advised the patient to call or return sooner if they have any questions or concerns related to their recovery or treatment.  -----------------------------------  Eppie Gibson, MD  This document serves as a record of services personally performed by Eppie Gibson, MD. It was created on her behalf by Arlyce Harman, a trained medical scribe. The creation of this record is based on the scribe's personal observations and the provider's statements to them. This document has been checked and approved by the attending provider.

## 2016-11-08 ENCOUNTER — Telehealth: Payer: Self-pay | Admitting: Internal Medicine

## 2016-11-08 ENCOUNTER — Ambulatory Visit (HOSPITAL_BASED_OUTPATIENT_CLINIC_OR_DEPARTMENT_OTHER): Payer: Medicare Other | Admitting: Internal Medicine

## 2016-11-08 ENCOUNTER — Encounter: Payer: Self-pay | Admitting: Internal Medicine

## 2016-11-08 VITALS — BP 116/65 | HR 76 | Temp 97.9°F | Resp 18 | Wt 153.7 lb

## 2016-11-08 DIAGNOSIS — C7931 Secondary malignant neoplasm of brain: Secondary | ICD-10-CM | POA: Diagnosis not present

## 2016-11-08 DIAGNOSIS — C3491 Malignant neoplasm of unspecified part of right bronchus or lung: Secondary | ICD-10-CM | POA: Diagnosis not present

## 2016-11-08 NOTE — Progress Notes (Signed)
[place] [phone]   [phone]) [phone]  OFFICE PROGRESS NOTE  Andrew Shaw, [address] [place] [address]  DIAGNOSIS: Extensive stage (TX, N2, M1b) small cell lung cancer in September 2016 presenting with right hilar and mediastinal lymphadenopathy as well as highly suspicious metastatic disease in the axilla bilaterally.   PRIOR THERAPY:  1) Systemic chemotherapy with carboplatin for AUC of 5 on day 1 and etoposide 120 MG/M2 on days one 2, 3 with Neulasta support on day 4. Status post 4 cycles. Last dose was given 11/02/2015 discontinued secondary to intolerance. 2) whole brain irradiation to multiple metastatic brain lesions under the care of Dr. Pablo Ledger completed 03/21/2016.  CURRENT THERAPY: Observation.  INTERVAL HISTORY: Andrew Shaw 70 y.o. male came to the clinic today for follow-up visit accompanied by his daughter. The patient is feeling fine today with no specific complaints except for the persistent skin rash on the hands and legs. He was evaluated by dermatology in the past. He denied having any chest pain or shortness of breath. He has no fever or chills. He denied having any nausea, vomiting, diarrhea or constipation. His last MRI of the brain on 10/18/2016 showed interval growth of for intracranial metastasis and the patient underwent stereotactic radiotherapy to these lesions under the care of Dr. Isidore Moos. He had repeat CT scan of the chest, abdomen and pelvis performed recently and he is here for evaluation and discussion of his scan results.  MEDICAL HISTORY: Past Medical History:  Diagnosis Date  . Arthritis   . Back pain 12/23/2012  . BPH (benign prostatic hyperplasia)   . Carpal tunnel syndrome   . Carpal tunnel syndrome, right   . Chronic back pain   . COPD (chronic obstructive pulmonary disease) ([facility])   . Coronary artery disease    2D ECHO, 10/31/2010 - EF >55%, normal; NUCLEAR STRESS TEST,  10/23/2010 - perfusion defect in inferior myocardial region, post-stress EF 62%, EKG negative for ischemia  . DVT (deep venous thrombosis) ([facility])    history 2004 after knee surg  . GERD (gastroesophageal reflux disease)   . Hx of radiation therapy 03/06/16- 03/21/2016   Whole Brain and Right [place]  . Hyperlipemia   . Hypertension   . Lung cancer ([facility])   . 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 Neuromuscular disorder ([facility])   . 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 S/P angioplasty with stent, BMS to LCX  12/23/12 12/23/2012  . Shortness of breath    with exertion  . Skin rash 08/22/2015    ALLERGIES:  is allergic to cortisone; dronabinol; latex; lisinopril; antihistamines, chlorpheniramine-type; and flexeril [cyclobenzaprine].  MEDICATIONS:  Current Outpatient Prescriptions  Medication Sig Dispense Refill  . aspirin EC 81 MG tablet Take 81 mg by mouth daily.    . diphenoxylate-atropine (LOMOTIL) 2.5-0.025 MG tablet Take 2 tablets by mouth 4 (four) times daily as needed for diarrhea or loose stools. (Patient not taking: Reported on 10/26/2016) 30 tablet 0  . fenofibrate (TRICOR) 48 MG tablet Take 48 mg by mouth daily.    Marland Kitchen HYDROcodone-acetaminophen (NORCO) 10-325 MG per tablet Take 1 tablet by mouth every 4 (four) hours as needed (breakthrough pain).     . irbesartan (AVAPRO) 150 MG tablet TAKE 1 TABLET(150 MG) BY MOUTH DAILY (Patient not taking: Reported on 10/26/2016) 30 tablet 0  . lidocaine-prilocaine (EMLA) cream Apply one application to port a cath 1-2  hours prior to access. 30 g 2  . LORazepam (ATIVAN) 1 MG tablet Take 1 tablet (1 mg total) by mouth at bedtime. (Patient not taking: Reported on 10/26/2016) 30 tablet 1  . metoprolol (LOPRESSOR) 50 MG tablet Take 1 tablet (50 mg total) by mouth 2 (two) times daily. PLEASE CONTACT OFFICE FOR ADDITIONAL REFILLS 30 tablet 0  . metoprolol (LOPRESSOR) 50 MG tablet TAKE 1 TABLET BY MOUTH TWICE DAILY 180 tablet 0  . mometasone  (ELOCON) 0.1 % cream Apply 1 application topically daily as needed (for rash). Reported on 05/25/2016  3  . nitroGLYCERIN (NITROSTAT) 0.4 MG SL tablet Place 1 tablet (0.4 mg total) under the tongue every 5 (five) minutes x 3 doses as needed for chest pain. (Patient not taking: Reported on 10/26/2016) 25 tablet 2  . nystatin (MYCOSTATIN) 100000 UNIT/ML suspension TAKE 5 ML BY MOUTH FOUR TIMES DAILY (Patient not taking: Reported on 10/26/2016) 60 mL 0  . OXYCONTIN 20 MG T12A 12 hr tablet Take 20 mg by mouth every 8 (eight) hours.  0  . prochlorperazine (COMPAZINE) 10 MG tablet Take 1 tablet (10 mg total) by mouth every 6 (six) hours as needed for nausea or vomiting. (Patient not taking: Reported on 10/26/2016) 30 tablet 1  . rosuvastatin (CRESTOR) 20 MG tablet Take 20 mg by mouth daily.    Marland Kitchen triamcinolone cream (KENALOG) 0.5 % Reported on 05/25/2016  1   Current Facility-Administered Medications  Medication Dose Route Frequency Provider Last Rate Last Dose  . sodium chloride 0.9 % injection 10 mL  10 mL Intravenous PRN Eppie Gibson, MD        SURGICAL HISTORY:  Past Surgical History:  Procedure Laterality Date  . BACK SURGERY  2004   lumb fusion  . CARDIAC CATHETERIZATION  12/23/2012   Mid nondominant AV groove circumflex stented with a 2.5x64m Mini Vision stent resulting in a reduction of 90% stenosis to 0% residual  . CARDIAC CATHETERIZATION  02/07/2004   Noncritical CAD, continue medical therapy  . CARDIAC CATHETERIZATION  02/03/1999   Recommended medical therapy  . CARPAL TUNNEL RELEASE  04/15/2012   Procedure: CARPAL TUNNEL RELEASE;  Surgeon: GWynonia Sours MD;  [facility]  Service: Orthopedics;  Laterality: Left;  . CERVICAL FUSION  1999  . COLONOSCOPY W/ POLYPECTOMY    . EPIDURAL BLOCK INJECTION     multiple lumbar  . HERNIA REPAIR  2008   umb   . JOINT REPLACEMENT Left 2008   lt total knee  . KNEE ARTHROSCOPY  04,06   left  . LEFT HEART CATHETERIZATION  WITH CORONARY ANGIOGRAM N/A 12/23/2012   Procedure: LEFT HEART CATHETERIZATION WITH CORONARY ANGIOGRAM;  Surgeon: JLorretta Harp MD;  [facility];  Service: Cardiovascular;  Laterality: N/A;  . LEFT HEART CATHETERIZATION WITH CORONARY ANGIOGRAM N/A 01/11/2015   Procedure: LEFT HEART CATHETERIZATION WITH CORONARY ANGIOGRAM;  Surgeon: Peter M JMartinique MD;  [facility];  Service: Cardiovascular;  Laterality: N/A;  . MANIPULATION KNEE JOINT Left 2009   closed lt knee   . PERCUTANEOUS CORONARY STENT INTERVENTION (PCI-S)  12/23/2012   Procedure: PERCUTANEOUS CORONARY STENT INTERVENTION (PCI-S);  Surgeon: JLorretta Harp MD;  [facility];  Service: Cardiovascular;;  . TRIGGER FINGER RELEASE  04/15/2012   Procedure: RELEASE TRIGGER FINGER/A-1 PULLEY;  Surgeon: GWynonia Sours MD;  [facility]  Service: Orthopedics;  Laterality: Left;  left thumb and little finger  . VIDEO BRONCHOSCOPY  WITH ENDOBRONCHIAL ULTRASOUND N/A 07/28/2015   Procedure: VIDEO BRONCHOSCOPY WITH ENDOBRONCHIAL ULTRASOUND;  Surgeon: [name], MD;  Location: [facility];  Service: Thoracic;  Laterality: N/A;    REVIEW OF SYSTEMS:  A comprehensive review of systems was negative except for: Integument/breast: positive for rash   PHYSICAL EXAMINATION: General appearance: alert, cooperative and no distress Head: Normocephalic, without obvious abnormality, atraumatic Neck: no adenopathy, no JVD, supple, symmetrical, trachea midline and thyroid not enlarged, symmetric, no tenderness/mass/nodules Lymph nodes: Cervical, supraclavicular, and axillary nodes normal. Resp: clear to auscultation bilaterally Back: symmetric, no curvature. ROM normal. No CVA tenderness. Cardio: regular rate and rhythm, S1, S2 normal, no murmur, click, rub or gallop GI: soft, non-tender; bowel sounds normal; no masses,  no organomegaly Extremities: extremities normal, atraumatic, no cyanosis or edema    ECOG  PERFORMANCE STATUS: 0 - Asymptomatic  Blood pressure 116/65, pulse 76, temperature 97.9 F (36.6 C), temperature source Oral, resp. rate 18, weight 153 lb 11.2 oz (69.7 kg), SpO2 100 %.  LABORATORY DATA: Lab Results  Component Value Date   WBC 3.5 (L) 10/17/2016   HGB 12.4 (L) 10/17/2016   HCT 36.5 (L) 10/17/2016   MCV 101.7 (H) 10/17/2016   PLT 145 10/17/2016      Chemistry      Component Value Date/Time   NA 138 10/17/2016 0914   K 3.7 10/17/2016 0914   CL 102 11/16/2015 0457   CO2 23 10/17/2016 0914   BUN 9.0 10/17/2016 0914   CREATININE 0.8 10/17/2016 0914      Component Value Date/Time   CALCIUM 9.1 10/17/2016 0914   ALKPHOS 61 10/17/2016 0914   AST 20 10/17/2016 0914   ALT 9 10/17/2016 0914   BILITOT 0.43 10/17/2016 0914       RADIOGRAPHIC STUDIES: Ct Chest W Contrast  Result Date: 11/02/2016 CLINICAL DATA:  Lung cancer restaging EXAM: CT CHEST, ABDOMEN, AND PELVIS WITH CONTRAST TECHNIQUE: Multidetector CT imaging of the chest, abdomen and pelvis was performed following the standard protocol during bolus administration of intravenous contrast. CONTRAST:  135m ISOVUE-300 IOPAMIDOL (ISOVUE-300) INJECTION 61% COMPARISON:  07/13/2016 FINDINGS: CT CHEST FINDINGS Cardiovascular: The heart size is normal. No pericardial effusion. Coronary artery calcification is noted. Atherosclerotic calcification is noted in the wall of the thoracic aorta. Right Port-A-Cath tip is positioned at the SVC/ RA junction. Mediastinum/Nodes: Stable posterior left thyroid nodule. No mediastinal lymphadenopathy. There is no hilar lymphadenopathy. The esophagus has normal imaging features. Lungs/Pleura: Scattered areas of airway impaction are identified in the posterior right lower lobe as before. Diffuse bronchial wall thickening noted. No suspicious pulmonary nodule or mass. No focal airspace consolidation. No pleural effusion. Musculoskeletal: Bone windows reveal no worrisome lytic or sclerotic  osseous lesions. CT ABDOMEN PELVIS FINDINGS Hepatobiliary: No focal abnormality within the liver parenchyma. There is no evidence for gallstones, gallbladder wall thickening, or pericholecystic fluid. No intrahepatic or extrahepatic biliary dilation. Pancreas: No focal mass lesion. No dilatation of the main duct. No intraparenchymal cyst. No peripancreatic edema. Spleen: No splenomegaly. No focal mass lesion. Adrenals/Urinary Tract: No adrenal nodule or mass. Kidneys are unremarkable. No evidence for hydroureter. Mild anterior bladder wall thickening appears slightly more prominent than expected for the degree of bladder distention but is not substantially changed in the interval. Stomach/Bowel: Stomach is nondistended. No gastric wall thickening. No evidence of outlet obstruction. Duodenum is normally positioned as is the ligament of Treitz. No small bowel wall thickening. No small bowel dilatation. The terminal ileum is normal. The appendix  is not visualized, but there is no edema or inflammation in the region of the cecum. No gross colonic mass. No colonic wall thickening. No substantial diverticular change. Vascular/Lymphatic: There is abdominal aortic atherosclerosis without aneurysm. Borderline portal caval lymph node is unchanged. No retroperitoneal lymphadenopathy. No pelvic sidewall lymphadenopathy. Reproductive: Prostate gland is enlarged. Other: No intraperitoneal free fluid. Musculoskeletal: Bone windows reveal no worrisome lytic or sclerotic osseous lesions. IMPRESSION: 1. Stable exam. No change in the normal size subcarinal and right hilar lymph nodes noted previously. There is no lymphadenopathy in the chest on today's study. No new or progressive findings to suggest interval development of recurrent or metastatic disease. 2. Coronary artery and thoracoabdominal aortic atherosclerosis. Electronically Signed   By: Misty Stanley M.D.   On: 11/02/2016 16:06   Mr [name] MP Contrast  Result Date:  10/18/2016 CLINICAL DATA:  Eight month follow-up post whole-brain radiotherapy. Continued surveillance. No new symptoms. Metastatic small-cell lung cancer. EXAM: MRI HEAD WITHOUT AND WITH CONTRAST TECHNIQUE: Multiplanar, multiecho pulse sequences of the brain and surrounding structures were obtained without and with intravenous contrast. CONTRAST:  25m MULTIHANCE GADOBENATE DIMEGLUMINE 529 MG/ML IV SOLN COMPARISON:  08/06/2016.  02/23/2016. FINDINGS: The study was performed on the 1.5T scanner using SRS protocol, as was done in September, because the patient has a bare metal stent of unknown type (no card or procedure record) which cannot be verified for safety at high field. Brain: Many of the original lesions detected on the April scan remain improved, such as the vermian and cerebellar lesions, RIGHT temporal lesion, and LEFT inferior basal ganglia region. There is however, worsening of four lesions on today's exam which include postcontrast enhancement and central necrosis as follows: - LEFT cerebellum, image 42, 11 mm. - LEFT temporal lobe, image 94, 6 mm. - periventricular posterior LEFT caudate, image 98, 4 x 6 mm. - LEFT anterior parietal lobe, image 114, 12 x 9 mm. Mild atrophy and chronic microvascular ischemic change redemonstrated. Vasogenic edema accompanies the regrowth of all visualized enhancing lesions, most prominent LEFT parietal lobe. Vascular: Normal flow voids. Skull and upper cervical spine: Normal marrow signal. Sinuses/Orbits: Negative. Other: None. IMPRESSION: Interval growth of four intracranial metastases as described above, when compared with the prior most recent study from 08/06/2016. See discussion above. The other lesions remain contracted or resolved. Electronically Signed   By: JStaci RighterM.D.   On: 10/18/2016 16:16   Ct Abdomen Pelvis W Contrast  Result Date: 11/02/2016 CLINICAL DATA:  Lung cancer restaging EXAM: CT CHEST, ABDOMEN, AND PELVIS WITH CONTRAST TECHNIQUE:  Multidetector CT imaging of the chest, abdomen and pelvis was performed following the standard protocol during bolus administration of intravenous contrast. CONTRAST:  1015mISOVUE-300 IOPAMIDOL (ISOVUE-300) INJECTION 61% COMPARISON:  07/13/2016 FINDINGS: CT CHEST FINDINGS Cardiovascular: The heart size is normal. No pericardial effusion. Coronary artery calcification is noted. Atherosclerotic calcification is noted in the wall of the thoracic aorta. Right Port-A-Cath tip is positioned at the SVC/ RA junction. Mediastinum/Nodes: Stable posterior left thyroid nodule. No mediastinal lymphadenopathy. There is no hilar lymphadenopathy. The esophagus has normal imaging features. Lungs/Pleura: Scattered areas of airway impaction are identified in the posterior right lower lobe as before. Diffuse bronchial wall thickening noted. No suspicious pulmonary nodule or mass. No focal airspace consolidation. No pleural effusion. Musculoskeletal: Bone windows reveal no worrisome lytic or sclerotic osseous lesions. CT ABDOMEN PELVIS FINDINGS Hepatobiliary: No focal abnormality within the liver parenchyma. There is no evidence for gallstones, gallbladder wall thickening, or pericholecystic  fluid. No intrahepatic or extrahepatic biliary dilation. Pancreas: No focal mass lesion. No dilatation of the main duct. No intraparenchymal cyst. No peripancreatic edema. Spleen: No splenomegaly. No focal mass lesion. Adrenals/Urinary Tract: No adrenal nodule or mass. Kidneys are unremarkable. No evidence for hydroureter. Mild anterior bladder wall thickening appears slightly more prominent than expected for the degree of bladder distention but is not substantially changed in the interval. Stomach/Bowel: Stomach is nondistended. No gastric wall thickening. No evidence of outlet obstruction. Duodenum is normally positioned as is the ligament of Treitz. No small bowel wall thickening. No small bowel dilatation. The terminal ileum is normal. The  appendix is not visualized, but there is no edema or inflammation in the region of the cecum. No gross colonic mass. No colonic wall thickening. No substantial diverticular change. Vascular/Lymphatic: There is abdominal aortic atherosclerosis without aneurysm. Borderline portal caval lymph node is unchanged. No retroperitoneal lymphadenopathy. No pelvic sidewall lymphadenopathy. Reproductive: Prostate gland is enlarged. Other: No intraperitoneal free fluid. Musculoskeletal: Bone windows reveal no worrisome lytic or sclerotic osseous lesions. IMPRESSION: 1. Stable exam. No change in the normal size subcarinal and right hilar lymph nodes noted previously. There is no lymphadenopathy in the chest on today's study. No new or progressive findings to suggest interval development of recurrent or metastatic disease. 2. Coronary artery and thoracoabdominal aortic atherosclerosis. Electronically Signed   By: Misty Stanley M.D.   On: 11/02/2016 16:06    ASSESSMENT AND PLAN:  This is a very pleasant 70 years old white male with extensive stage small cell lung cancer diagnosed in September 2016 status post 4 cycles of systemic chemotherapy was carboplatin and etoposide. He was also treated with whole brain irradiation for metastatic brain lesion and most recently with the stereotactic radiotherapy to a recurrent brain metastasis. The patient is feeling fine today. The recent CT scan of the chest, abdomen and pelvis showed no evidence for disease recurrence in the chest, abdomen or pelvis. I discussed the scan results with the patient and his daughter. I recommended for him to continue on observation with repeat CT scan of the chest, abdomen and pelvis in 3 months. He was advised to call immediately if he has any concerning symptoms in the interval. The patient voices understanding of current disease status and treatment options and is in agreement with the current care plan.  All questions were answered. The patient  knows to call the clinic with any problems, questions or concerns. We can certainly see the patient much sooner if necessary. I spent 10 minutes counseling the patient face to face. The total time spent in the appointment was 15 minutes. Disclaimer: This note was dictated with voice recognition software. Similar sounding words can inadvertently be transcribed and may not be corrected upon review.

## 2016-11-08 NOTE — Telephone Encounter (Signed)
Appointments scheduled per 12/28 LOS. Patient given AVS report and calendars with future scheduled appointments. Patient aware of CT scan to be scheduled. Given two bottles of contrast and instructions.

## 2016-11-30 DIAGNOSIS — Z85828 Personal history of other malignant neoplasm of skin: Secondary | ICD-10-CM | POA: Diagnosis not present

## 2016-11-30 DIAGNOSIS — L0889 Other specified local infections of the skin and subcutaneous tissue: Secondary | ICD-10-CM | POA: Diagnosis not present

## 2016-11-30 DIAGNOSIS — L2089 Other atopic dermatitis: Secondary | ICD-10-CM | POA: Diagnosis not present

## 2016-11-30 DIAGNOSIS — L0109 Other impetigo: Secondary | ICD-10-CM | POA: Diagnosis not present

## 2016-11-30 NOTE — Addendum Note (Signed)
Encounter addended by: Eppie Gibson, MD on: 11/30/2016  3:54 PM<BR>    Actions taken: Sign clinical note

## 2016-12-05 ENCOUNTER — Encounter: Payer: Self-pay | Admitting: Radiation Oncology

## 2016-12-07 ENCOUNTER — Encounter: Payer: Self-pay | Admitting: Radiation Oncology

## 2016-12-07 ENCOUNTER — Ambulatory Visit
Admission: RE | Admit: 2016-12-07 | Discharge: 2016-12-07 | Disposition: A | Discharge status: Home / Self Care | Payer: Medicare Other | Source: Ambulatory Visit | Attending: Radiation Oncology | Admitting: Radiation Oncology

## 2016-12-07 DIAGNOSIS — R5383 Other fatigue: Secondary | ICD-10-CM | POA: Diagnosis not present

## 2016-12-07 DIAGNOSIS — C7931 Secondary malignant neoplasm of brain: Secondary | ICD-10-CM | POA: Diagnosis not present

## 2016-12-07 DIAGNOSIS — Z7982 Long term (current) use of aspirin: Secondary | ICD-10-CM | POA: Insufficient documentation

## 2016-12-07 DIAGNOSIS — M545 Low back pain: Secondary | ICD-10-CM | POA: Diagnosis not present

## 2016-12-07 DIAGNOSIS — Z79899 Other long term (current) drug therapy: Secondary | ICD-10-CM | POA: Diagnosis not present

## 2016-12-07 HISTORY — DX: Personal history of irradiation: Z92.3

## 2016-12-07 NOTE — Progress Notes (Signed)
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 Brain metastases ([place]) 198.3 C79.31     CHIEF COMPLAINT: Here for follow-up and surveillance of brain cancer  Interval Since Last Radiation:  1 month 10/31/16 : Brain lesions treated to 20 Gy with SRS  Narrative:  The patient returns today for routine follow-up of SRS treatment to the brain.  On review of systems, the patient reports chronic low back pain. He is currently taking hydrocodone and oxycontin with some relief during the day. Additionally, the patient reports an occasional slight headache, which he relates to neck pain. He reports fatigue. He denies nausea. The patient says he is sleeping a lot during the day, but does not sleep well at night. He is currently on an antibiotic to help with peeling to his hands and feet. He denies confusion.   ALLERGIES:  is allergic to cortisone; dronabinol; latex; lisinopril; antihistamines, chlorpheniramine-type; and flexeril [cyclobenzaprine].  Meds: Current Outpatient Prescriptions  Medication Sig Dispense Refill  . aspirin EC 81 MG tablet Take 81 mg by mouth daily.    . fenofibrate (TRICOR) 48 MG tablet Take 48 mg by mouth daily.    Marland Kitchen HYDROcodone-acetaminophen (NORCO) 10-325 MG per tablet Take 1 tablet by mouth every 4 (four) hours as needed (breakthrough pain).     . irbesartan (AVAPRO) 150 MG tablet TAKE 1 TABLET(150 MG) BY MOUTH DAILY 30 tablet 0  . lidocaine-prilocaine (EMLA) cream Apply one application to port a cath 1-2 hours prior to access. 30 g 2  . metoprolol (LOPRESSOR) 50 MG tablet Take 1 tablet (50 mg total) by mouth 2 (two) times daily. PLEASE  CONTACT OFFICE FOR ADDITIONAL REFILLS 30 tablet 0  . metoprolol (LOPRESSOR) 50 MG tablet TAKE 1 TABLET BY MOUTH TWICE DAILY 180 tablet 0  . mometasone (ELOCON) 0.1 % cream Apply 1 application topically daily as needed (for rash). Reported on 05/25/2016  3  . OXYCONTIN 20 MG T12A 12 hr tablet Take 20 mg by mouth every 8 (eight) hours.  0  . rosuvastatin (CRESTOR) 20 MG tablet Take 20 mg by mouth daily.    Marland Kitchen triamcinolone cream (KENALOG) 0.5 % Reported on 05/25/2016  1  . diphenoxylate-atropine (LOMOTIL) 2.5-0.025 MG tablet Take 2 tablets by mouth 4 (four) times daily as needed for diarrhea or loose stools. (Patient not taking: Reported on 12/07/2016) 30 tablet 0  . LORazepam (ATIVAN) 1 MG tablet Take 1 tablet (1 mg total) by mouth at bedtime. (Patient not taking: Reported on 12/07/2016) 30 tablet 1  . nitroGLYCERIN (NITROSTAT) 0.4 MG SL tablet Place 1 tablet (0.4 mg total) under the tongue every 5 (five) minutes x 3 doses as needed for chest pain. (Patient not taking: Reported on 12/07/2016) 25 tablet 2  . nystatin (MYCOSTATIN) 100000 UNIT/ML suspension TAKE 5 ML BY MOUTH FOUR TIMES DAILY (Patient not taking: Reported on 12/07/2016) 60 mL 0  . prochlorperazine (COMPAZINE) 10 MG tablet Take 1 tablet (10 mg total) by mouth every 6 (six) hours as needed for nausea or vomiting. (Patient not taking: Reported on 12/07/2016) 30 tablet 1   Current Facility-Administered Medications  Medication Dose Route Frequency Provider Last  Rate Last Dose  . sodium chloride 0.9 % injection 10 mL  10 mL Intravenous PRN Eppie Gibson, MD        Physical Findings: The patient is in no acute distress. Patient is alert and oriented.  height is 5' 6.5" (1.689 m) and weight is 152 lb 3.2 oz (69 kg). His temperature is 98.1 F (36.7 C). His blood pressure is 119/69 and his pulse is 70. His oxygen saturation is 100%.  No significant changes. General: Alert and oriented, in no acute distress. HEENT: Extraocular movements are intact.  Oropharynx and oral cavity are clear. Lymphatics: see Neck Exam Skin: The rash over the patient's hands is looking much better - still erythematous, dry. Musculoskeletal: symmetric strength and muscle tone throughout. Neurologic: Rapidly alternating movements are intact. Finger to nose testing is intact. Psychiatric: Judgment and insight are intact. Affect is appropriate.  KPS = 80  100 - Normal; no complaints; no evidence of disease. 90   - Able to carry on normal activity; minor signs or symptoms of disease. 80   - Normal activity with effort; some signs or symptoms of disease. 56   - Cares for self; unable to carry on normal activity or to do active work. 60   - Requires occasional assistance, but is able to care for most of his personal needs. 50   - Requires considerable assistance and frequent medical care. 52   - Disabled; requires special care and assistance. 29   - Severely disabled; hospital admission is indicated although death not imminent. 54   - Very sick; hospital admission necessary; active supportive treatment necessary. 10   - Moribund; fatal processes progressing rapidly. 0     - Dead  Karnofsky DA, Abelmann [place], Craver LS and Burchenal [facility] [phone]) The use of the nitrogen mustards in the palliative treatment of carcinoma: with particular reference to bronchogenic carcinoma Cancer 1 634-56   Lab Findings: Lab Results  Component Value Date   WBC 3.5 (L) 10/17/2016   HGB 12.4 (L) 10/17/2016   HCT 36.5 (L) 10/17/2016   MCV 101.7 (H) 10/17/2016   PLT 145 10/17/2016   Lab Results  Component Value Date   TSH 2.293 08/10/2016       Radiographic Findings: No results found.   Impression/Plan:  Progressive metastatic disease to the brain, status post salvage SRS RT. Doing well, no signs of complications.  The patient will have an MRI brain in 2 months, and follow up with Dr. Sherwood Gambler at that time.   I encouraged the patient to get out of the house occasionally and  walk in order to boost endorphins and gain strength.  _____________________________________   Eppie Gibson, MD  This document serves as a record of services personally performed by Eppie Gibson, MD. It was created on her behalf by Maryla Morrow, a trained medical scribe. The creation of this record is based on the scribe's personal observations and the provider's statements to them. This document has been checked and approved by the attending provider.

## 2016-12-07 NOTE — Progress Notes (Signed)
Andrew Shaw presents for follow up radiation completed 10/31/16 to his brain. He reports chronic back pain. He is taking hydrocodone and oxycontin with some relief during the day. He has fatigue. He is sleeping a lot during the day, but tells me he not sleeping well at night.  He is currently on an antibiotic to help with peeling to his hands and feet. This has improved greatly since his last visit. He reports an occasional slight headache which he relates to neck pain. He denies confusion.   BP 119/69   Pulse 70   Temp 98.1 F (36.7 C)   Ht 5' 6.5" (1.689 m)   Wt 152 lb 3.2 oz (69 kg)   SpO2 100% Comment: room air  BMI 24.20 kg/m    Wt Readings from Last 3 Encounters:  12/07/16 152 lb 3.2 oz (69 kg)  11/08/16 153 lb 11.2 oz (69.7 kg)  10/26/16 155 lb 3.2 oz (70.4 kg)

## 2017-01-02 DIAGNOSIS — M4726 Other spondylosis with radiculopathy, lumbar region: Secondary | ICD-10-CM | POA: Diagnosis not present

## 2017-01-02 DIAGNOSIS — G894 Chronic pain syndrome: Secondary | ICD-10-CM | POA: Diagnosis not present

## 2017-01-02 DIAGNOSIS — M47812 Spondylosis without myelopathy or radiculopathy, cervical region: Secondary | ICD-10-CM | POA: Diagnosis not present

## 2017-01-02 DIAGNOSIS — Z79891 Long term (current) use of opiate analgesic: Secondary | ICD-10-CM | POA: Diagnosis not present

## 2017-01-11 ENCOUNTER — Other Ambulatory Visit: Payer: Self-pay | Admitting: Radiation Therapy

## 2017-01-11 DIAGNOSIS — C7931 Secondary malignant neoplasm of brain: Secondary | ICD-10-CM

## 2017-01-16 ENCOUNTER — Other Ambulatory Visit: Payer: Self-pay | Admitting: *Deleted

## 2017-01-24 ENCOUNTER — Other Ambulatory Visit: Payer: Self-pay | Admitting: Cardiovascular Disease

## 2017-01-30 DIAGNOSIS — M4726 Other spondylosis with radiculopathy, lumbar region: Secondary | ICD-10-CM | POA: Diagnosis not present

## 2017-01-30 DIAGNOSIS — G894 Chronic pain syndrome: Secondary | ICD-10-CM | POA: Diagnosis not present

## 2017-01-30 DIAGNOSIS — Z79891 Long term (current) use of opiate analgesic: Secondary | ICD-10-CM | POA: Diagnosis not present

## 2017-01-30 DIAGNOSIS — M47812 Spondylosis without myelopathy or radiculopathy, cervical region: Secondary | ICD-10-CM | POA: Diagnosis not present

## 2017-02-04 ENCOUNTER — Other Ambulatory Visit: Payer: Medicare Other

## 2017-02-04 ENCOUNTER — Ambulatory Visit
Admission: RE | Admit: 2017-02-04 | Discharge: 2017-02-04 | Disposition: A | Discharge status: Home / Self Care | Payer: Medicare Other | Source: Ambulatory Visit | Attending: Radiation Oncology | Admitting: Radiation Oncology

## 2017-02-04 DIAGNOSIS — C3490 Malignant neoplasm of unspecified part of unspecified bronchus or lung: Secondary | ICD-10-CM | POA: Diagnosis not present

## 2017-02-04 DIAGNOSIS — C7931 Secondary malignant neoplasm of brain: Secondary | ICD-10-CM

## 2017-02-04 MED ORDER — GADOBENATE DIMEGLUMINE 529 MG/ML IV SOLN
14.0000 mL | Freq: Once | INTRAVENOUS | Status: AC | PRN
  Administered 2017-02-04: 14 mL via INTRAVENOUS

## 2017-02-06 ENCOUNTER — Ambulatory Visit (HOSPITAL_COMMUNITY)
Admission: RE | Admit: 2017-02-06 | Discharge: 2017-02-06 | Disposition: A | Discharge status: Home / Self Care | Payer: Medicare Other | Source: Ambulatory Visit | Attending: Internal Medicine | Admitting: Internal Medicine

## 2017-02-06 ENCOUNTER — Encounter (HOSPITAL_COMMUNITY): Payer: Self-pay

## 2017-02-06 ENCOUNTER — Other Ambulatory Visit (HOSPITAL_BASED_OUTPATIENT_CLINIC_OR_DEPARTMENT_OTHER): Payer: Medicare Other

## 2017-02-06 DIAGNOSIS — C3490 Malignant neoplasm of unspecified part of unspecified bronchus or lung: Secondary | ICD-10-CM | POA: Diagnosis not present

## 2017-02-06 DIAGNOSIS — C7931 Secondary malignant neoplasm of brain: Secondary | ICD-10-CM | POA: Insufficient documentation

## 2017-02-06 DIAGNOSIS — I2510 Atherosclerotic heart disease of native coronary artery without angina pectoris: Secondary | ICD-10-CM | POA: Diagnosis not present

## 2017-02-06 DIAGNOSIS — C3491 Malignant neoplasm of unspecified part of right bronchus or lung: Secondary | ICD-10-CM | POA: Insufficient documentation

## 2017-02-06 DIAGNOSIS — I700 Atherosclerosis of aorta: Secondary | ICD-10-CM | POA: Insufficient documentation

## 2017-02-06 DIAGNOSIS — N400 Enlarged prostate without lower urinary tract symptoms: Secondary | ICD-10-CM | POA: Insufficient documentation

## 2017-02-06 HISTORY — DX: Secondary malignant neoplasm of brain: C79.31

## 2017-02-06 LAB — COMPREHENSIVE METABOLIC PANEL
ALT: 10 U/L (ref 0–55)
AST: 16 U/L (ref 5–34)
Albumin: 4.1 g/dL (ref 3.5–5.0)
Alkaline Phosphatase: 59 U/L (ref 40–150)
Anion Gap: 8 mEq/L (ref 3–11)
BUN: 7.5 mg/dL (ref 7.0–26.0)
CHLORIDE: 103 meq/L (ref 98–109)
CO2: 27 mEq/L (ref 22–29)
Calcium: 9.9 mg/dL (ref 8.4–10.4)
Creatinine: 1.1 mg/dL (ref 0.7–1.3)
EGFR: 66 mL/min/{1.73_m2} — AB (ref >90)
GLUCOSE: 110 mg/dL (ref 70–140)
POTASSIUM: 3.9 meq/L (ref 3.5–5.1)
SODIUM: 139 meq/L (ref 136–145)
Total Bilirubin: 0.73 mg/dL (ref 0.20–1.20)
Total Protein: 7.2 g/dL (ref 6.4–8.3)

## 2017-02-06 LAB — CBC WITH DIFFERENTIAL/PLATELET
BASO%: 0.6 % (ref 0.0–2.0)
BASOS ABS: 0 10*3/uL (ref 0.0–0.1)
EOS ABS: 0.2 10*3/uL (ref 0.0–0.5)
EOS%: 6.6 % (ref 0.0–7.0)
HCT: 43.6 % (ref 38.4–49.9)
HGB: 14.7 g/dL (ref 13.0–17.1)
LYMPH%: 14 % (ref 14.0–49.0)
MCH: 35.3 pg — ABNORMAL HIGH (ref 27.2–33.4)
MCHC: 33.8 g/dL (ref 32.0–36.0)
MCV: 104.6 fL — AB (ref 79.3–98.0)
MONO#: 0.4 10*3/uL (ref 0.1–0.9)
MONO%: 12 % (ref 0.0–14.0)
NEUT#: 2.3 10*3/uL (ref 1.5–6.5)
NEUT%: 66.8 % (ref 39.0–75.0)
Platelets: 156 10*3/uL (ref 140–400)
RBC: 4.17 10*6/uL — AB (ref 4.20–5.82)
RDW: 13.5 % (ref 11.0–14.6)
WBC: 3.5 10*3/uL — ABNORMAL LOW (ref 4.0–10.3)
lymph#: 0.5 10*3/uL — ABNORMAL LOW (ref 0.9–3.3)

## 2017-02-06 MED ORDER — IOPAMIDOL (ISOVUE-300) INJECTION 61%
INTRAVENOUS | Status: AC
  Filled 2017-02-06: qty 100

## 2017-02-06 MED ORDER — IOPAMIDOL (ISOVUE-300) INJECTION 61%
100.0000 mL | Freq: Once | INTRAVENOUS | Status: AC | PRN
  Administered 2017-02-06: 100 mL via INTRAVENOUS

## 2017-02-06 MED ORDER — HEPARIN SOD (PORK) LOCK FLUSH 100 UNIT/ML IV SOLN
500.0000 [IU] | Freq: Once | INTRAVENOUS | Status: AC
  Administered 2017-02-06: 500 [IU]

## 2017-02-06 MED ORDER — IOPAMIDOL (ISOVUE-300) INJECTION 61%
30.0000 mL | Freq: Once | INTRAVENOUS | Status: DC | PRN
  Filled 2017-02-06: qty 30

## 2017-02-06 MED ORDER — HEPARIN SOD (PORK) LOCK FLUSH 100 UNIT/ML IV SOLN
INTRAVENOUS | Status: AC
  Administered 2017-02-06: 500 [IU]
  Filled 2017-02-06: qty 5

## 2017-02-06 MED ORDER — IOPAMIDOL (ISOVUE-300) INJECTION 61%
INTRAVENOUS | Status: DC
Start: 2017-02-06 — End: 2017-02-07
  Filled 2017-02-06: qty 30

## 2017-02-11 ENCOUNTER — Encounter: Payer: Self-pay | Admitting: Internal Medicine

## 2017-02-11 ENCOUNTER — Ambulatory Visit (HOSPITAL_BASED_OUTPATIENT_CLINIC_OR_DEPARTMENT_OTHER): Payer: Medicare Other | Admitting: Internal Medicine

## 2017-02-11 ENCOUNTER — Telehealth: Payer: Self-pay | Admitting: Internal Medicine

## 2017-02-11 VITALS — BP 122/62 | HR 71 | Temp 97.7°F | Resp 18 | Ht 66.5 in | Wt 152.0 lb

## 2017-02-11 DIAGNOSIS — C7931 Secondary malignant neoplasm of brain: Secondary | ICD-10-CM

## 2017-02-11 DIAGNOSIS — C3491 Malignant neoplasm of unspecified part of right bronchus or lung: Secondary | ICD-10-CM | POA: Diagnosis not present

## 2017-02-11 NOTE — Telephone Encounter (Signed)
Call day - moved 4/4 f/u to 4/2. Spoke with patient he is aware and agrees to come in today.

## 2017-02-11 NOTE — Progress Notes (Signed)
[place] [phone]   [phone]  OFFICE PROGRESS NOTE  [name], [place] [address] [place] [address]  DIAGNOSIS: Extensive stage (TX, N2, M1b) small cell lung cancer in September 2016 presenting with right hilar and mediastinal lymphadenopathy as well as highly suspicious metastatic disease in the axilla bilaterally.   PRIOR THERAPY:  1) Systemic chemotherapy with carboplatin for AUC of 5 on day 1 and etoposide 120 MG/M2 on days one 2, 3 with Neulasta support on day 4. Status post 4 cycles. Last dose was given 11/02/2015 discontinued secondary to intolerance. 2) whole brain irradiation to multiple metastatic brain lesions under the care of Dr. Pablo Ledger completed 03/21/2016.  CURRENT THERAPY: Observation.  INTERVAL HISTORY: Andrew Shaw 71 y.o. male returns to the clinic today for follow-up visit accompanied by his wife. The patient is feeling fine today was no specific complaints except for mild fatigue. He has a recent fall after tripping. He denied having any chest pain, shortness of breath, cough or hemoptysis. He has no fever or chills. He denied having any significant weight loss or night sweats. He has no nausea or vomiting. He had repeat MRI of the brain as well as CT scan of the chest, abdomen and pelvis performed recently and he is here for evaluation and discussion of his scan results.  MEDICAL HISTORY: Past Medical History:  Diagnosis Date  . Arthritis   . Back pain 12/23/2012  . BPH (benign prostatic hyperplasia)   . Brain metastases ([facility]) dx'd 02/2016  . Carpal tunnel syndrome   . Carpal tunnel syndrome, right   . Chronic back pain   . COPD (chronic obstructive pulmonary disease) ([facility])   . Coronary artery disease    2D ECHO, 10/31/2010 - EF >55%, normal; NUCLEAR STRESS TEST, 10/23/2010 - perfusion defect in inferior myocardial region, post-stress EF 62%, EKG negative for ischemia  . DVT (deep venous  thrombosis) ([facility])    history 2004 after knee surg  . GERD (gastroesophageal reflux disease)   . History of radiation therapy 10/31/2016   SRS radiation, Left anterior parietal, Left cerebellar, Left temporal, Left Caudate (each 20 Gy in 1 fraction)  . Hx of radiation therapy 03/06/16- 03/21/2016   Whole Brain and Right [place]  . Hyperlipemia   . Hypertension   . Lung cancer ([facility]) dx'd 07/2015  . 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 Neuromuscular disorder ([facility])   . 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 S/P angioplasty with stent, BMS to LCX  12/23/12 12/23/2012  . Shortness of breath    with exertion  . Skin rash 08/22/2015    ALLERGIES:  is allergic to cortisone; dronabinol; latex; lisinopril; antihistamines, chlorpheniramine-type; and flexeril [cyclobenzaprine].  MEDICATIONS:  Current Outpatient Prescriptions  Medication Sig Dispense Refill  . aspirin EC 81 MG tablet Take 81 mg by mouth daily.    . fenofibrate (TRICOR) 48 MG tablet Take 48 mg by mouth daily.    Marland Kitchen HYDROcodone-acetaminophen (NORCO) 10-325 MG per tablet Take 1 tablet by mouth every 4 (four) hours as needed (breakthrough pain).     . irbesartan (AVAPRO) 150 MG tablet TAKE 1 TABLET(150 MG) BY MOUTH DAILY 30 tablet 0  . lidocaine-prilocaine (EMLA) cream Apply one application to port a cath 1-2 hours prior to access. 30 g 2  . metoprolol (LOPRESSOR) 50 MG tablet Take 1 tablet (50 mg total) by mouth 2 (two) times daily.  PLEASE CONTACT OFFICE FOR ADDITIONAL REFILLS 30 tablet 0  . metoprolol (LOPRESSOR) 50 MG tablet TAKE 1 TABLET BY MOUTH TWICE DAILY 180 tablet 0  . mometasone (ELOCON) 0.1 % cream Apply 1 application topically daily as needed (for rash). Reported on 05/25/2016  3  . OXYCONTIN 20 MG T12A 12 hr tablet Take 20 mg by mouth every 8 (eight) hours.  0  . rosuvastatin (CRESTOR) 20 MG tablet Take 20 mg by mouth daily.    Marland Kitchen triamcinolone cream (KENALOG) 0.5 % Reported on 05/25/2016  1  .  diphenoxylate-atropine (LOMOTIL) 2.5-0.025 MG tablet Take 2 tablets by mouth 4 (four) times daily as needed for diarrhea or loose stools. (Patient not taking: Reported on 12/07/2016) 30 tablet 0  . LORazepam (ATIVAN) 1 MG tablet Take 1 tablet (1 mg total) by mouth at bedtime. (Patient not taking: Reported on 12/07/2016) 30 tablet 1  . nitroGLYCERIN (NITROSTAT) 0.4 MG SL tablet Place 1 tablet (0.4 mg total) under the tongue every 5 (five) minutes x 3 doses as needed for chest pain. (Patient not taking: Reported on 12/07/2016) 25 tablet 2  . prochlorperazine (COMPAZINE) 10 MG tablet Take 1 tablet (10 mg total) by mouth every 6 (six) hours as needed for nausea or vomiting. (Patient not taking: Reported on 12/07/2016) 30 tablet 1   Current Facility-Administered Medications  Medication Dose Route Frequency Provider Last Rate Last Dose  . sodium chloride 0.9 % injection 10 mL  10 mL Intravenous PRN Eppie Gibson, MD        SURGICAL HISTORY:  Past Surgical History:  Procedure Laterality Date  . BACK SURGERY  2004   lumb fusion  . CARDIAC CATHETERIZATION  12/23/2012   Mid nondominant AV groove circumflex stented with a 2.5x29m Mini Vision stent resulting in a reduction of 90% stenosis to 0% residual  . CARDIAC CATHETERIZATION  02/07/2004   Noncritical CAD, continue medical therapy  . CARDIAC CATHETERIZATION  02/03/1999   Recommended medical therapy  . CARPAL TUNNEL RELEASE  04/15/2012   Procedure: CARPAL TUNNEL RELEASE;  Surgeon: GWynonia Sours MD;  [facility]  Service: Orthopedics;  Laterality: Left;  . CERVICAL FUSION  1999  . COLONOSCOPY W/ POLYPECTOMY    . EPIDURAL BLOCK INJECTION     multiple lumbar  . HERNIA REPAIR  2008   umb   . JOINT REPLACEMENT Left 2008   lt total knee  . KNEE ARTHROSCOPY  04,06   left  . LEFT HEART CATHETERIZATION WITH CORONARY ANGIOGRAM N/A 12/23/2012   Procedure: LEFT HEART CATHETERIZATION WITH CORONARY ANGIOGRAM;  Surgeon: JLorretta Harp MD;   [facility];  Service: Cardiovascular;  Laterality: N/A;  . LEFT HEART CATHETERIZATION WITH CORONARY ANGIOGRAM N/A 01/11/2015   Procedure: LEFT HEART CATHETERIZATION WITH CORONARY ANGIOGRAM;  Surgeon: Peter M JMartinique MD;  [facility];  Service: Cardiovascular;  Laterality: N/A;  . MANIPULATION KNEE JOINT Left 2009   closed lt knee   . PERCUTANEOUS CORONARY STENT INTERVENTION (PCI-S)  12/23/2012   Procedure: PERCUTANEOUS CORONARY STENT INTERVENTION (PCI-S);  Surgeon: JLorretta Harp MD;  [facility];  Service: Cardiovascular;;  . TRIGGER FINGER RELEASE  04/15/2012   Procedure: RELEASE TRIGGER FINGER/A-1 PULLEY;  Surgeon: GWynonia Sours MD;  [facility]  Service: Orthopedics;  Laterality: Left;  left thumb and little finger  . VIDEO BRONCHOSCOPY WITH ENDOBRONCHIAL ULTRASOUND N/A 07/28/2015   Procedure: VIDEO BRONCHOSCOPY WITH ENDOBRONCHIAL ULTRASOUND;  Surgeon: SMelrose Nakayama  MD;  [facility];  Service: Thoracic;  Laterality: N/A;    REVIEW OF SYSTEMS:  A comprehensive review of systems was negative except for: Musculoskeletal: positive for muscle weakness   PHYSICAL EXAMINATION: General appearance: alert, cooperative, fatigued and no distress Head: Normocephalic, without obvious abnormality, atraumatic Neck: no adenopathy, no JVD, supple, symmetrical, trachea midline and thyroid not enlarged, symmetric, no tenderness/mass/nodules Lymph nodes: Cervical, supraclavicular, and axillary nodes normal. Resp: clear to auscultation bilaterally Back: symmetric, no curvature. ROM normal. No CVA tenderness. Cardio: regular rate and rhythm, S1, S2 normal, no murmur, click, rub or gallop GI: Mild tenderness in the right lower quadrant. Extremities: extremities normal, atraumatic, no cyanosis or edema    ECOG PERFORMANCE STATUS: 1 - Symptomatic but completely ambulatory  Blood pressure 122/62, pulse 71, temperature 97.7 F (36.5 C),  temperature source Oral, resp. rate 18, height 5' 6.5" (1.689 m), weight 152 lb (68.9 kg), SpO2 100 %.  LABORATORY DATA: Lab Results  Component Value Date   WBC 3.5 (L) 02/06/2017   HGB 14.7 02/06/2017   HCT 43.6 02/06/2017   MCV 104.6 (H) 02/06/2017   PLT 156 02/06/2017      Chemistry      Component Value Date/Time   NA 139 02/06/2017 1347   K 3.9 02/06/2017 1347   CL 102 11/16/2015 0457   CO2 27 02/06/2017 1347   BUN 7.5 02/06/2017 1347   CREATININE 1.1 02/06/2017 1347      Component Value Date/Time   CALCIUM 9.9 02/06/2017 1347   ALKPHOS 59 02/06/2017 1347   AST 16 02/06/2017 1347   ALT 10 02/06/2017 1347   BILITOT 0.73 02/06/2017 1347       RADIOGRAPHIC STUDIES: Ct Chest W Contrast  Result Date: 02/07/2017 CLINICAL DATA:  Extensive stage small-cell lung carcinoma diagnosed September 2016 status post systemic chemotherapy and whole brain radiation, presenting for restaging. Interval observation. EXAM: CT CHEST, ABDOMEN, AND PELVIS WITH CONTRAST TECHNIQUE: Multidetector CT imaging of the chest, abdomen and pelvis was performed following the standard protocol during bolus administration of intravenous contrast. CONTRAST:  124m ISOVUE-300 IOPAMIDOL (ISOVUE-300) INJECTION 61% COMPARISON:  11/02/2016 CT chest, abdomen and pelvis. FINDINGS: CT CHEST FINDINGS Cardiovascular: Normal heart size. No significant pericardial fluid/thickening. Right internal jugular MediPort terminates at the cavoatrial junction. Left anterior descending, left circumflex and right coronary atherosclerosis. Atherosclerotic nonaneurysmal thoracic aorta. Normal caliber pulmonary arteries. No central pulmonary emboli. Mediastinum/Nodes: Stable heterogeneous hypodense posterior left thyroid lobe 1.5 cm nodule. Unremarkable esophagus. No pathologically enlarged axillary, mediastinal or hilar lymph nodes. Lungs/Pleura: No pneumothorax. No pleural effusion. No acute consolidative airspace disease, lung masses or  significant pulmonary nodules. Stable nonspecific subpleural reticulation in the dependent lower lobes, which may represent hypoventilatory change. Musculoskeletal: No aggressive appearing focal osseous lesions. Mild thoracic spondylosis. CT ABDOMEN PELVIS FINDINGS Hepatobiliary: Normal liver with no liver mass. Normal gallbladder with no radiopaque cholelithiasis. No biliary ductal dilatation. Pancreas: Normal, with no mass or duct dilation. Spleen: Normal size. No mass. Adrenals/Urinary Tract: Normal adrenals. Stable subcentimeter hypodense renal cortical lesion in the anterior upper left kidney, too small to characterize. No new renal lesions. No hydronephrosis. Normal bladder. Stomach/Bowel: Grossly normal stomach. Normal caliber small bowel with no small bowel wall thickening. Normal appendix. Normal large bowel with no diverticulosis, large bowel wall thickening or pericolonic fat stranding. Vascular/Lymphatic: Atherosclerotic abdominal aorta with ectatic infrarenal abdominal aorta, maximum diameter 2.5 cm, stable. Patent portal, splenic and renal veins. No pathologically enlarged lymph nodes in the abdomen or pelvis. Reproductive:  Stable mildly enlarged prostate with nonspecific internal prostatic calcifications. Other: No pneumoperitoneum, ascites or focal fluid collection. Musculoskeletal: No aggressive appearing focal osseous lesions. Mild lumbar spondylosis. IMPRESSION: 1. No evidence of recurrent or metastatic disease in the chest, abdomen or pelvis. 2. Additional findings include aortic atherosclerosis, three-vessel coronary atherosclerosis and mildly enlarged prostate. Electronically Signed   By: Ilona Sorrel M.D.   On: 02/07/2017 08:29   Mr [name] ST Contrast  Result Date: 02/04/2017 CLINICAL DATA:  Small cell lung cancer with brain metastases. Unable to have 3 tesla imaging secondary to coronary stents. SRS 11 months ago.  Restaging. Creatinine was obtained on site at [place] at [address] Results: Creatinine 1.1 mg/dL. EXAM: MRI HEAD WITHOUT AND WITH CONTRAST TECHNIQUE: Multiplanar, multiecho pulse sequences of the brain and surrounding structures were obtained without and with intravenous contrast. CONTRAST:  74m MULTIHANCE GADOBENATE DIMEGLUMINE 529 MG/ML IV SOLN COMPARISON:  Multiple prior MRIs of the brain including 10/18/2016 and 02/23/2016. FINDINGS: Brain: 2 previously seen lesions have increased in size. Lesions are described by image number on series 10. Image 35: The left cerebellar lesion now measures 15 x 17 x 11 mm. This compares with 11 x 12 x 8 mm. Image 82: The lesion along the inferior and posterior caudate head has increased in size, now measuring 5 x 9 x 8.5 mm. This compares with 6 x 4 x 6.5 mm. Image 114: A new punctate lesion is suspected in the right parietal lobe. Image 107: Previously treated left parietal lesion has decreased in size, now measuring at 6 x 9 mm. Image 86: Previously treated left parietal lesion has decreased in size, now measuring 4 mm. No other new lesions are present. Other previously treated lesions are no longer evident. Extensive white matter changes are again seen bilaterally. Moderate atrophy is present. There is no acute hemorrhage. Is remote hemorrhages associated with the left caudate and more superior left parietal lobe lesion. Vascular: Flow is present in the major intracranial arteries. Skull and upper cervical spine: The skullbase is within normal limits. Midline sagittal structures are unremarkable. Sinuses/Orbits: The paranasal sinuses and left mastoid air cells are clear. Minimal fluid is present inferiorly in the right mastoid air cells. No obstructing nasopharyngeal lesion is evident. The globes and orbits are within normal limits. IMPRESSION: 1. Interval increase in size of 2 previously identified lesions including the left cerebellum and left caudate head. 2. Possible new punctate lesion and the anterior right parietal  lobe on image 114 of series 10. 3. Two additional lesions have decreased in size. 4. More remote lesions are no longer visible. 5. Moderate generalized atrophy and diffuse white matter disease has progressed, likely related to treatment effect. Electronically Signed   By: CSan MorelleM.D.   On: 02/04/2017 16:37   Ct Abdomen Pelvis W Contrast  Result Date: 02/07/2017 CLINICAL DATA:  Extensive stage small-cell lung carcinoma diagnosed September 2016 status post systemic chemotherapy and whole brain radiation, presenting for restaging. Interval observation. EXAM: CT CHEST, ABDOMEN, AND PELVIS WITH CONTRAST TECHNIQUE: Multidetector CT imaging of the chest, abdomen and pelvis was performed following the standard protocol during bolus administration of intravenous contrast. CONTRAST:  1056mISOVUE-300 IOPAMIDOL (ISOVUE-300) INJECTION 61% COMPARISON:  11/02/2016 CT chest, abdomen and pelvis. FINDINGS: CT CHEST FINDINGS Cardiovascular: Normal heart size. No significant pericardial fluid/thickening. Right internal jugular MediPort terminates at the cavoatrial junction. Left anterior descending, left circumflex and right coronary atherosclerosis. Atherosclerotic nonaneurysmal thoracic aorta. Normal caliber pulmonary arteries.  No central pulmonary emboli. Mediastinum/Nodes: Stable heterogeneous hypodense posterior left thyroid lobe 1.5 cm nodule. Unremarkable esophagus. No pathologically enlarged axillary, mediastinal or hilar lymph nodes. Lungs/Pleura: No pneumothorax. No pleural effusion. No acute consolidative airspace disease, lung masses or significant pulmonary nodules. Stable nonspecific subpleural reticulation in the dependent lower lobes, which may represent hypoventilatory change. Musculoskeletal: No aggressive appearing focal osseous lesions. Mild thoracic spondylosis. CT ABDOMEN PELVIS FINDINGS Hepatobiliary: Normal liver with no liver mass. Normal gallbladder with no radiopaque cholelithiasis. No  biliary ductal dilatation. Pancreas: Normal, with no mass or duct dilation. Spleen: Normal size. No mass. Adrenals/Urinary Tract: Normal adrenals. Stable subcentimeter hypodense renal cortical lesion in the anterior upper left kidney, too small to characterize. No new renal lesions. No hydronephrosis. Normal bladder. Stomach/Bowel: Grossly normal stomach. Normal caliber small bowel with no small bowel wall thickening. Normal appendix. Normal large bowel with no diverticulosis, large bowel wall thickening or pericolonic fat stranding. Vascular/Lymphatic: Atherosclerotic abdominal aorta with ectatic infrarenal abdominal aorta, maximum diameter 2.5 cm, stable. Patent portal, splenic and renal veins. No pathologically enlarged lymph nodes in the abdomen or pelvis. Reproductive: Stable mildly enlarged prostate with nonspecific internal prostatic calcifications. Other: No pneumoperitoneum, ascites or focal fluid collection. Musculoskeletal: No aggressive appearing focal osseous lesions. Mild lumbar spondylosis. IMPRESSION: 1. No evidence of recurrent or metastatic disease in the chest, abdomen or pelvis. 2. Additional findings include aortic atherosclerosis, three-vessel coronary atherosclerosis and mildly enlarged prostate. Electronically Signed   By: Ilona Sorrel M.D.   On: 02/07/2017 08:29    ASSESSMENT AND PLAN:  This is a very pleasant 71 years old white male with extensive stage small cell lung cancer diagnosed in September 2016 status post systemic chemotherapy with carboplatin and etoposide followed by whole brain irradiation for metastatic brain lesions as well as a stereotactic radiotherapy to her recurrent brain metastasis. The patient has been on observation since that time was no further chemotherapy. He had repeat CT scan of the chest, abdomen and pelvis performed recently that showed no evidence for disease recurrence in the chest, abdomen or pelvis. MRI of the brain showed interval increase in the  size of 2 previously identified lesions in the left cerebellum and left caudate head. He also has a questionable new lesion in the right anterior parietal area. He is scheduled to see Dr. Isidore Moos later this week for evaluation and recommendation regarding these abnormalities. I recommended for the patient to continue on observation for now since it is no evidence of systemic progression. I will see him back for follow-up visit in 6 months with repeat CT scan of the chest, abdomen and pelvis for restaging of his disease. He was advised to call immediately if he has any concerning symptoms in the interval. The patient voices understanding of current disease status and treatment options and is in agreement with the current care plan. All questions were answered. The patient knows to call the clinic with any problems, questions or concerns. We can certainly see the patient much sooner if necessary. I spent 10 minutes counseling the patient face to face. The total time spent in the appointment was 15 minutes.  Disclaimer: This note was dictated with voice recognition software. Similar sounding words can inadvertently be transcribed and may not be corrected upon review.

## 2017-02-12 ENCOUNTER — Telehealth: Payer: Self-pay | Admitting: Internal Medicine

## 2017-02-12 NOTE — Telephone Encounter (Signed)
Patient aware of 6 month appt added - sent letter out. Messaged MD and Rn about Flush appts requested by patient.

## 2017-02-12 NOTE — Telephone Encounter (Signed)
Scheduled flush appts per 02/12/2017 message from Heide Guile - patient aware of appts

## 2017-02-13 ENCOUNTER — Ambulatory Visit: Payer: Medicare Other | Admitting: Internal Medicine

## 2017-02-15 ENCOUNTER — Other Ambulatory Visit: Payer: Self-pay | Admitting: Radiation Therapy

## 2017-02-15 ENCOUNTER — Ambulatory Visit (HOSPITAL_BASED_OUTPATIENT_CLINIC_OR_DEPARTMENT_OTHER): Payer: Medicare Other

## 2017-02-15 VITALS — BP 122/70 | HR 63 | Temp 98.0°F | Resp 16

## 2017-02-15 DIAGNOSIS — C3490 Malignant neoplasm of unspecified part of unspecified bronchus or lung: Secondary | ICD-10-CM | POA: Diagnosis not present

## 2017-02-15 DIAGNOSIS — Z95828 Presence of other vascular implants and grafts: Secondary | ICD-10-CM

## 2017-02-15 DIAGNOSIS — C7931 Secondary malignant neoplasm of brain: Secondary | ICD-10-CM

## 2017-02-15 MED ORDER — SODIUM CHLORIDE 0.9 % IJ SOLN
10.0000 mL | INTRAMUSCULAR | Status: DC | PRN
  Administered 2017-02-15: 10 mL via INTRAVENOUS
  Filled 2017-02-15: qty 10

## 2017-02-15 MED ORDER — HEPARIN SOD (PORK) LOCK FLUSH 100 UNIT/ML IV SOLN
500.0000 [IU] | Freq: Once | INTRAVENOUS | Status: AC | PRN
  Administered 2017-02-15: 500 [IU] via INTRAVENOUS
  Filled 2017-02-15: qty 5

## 2017-02-21 DIAGNOSIS — C7931 Secondary malignant neoplasm of brain: Secondary | ICD-10-CM | POA: Diagnosis not present

## 2017-03-12 ENCOUNTER — Ambulatory Visit (HOSPITAL_COMMUNITY)
Admission: RE | Admit: 2017-03-12 | Discharge: 2017-03-12 | Disposition: A | Discharge status: Home / Self Care | Payer: Medicare Other | Source: Ambulatory Visit | Attending: Radiation Oncology | Admitting: Radiation Oncology

## 2017-03-27 DIAGNOSIS — M47812 Spondylosis without myelopathy or radiculopathy, cervical region: Secondary | ICD-10-CM | POA: Diagnosis not present

## 2017-03-27 DIAGNOSIS — Z79891 Long term (current) use of opiate analgesic: Secondary | ICD-10-CM | POA: Diagnosis not present

## 2017-03-27 DIAGNOSIS — M4726 Other spondylosis with radiculopathy, lumbar region: Secondary | ICD-10-CM | POA: Diagnosis not present

## 2017-03-27 DIAGNOSIS — G894 Chronic pain syndrome: Secondary | ICD-10-CM | POA: Diagnosis not present

## 2017-04-05 ENCOUNTER — Ambulatory Visit (HOSPITAL_COMMUNITY)
Admission: RE | Admit: 2017-04-05 | Discharge: 2017-04-05 | Disposition: A | Discharge status: Home / Self Care | Payer: Medicare Other | Source: Ambulatory Visit | Attending: Radiation Oncology | Admitting: Radiation Oncology

## 2017-04-05 ENCOUNTER — Other Ambulatory Visit: Payer: Medicare Other

## 2017-04-05 DIAGNOSIS — C719 Malignant neoplasm of brain, unspecified: Secondary | ICD-10-CM | POA: Diagnosis not present

## 2017-04-05 DIAGNOSIS — C7931 Secondary malignant neoplasm of brain: Secondary | ICD-10-CM

## 2017-04-05 LAB — POCT I-STAT CREATININE: Creatinine, Ser: 0.9 mg/dL (ref 0.61–1.24)

## 2017-04-05 MED ORDER — GADOBENATE DIMEGLUMINE 529 MG/ML IV SOLN
15.0000 mL | Freq: Once | INTRAVENOUS | Status: AC | PRN
  Administered 2017-04-05: 14 mL via INTRAVENOUS

## 2017-04-10 ENCOUNTER — Ambulatory Visit
Admission: RE | Admit: 2017-04-10 | Discharge: 2017-04-10 | Disposition: A | Discharge status: Home / Self Care | Payer: Medicare Other | Source: Ambulatory Visit | Attending: Radiation Oncology | Admitting: Radiation Oncology

## 2017-04-10 ENCOUNTER — Encounter: Payer: Self-pay | Admitting: Radiation Oncology

## 2017-04-10 ENCOUNTER — Ambulatory Visit (HOSPITAL_BASED_OUTPATIENT_CLINIC_OR_DEPARTMENT_OTHER): Payer: Medicare Other

## 2017-04-10 DIAGNOSIS — Z7982 Long term (current) use of aspirin: Secondary | ICD-10-CM | POA: Diagnosis not present

## 2017-04-10 DIAGNOSIS — Z888 Allergy status to other drugs, medicaments and biological substances status: Secondary | ICD-10-CM | POA: Insufficient documentation

## 2017-04-10 DIAGNOSIS — Z510 Encounter for antineoplastic radiation therapy: Secondary | ICD-10-CM | POA: Diagnosis not present

## 2017-04-10 DIAGNOSIS — C7931 Secondary malignant neoplasm of brain: Secondary | ICD-10-CM | POA: Diagnosis not present

## 2017-04-10 DIAGNOSIS — Z79899 Other long term (current) drug therapy: Secondary | ICD-10-CM | POA: Insufficient documentation

## 2017-04-10 DIAGNOSIS — C3490 Malignant neoplasm of unspecified part of unspecified bronchus or lung: Secondary | ICD-10-CM

## 2017-04-10 DIAGNOSIS — Z85118 Personal history of other malignant neoplasm of bronchus and lung: Secondary | ICD-10-CM | POA: Diagnosis not present

## 2017-04-10 DIAGNOSIS — Z95828 Presence of other vascular implants and grafts: Secondary | ICD-10-CM

## 2017-04-10 DIAGNOSIS — Z452 Encounter for adjustment and management of vascular access device: Secondary | ICD-10-CM

## 2017-04-10 DIAGNOSIS — Z08 Encounter for follow-up examination after completed treatment for malignant neoplasm: Secondary | ICD-10-CM | POA: Diagnosis not present

## 2017-04-10 DIAGNOSIS — R51 Headache: Secondary | ICD-10-CM | POA: Diagnosis not present

## 2017-04-10 MED ORDER — SODIUM CHLORIDE 0.9 % IJ SOLN
10.0000 mL | INTRAMUSCULAR | Status: DC | PRN
  Administered 2017-04-10: 10 mL via INTRAVENOUS
  Filled 2017-04-10: qty 10

## 2017-04-10 MED ORDER — HEPARIN SOD (PORK) LOCK FLUSH 100 UNIT/ML IV SOLN
500.0000 [IU] | Freq: Once | INTRAVENOUS | Status: AC | PRN
  Administered 2017-04-10: 500 [IU] via INTRAVENOUS
  Filled 2017-04-10: qty 5

## 2017-04-10 NOTE — Progress Notes (Signed)
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 Brain metastases ([place]) 198.3 C79.31     CHIEF COMPLAINT: Here for follow-up and surveillance of brain cancer  Interval Since Last Radiation:  5 months  10/31/2016: PTV1 Left anterior parietal 12 mm was treated to 20 Gy in 1 fraction. PTV2 Left cerebellar 11 mm was treated to 20 Gy in 1 fraction. PTV3 Left temporal 6 mm was treated to 20 Gy in 1 fraction. PTV4 Left Caudate 6 mm was treated to 20 Gy in 1 fraction.  03/06/2016-03/21/2016: Whole brain and right hilum to 30 gy in 10 fractions at 3 gy per fraction.   Narrative:  The patient returns today for routine follow-up of SRS treatment and whole brain radiation.  CT CAP on 02/06/17 showed no evidence of recurrent or metastatic disease in the chest, abd, or pelvis. The patient then followed up with Dr. Julien Nordmann on 02/11/17 who recommended observation for now since there was no evidence of systemic progression and would follow up with him 6 months later with a repeat CT scan. MRI of the brain on 04/05/17 showed numerous intracranial metastases that have increased in size from the prior MRI from 02/04/17 and no new focus of abnormal enhancement to suggest interval metastatic disease. This MRI was reviewed in tumor board. 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  The patient reports chronic back pain. He also has right sided headaches that are consistent and have been present for several months and relieved by pressing on his mastoid region - not severe. He feels weak and staggers at times. He relates this to his back pain. He has  a decrease in appetite and has stopped eating meat because it does not appeal to him anymore. He continues to have diarrhea stools x1-2 daily.  ALLERGIES:  is allergic to cortisone; dronabinol; latex; lisinopril; antihistamines, chlorpheniramine-type; and flexeril [cyclobenzaprine].  Meds: Current Outpatient Prescriptions  Medication Sig Dispense Refill  . aspirin EC 81 MG tablet Take 81 mg by mouth daily.    . fenofibrate (TRICOR) 48 MG tablet Take 48 mg by mouth daily.    Marland Kitchen HYDROcodone-acetaminophen (NORCO) 10-325 MG per tablet Take 1 tablet by mouth every 4 (four) hours as needed (breakthrough pain).     . irbesartan (AVAPRO) 150 MG tablet TAKE 1 TABLET(150 MG) BY MOUTH DAILY 30 tablet 0  . lidocaine-prilocaine (EMLA) cream Apply one application to port a cath 1-2 hours prior to access. 30 g 2  . metoprolol (LOPRESSOR) 50 MG tablet Take 1 tablet (50 mg total) by mouth 2 (two) times daily. PLEASE CONTACT OFFICE FOR ADDITIONAL REFILLS 30 tablet 0  . metoprolol (LOPRESSOR) 50 MG tablet TAKE 1 TABLET BY MOUTH TWICE DAILY 180 tablet 0  . OXYCONTIN 20 MG T12A 12 hr tablet Take 20 mg by mouth every 8 (eight) hours.  0  . rosuvastatin (CRESTOR) 20 MG tablet Take 20 mg by mouth daily.    Marland Kitchen triamcinolone cream (KENALOG) 0.5 % Reported on 05/25/2016  1  . diphenoxylate-atropine (LOMOTIL) 2.5-0.025 MG tablet Take 2 tablets by mouth 4 (four) times daily as needed for diarrhea or  loose stools. (Patient not taking: Reported on 12/07/2016) 30 tablet 0  . LORazepam (ATIVAN) 1 MG tablet Take 1 tablet (1 mg total) by mouth at bedtime. (Patient not taking: Reported on 12/07/2016) 30 tablet 1  . mometasone (ELOCON) 0.1 % cream Apply 1 application topically daily as needed (for rash). Reported on 05/25/2016  3  . nitroGLYCERIN (NITROSTAT) 0.4 MG SL tablet Place 1 tablet (0.4 mg total) under the tongue every 5 (five) minutes x 3 doses as needed for chest pain. (Patient not taking: Reported on 12/07/2016) 25 tablet 2  .  prochlorperazine (COMPAZINE) 10 MG tablet Take 1 tablet (10 mg total) by mouth every 6 (six) hours as needed for nausea or vomiting. (Patient not taking: Reported on 12/07/2016) 30 tablet 1   Current Facility-Administered Medications  Medication Dose Route Frequency Provider Last Rate Last Dose  . sodium chloride 0.9 % injection 10 mL  10 mL Intravenous PRN Eppie Gibson, MD        Physical Findings: The patient is in no acute distress. Patient is alert and oriented.  height is 5' 6.5" (1.689 m) and weight is 151 lb 12.8 oz (68.9 kg). His temperature is 98.4 F (36.9 C). His blood pressure is 108/60 and his pulse is 95. His oxygen saturation is 99%.  No significant changes. General: Alert and oriented, in no acute distress. HEENT: Extraocular movements are intact. Oropharynx and oral cavity are clear. Skin: The rash over the patient's hands is  still erythematous, dry. Musculoskeletal: symmetric strength and muscle tone throughout, with exception to left leg from prior knee replacement. Neurologic: Rapidly alternating movements are intact. No nystagmus. Finger to nose testing is intact. Psychiatric: Judgment and insight are intact. Affect is appropriate.  KPS = 80  100 - Normal; no complaints; no evidence of disease. 90   - Andrew to carry on normal activity; minor signs or symptoms of disease. 80   - Normal activity with effort; some signs or symptoms of disease. 67   - Cares for self; unable to carry on normal activity or to do active work. 60   - Requires occasional assistance, but is Andrew to care for most of his personal needs. 50   - Requires considerable assistance and frequent medical care. 9   - Disabled; requires special care and assistance. 71   - Severely disabled; hospital admission is indicated although death not imminent. 16   - Very sick; hospital admission necessary; active supportive treatment necessary. 10   - Moribund; fatal processes progressing rapidly. 0     -  Dead  Karnofsky DA, Abelmann [place], Craver LS and Burchenal [facility] [phone]) The use of the nitrogen mustards in the palliative treatment of carcinoma: with particular reference to bronchogenic carcinoma Cancer 1 [id number]   Lab Findings: Lab Results  Component Value Date   WBC 3.5 (L) 02/06/2017   HGB 14.7 02/06/2017   HCT 43.6 02/06/2017   MCV 104.6 (H) 02/06/2017   PLT 156 02/06/2017   Lab Results  Component Value Date   TSH 2.293 08/10/2016       Radiographic Findings: Mr [name] RU Contrast  Result Date: 04/05/2017 CLINICAL DATA:  71 y/o M; small cell lung cancer with brain metastasis. EXAM: MRI HEAD WITHOUT AND WITH CONTRAST TECHNIQUE: Multiplanar, multiecho pulse sequences of the brain and surrounding structures were obtained without and with intravenous contrast. CONTRAST:  26mL MULTIHANCE GADOBENATE DIMEGLUMINE 529 MG/ML IV SOLN COMPARISON:  02/04/2017 MRI of the head. FINDINGS: Brain: Enhancing intracranial masses  as follows: 1. Left parietal lobe, increased in size, 12 x 8 mm, previously 9 mm, series 10: Image 104. 2. Left caudate head, increased in size, 11 x 7 mm, previously 9 mm, 10:98. 3. Left lateral temporal lobe, increased in size, 7 mm, previously 4 mm, 10:87. 4. Left cerebellar hemisphere, increased in size, 24 x 23 mm, previously 17 mm, 10:33. Previous identified punctate focus of enhancement in right parietal lobe with not identified. Vasogenic edema and local mass effect associated with intracranial metastasis is increased. No new focus of abnormal enhancement identified. Stable foci of susceptibility hypointensity within left caudate head, left orbital frontal cortex, and left parietal cortex compatible with treated metastasis. Increase susceptibility within the left cerebellar mass indicating interval hemorrhage. No reduced diffusion in a pattern to suggest acute or early subacute infarction. Stable background of T2 FLAIR hyperintense signal abnormality in subcortical and  periventricular white matter probably representing a combination of posttreatment changes and microvascular ischemic changes. Vascular: Normal flow voids. Skull and upper cervical spine: Normal marrow signal. Sinuses/Orbits: Trace right mastoid air cell effusion. Mild anterior ethmoid mucosal thickening. Otherwise no abnormal signal of paranasal sinuses or mastoid air cells. Orbits are unremarkable. Other: None. IMPRESSION: 1. Intracranial metastasis are increased in size from the prior MRI. 2. No new focus of abnormal enhancement to suggest interval metastatic disease. Electronically Signed   By: Kristine Garbe M.D.   On: 04/05/2017 16:13     Impression/Plan: Metastatic disease to the brain, status post salvage SRS RT.   MRI of the brain on 04/05/17 showed numerous intracranial metastases that have increased in size from the prior MRI from 02/04/17 and no new focus of abnormal enhancement to suggest interval metastatic disease. 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  The patient's headaches are chronic and are not severe enough to affect the quality of his life. In that regard, I do NOT recommended steroids at this time given their side effects. If his headaches become worse, his speech becomes slurred, vision changes, losing balance, etc, then steroids may be warranted. I will prescribe Trental and Vitamin E to the [facility] on [facility] for his radionecrosis.  Imodium AD prn diarrhea  I will tentatively schedule a surveillance brain MRI in ~2 months before he sees [facility] and I will coordinate with Mont Dutton regarding the recommendation for this from tumor board. _____________________________________   Eppie Gibson, MD  This document serves as a record of services personally performed by Eppie Gibson, MD. It was created on her behalf by Darcus Austin, a trained medical scribe. The creation of this record is based on the scribe's  personal observations and the provider's statements to them. This document has been checked and approved by the attending provider.

## 2017-04-10 NOTE — Progress Notes (Signed)
Andrew Shaw is here for follow up of radiation to his Brain on 03/21/16 and SRS treatment to 4 lesions on 10/31/16. He reports pain to his back which is chronic. He is taking oxycontin and hydrocodone for this pain with some relief. He had an MRI of his Brain 04/05/17. He reports consistent headaches to the right side of his head. He feels weak and staggers at times but tells me that this is due to back pain. He reports a decreased appetite, and is not eating well. He tells me that he has stopped eating meat because it just does not appeal to him anymore. He tells me that he continues to have 1-2 diarrhea stools daily.   BP 108/60   Pulse 95   Temp 98.4 F (36.9 C)   Ht 5' 6.5" (1.689 m)   Wt 151 lb 12.8 oz (68.9 kg)   SpO2 99% Comment: room air  BMI 24.13 kg/m    Wt Readings from Last 3 Encounters:  04/10/17 151 lb 12.8 oz (68.9 kg)  02/11/17 152 lb (68.9 kg)  12/07/16 152 lb 3.2 oz (69 kg)

## 2017-04-12 ENCOUNTER — Other Ambulatory Visit: Payer: Self-pay | Admitting: Radiation Oncology

## 2017-04-12 DIAGNOSIS — C7931 Secondary malignant neoplasm of brain: Secondary | ICD-10-CM

## 2017-04-12 MED ORDER — PENTOXIFYLLINE ER 400 MG PO TBCR: EXTENDED_RELEASE_TABLET | ORAL | 5 refills | Status: DC

## 2017-04-12 MED ORDER — VITAMIN E 180 MG (400 UNIT) PO CAPS: ORAL_CAPSULE | ORAL | 5 refills | Status: AC

## 2017-04-22 ENCOUNTER — Telehealth: Payer: Self-pay

## 2017-04-22 NOTE — Addendum Note (Signed)
Encounter addended by: Caelum Federici, Stephani Police, RN on: 04/22/2017 12:35 PM<BR>    Actions taken: Order Reconciliation Section accessed

## 2017-04-22 NOTE — Telephone Encounter (Signed)
[name] called me today. He reports an adverse reaction from the Trental prescribed by Dr. Isidore Moos. He reports extreme fatigue, vomiting, rash, and profuse sweating. He stopped taking this medicine. I notified Dr. Isidore Moos of the reaction. She verified it was ok to stop taking the Trental, but to continue taking his vitamin E pill. [name] verbalized his understanding. I have added it to his allergy list and removed it from his medication list. He knows to call me if he has any further questions.

## 2017-04-22 NOTE — Addendum Note (Signed)
Encounter addended by: Koven Belinsky, Stephani Police, RN on: 04/22/2017 12:21 PM<BR>    Actions taken: Allergies modified, Order Reconciliation Section accessed

## 2017-04-24 ENCOUNTER — Emergency Department (HOSPITAL_COMMUNITY): Payer: Medicare Other

## 2017-04-24 ENCOUNTER — Encounter (HOSPITAL_COMMUNITY): Payer: Self-pay | Admitting: Emergency Medicine

## 2017-04-24 ENCOUNTER — Telehealth: Payer: Self-pay

## 2017-04-24 ENCOUNTER — Emergency Department (HOSPITAL_COMMUNITY)
Admission: EM | Admit: 2017-04-24 | Discharge: 2017-04-24 | Disposition: A | Discharge status: Home / Self Care | Payer: Medicare Other | Attending: Emergency Medicine | Admitting: Emergency Medicine

## 2017-04-24 DIAGNOSIS — I10 Essential (primary) hypertension: Secondary | ICD-10-CM | POA: Diagnosis not present

## 2017-04-24 DIAGNOSIS — Z85118 Personal history of other malignant neoplasm of bronchus and lung: Secondary | ICD-10-CM | POA: Insufficient documentation

## 2017-04-24 DIAGNOSIS — Z79899 Other long term (current) drug therapy: Secondary | ICD-10-CM | POA: Diagnosis not present

## 2017-04-24 DIAGNOSIS — G936 Cerebral edema: Secondary | ICD-10-CM

## 2017-04-24 DIAGNOSIS — R112 Nausea with vomiting, unspecified: Secondary | ICD-10-CM | POA: Diagnosis present

## 2017-04-24 DIAGNOSIS — C7931 Secondary malignant neoplasm of brain: Secondary | ICD-10-CM | POA: Diagnosis not present

## 2017-04-24 DIAGNOSIS — Z85841 Personal history of malignant neoplasm of brain: Secondary | ICD-10-CM | POA: Diagnosis not present

## 2017-04-24 DIAGNOSIS — I2510 Atherosclerotic heart disease of native coronary artery without angina pectoris: Secondary | ICD-10-CM | POA: Insufficient documentation

## 2017-04-24 DIAGNOSIS — J449 Chronic obstructive pulmonary disease, unspecified: Secondary | ICD-10-CM | POA: Insufficient documentation

## 2017-04-24 DIAGNOSIS — Z91040 Latex allergy status: Secondary | ICD-10-CM | POA: Insufficient documentation

## 2017-04-24 DIAGNOSIS — F17210 Nicotine dependence, cigarettes, uncomplicated: Secondary | ICD-10-CM | POA: Insufficient documentation

## 2017-04-24 LAB — CBC
HCT: 42.3 % (ref 39.0–52.0)
HEMOGLOBIN: 14.7 g/dL (ref 13.0–17.0)
MCH: 35.3 pg — ABNORMAL HIGH (ref 26.0–34.0)
MCHC: 34.8 g/dL (ref 30.0–36.0)
MCV: 101.4 fL — AB (ref 78.0–100.0)
PLATELETS: 144 10*3/uL — AB (ref 150–400)
RBC: 4.17 MIL/uL — AB (ref 4.22–5.81)
RDW: 12.7 % (ref 11.5–15.5)
WBC: 3.8 10*3/uL — AB (ref 4.0–10.5)

## 2017-04-24 LAB — URINALYSIS, ROUTINE W REFLEX MICROSCOPIC
Bilirubin Urine: NEGATIVE
Glucose, UA: NEGATIVE mg/dL
Hgb urine dipstick: NEGATIVE
Ketones, ur: NEGATIVE mg/dL
LEUKOCYTES UA: NEGATIVE
Nitrite: NEGATIVE
PROTEIN: NEGATIVE mg/dL
Specific Gravity, Urine: 1.004 — ABNORMAL LOW (ref 1.005–1.030)
pH: 7 (ref 5.0–8.0)

## 2017-04-24 LAB — COMPREHENSIVE METABOLIC PANEL
ALT: 10 U/L — AB (ref 17–63)
ANION GAP: 8 (ref 5–15)
AST: 20 U/L (ref 15–41)
Albumin: 4 g/dL (ref 3.5–5.0)
Alkaline Phosphatase: 56 U/L (ref 38–126)
BUN: 9 mg/dL (ref 6–20)
CHLORIDE: 107 mmol/L (ref 101–111)
CO2: 27 mmol/L (ref 22–32)
CREATININE: 0.87 mg/dL (ref 0.61–1.24)
Calcium: 9.5 mg/dL (ref 8.9–10.3)
Glucose, Bld: 125 mg/dL — ABNORMAL HIGH (ref 65–99)
Potassium: 3.8 mmol/L (ref 3.5–5.1)
Sodium: 142 mmol/L (ref 135–145)
Total Bilirubin: 0.8 mg/dL (ref 0.3–1.2)
Total Protein: 6.9 g/dL (ref 6.5–8.1)

## 2017-04-24 LAB — LIPASE, BLOOD: LIPASE: 22 U/L (ref 11–51)

## 2017-04-24 MED ORDER — NYSTATIN 100000 UNIT/ML MT SUSP: 5.0000 mL | Freq: Four times a day (QID) | OROMUCOSAL | Status: DC

## 2017-04-24 MED ORDER — DIPHENHYDRAMINE HCL 50 MG/ML IJ SOLN
25.0000 mg | Freq: Once | INTRAMUSCULAR | Status: AC
  Administered 2017-04-24: 25 mg via INTRAVENOUS
  Filled 2017-04-24: qty 1

## 2017-04-24 MED ORDER — NYSTATIN 100000 UNIT/ML MT SUSP
5.0000 mL | Freq: Once | OROMUCOSAL | Status: AC
  Administered 2017-04-24: 500000 [IU] via ORAL
  Filled 2017-04-24: qty 5

## 2017-04-24 MED ORDER — ONDANSETRON 4 MG PO TBDP: 4.0000 mg | ORAL_TABLET | Freq: Three times a day (TID) | ORAL | 0 refills | Status: AC | PRN

## 2017-04-24 MED ORDER — ONDANSETRON HCL 4 MG/2ML IJ SOLN: 4.0000 mg | Freq: Once | INTRAMUSCULAR | Status: DC

## 2017-04-24 MED ORDER — DEXAMETHASONE SODIUM PHOSPHATE 10 MG/ML IJ SOLN
10.0000 mg | Freq: Once | INTRAMUSCULAR | Status: AC
  Administered 2017-04-24: 10 mg via INTRAVENOUS
  Filled 2017-04-24: qty 1

## 2017-04-24 MED ORDER — NYSTATIN 100000 UNIT/ML MT SUSP: 500000.0000 [IU] | Freq: Four times a day (QID) | OROMUCOSAL | 1 refills | Status: AC

## 2017-04-24 MED ORDER — DEXAMETHASONE 4 MG PO TABS: 4.0000 mg | ORAL_TABLET | Freq: Four times a day (QID) | ORAL | 0 refills | Status: AC

## 2017-04-24 NOTE — ED Notes (Signed)
Bed: [facility]1 Expected date:  Expected time:  Means of arrival:  Comments: 

## 2017-04-24 NOTE — Telephone Encounter (Signed)
Mr. Novinger wife called today and spoke with Memory Dance RN in triage. She reported he had nausea and vomiting this week. He is unable to eat or drink and when he does he will vomit 30- 45 minutes later. He is also wobbly and dizzy when ambulating. He has low energy and is sleeping a lot. Juliann Pulse spoke with Dr. Earlie Server his medical oncologist and he recommended that Mr. Osterhout present to Emergency Room for evaluation for new brain metastasis and to treat dehydration. Juliann Pulse called Mr. Seeley wife and she requested the opinion of Dr. Isidore Moos because she had recently seen Mr. Stratmann for a follow up apppointment.  I spoke with Dr. Isidore Moos and she agrees with Dr. Lew Dawes recommendation. I called and spoke to Mrs. Bittick and relayed my conversation with Dr. Isidore Moos. She voiced her understanding and will bring Mr. Desa to the Emergency Room for evaluation. She knows to call me if she has any further questions.

## 2017-04-24 NOTE — ED Notes (Signed)
Patient given sprite.

## 2017-04-24 NOTE — ED Triage Notes (Signed)
Pt from home with complaints of nausea and emesis that began on Sunday. Pt has hx of brain and lung cancer. Pt has not had chemo since 2016 and last had radiation in March of 2018. Pt denies fever or chills. Pt has had 2 episodes of emesis, but denies diarrhea. Pt is a and o x 4

## 2017-04-24 NOTE — Telephone Encounter (Signed)
Pt has been sick and throwing up this week. Started Sunday. Unable to eat and when drink throw up 30-45 minutes later. He is wobbly and dizzy. Walking to bathroom he gets dizzy by time he gets there. Last BM Tuesday.   Vomiting 1 x today, 2 x yesterday, similar on Sunday and Monday. He stopped trental 6/9, d/t nausea and fatigue. He did not start vomiting until 6/10. He s/w Dr Pearlie Oyster nurse on 6/11 but no changes except to OK stopping trental. He is sitting in recliner and sleeping a lot. His energy is not improving.  He has not used his compazine b/c he only has one left and was holding onto it. Refill sent to pharmacy.   Also port has been painful since it was last flushed 5/30. Not red nor swollen. He was asking if it is appropriate to get it removed.  S/w Dr Julien Nordmann and he instructed pt should go to ER to work up if increased brain mets, and to tx dehydration.  Called wife back and she was saying he just saw Dr Isidore Moos and wife wants Dr Lanell Persons opinion before going to ED.

## 2017-04-24 NOTE — ED Provider Notes (Signed)
[facility] DEPT Provider Note   CSN: [id number] Arrival date & time: 04/24/17  1628     History   Chief Complaint Chief Complaint  Patient presents with  . Emesis    HPI Andrew Shaw is a 71 y.o. male.  HPI   71 year old male with extensive past medical history including lung cancer as well as brain cancer due to metastases, who presents with nausea and vomiting. The patient reportedly was recently on pentoxyfylline to improve swelling of his brain due to radiation. Over the last several days, he had to stop this as he felt like his throat was tightening. Since then, he has had progressively worsening difficulty walking, nausea, and vomiting. He states he gets acute onset of dizziness and a sensation of motion sickness followed by nausea and vomiting. These episodes seem to come and go but her increasingly frequent. He denies any associated tinnitus or changes in his hearing. The symptoms correlate with stopping his medication for swelling.  Past Medical History:  Diagnosis Date  . Arthritis   . Back pain 12/23/2012  . BPH (benign prostatic hyperplasia)   . Brain metastases ([place]) dx'd 02/2016  . Carpal tunnel syndrome   . Carpal tunnel syndrome, right   . Chronic back pain   . COPD (chronic obstructive pulmonary disease) ([place])   . Coronary artery disease    2D ECHO, 10/31/2010 - EF >55%, normal; NUCLEAR STRESS TEST, 10/23/2010 - perfusion defect in inferior myocardial region, post-stress EF 62%, EKG negative for ischemia  . DVT (deep venous thrombosis) ([place])    history 2004 after knee surg  . GERD (gastroesophageal reflux disease)   . History of radiation therapy 10/31/2016   SRS radiation, Left anterior parietal, Left cerebellar, Left temporal, Left Caudate (each 20 Gy in 1 fraction)  . Hx of radiation therapy 03/06/16- 03/21/2016   Whole Brain and Right Hil[place]  . Hyperlipemia   . Hypertension   . Lung cancer ([place]) dx'd 07/2015  . 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 Neuromuscular disorder ([place])   . 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 S/P angioplasty with stent, BMS to LCX  12/23/12 12/23/2012  . Shortness of breath    with exertion  . Skin rash 08/22/2015    Patient Active Problem List   Diagnosis Date Noted  . Port catheter in place 05/25/2016  . Brain metastases ([place]) 05/25/2016  . Diarrhea 05/21/2016  . Rash 05/21/2016  . Anorexia 05/21/2016  . Thrombocytopenia ([place]) 05/21/2016  . Chest crackles 01/12/2016  . COPD (chronic obstructive pulmonary disease) ([place]) 11/11/2015  . Dehydration 11/10/2015  . 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 Other emphysema ([place]) 07/11/2015  . ILD (interstitial lung disease) ([place]) 07/11/2015  . Smoking history 05/06/2015  . Mediastinal adenopathy 05/06/2015  . Pain in the chest   . Carpal tunnel syndrome of right wrist, may need surgery in near future 10/26/2013  . S/P angioplasty with stent, BMS to LCX  12/23/12 12/23/2012  . Back pain, followed at [facility] pain clinic 12/23/2012  . CAD (coronary artery disease), cath 2005 with non obstructive disease, now 12/2102 with LCX stenosis culprit vessel 12/22/2012  . Unstable angina ([place]) 12/22/2012  . Dyslipidemia 12/22/2012  . HTN (hypertension) 12/22/2012  . Tobacco abuse 12/22/2012    Past Surgical History:  Procedure Laterality Date  . BACK SURGERY  2004   lumb fusion  . CARDIAC CATHETERIZATION  12/23/2012   Mid nondominant AV groove circumflex  stented with a 2.5x40mm Mini Vision stent resulting in a reduction of 90% stenosis to 0% residual  . CARDIAC CATHETERIZATION  02/07/2004   Noncritical CAD, continue medical therapy  . CARDIAC CATHETERIZATION  02/03/1999   Recommended medical therapy  . CARPAL TUNNEL RELEASE  04/15/2012   Procedure: CARPAL TUNNEL RELEASE;  Surgeon: Wynonia Sours, MD;  Location: [facility];  Service: Orthopedics;  Laterality: Left;  . CERVICAL FUSION  1999  . COLONOSCOPY W/ POLYPECTOMY    .  EPIDURAL BLOCK INJECTION     multiple lumbar  . HERNIA REPAIR  2008   umb   . JOINT REPLACEMENT Left 2008   lt total knee  . KNEE ARTHROSCOPY  04,06   left  . LEFT HEART CATHETERIZATION WITH CORONARY ANGIOGRAM N/A 12/23/2012   Procedure: LEFT HEART CATHETERIZATION WITH CORONARY ANGIOGRAM;  Surgeon: Lorretta Harp, MD;  Location: [facility] CATH LAB;  Service: Cardiovascular;  Laterality: N/A;  . LEFT HEART CATHETERIZATION WITH CORONARY ANGIOGRAM N/A 01/11/2015   Procedure: LEFT HEART CATHETERIZATION WITH CORONARY ANGIOGRAM;  Surgeon: Peter M Martinique, MD;  Location[facility] CATH LAB;  Service: Cardiovascular;  Laterality: N/A;  . MANIPULATION KNEE JOINT Left 2009   closed lt knee   . PERCUTANEOUS CORONARY STENT INTERVENTION (PCI-S)  12/23/2012   Procedure: PERCUTANEOUS CORONARY STENT INTERVENTION (PCI-S);  Surgeon: Lorretta Harp, MD;  Location: [facility] CATH LAB;  Service: Cardiovascular;;  . TRIGGER FINGER RELEASE  04/15/2012   Procedure: RELEASE TRIGGER FINGER/A-1 PULLEY;  Surgeon: Wynonia Sours, MD;  Location: [facility];  Service: Orthopedics;  Laterality: Left;  left thumb and little finger  . VIDEO BRONCHOSCOPY WITH ENDOBRONCHIAL ULTRASOUND N/A 07/28/2015   Procedure: VIDEO BRONCHOSCOPY WITH ENDOBRONCHIAL ULTRASOUND;  Surgeon: [name], MD;  Location: [facility];  Service: Thoracic;  Laterality: N/A;       Home Medications    Prior to Admission medications   Medication Sig Start Date End Date Taking? Authorizing Provider  aspirin EC 81 MG tablet Take 81 mg by mouth daily.   Yes [provider]  fenofibrate (TRICOR) 48 MG tablet Take 48 mg by mouth daily.   Yes [provider]  HYDROcodone-acetaminophen (NORCO) 10-325 MG per tablet Take 1 tablet by mouth every 4 (four) hours as needed (breakthrough pain).    Yes [provider]  irbesartan (AVAPRO) 150 MG tablet TAKE 1 TABLET(150 MG) BY MOUTH DAILY 04/23/16  Yes Lorretta Harp, MD  lidocaine-prilocaine  (EMLA) cream Apply one application to port a cath 1-2 hours prior to access. 10/18/15  Yes Curt Bears, MD  metoprolol (LOPRESSOR) 50 MG tablet Take 1 tablet (50 mg total) by mouth 2 (two) times daily. PLEASE CONTACT OFFICE FOR ADDITIONAL REFILLS 06/19/16  Yes Lorretta Harp, MD  mometasone (ELOCON) 0.1 % cream Apply 1 application topically daily as needed (for rash). Reported on 05/25/2016 06/24/15  Yes [provider]  nitroGLYCERIN (NITROSTAT) 0.4 MG SL tablet Place 1 tablet (0.4 mg total) under the tongue every 5 (five) minutes x 3 doses as needed for chest pain. 12/24/12  Yes Kilroy, Luke K, PA-C  OXYCONTIN 20 MG T12A 12 hr tablet Take 20 mg by mouth every 8 (eight) hours. 12/28/14  Yes [provider]  rosuvastatin (CRESTOR) 20 MG tablet Take 20 mg by mouth daily.   Yes [provider]  triamcinolone cream (KENALOG) 0.5 % Reported on 05/25/2016 05/08/16  Yes [provider]  vitamin E (VITAMIN E) 400 UNIT capsule  Take 1 cap PO daily  x 1 week, then 1 cap PO BID 04/12/17  Yes Eppie Gibson, MD  dexamethasone (DECADRON) 4 MG tablet Take 1 tablet (4 mg total) by mouth 4 (four) times daily. Take one tablet four times daily for 2 days, then three times daily for 2 days, then twice a day until follow-up 04/24/17 05/01/17  Duffy Bruce, MD  diphenoxylate-atropine (LOMOTIL) 2.5-0.025 MG tablet Take 2 tablets by mouth 4 (four) times daily as needed for diarrhea or loose stools. Patient not taking: Reported on 12/07/2016 05/17/16   Susanne Borders, NP  LORazepam (ATIVAN) 1 MG tablet Take 1 tablet (1 mg total) by mouth at bedtime. Patient not taking: Reported on 12/07/2016 02/28/16   Thea Silversmith, MD  metoprolol (LOPRESSOR) 50 MG tablet TAKE 1 TABLET BY MOUTH TWICE DAILY Patient not taking: Reported on 04/24/2017 10/29/16   Lorretta Harp, MD  nystatin (MYCOSTATIN) 100000 UNIT/ML suspension Take 5 mLs (500,000 Units total) by mouth 4 (four) times daily. 04/24/17 05/01/17   Duffy Bruce, MD  ondansetron (ZOFRAN ODT) 4 MG disintegrating tablet Take 1 tablet (4 mg total) by mouth every 8 (eight) hours as needed for nausea or vomiting. 04/24/17   Duffy Bruce, MD  pentoxifylline (TRENTAL) 400 MG CR tablet Take 400mg  PO daily  x 1 week, then 400mg  PO BID Patient not taking: Reported on 04/24/2017 04/12/17   Eppie Gibson, MD  prochlorperazine (COMPAZINE) 10 MG tablet Take 1 tablet (10 mg total) by mouth every 6 (six) hours as needed for nausea or vomiting. Patient not taking: Reported on 12/07/2016 08/16/15   Curt Bears, MD    Family History Family History  Problem Relation Age of Onset  . Heart disease Mother   . Hypertension Sister   . Diabetes Sister   . Cancer Brother     Social History Social History  Substance Use Topics  . Smoking status: Current Some Day Smoker    Packs/day: 0.25    Years: 50.00    Types: Cigarettes  . Smokeless tobacco: Never Used     Comment: 1 pack will last pt 2 days//ee 3.2.17  . Alcohol use No     Allergies   Cortisone; Dronabinol; Latex; Lisinopril; Trental [pentoxifylline]; Antihistamines, chlorpheniramine-type; and Flexeril [cyclobenzaprine]   Review of Systems Review of Systems  Constitutional: Positive for fatigue.  Gastrointestinal: Positive for nausea and vomiting.  Neurological: Positive for weakness and light-headedness.  All other systems reviewed and are negative.    Physical Exam Updated Vital Signs BP 135/83   Pulse 81   Temp 98.3 F (36.8 C) (Oral)   Resp 15   SpO2 100%   Physical Exam  Constitutional: He is oriented to person, place, and time. He appears well-developed and well-nourished. No distress.  HENT:  Head: Normocephalic and atraumatic.  Dry MM. Moderate posterior pharyngeal erythema with white plaques surrounded by mild erythema on the tongue and soft palate.  Eyes: Conjunctivae are normal.  Neck: Neck supple.  Cardiovascular: Normal rate, regular rhythm and normal heart  sounds.  Exam reveals no friction rub.   No murmur heard. Pulmonary/Chest: Effort normal and breath sounds normal. No respiratory distress. He has no wheezes. He has no rales.  Abdominal: He exhibits no distension.  Musculoskeletal: He exhibits no edema.  Neurological: He is alert and oriented to person, place, and time. He exhibits normal muscle tone.  Drowsy but able to participate in interview. Face is symmetric. Strength 55 in bilateral upper and lower extremities. Extraocular  movements are intact with no nystagmus. Normal FTN in UE bilaterally.   Skin: Skin is warm. Capillary refill takes less than 2 seconds.  Psychiatric: He has a normal mood and affect.  Nursing note and vitals reviewed.    ED Treatments / Results  Labs (all labs ordered are listed, but only abnormal results are displayed) Labs Reviewed  COMPREHENSIVE METABOLIC PANEL - Abnormal; Notable for the following:       Result Value   Glucose, Bld 125 (*)    ALT 10 (*)    All other components within normal limits  CBC - Abnormal; Notable for the following:    WBC 3.8 (*)    RBC 4.17 (*)    MCV 101.4 (*)    MCH 35.3 (*)    Platelets 144 (*)    All other components within normal limits  URINALYSIS, ROUTINE W REFLEX MICROSCOPIC - Abnormal; Notable for the following:    Specific Gravity, Urine 1.004 (*)    All other components within normal limits  LIPASE, BLOOD    EKG  EKG Interpretation None       Radiology Ct Head Wo Contrast  Result Date: 04/24/2017 CLINICAL DATA:  Nausea and vomiting. Lung cancer with known brain metastases. EXAM: CT HEAD WITHOUT CONTRAST TECHNIQUE: Contiguous axial images were obtained from the base of the skull through the vertex without intravenous contrast. COMPARISON:  Brain MRI 04/05/2017 FINDINGS: Brain: There are multiple areas of mild vasogenic edema corresponding to the contrast-enhancing lesions demonstrated on the prior MRI. These are located at the left caudate head, left  parietal lobe, left parietotemporal junction and left cerebellum. There is no midline shift or other significant mass effect. No acute hemorrhage. There is confluent periventricular white matter hypoattenuation, possibly due to prior radiation or chronic microvascular ischemia. Vascular: No hyperdense vessel or unexpected calcification. Skull: Normal visualized skull base, calvarium and extracranial soft tissues. Sinuses/Orbits: No sinus fluid levels or advanced mucosal thickening. No mastoid effusion. Normal orbits. IMPRESSION: 1. Multifocal vasogenic edema corresponding to the locations of multiple contrast-enhancing lesions on the prior brain MRI. No midline shift or other significant mass effect. 2. No acute hemorrhage or evidence of acute cortical infarct. 3. No new lesions sites are identified on this study, but MRI of the brain with and without contrast would provide better comparison, if clinically indicated. Electronically Signed   By: Ulyses Jarred M.D.   On: 04/24/2017 18:53    Procedures Procedures (including critical care time)  Medications Ordered in ED Medications  dexamethasone (DECADRON) injection 10 mg (10 mg Intravenous Given 04/24/17 1946)  diphenhydrAMINE (BENADRYL) injection 25 mg (25 mg Intravenous Given 04/24/17 1946)  nystatin (MYCOSTATIN) 100000 UNIT/ML suspension 500,000 Units (500,000 Units Oral Given 04/24/17 2031)     Initial Impression / Assessment and Plan / ED Course  I have reviewed the triage vital signs and the nursing notes.  Pertinent labs & imaging results that were available during my care of the patient were reviewed by me and considered in my medical decision making (see chart for details).     71 yo M with h/o brain mets here with nausea/vomiting and dizziness after stopping med for his brain mets due to allergic rxn. Suspect he has symptoms 2/2 vasogenic edema around known brain mets, worsened after taking meds. No focal neuro deficits, no signs of  herniation. CT scan shows significant vasogenic edema but no midline shift, no herniation. Labs are o/w reassuring/near baseline. He does incidentally have thrush, likely 2/2 his  chemo/chronic immune suppression, but no signs of significant esophagitis or other complication. Discussed with Dr. Alvy Bimler of Oncology. Will start on Decadron, benadryl PRn as he has had itching with this in the past, and d/c home on scheduled dexamethasone and outpt follow-up. Pt is tolerating PO, is well appearing on repeat exam, with no signs of allergic rxn to steroids. He would like to return home and I feel this is reasonable. Return precautions given.  Final Clinical Impressions(s) / ED Diagnoses   Final diagnoses:  Vasogenic brain edema [facility])    New Prescriptions Discharge Medication List as of 04/24/2017  8:54 PM    START taking these medications   Details  dexamethasone (DECADRON) 4 MG tablet Take 1 tablet (4 mg total) by mouth 4 (four) times daily. Take one tablet four times daily for 2 days, then three times daily for 2 days, then twice a day until follow-up, Starting Wed 04/24/2017, Until Wed 05/01/2017, Print    nystatin (MYCOSTATIN) 100000 UNIT/ML suspension Take 5 mLs (500,000 Units total) by mouth 4 (four) times daily., Starting Wed 04/24/2017, Until Wed 05/01/2017, Print    ondansetron (ZOFRAN ODT) 4 MG disintegrating tablet Take 1 tablet (4 mg total) by mouth every 8 (eight) hours as needed for nausea or vomiting., Starting Wed 04/24/2017, Print         Duffy Bruce, MD 04/25/17 [phone]

## 2017-04-24 NOTE — ED Notes (Signed)
Pt is aware of Urinalysis.

## 2017-04-24 NOTE — ED Notes (Signed)
Patient transported to CT 

## 2017-04-24 NOTE — ED Notes (Signed)
ED Provider at bedside. 

## 2017-05-08 ENCOUNTER — Other Ambulatory Visit: Payer: Self-pay | Admitting: Radiation Therapy

## 2017-05-08 DIAGNOSIS — C7931 Secondary malignant neoplasm of brain: Secondary | ICD-10-CM

## 2017-05-10 ENCOUNTER — Ambulatory Visit (HOSPITAL_BASED_OUTPATIENT_CLINIC_OR_DEPARTMENT_OTHER): Payer: Medicare Other

## 2017-05-10 VITALS — BP 120/67 | HR 66 | Temp 98.0°F | Resp 16

## 2017-05-10 DIAGNOSIS — C3490 Malignant neoplasm of unspecified part of unspecified bronchus or lung: Secondary | ICD-10-CM | POA: Diagnosis not present

## 2017-05-10 DIAGNOSIS — Z452 Encounter for adjustment and management of vascular access device: Secondary | ICD-10-CM

## 2017-05-10 DIAGNOSIS — Z95828 Presence of other vascular implants and grafts: Secondary | ICD-10-CM

## 2017-05-10 MED ORDER — SODIUM CHLORIDE 0.9 % IJ SOLN
10.0000 mL | INTRAMUSCULAR | Status: DC | PRN
  Administered 2017-05-10: 10 mL via INTRAVENOUS
  Filled 2017-05-10: qty 10

## 2017-05-10 MED ORDER — HEPARIN SOD (PORK) LOCK FLUSH 100 UNIT/ML IV SOLN
500.0000 [IU] | Freq: Once | INTRAVENOUS | Status: AC | PRN
  Administered 2017-05-10: 500 [IU] via INTRAVENOUS
  Filled 2017-05-10: qty 5

## 2017-05-10 NOTE — Patient Instructions (Signed)

## 2017-05-13 NOTE — Progress Notes (Signed)
Location/Histology of Brain Tumor:  Small cell lung cancer diagnosed in September 2016  MRI Brain 05/17/17  Patient presented with symptoms of:  He denies  Past or anticipated interventions, if any, per neurosurgery: N/A  Past or anticipated interventions, if any, per medical oncology: Small cell lung cancer diagnosed in September 2016 status post systemic chemotherapy with carboplatin and etoposide  Dr. Earlie Server is following. He is on observation at this time. His next appointment with Dr. Earlie Server is 08/13/17.   Dose of Decadron, if applicable:   Recent neurologic symptoms, if any:   Seizures: No  Headaches: No  Nausea: No  Dizziness/ataxia: No  Difficulty with hand coordination: No  Focal numbness/weakness: No  Visual deficits/changes: No  Confusion/Memory deficits: Yes, he has memory deficits. He has difficulty remembering what day it is. He also has short term memory deficits.   Painful bone metastases at present, if any:  SAFETY ISSUES:  Prior radiation? Yes 10/31/2016: PTV1 Left anterior parietal 12 mm was treated to 20 Gy in 1 fraction. PTV2 Left cerebellar 11 mm was treated to 20 Gy in 1 fraction. PTV3 Left temporal 6 mm was treated to 20 Gy in 1 fraction. PTV4 Left Caudate 6 mm was treated to 20 Gy in 1 fraction.  03/06/2016-03/21/2016: Whole brain and right hilum to 30 gy in 10 fractions at 3 gy per fraction   Pacemaker/ICD? No  Possible current pregnancy? N/A  Is the patient on methotrexate? No  Additional Complaints / other details:  MRI Brain 05/17/17  BP 121/69   Pulse 89   Temp 98.3 F (36.8 C)   Ht 5' 6.5" (1.689 m)   Wt 150 lb (68 kg)   SpO2 100% Comment: room air  BMI 23.85 kg/m    Wt Readings from Last 3 Encounters:  05/20/17 150 lb (68 kg)  04/10/17 151 lb 12.8 oz (68.9 kg)  02/11/17 152 lb (68.9 kg)

## 2017-05-14 NOTE — Progress Notes (Signed)
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 Brain metastases ([place]) C79.31     CHIEF COMPLAINT: Here for follow-up and surveillance of brain cancer  Interval Since Last Radiation:  6 months  10/31/2016: PTV1 Left anterior parietal 12 mm was treated to 20 Gy in 1 fraction. PTV2 Left cerebellar 11 mm was treated to 20 Gy in 1 fraction. PTV3 Left temporal 6 mm was treated to 20 Gy in 1 fraction. PTV4 Left Caudate 6 mm was treated to 20 Gy in 1 fraction.  03/06/2016-03/21/2016: Whole brain and right hilum to 30 Gy in 10 fractions at 3 Gy per fraction.   Narrative:  The patient returns today for routine follow-up and review of recent imaging.     Most recent MRI of the brain on 05/17/17 demonstrates a new right cerebellar metastasis which in retrospect is visible and smaller on previous MRI from May. There are 4 other lesions in the left side of the brain -- all of these were previously treated with SRS and show evidence of treatment effects per tumor board discussion this AM.  Symptomatically, denies current headaches, nausea, new neurologic deficits, visual changes.    Patient complains of memory changes; and, he reports an episode of all extremities jerking in bed last month following discharge from the hospital.  He didn't lose consciousness or continence. No one else observed the episode which was solitary, unrepeated.  He feels cold "all the time"  Status of systemic disease per CAP CT scan in March demonstrates no evidence of progression or recurrence.  The patient will follow up with Dr. Julien Nordmann on 08/13/17.  ALLERGIES:  is allergic to cortisone;  dronabinol; latex; lisinopril; trental [pentoxifylline]; antihistamines, chlorpheniramine-type; and flexeril [cyclobenzaprine].  Meds: Current Outpatient Prescriptions  Medication Sig Dispense Refill  . aspirin EC 81 MG tablet Take 81 mg by mouth daily.    . fenofibrate (TRICOR) 48 MG tablet Take 48 mg by mouth daily.    Marland Kitchen HYDROcodone-acetaminophen (NORCO) 10-325 MG per tablet Take 1 tablet by mouth every 4 (four) hours as needed (breakthrough pain).     . irbesartan (AVAPRO) 150 MG tablet TAKE 1 TABLET(150 MG) BY MOUTH DAILY 30 tablet 0  . lidocaine-prilocaine (EMLA) cream Apply one application to port a cath 1-2 hours prior to access. 30 g 2  . metoprolol (LOPRESSOR) 50 MG tablet Take 1 tablet (50 mg total) by mouth 2 (two) times daily. PLEASE CONTACT OFFICE FOR ADDITIONAL REFILLS 30 tablet 0  . mometasone (ELOCON) 0.1 % cream Apply 1 application topically daily as needed (for rash). Reported on 05/25/2016  3  . nitroGLYCERIN (NITROSTAT) 0.4 MG SL tablet Place 1 tablet (0.4 mg total) under the tongue every 5 (five) minutes x 3 doses as needed for chest pain. 25 tablet 2  . OXYCONTIN 20 MG T12A 12 hr tablet Take 20 mg by mouth every 8 (eight) hours.  0  . rosuvastatin (CRESTOR) 20 MG tablet Take 20 mg by mouth daily.    Marland Kitchen triamcinolone cream (KENALOG) 0.5 % Reported on 05/25/2016  1  . vitamin E (VITAMIN E) 400 UNIT capsule Take 1 cap PO daily  x 1 week,  then 1 cap PO BID 60 capsule 5  . diphenoxylate-atropine (LOMOTIL) 2.5-0.025 MG tablet Take 2 tablets by mouth 4 (four) times daily as needed for diarrhea or loose stools. (Patient not taking: Reported on 12/07/2016) 30 tablet 0  . LORazepam (ATIVAN) 1 MG tablet Take 1 tablet (1 mg total) by mouth at bedtime. (Patient not taking: Reported on 12/07/2016) 30 tablet 1  . ondansetron (ZOFRAN ODT) 4 MG disintegrating tablet Take 1 tablet (4 mg total) by mouth every 8 (eight) hours as needed for nausea or vomiting. (Patient not taking: Reported on  05/20/2017) 20 tablet 0  . pentoxifylline (TRENTAL) 400 MG CR tablet Take 400mg  PO daily  x 1 week, then 400mg  PO BID (Patient not taking: Reported on 04/24/2017) 60 tablet 5  . prochlorperazine (COMPAZINE) 10 MG tablet Take 1 tablet (10 mg total) by mouth every 6 (six) hours as needed for nausea or vomiting. (Patient not taking: Reported on 12/07/2016) 30 tablet 1   Current Facility-Administered Medications  Medication Dose Route Frequency Provider Last Rate Last Dose  . sodium chloride 0.9 % injection 10 mL  10 mL Intravenous PRN Eppie Gibson, MD         Physical Findings: The patient is in no acute distress. Patient is alert and oriented.  height is 5' 6.5" (1.689 m) and weight is 150 lb (68 kg). His temperature is 98.3 F (36.8 C). His blood pressure is 121/69 and his pulse is 89. His oxygen saturation is 100%.   No significant changes. General: Alert and oriented, in no acute distress HEENT: Head is normocephalic. Extraocular movements are intact.   Skin:erythema and dryness of hands Musculoskeletal: symmetric strength and muscle tone throughout (4/5). Neurologic: Cranial nerves II through XII are grossly intact. No obvious focalities. Speech is fluent. Coordination is intact. Psychiatric: Judgment and insight are intact. Affect is blunted  Lab Findings: Lab Results  Component Value Date   WBC 3.8 (L) 04/24/2017   HGB 14.7 04/24/2017   HCT 42.3 04/24/2017   MCV 101.4 (H) 04/24/2017   PLT 144 (L) 04/24/2017   Lab Results  Component Value Date   TSH 2.293 08/10/2016    Radiographic Findings: Ct Head Wo Contrast  Result Date: 04/24/2017 CLINICAL DATA:  Nausea and vomiting. Lung cancer with known brain metastases. EXAM: CT HEAD WITHOUT CONTRAST TECHNIQUE: Contiguous axial images were obtained from the base of the skull through the vertex without intravenous contrast. COMPARISON:  Brain MRI 04/05/2017 FINDINGS: Brain: There are multiple areas of mild vasogenic edema corresponding to  the contrast-enhancing lesions demonstrated on the prior MRI. These are located at the left caudate head, left parietal lobe, left parietotemporal junction and left cerebellum. There is no midline shift or other significant mass effect. No acute hemorrhage. There is confluent periventricular white matter hypoattenuation, possibly due to prior radiation or chronic microvascular ischemia. Vascular: No hyperdense vessel or unexpected calcification. Skull: Normal visualized skull base, calvarium and extracranial soft tissues. Sinuses/Orbits: No sinus fluid levels or advanced mucosal thickening. No mastoid effusion. Normal orbits. IMPRESSION: 1. Multifocal vasogenic edema corresponding to the locations of multiple contrast-enhancing lesions on the prior brain MRI. No midline shift or other significant mass effect. 2. No acute hemorrhage or evidence of acute cortical infarct. 3. No new lesions sites are identified on this study, but MRI of the brain with and without contrast would provide better comparison, if clinically indicated. Electronically Signed   By: Ulyses Jarred M.D.   On: 04/24/2017 18:53   Mr Brain  W Wo Contrast  Result Date: 05/17/2017 CLINICAL DATA:  Small cell lung cancer with brain metastasis, follow-up evaluation. EXAM: MRI HEAD WITHOUT AND WITH CONTRAST TECHNIQUE: Multiplanar, multiecho pulse sequences of the brain and surrounding structures were obtained without and with intravenous contrast. CONTRAST:  82mL MULTIHANCE GADOBENATE DIMEGLUMINE 529 MG/ML IV SOLN COMPARISON:  MRI of the head Apr 05, 2017 and MRI of the head February 04, 2017 FINDINGS: INTRACRANIAL CONTENTS: 2 infratentorial and 3 supratentorial enhancing metastasis present, varying from stable to larger as follows: 2.6 x 2.5 cm LEFT cerebellar mass was 2.3 x 2.4 cm. 5 x 3 mm RIGHT cerebellar mass is now 5 x 8 mm. Stable LEFT caudate 7 x 11 mm metastasis. Stable LEFT parietal 9 x 12 mm metastasis. 10 x 9 mm LEFT temporal lobe metastasis was  7 x 5 mm. Faint susceptibility artifact within the metastasis is new, the difference of which may be technical. Slight associated reduced diffusion may be spurious or, seen with hypercellular tumor. T1 shortening cerebellar dentate nuclei associated with treatment related change. Overall similar associated vasogenic edema. Confluent supratentorial white matter FLAIR T2 hyperintensities. Old small RIGHT cerebellar versus treated metastasis. Ventricles and sulci are overall normal for patient's age. No midline shift. No abnormal extra-axial fluid collections or abnormal extra-axial enhancement. VASCULAR: Normal major intracranial vascular flow voids present at skull base. SKULL AND UPPER CERVICAL SPINE: No abnormal sellar expansion. No suspicious calvarial bone marrow signal. Generalized bright T1 bone marrow signal compatible with post radiation change. Craniocervical junction maintained. SINUSES/ORBITS: The mastoid air-cells and included paranasal sinuses are well-aerated.The included ocular globes and orbital contents are non-suspicious. OTHER: None. IMPRESSION: 1. Five intracranial metastasis, 3 of which demonstrate interval growth. No new metastasis. 2. Confluent supratentorial white matter FLAIR T2 hyperintensities most compatible with whole-brain radiation. Electronically Signed   By: Elon Alas M.D.   On: 05/17/2017 17:47    Impression/Plan:  I had a lengthy discussion with the patient and his family after reviewing his MRI results with them. I conveyed the consensus of the CNS tumor board from this AM - the panel and I recommend stereotactic radiosurgery to treat the area of disease in the right cerebellum  while sparing the rest of the brain parenchyma.  After lengthy discussion, the patient would like to proceed with palliative stereotactic radiosurgery to the progressive metastasis.  We discussed the risks, benefits, and side effects of this treatment. Side effects may include but not  necessarily be limited to: headache, fatigue, brain injury or inflammation.  No guarantees of treatment were given. A consent form was signed and placed in the patient's medical record. The patient was encouraged to ask questions that I answered to the best of my ability.   I recommended simulation today, and treatment in 4 days. He is enthusiastic about this, and will proceed.  Will order TSH for cold intolerance and neurology consult for ? Seizure episode last month.   I spent 20 minutes face to face with the patient and more than 50% of that time was spent in counseling and/or coordination of care. _____________________________________   Eppie Gibson, MD  This document serves as a record of services personally performed by Eppie Gibson, MD. It was created on her behalf by Maryla Morrow, a trained medical scribe. The creation of this record is based on the scribe's personal observations and the provider's statements to them. This document has been checked and approved by the attending provider.

## 2017-05-17 ENCOUNTER — Ambulatory Visit (HOSPITAL_COMMUNITY)
Admission: RE | Admit: 2017-05-17 | Discharge: 2017-05-17 | Disposition: A | Discharge status: Home / Self Care | Payer: Medicare Other | Source: Ambulatory Visit | Attending: Radiation Oncology | Admitting: Radiation Oncology

## 2017-05-17 DIAGNOSIS — Z959 Presence of cardiac and vascular implant and graft, unspecified: Secondary | ICD-10-CM | POA: Insufficient documentation

## 2017-05-17 DIAGNOSIS — C801 Malignant (primary) neoplasm, unspecified: Secondary | ICD-10-CM | POA: Diagnosis not present

## 2017-05-17 DIAGNOSIS — C7931 Secondary malignant neoplasm of brain: Secondary | ICD-10-CM | POA: Insufficient documentation

## 2017-05-17 DIAGNOSIS — C3490 Malignant neoplasm of unspecified part of unspecified bronchus or lung: Secondary | ICD-10-CM | POA: Diagnosis not present

## 2017-05-17 MED ORDER — GADOBENATE DIMEGLUMINE 529 MG/ML IV SOLN
20.0000 mL | Freq: Once | INTRAVENOUS | Status: AC | PRN
  Administered 2017-05-17: 15 mL via INTRAVENOUS

## 2017-05-20 ENCOUNTER — Ambulatory Visit
Admission: RE | Admit: 2017-05-20 | Discharge: 2017-05-20 | Disposition: A | Discharge status: Home / Self Care | Payer: Medicare Other | Source: Ambulatory Visit | Attending: Radiation Oncology | Admitting: Radiation Oncology

## 2017-05-20 ENCOUNTER — Encounter: Payer: Self-pay | Admitting: Radiation Oncology

## 2017-05-20 VITALS — BP 121/69 | HR 89 | Temp 98.3°F | Ht 66.5 in | Wt 150.0 lb

## 2017-05-20 DIAGNOSIS — C7931 Secondary malignant neoplasm of brain: Secondary | ICD-10-CM

## 2017-05-20 DIAGNOSIS — Z85118 Personal history of other malignant neoplasm of bronchus and lung: Secondary | ICD-10-CM | POA: Diagnosis not present

## 2017-05-20 DIAGNOSIS — C3490 Malignant neoplasm of unspecified part of unspecified bronchus or lung: Secondary | ICD-10-CM

## 2017-05-20 DIAGNOSIS — Z510 Encounter for antineoplastic radiation therapy: Secondary | ICD-10-CM | POA: Diagnosis not present

## 2017-05-20 DIAGNOSIS — C3491 Malignant neoplasm of unspecified part of right bronchus or lung: Secondary | ICD-10-CM | POA: Diagnosis not present

## 2017-05-20 DIAGNOSIS — Z08 Encounter for follow-up examination after completed treatment for malignant neoplasm: Secondary | ICD-10-CM | POA: Diagnosis not present

## 2017-05-20 DIAGNOSIS — Z79899 Other long term (current) drug therapy: Secondary | ICD-10-CM | POA: Diagnosis not present

## 2017-05-20 DIAGNOSIS — Z7982 Long term (current) use of aspirin: Secondary | ICD-10-CM | POA: Diagnosis not present

## 2017-05-20 DIAGNOSIS — Z888 Allergy status to other drugs, medicaments and biological substances status: Secondary | ICD-10-CM | POA: Diagnosis not present

## 2017-05-20 MED ORDER — HEPARIN SOD (PORK) LOCK FLUSH 100 UNIT/ML IV SOLN
500.0000 [IU] | Freq: Once | INTRAVENOUS | Status: AC
  Administered 2017-05-20: 500 [IU] via INTRAVENOUS

## 2017-05-20 MED ORDER — SODIUM CHLORIDE 0.9% FLUSH
10.0000 mL | Freq: Once | INTRAVENOUS | Status: AC
  Administered 2017-05-20: 10 mL via INTRAVENOUS

## 2017-05-20 NOTE — Progress Notes (Signed)
  Name: Andrew Shaw MRN: [id number]  Date: 05/20/2017  DOB: 24-Dec-1945  SIMULATION AND TREATMENT PLANNING NOTE    ICD-10-CM   1. Brain metastases [facility]) C79.31       Radiation Oncology         (336) [phone] ________________________________  Name: Andrew Shaw MRN: [id number]  Date: 05/20/2017  DOB: [birth year]  SIMULATION AND TREATMENT PLANNING NOTE  outpatient   DIAGNOSIS:     ICD-10-CM   1. Brain metastases ([place]) C79.31     NARRATIVE:  The patient was brought to the [facility].  Identity was confirmed.  All relevant records and images related to the planned course of therapy were reviewed.  The patient freely provided informed written consent to proceed with treatment after reviewing the details related to the planned course of therapy. The consent form was witnessed and verified by the simulation staff. Intravenous access was established for contrast administration. Then, the patient was set-up in a stable reproducible supine position for radiation therapy.  A relocatable thermoplastic stereotactic head frame was fabricated for precise immobilization.  CT images were obtained.  Surface markings were placed.  The CT images were loaded into the planning software and fused with the patient's targeting MRI scan.  Then the target and avoidance structures were contoured.  Treatment planning then occurred.  The radiation prescription was entered and confirmed.  I have requested 3D planning  I have requested a DVH of the following structures: Brain stem, brain, left eye, right eye, lenses, optic chiasm, target volumes, uninvolved brain, and normal tissue.    SPECIAL TREATMENT PROCEDURE:  The planned course of therapy using radiation constitutes a special treatment procedure. Special care is required in the management of this patient for the following reasons:  High dose per fraction requiring special monitoring for increased toxicities of treatment including daily imaging.   The special nature of the planned course of radiotherapy will require increased physician supervision and oversight to ensure patient's safety with optimal treatment outcomes.  PLAN:  The patient will receive 20 Gy in 1 fraction to the right cerebellar lesion with stereotactic radiosurgery.  ________________________________    Eppie Gibson, MD

## 2017-05-20 NOTE — Progress Notes (Signed)
Has armband been applied?  Yes.    Does patient have an allergy to IV contrast dye?: No.   Has patient ever received premedication for IV contrast dye?: No.   Does patient take metformin?: No.  If patient does take metformin when was the last dose: {TN/A  Date of lab work: 04/24/17 BUN: 9 CR: 0.87  IV site: Right port a cath, power port  Has IV site been added to flowsheet?  Yes.

## 2017-05-20 NOTE — Addendum Note (Signed)
Encounter addended by: Doreen Beam, RN on: 05/20/2017 10:40 AM<BR>    Actions taken: Flowsheet accepted

## 2017-05-20 NOTE — Addendum Note (Signed)
Encounter addended by: Doreen Beam, RN on: 05/20/2017  9:20 AM<BR>    Actions taken: Pend clinical note, Order Reconciliation Section accessed, Sign clinical note, Flowsheet accepted

## 2017-05-20 NOTE — Addendum Note (Signed)
Encounter addended by: Doreen Beam, RN on: 05/20/2017 10:39 AM<BR>    Actions taken: Visit diagnoses modified, Order list changed, Diagnosis association updated, MAR administration accepted

## 2017-05-20 NOTE — Addendum Note (Signed)
Encounter addended by: Doreen Beam, RN on: 05/20/2017  9:21 AM<BR>    Actions taken: Visit diagnoses modified, Order list changed, Diagnosis association updated, MAR administration accepted

## 2017-05-21 ENCOUNTER — Telehealth: Payer: Self-pay | Admitting: *Deleted

## 2017-05-21 ENCOUNTER — Other Ambulatory Visit: Payer: Self-pay | Admitting: Radiation Oncology

## 2017-05-21 DIAGNOSIS — Z510 Encounter for antineoplastic radiation therapy: Secondary | ICD-10-CM | POA: Diagnosis not present

## 2017-05-21 DIAGNOSIS — Z79899 Other long term (current) drug therapy: Secondary | ICD-10-CM | POA: Diagnosis not present

## 2017-05-21 DIAGNOSIS — C3491 Malignant neoplasm of unspecified part of right bronchus or lung: Secondary | ICD-10-CM | POA: Diagnosis not present

## 2017-05-21 DIAGNOSIS — C7931 Secondary malignant neoplasm of brain: Secondary | ICD-10-CM | POA: Diagnosis not present

## 2017-05-21 DIAGNOSIS — R6889 Other general symptoms and signs: Secondary | ICD-10-CM

## 2017-05-21 DIAGNOSIS — Z7982 Long term (current) use of aspirin: Secondary | ICD-10-CM | POA: Diagnosis not present

## 2017-05-21 DIAGNOSIS — Z888 Allergy status to other drugs, medicaments and biological substances status: Secondary | ICD-10-CM | POA: Diagnosis not present

## 2017-05-21 NOTE — Telephone Encounter (Signed)
CALLED PATIENT TO INFORM OF LAB APPT. FOR 05-24-17 @ 10:30 AM, SPOKE WITH PATIENT AND HE IS AWARE OF THIS APPT.

## 2017-05-23 DIAGNOSIS — G894 Chronic pain syndrome: Secondary | ICD-10-CM | POA: Diagnosis not present

## 2017-05-23 DIAGNOSIS — M47812 Spondylosis without myelopathy or radiculopathy, cervical region: Secondary | ICD-10-CM | POA: Diagnosis not present

## 2017-05-23 DIAGNOSIS — M4726 Other spondylosis with radiculopathy, lumbar region: Secondary | ICD-10-CM | POA: Diagnosis not present

## 2017-05-23 DIAGNOSIS — Z79891 Long term (current) use of opiate analgesic: Secondary | ICD-10-CM | POA: Diagnosis not present

## 2017-05-24 ENCOUNTER — Other Ambulatory Visit: Payer: Self-pay | Admitting: Radiation Oncology

## 2017-05-24 ENCOUNTER — Ambulatory Visit
Admission: RE | Admit: 2017-05-24 | Discharge: 2017-05-24 | Disposition: A | Discharge status: Home / Self Care | Payer: Medicare Other | Source: Ambulatory Visit | Attending: Radiation Oncology | Admitting: Radiation Oncology

## 2017-05-24 ENCOUNTER — Telehealth: Payer: Self-pay

## 2017-05-24 ENCOUNTER — Encounter: Payer: Self-pay | Admitting: Radiation Oncology

## 2017-05-24 VITALS — BP 123/72 | HR 58 | Temp 97.6°F

## 2017-05-24 DIAGNOSIS — C7931 Secondary malignant neoplasm of brain: Secondary | ICD-10-CM | POA: Diagnosis not present

## 2017-05-24 DIAGNOSIS — C3491 Malignant neoplasm of unspecified part of right bronchus or lung: Secondary | ICD-10-CM | POA: Diagnosis not present

## 2017-05-24 DIAGNOSIS — Z888 Allergy status to other drugs, medicaments and biological substances status: Secondary | ICD-10-CM | POA: Diagnosis not present

## 2017-05-24 DIAGNOSIS — Z510 Encounter for antineoplastic radiation therapy: Secondary | ICD-10-CM | POA: Diagnosis not present

## 2017-05-24 DIAGNOSIS — Z7982 Long term (current) use of aspirin: Secondary | ICD-10-CM | POA: Diagnosis not present

## 2017-05-24 DIAGNOSIS — E038 Other specified hypothyroidism: Secondary | ICD-10-CM

## 2017-05-24 DIAGNOSIS — Z79899 Other long term (current) drug therapy: Secondary | ICD-10-CM | POA: Diagnosis not present

## 2017-05-24 DIAGNOSIS — C3490 Malignant neoplasm of unspecified part of unspecified bronchus or lung: Secondary | ICD-10-CM | POA: Diagnosis not present

## 2017-05-24 DIAGNOSIS — R6889 Other general symptoms and signs: Secondary | ICD-10-CM

## 2017-05-24 DIAGNOSIS — E039 Hypothyroidism, unspecified: Secondary | ICD-10-CM | POA: Insufficient documentation

## 2017-05-24 LAB — TSH: TSH: 5.376 m[IU]/L — AB (ref 0.320–4.118)

## 2017-05-24 MED ORDER — LEVOTHYROXINE SODIUM 25 MCG PO TABS: 25.0000 ug | ORAL_TABLET | Freq: Every day | ORAL | 6 refills | Status: AC

## 2017-05-24 NOTE — Op Note (Signed)
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 Eppie Shaw (radiation oncology) prior to treatment.  It showed 3-dimensional radiation distributions overlaid onto the planning CT/MRI image set.  The [facility] for the target structures as well as the organs at risk were reviewed. The documentation of the 3D plan and dosimetry are filed in the radiation oncology EMR.  NARRATIVE:  Andrew Shaw was brought to the TrueBeam stereotactic radiation treatment machine and placed supine on the CT couch. The head frame was applied, and the patient was set up for stereotactic radiosurgery.  I was present for the set-up and delivery.  SIMULATION VERIFICATION:  In the couch zero-angle position, the patient underwent Exactrac imaging using the Brainlab system with orthogonal KV images.  These were carefully aligned and repeated to confirm treatment position for each of the isocenters.  The Exactrac snap film verification was repeated at each couch angle.  SPECIAL TREATMENT PROCEDURE: Andrew Shaw received stereotactic radiosurgery to the following targets: Right cerebellar target (subcentimeter) was treated using 3 Dynamic Conformal Arcs to a prescription dose of 20 Gy.  ExacTrac registration was performed for each couch angle.  The 83.1% isodose line was prescribed.  STEREOTACTIC TREATMENT MANAGEMENT:  Following delivery, the patient was transported to nursing in stable condition and monitored for possible acute effects.  Vital signs were recorded. The patient tolerated treatment without significant acute effects,  and was discharged to home in stable condition.    PLAN: Follow-up in one month.

## 2017-05-24 NOTE — Telephone Encounter (Signed)
I called Mr. Titzer today at the direction of Dr. Isidore Moos. His recent Thyroid level testing showed his thyroid hormone levels to be a little sluggish. She wants him to start levothyroxine 25 mcg daily in the morning. He is to take the medicine without any type of vitamins or minerals 45-60 minutes before eating. I spoke with his wife and explained the above. I informed her that the medicine had been called into [facility] on [address]. She voiced her understanding and knows to call me if she has any further questions.

## 2017-05-24 NOTE — Progress Notes (Signed)
  Radiation Oncology         (336) [phone] ________________________________  Stereotactic Treatment Procedure Note Outpatient  Name: Andrew Shaw MRN: [id number]  Date: 05/24/2017  DOB: 1946/05/09  SPECIAL TREATMENT PROCEDURE Brain metastases ([place])    ICD-10-CM   1. Brain metastases ([employer]) C79.31     3D TREATMENT PLANNING AND DOSIMETRY:  The patient's radiation plan was reviewed and approved by neurosurgery and radiation oncology prior to treatment.  It showed 3-dimensional radiation distributions overlaid onto the planning CT/MRI image set.  The [facility] for the target structures as well as the organs at risk were reviewed. The documentation of the 3D plan and dosimetry are filed in the radiation oncology EMR.  NARRATIVE:  Andrew Shaw was brought to the TrueBeam stereotactic radiation treatment machine and placed supine on the CT couch. The head frame was applied, and the patient was set up for stereotactic radiosurgery.  Neurosurgery was present for the set-up and delivery  SIMULATION VERIFICATION:  In the couch zero-angle position, the patient underwent Exactrac imaging using the Brainlab system with orthogonal KV images.  These were carefully aligned and repeated to confirm treatment position for each of the isocenters.  The Exactrac snap film verification was repeated at each couch angle.  SPECIAL TREATMENT PROCEDURE: [name] received stereotactic radiosurgery to the following targets: Right cerebellar 40mm target was treated using 3 Dynamic Conformal Arcs to a prescription dose of 20 Gy.  ExacTrac Snap verification was performed for each couch angle.   This constitutes a special treatment procedure due to the ablative dose delivered and the technical nature of treatment.  This highly technical modality of treatment ensures that the ablative dose is centered on the patient's tumor while sparing normal tissues from excessive dose and risk of detrimental  effects.  STEREOTACTIC TREATMENT MANAGEMENT:  Following delivery, the patient was transported to nursing in stable condition and monitored for possible acute effects.  Vital signs were recorded BP 123/72   Pulse (!) 58   Temp 97.6 F (36.4 C)   SpO2 100% Comment: room air. The patient tolerated treatment without significant acute effects, and was discharged to home in stable condition.    PLAN: Follow-up in one month.  ________________________________   Eppie Gibson, MD

## 2017-06-03 ENCOUNTER — Emergency Department (HOSPITAL_COMMUNITY): Payer: Medicare Other

## 2017-06-03 ENCOUNTER — Inpatient Hospital Stay (HOSPITAL_COMMUNITY)
Admission: EM | Admit: 2017-06-03 | Discharge: 2017-06-06 | DRG: 054 | Disposition: A | Discharge status: Home / Self Care | Payer: Medicare Other | Attending: Internal Medicine | Admitting: Internal Medicine

## 2017-06-03 DIAGNOSIS — F17210 Nicotine dependence, cigarettes, uncomplicated: Secondary | ICD-10-CM | POA: Diagnosis present

## 2017-06-03 DIAGNOSIS — Z8249 Family history of ischemic heart disease and other diseases of the circulatory system: Secondary | ICD-10-CM

## 2017-06-03 DIAGNOSIS — Z923 Personal history of irradiation: Secondary | ICD-10-CM

## 2017-06-03 DIAGNOSIS — I2510 Atherosclerotic heart disease of native coronary artery without angina pectoris: Secondary | ICD-10-CM | POA: Diagnosis not present

## 2017-06-03 DIAGNOSIS — S299XXA Unspecified injury of thorax, initial encounter: Secondary | ICD-10-CM | POA: Diagnosis not present

## 2017-06-03 DIAGNOSIS — C7931 Secondary malignant neoplasm of brain: Secondary | ICD-10-CM | POA: Diagnosis not present

## 2017-06-03 DIAGNOSIS — G936 Cerebral edema: Secondary | ICD-10-CM | POA: Diagnosis present

## 2017-06-03 DIAGNOSIS — R112 Nausea with vomiting, unspecified: Secondary | ICD-10-CM | POA: Diagnosis not present

## 2017-06-03 DIAGNOSIS — E7220 Disorder of urea cycle metabolism, unspecified: Secondary | ICD-10-CM | POA: Diagnosis not present

## 2017-06-03 DIAGNOSIS — T380X6A Underdosing of glucocorticoids and synthetic analogues, initial encounter: Secondary | ICD-10-CM | POA: Diagnosis present

## 2017-06-03 DIAGNOSIS — E039 Hypothyroidism, unspecified: Secondary | ICD-10-CM | POA: Diagnosis present

## 2017-06-03 DIAGNOSIS — R7989 Other specified abnormal findings of blood chemistry: Secondary | ICD-10-CM | POA: Diagnosis not present

## 2017-06-03 DIAGNOSIS — C3490 Malignant neoplasm of unspecified part of unspecified bronchus or lung: Secondary | ICD-10-CM | POA: Diagnosis not present

## 2017-06-03 DIAGNOSIS — G9341 Metabolic encephalopathy: Secondary | ICD-10-CM | POA: Diagnosis present

## 2017-06-03 DIAGNOSIS — Z981 Arthrodesis status: Secondary | ICD-10-CM

## 2017-06-03 DIAGNOSIS — Y92013 Bedroom of single-family (private) house as the place of occurrence of the external cause: Secondary | ICD-10-CM

## 2017-06-03 DIAGNOSIS — E860 Dehydration: Secondary | ICD-10-CM | POA: Diagnosis present

## 2017-06-03 DIAGNOSIS — Z96652 Presence of left artificial knee joint: Secondary | ICD-10-CM | POA: Diagnosis present

## 2017-06-03 DIAGNOSIS — R531 Weakness: Secondary | ICD-10-CM | POA: Diagnosis not present

## 2017-06-03 DIAGNOSIS — S199XXA Unspecified injury of neck, initial encounter: Secondary | ICD-10-CM | POA: Diagnosis not present

## 2017-06-03 DIAGNOSIS — S0990XA Unspecified injury of head, initial encounter: Secondary | ICD-10-CM | POA: Diagnosis not present

## 2017-06-03 DIAGNOSIS — W1830XA Fall on same level, unspecified, initial encounter: Secondary | ICD-10-CM | POA: Diagnosis present

## 2017-06-03 DIAGNOSIS — I252 Old myocardial infarction: Secondary | ICD-10-CM

## 2017-06-03 DIAGNOSIS — M542 Cervicalgia: Secondary | ICD-10-CM | POA: Diagnosis present

## 2017-06-03 DIAGNOSIS — Z91128 Patient's intentional underdosing of medication regimen for other reason: Secondary | ICD-10-CM

## 2017-06-03 DIAGNOSIS — Z79899 Other long term (current) drug therapy: Secondary | ICD-10-CM

## 2017-06-03 DIAGNOSIS — Z79891 Long term (current) use of opiate analgesic: Secondary | ICD-10-CM

## 2017-06-03 DIAGNOSIS — G893 Neoplasm related pain (acute) (chronic): Secondary | ICD-10-CM | POA: Diagnosis present

## 2017-06-03 DIAGNOSIS — R404 Transient alteration of awareness: Secondary | ICD-10-CM | POA: Diagnosis not present

## 2017-06-03 DIAGNOSIS — Z888 Allergy status to other drugs, medicaments and biological substances status: Secondary | ICD-10-CM

## 2017-06-03 DIAGNOSIS — E785 Hyperlipidemia, unspecified: Secondary | ICD-10-CM | POA: Diagnosis present

## 2017-06-03 DIAGNOSIS — W19XXXA Unspecified fall, initial encounter: Secondary | ICD-10-CM

## 2017-06-03 DIAGNOSIS — Z91040 Latex allergy status: Secondary | ICD-10-CM

## 2017-06-03 DIAGNOSIS — Z7982 Long term (current) use of aspirin: Secondary | ICD-10-CM

## 2017-06-03 DIAGNOSIS — I10 Essential (primary) hypertension: Secondary | ICD-10-CM | POA: Diagnosis present

## 2017-06-03 DIAGNOSIS — R410 Disorientation, unspecified: Secondary | ICD-10-CM | POA: Diagnosis present

## 2017-06-03 LAB — CBC WITH DIFFERENTIAL/PLATELET
BASOS PCT: 0 %
Basophils Absolute: 0 10*3/uL (ref 0.0–0.1)
EOS ABS: 0 10*3/uL (ref 0.0–0.7)
EOS PCT: 0 %
HCT: 41.8 % (ref 39.0–52.0)
HEMOGLOBIN: 14.9 g/dL (ref 13.0–17.0)
Lymphocytes Relative: 7 %
Lymphs Abs: 0.8 10*3/uL (ref 0.7–4.0)
MCH: 34.8 pg — ABNORMAL HIGH (ref 26.0–34.0)
MCHC: 35.6 g/dL (ref 30.0–36.0)
MCV: 97.7 fL (ref 78.0–100.0)
Monocytes Absolute: 0.7 10*3/uL (ref 0.1–1.0)
Monocytes Relative: 7 %
NEUTROS PCT: 86 %
Neutro Abs: 9.1 10*3/uL — ABNORMAL HIGH (ref 1.7–7.7)
PLATELETS: 122 10*3/uL — AB (ref 150–400)
RBC: 4.28 MIL/uL (ref 4.22–5.81)
RDW: 13.2 % (ref 11.5–15.5)
WBC: 10.6 10*3/uL — AB (ref 4.0–10.5)

## 2017-06-03 LAB — BLOOD GAS, VENOUS
Acid-Base Excess: 0.5 mmol/L (ref 0.0–2.0)
BICARBONATE: 24.7 mmol/L (ref 20.0–28.0)
FIO2: 21
O2 Saturation: 85.2 %
PATIENT TEMPERATURE: 98.6
pCO2, Ven: 40.2 mmHg — ABNORMAL LOW (ref 44.0–60.0)
pH, Ven: 7.405 (ref 7.250–7.430)
pO2, Ven: 50.1 mmHg — ABNORMAL HIGH (ref 32.0–45.0)

## 2017-06-03 LAB — BASIC METABOLIC PANEL
ANION GAP: 9 (ref 5–15)
BUN: 9 mg/dL (ref 6–20)
CALCIUM: 9.2 mg/dL (ref 8.9–10.3)
CO2: 23 mmol/L (ref 22–32)
Chloride: 105 mmol/L (ref 101–111)
Creatinine, Ser: 0.85 mg/dL (ref 0.61–1.24)
GFR calc Af Amer: >60 mL/min (ref >60)
Glucose, Bld: 108 mg/dL — ABNORMAL HIGH (ref 65–99)
Potassium: 3.9 mmol/L (ref 3.5–5.1)
SODIUM: 137 mmol/L (ref 135–145)

## 2017-06-03 LAB — I-STAT CHEM 8, ED
BUN: 7 mg/dL (ref 6–20)
CHLORIDE: 103 mmol/L (ref 101–111)
Calcium, Ion: 1.12 mmol/L — ABNORMAL LOW (ref 1.15–1.40)
Creatinine, Ser: 0.7 mg/dL (ref 0.61–1.24)
Glucose, Bld: 105 mg/dL — ABNORMAL HIGH (ref 65–99)
HEMATOCRIT: 45 % (ref 39.0–52.0)
Hemoglobin: 15.3 g/dL (ref 13.0–17.0)
Potassium: 3.9 mmol/L (ref 3.5–5.1)
SODIUM: 137 mmol/L (ref 135–145)
TCO2: 24 mmol/L (ref 0–100)

## 2017-06-03 LAB — AMMONIA: Ammonia: 77 umol/L — ABNORMAL HIGH (ref 9–35)

## 2017-06-03 LAB — CBG MONITORING, ED: GLUCOSE-CAPILLARY: 114 mg/dL — AB (ref 65–99)

## 2017-06-03 MED ORDER — SODIUM CHLORIDE 0.9 % IV BOLUS (SEPSIS)
1000.0000 mL | Freq: Once | INTRAVENOUS | Status: AC
  Administered 2017-06-03: 1000 mL via INTRAVENOUS

## 2017-06-03 NOTE — ED Notes (Signed)
Took off pt's wet clothes and put them in patient belonging bag

## 2017-06-03 NOTE — ED Provider Notes (Signed)
[facility] DEPT Provider Note   CSN: [id number] Arrival date & time: 06/03/17  2056     History   Chief Complaint Chief Complaint  Patient presents with  . Nausea  . Fall  . Weakness    HPI Andrew Shaw is a 71 y.o. male.  The history is provided by the patient, medical records and a relative.  Emesis   This is a new problem. The current episode started 2 days ago. The problem occurs continuously. The problem has not changed since onset.There has been no fever. Associated symptoms include headaches. Pertinent negatives include no abdominal pain, no chills, no cough, no diarrhea, no fever, no sweats and no URI.    Past Medical History:  Diagnosis Date  . Arthritis   . Back pain 12/23/2012  . BPH (benign prostatic hyperplasia)   . Brain metastases ([place]) dx'd 02/2016  . Carpal tunnel syndrome   . Carpal tunnel syndrome, right   . Chronic back pain   . COPD (chronic obstructive pulmonary disease) ([place])   . Coronary artery disease    2D ECHO, 10/31/2010 - EF >55%, normal; NUCLEAR STRESS TEST, 10/23/2010 - perfusion defect in inferior myocardial region, post-stress EF 62%, EKG negative for ischemia  . DVT (deep venous thrombosis) ([place])    history 2004 after knee surg  . GERD (gastroesophageal reflux disease)   . History of radiation therapy 10/31/2016   SRS radiation, Left anterior parietal, Left cerebellar, Left temporal, Left Caudate (each 20 Gy in 1 fraction)  . Hx of radiation therapy 03/06/16- 03/21/2016   Whole Brain and Right [place]  . Hyperlipemia   . Hypertension   . Lung cancer ([place]) dx'd 07/2015  . Myocardial infarction ([place]) 2005   from steroids  . Neuromuscular disorder ([place])   . 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 S/P angioplasty with stent, BMS to LCX  12/23/12 12/23/2012  . Shortness of breath    with exertion  . Skin rash 08/22/2015    Patient Active Problem List   Diagnosis Date Noted  . Hypothyroidism 05/24/2017  . Port catheter in place  05/25/2016  . Brain metastases ([place]) 05/25/2016  . Diarrhea 05/21/2016  . Rash 05/21/2016  . Anorexia 05/21/2016  . Thrombocytopenia ([place]) 05/21/2016  . Chest crackles 01/12/2016  . COPD (chronic obstructive pulmonary disease) ([place]) 11/11/2015  . Dehydration 11/10/2015  . 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 Other emphysema ([place]) 07/11/2015  . ILD (interstitial lung disease) ([place]) 07/11/2015  . Smoking history 05/06/2015  . Mediastinal adenopathy 05/06/2015  . Pain in the chest   . Carpal tunnel syndrome of right wrist, may need surgery in near future 10/26/2013  . S/P angioplasty with stent, BMS to LCX  12/23/12 12/23/2012  . Back pain, followed at [facility] pain clinic 12/23/2012  . CAD (coronary artery disease), cath 2005 with non obstructive disease, now 12/2102 with LCX stenosis culprit vessel 12/22/2012  . Unstable angina ([place]) 12/22/2012  . Dyslipidemia 12/22/2012  . HTN (hypertension) 12/22/2012  . Tobacco abuse 12/22/2012    Past Surgical History:  Procedure Laterality Date  . BACK SURGERY  2004   lumb fusion  . CARDIAC CATHETERIZATION  12/23/2012   Mid nondominant AV groove circumflex stented with a 2.5x31mm Mini Vision stent resulting in a reduction of 90% stenosis to 0% residual  . CARDIAC CATHETERIZATION  02/07/2004   Noncritical CAD, continue medical therapy  . CARDIAC CATHETERIZATION  02/03/1999   Recommended medical therapy  .  CARPAL TUNNEL RELEASE  04/15/2012   Procedure: CARPAL TUNNEL RELEASE;  Surgeon: Wynonia Sours, MD;  Location: [facility];  Service: Orthopedics;  Laterality: Left;  . CERVICAL FUSION  1999  . COLONOSCOPY W/ POLYPECTOMY    . EPIDURAL BLOCK INJECTION     multiple lumbar  . HERNIA REPAIR  2008   umb   . JOINT REPLACEMENT Left 2008   lt total knee  . KNEE ARTHROSCOPY  04,06   left  . LEFT HEART CATHETERIZATION WITH CORONARY ANGIOGRAM N/A 12/23/2012   Procedure: LEFT HEART CATHETERIZATION WITH CORONARY ANGIOGRAM;   Surgeon: Lorretta Harp, MD;  Location: [facility] CATH LAB;  Service: Cardiovascular;  Laterality: N/A;  . LEFT HEART CATHETERIZATION WITH CORONARY ANGIOGRAM N/A 01/11/2015   Procedure: LEFT HEART CATHETERIZATION WITH CORONARY ANGIOGRAM;  Surgeon: Peter M Martinique, MD;  Location: [facility] CATH LAB;  Service: Cardiovascular;  Laterality: N/A;  . MANIPULATION KNEE JOINT Left 2009   closed lt knee   . PERCUTANEOUS CORONARY STENT INTERVENTION (PCI-S)  12/23/2012   Procedure: PERCUTANEOUS CORONARY STENT INTERVENTION (PCI-S);  Surgeon: Lorretta Harp, MD;  Location: [facility] CATH LAB;  Service: Cardiovascular;;  . TRIGGER FINGER RELEASE  04/15/2012   Procedure: RELEASE TRIGGER FINGER/A-1 PULLEY;  Surgeon: Wynonia Sours, MD;  Location: [facility];  Service: Orthopedics;  Laterality: Left;  left thumb and little finger  . VIDEO BRONCHOSCOPY WITH ENDOBRONCHIAL ULTRASOUND N/A 07/28/2015   Procedure: VIDEO BRONCHOSCOPY WITH ENDOBRONCHIAL ULTRASOUND;  Surgeon: [name], MD;  Location: [facility];  Service: Thoracic;  Laterality: N/A;       Home Medications    Prior to Admission medications   Medication Sig Start Date End Date Taking? Authorizing Provider  aspirin EC 81 MG tablet Take 81 mg by mouth daily.   Yes [provider]  diphenoxylate-atropine (LOMOTIL) 2.5-0.025 MG tablet Take 2 tablets by mouth 4 (four) times daily as needed for diarrhea or loose stools. 05/17/16  Yes Susanne Borders, NP  fenofibrate (TRICOR) 48 MG tablet Take 48 mg by mouth daily.   Yes [provider]  HYDROcodone-acetaminophen (NORCO) 10-325 MG per tablet Take 1 tablet by mouth every 4 (four) hours as needed (breakthrough pain).    Yes [provider]  irbesartan (AVAPRO) 150 MG tablet TAKE 1 TABLET(150 MG) BY MOUTH DAILY 04/23/16  Yes Lorretta Harp, MD  levothyroxine (SYNTHROID, LEVOTHROID) 25 MCG tablet Take 1 tablet (25 mcg total) by mouth daily before breakfast. Take with water 45-92min before  eating; don't mix w/ vitamins or supplements. 05/24/17  Yes Eppie Gibson, MD  lidocaine-prilocaine (EMLA) cream Apply one application to port a cath 1-2 hours prior to access. 10/18/15  Yes Curt Bears, MD  LORazepam (ATIVAN) 1 MG tablet Take 1 tablet (1 mg total) by mouth at bedtime. 02/28/16  Yes Thea Silversmith, MD  metoprolol (LOPRESSOR) 50 MG tablet Take 1 tablet (50 mg total) by mouth 2 (two) times daily. PLEASE CONTACT OFFICE FOR ADDITIONAL REFILLS 06/19/16   Lorretta Harp, MD  mometasone (ELOCON) 0.1 % cream Apply 1 application topically daily as needed (for rash). Reported on 05/25/2016 06/24/15   [provider]  nitroGLYCERIN (NITROSTAT) 0.4 MG SL tablet Place 1 tablet (0.4 mg total) under the tongue every 5 (five) minutes x 3 doses as needed for chest pain. 12/24/12   Erlene Quan, PA-C  ondansetron (ZOFRAN ODT) 4 MG disintegrating tablet Take 1 tablet (4 mg total) by mouth every 8 (eight) hours  as needed for nausea or vomiting. Patient not taking: Reported on 05/20/2017 04/24/17   Duffy Bruce, MD  OXYCONTIN 20 MG T12A 12 hr tablet Take 20 mg by mouth every 8 (eight) hours. 12/28/14   [provider]  pentoxifylline (TRENTAL) 400 MG CR tablet Take 400mg  PO daily  x 1 week, then 400mg  PO BID Patient not taking: Reported on 04/24/2017 04/12/17   Eppie Gibson, MD  prochlorperazine (COMPAZINE) 10 MG tablet Take 1 tablet (10 mg total) by mouth every 6 (six) hours as needed for nausea or vomiting. Patient not taking: Reported on 12/07/2016 08/16/15   Curt Bears, MD  rosuvastatin (CRESTOR) 20 MG tablet Take 20 mg by mouth daily.    [provider]  triamcinolone cream (KENALOG) 0.5 % Reported on 05/25/2016 05/08/16   [provider]  vitamin E (VITAMIN E) 400 UNIT capsule Take 1 cap PO daily  x 1 week, then 1 cap PO BID 04/12/17   Eppie Gibson, MD    Family History Family History  Problem Relation Age of Onset  . Heart disease Mother   . Hypertension  Sister   . Diabetes Sister   . Cancer Brother     Social History Social History  Substance Use Topics  . Smoking status: Current Some Day Smoker    Packs/day: 0.25    Years: 50.00    Types: Cigarettes  . Smokeless tobacco: Never Used     Comment: 1 pack will last pt 2 days//ee 3.2.17  . Alcohol use No     Allergies   Cortisone; Dronabinol; Latex; Lisinopril; Trental [pentoxifylline]; Antihistamines, chlorpheniramine-type; and Flexeril [cyclobenzaprine]   Review of Systems Review of Systems  Constitutional: Positive for fatigue. Negative for chills, diaphoresis and fever.  HENT: Negative for congestion.   Eyes: Negative for visual disturbance.  Respiratory: Negative for cough, chest tightness, shortness of breath, wheezing and stridor.   Cardiovascular: Negative for chest pain and palpitations.  Gastrointestinal: Positive for nausea and vomiting. Negative for abdominal pain, constipation and diarrhea.  Genitourinary: Negative for flank pain, frequency and urgency.  Musculoskeletal: Negative for back pain, neck pain and neck stiffness.  Skin: Negative for rash and wound.  Neurological: Positive for headaches. Negative for dizziness, syncope and light-headedness.  Psychiatric/Behavioral: Negative for agitation and confusion.     Physical Exam Updated Vital Signs BP (!) 95/59 (BP Location: Left Arm)   Pulse 81   Temp 98.5 F (36.9 C) (Oral)   Resp 14   SpO2 93%   Physical Exam  Constitutional: He is oriented to person, place, and time. He appears well-developed and well-nourished. No distress.  HENT:  Head: Normocephalic.  Mouth/Throat: Oropharynx is clear and moist. No oropharyngeal exudate.  Eyes: Pupils are equal, round, and reactive to light. Conjunctivae and EOM are normal.  Neck: Normal range of motion.  Cardiovascular: Normal rate.   No murmur heard. Pulmonary/Chest: Effort normal and breath sounds normal. No stridor. No respiratory distress. He has no  wheezes. He exhibits no tenderness.  Abdominal: Soft. He exhibits no distension. There is no tenderness.  Musculoskeletal: He exhibits no edema, tenderness or deformity.  Neurological: He is alert and oriented to person, place, and time. He is not disoriented. He displays no tremor. No cranial nerve deficit or sensory deficit. He exhibits normal muscle tone. Coordination normal. GCS eye subscore is 4. GCS verbal subscore is 5. GCS motor subscore is 6.  Pt somnolent but rousable  Skin: Skin is warm. Capillary refill takes less than 2  seconds. He is not diaphoretic. No erythema.  Psychiatric: He has a normal mood and affect.  Nursing note and vitals reviewed.    ED Treatments / Results  Labs (all labs ordered are listed, but only abnormal results are displayed) Labs Reviewed  BASIC METABOLIC PANEL - Abnormal; Notable for the following:       Result Value   Glucose, Bld 108 (*)    All other components within normal limits  CBC WITH DIFFERENTIAL/PLATELET - Abnormal; Notable for the following:    WBC 10.6 (*)    MCH 34.8 (*)    Platelets 122 (*)    Neutro Abs 9.1 (*)    All other components within normal limits  AMMONIA - Abnormal; Notable for the following:    Ammonia 77 (*)    All other components within normal limits  BLOOD GAS, VENOUS - Abnormal; Notable for the following:    pCO2, Ven 40.2 (*)    pO2, Ven 50.1 (*)    All other components within normal limits  CBG MONITORING, ED - Abnormal; Notable for the following:    Glucose-Capillary 114 (*)    All other components within normal limits  I-STAT CHEM 8, ED - Abnormal; Notable for the following:    Glucose, Bld 105 (*)    Calcium, Ion 1.12 (*)    All other components within normal limits  URINALYSIS, ROUTINE W REFLEX MICROSCOPIC  HEPATIC FUNCTION PANEL  I-STAT CG4 LACTIC ACID, ED  I-STAT CG4 LACTIC ACID, ED    EKG  EKG Interpretation  Date/Time:  Monday June 03 2017 21:20:23 EDT Ventricular Rate:  86 PR  Interval:  208 QRS Duration: 92 QT Interval:  368 QTC Calculation: 440 R Axis:   7 Text Interpretation:  Normal sinus rhythm Normal ECG When compared to prior, no significant changes. No STEMI Confirmed by Antony Blackbird [phone]) on 06/03/2017 11:34:02 PM       Radiology Dg Chest 2 View  Result Date: 06/03/2017 CLINICAL DATA:  Nausea and vomiting.  Somnolence.  [employer]. EXAM: CHEST  2 VIEW COMPARISON:  02/06/2017 FINDINGS: Right jugular port extends into the low SVC. The lungs are clear no pleural effusions. Normal heart size. Unremarkable hilar and mediastinal contours. Normal pulmonary vasculature. IMPRESSION: No active cardiopulmonary disease. Electronically Signed   By: Andreas Newport M.D.   On: 06/03/2017 23:08   Ct Head Wo Contrast  Result Date: 06/03/2017 CLINICAL DATA:  Fall with neck pain nausea and vomiting EXAM: CT HEAD WITHOUT CONTRAST CT CERVICAL SPINE WITHOUT CONTRAST TECHNIQUE: Multidetector CT imaging of the head and cervical spine was performed following the standard protocol without intravenous contrast. Multiplanar CT image reconstructions of the cervical spine were also generated. COMPARISON:  MRI 05/17/2017, CT brain 04/24/2017 FINDINGS: CT HEAD FINDINGS Brain: No acute intracranial hemorrhage is seen. Multifocal hypodensities in the left parietal lobe, left caudate, left posterior temporal lobe, and left cerebellum consistent with history of metastatic disease. Edema within the left cerebellar hemisphere with mild effacement of fourth ventricle as before. Mild mass effect on the left midbrain. The ventricles are stable in size. Confluent hypodensity within the bilateral white matter as before. Atrophy. Vascular: No hyperdense vessels. Scattered calcifications at the carotid siphons. Skull: No fracture.  No suspicious bone lesion. Sinuses/Orbits: Mild mucosal thickening in the ethmoid sinuses. No acute orbital abnormality. Other: None CT CERVICAL SPINE FINDINGS Alignment:  No subluxation is seen. Facet alignment is within normal limits. Skull base and vertebrae: No acute fracture. No primary bone lesion  or focal pathologic process. Soft tissues and spinal canal: No prevertebral fluid or swelling. No visible canal hematoma. Disc levels: Status post anterior plate and screw fixation at C5 and C6 with solid bony fusion present. Moderate narrowing and degenerative changes at C6-C7. Multi level bilateral facet hypertrophic arthropathy. Upper chest: Lung apices are clear. 19 mm nodule left lobe of the thyroid gland. Other: None IMPRESSION: 1. Negative for acute intracranial hemorrhage 2. Multifocal hypodensities corresponding to the history of metastatic brain lesions. Confluent white matter hypodensity which may relate to small vessel ischemic change or post radiation change. 3. Status post anterior plate and screw fixation at C5 and C6. No definite acute osseous abnormality 4. 19 mm nodule left lobe of thyroid, may be correlated with nonemergent thyroid ultrasound as clinically appropriate Electronically Signed   By: Donavan Foil M.D.   On: 06/03/2017 23:48   Ct Cervical Spine Wo Contrast  Result Date: 06/03/2017 CLINICAL DATA:  Fall with neck pain nausea and vomiting EXAM: CT HEAD WITHOUT CONTRAST CT CERVICAL SPINE WITHOUT CONTRAST TECHNIQUE: Multidetector CT imaging of the head and cervical spine was performed following the standard protocol without intravenous contrast. Multiplanar CT image reconstructions of the cervical spine were also generated. COMPARISON:  MRI 05/17/2017, CT brain 04/24/2017 FINDINGS: CT HEAD FINDINGS Brain: No acute intracranial hemorrhage is seen. Multifocal hypodensities in the left parietal lobe, left caudate, left posterior temporal lobe, and left cerebellum consistent with history of metastatic disease. Edema within the left cerebellar hemisphere with mild effacement of fourth ventricle as before. Mild mass effect on the left midbrain. The ventricles  are stable in size. Confluent hypodensity within the bilateral white matter as before. Atrophy. Vascular: No hyperdense vessels. Scattered calcifications at the carotid siphons. Skull: No fracture.  No suspicious bone lesion. Sinuses/Orbits: Mild mucosal thickening in the ethmoid sinuses. No acute orbital abnormality. Other: None CT CERVICAL SPINE FINDINGS Alignment: No subluxation is seen. Facet alignment is within normal limits. Skull base and vertebrae: No acute fracture. No primary bone lesion or focal pathologic process. Soft tissues and spinal canal: No prevertebral fluid or swelling. No visible canal hematoma. Disc levels: Status post anterior plate and screw fixation at C5 and C6 with solid bony fusion present. Moderate narrowing and degenerative changes at C6-C7. Multi level bilateral facet hypertrophic arthropathy. Upper chest: Lung apices are clear. 19 mm nodule left lobe of the thyroid gland. Other: None IMPRESSION: 1. Negative for acute intracranial hemorrhage 2. Multifocal hypodensities corresponding to the history of metastatic brain lesions. Confluent white matter hypodensity which may relate to small vessel ischemic change or post radiation change. 3. Status post anterior plate and screw fixation at C5 and C6. No definite acute osseous abnormality 4. 19 mm nodule left lobe of thyroid, may be correlated with nonemergent thyroid ultrasound as clinically appropriate Electronically Signed   By: Donavan Foil M.D.   On: 06/03/2017 23:48    Procedures Procedures (including critical care time)  Medications Ordered in ED Medications  sodium chloride 0.9 % bolus 1,000 mL (1,000 mLs Intravenous New Bag/Given 06/03/17 2329)      Initial Impression / Assessment and Plan / ED Course  I have reviewed the triage vital signs and the nursing notes.  Pertinent labs & imaging results that were available during my care of the patient were reviewed by me and considered in my medical decision making (see  chart for details).     Andrew Shaw is a 71 y.o. male with a past medical history significant  for CAD with PCI, hypertension, hyperlipidemia, DVT, lung cancer, and brain cancer status post radiation 2 weeks ago who presents with nausea, vomiting, fall with associated neck pain, and somnolence. Patient is calmly by family reports that for the last 3 days, patient has been "sleeping nonstop". They report that he has slept 22 hours out of 24 in the last 3 days. They say that this is a significant change for him. Today, he has had significant nausea and vomiting. While patient was going to vomit in the bathroom, patient had a mechanical fall slipping and landing on his neck. He reports severe, 10 out of 10 pain in his neck. He reports mild occipital headache as well. He denies any chest pain, palpitations, shortness of breath, congestion, or rhinorrhea. He reports a very mild dry cough. He denies any abdominal pain, conservation, diarrhea, or dysuria. He denies any sick contacts. He denies loss of consciousness from the fall.  Family reports that he has been extremely fatigued over the last few days as well. He has had no recent medication changes according to the patient's family.  On exam, patient is somnolent but arousable. Patient follows all commands appropriately. Patient is alert and oriented 3. Patient has normal sensation in extremities. Normal finger-nose-finger. Extraocular movement is intact and pupils are reactive and symmetric bilaterally. No facial droop. No slurring of speech. No significant lacerations or evidence of traumatic injuries on the head. Patient's neck is tender across. Lungs otherwise clear and chest and abdomen nontender.  Due to patient's brain tumors, recent radiation, and somnolence with nausea and vomiting, there is clinical concern for intracranial abnormality such as edema intervening symptoms. Patient may also be dehydrated causing sleepiness with the nausea and  vomiting. Patient will be given fluids and have laboratory testing to look for occult infections.  Anticipate reassessment following workup.       Diagnostic testing results are seen above. Urinalysis shows no UTI. Lactic acid nonelevated. Blood gas shows normal pH. Ammonia slightly elevated at 77, hepatic function test subsequently ordered. CBC showed mild leukocytosis but no evidence of anemia. Metabolic panel reassuring.  CT of the head showed no intracranial bleed. Similar edema due to metastasis and recent radiation present and similar to prior. No evidence of traumatic injury to the neck, cervical collar removed. Chest x-ray reassuring.  Given patient's continued somnolence, elevation in ammonia, and patient continued to have edema on head CT, decision made to admit the patient for observation and further workup. Patient may need steroids. Will defer to admitting team.   Hospitalist team called for further management and admission.     Final Clinical Impressions(s) / ED Diagnoses   Final diagnoses:  Increased ammonia level  Non-intractable vomiting with nausea, unspecified vomiting type  Fall, initial encounter    New Prescriptions New Prescriptions   No medications on file    Clinical Impression: 1. Increased ammonia level   2. Non-intractable vomiting with nausea, unspecified vomiting type   3. Fall, initial encounter     Disposition: Admit to Hospitalist    Aishia Barkey, Gwenyth Allegra, MD 06/04/17 1155

## 2017-06-03 NOTE — ED Triage Notes (Signed)
Patient is from home and transported [facility]. Patient reported to EMS that at 19:00, he developed nausea. At 20:00, he went to bend over the sink to vomit, fell, and vomited on the floor. Unknown if he has hit his head. Complaining of neck tenderness, has abrasion of the left forearm, generalized weakness x 1 week, and a productive cough tonight. Patient is a lung and brain CA with receiving chemo.

## 2017-06-03 NOTE — ED Notes (Signed)
Patient transported to CT 

## 2017-06-03 NOTE — ED Notes (Signed)
Bed: WA04 Expected date:  Expected time:  Means of arrival:  Comments: EMS 71 yo male from home-fell in bathroom-unknown LOC-nausea x 1 hour/weak and tired x 1 week

## 2017-06-04 ENCOUNTER — Ambulatory Visit
Admission: RE | Admit: 2017-06-04 | Discharge: 2017-06-04 | Disposition: A | Discharge status: Home / Self Care | Payer: Medicare Other | Source: Ambulatory Visit | Attending: Radiation Oncology | Admitting: Radiation Oncology

## 2017-06-04 ENCOUNTER — Encounter (HOSPITAL_COMMUNITY): Payer: Self-pay

## 2017-06-04 DIAGNOSIS — C3490 Malignant neoplasm of unspecified part of unspecified bronchus or lung: Secondary | ICD-10-CM

## 2017-06-04 DIAGNOSIS — Z91040 Latex allergy status: Secondary | ICD-10-CM | POA: Diagnosis not present

## 2017-06-04 DIAGNOSIS — Z96652 Presence of left artificial knee joint: Secondary | ICD-10-CM | POA: Diagnosis present

## 2017-06-04 DIAGNOSIS — G936 Cerebral edema: Secondary | ICD-10-CM | POA: Diagnosis present

## 2017-06-04 DIAGNOSIS — Z7952 Long term (current) use of systemic steroids: Secondary | ICD-10-CM | POA: Diagnosis not present

## 2017-06-04 DIAGNOSIS — R410 Disorientation, unspecified: Secondary | ICD-10-CM | POA: Diagnosis present

## 2017-06-04 DIAGNOSIS — Z888 Allergy status to other drugs, medicaments and biological substances status: Secondary | ICD-10-CM | POA: Insufficient documentation

## 2017-06-04 DIAGNOSIS — C7931 Secondary malignant neoplasm of brain: Secondary | ICD-10-CM | POA: Diagnosis not present

## 2017-06-04 DIAGNOSIS — F17210 Nicotine dependence, cigarettes, uncomplicated: Secondary | ICD-10-CM | POA: Diagnosis present

## 2017-06-04 DIAGNOSIS — Z79891 Long term (current) use of opiate analgesic: Secondary | ICD-10-CM | POA: Diagnosis not present

## 2017-06-04 DIAGNOSIS — R55 Syncope and collapse: Secondary | ICD-10-CM | POA: Diagnosis not present

## 2017-06-04 DIAGNOSIS — Z7982 Long term (current) use of aspirin: Secondary | ICD-10-CM | POA: Insufficient documentation

## 2017-06-04 DIAGNOSIS — E785 Hyperlipidemia, unspecified: Secondary | ICD-10-CM | POA: Diagnosis present

## 2017-06-04 DIAGNOSIS — Z923 Personal history of irradiation: Secondary | ICD-10-CM | POA: Insufficient documentation

## 2017-06-04 DIAGNOSIS — G9341 Metabolic encephalopathy: Secondary | ICD-10-CM | POA: Diagnosis not present

## 2017-06-04 DIAGNOSIS — E039 Hypothyroidism, unspecified: Secondary | ICD-10-CM | POA: Diagnosis present

## 2017-06-04 DIAGNOSIS — Z79899 Other long term (current) drug therapy: Secondary | ICD-10-CM | POA: Insufficient documentation

## 2017-06-04 DIAGNOSIS — E860 Dehydration: Secondary | ICD-10-CM | POA: Diagnosis not present

## 2017-06-04 DIAGNOSIS — Z91128 Patient's intentional underdosing of medication regimen for other reason: Secondary | ICD-10-CM | POA: Diagnosis not present

## 2017-06-04 DIAGNOSIS — R7989 Other specified abnormal findings of blood chemistry: Secondary | ICD-10-CM | POA: Diagnosis not present

## 2017-06-04 DIAGNOSIS — Z8249 Family history of ischemic heart disease and other diseases of the circulatory system: Secondary | ICD-10-CM | POA: Diagnosis not present

## 2017-06-04 DIAGNOSIS — R112 Nausea with vomiting, unspecified: Secondary | ICD-10-CM | POA: Diagnosis not present

## 2017-06-04 DIAGNOSIS — I252 Old myocardial infarction: Secondary | ICD-10-CM | POA: Diagnosis not present

## 2017-06-04 DIAGNOSIS — G893 Neoplasm related pain (acute) (chronic): Secondary | ICD-10-CM | POA: Diagnosis present

## 2017-06-04 DIAGNOSIS — Y92013 Bedroom of single-family (private) house as the place of occurrence of the external cause: Secondary | ICD-10-CM | POA: Diagnosis not present

## 2017-06-04 DIAGNOSIS — Z981 Arthrodesis status: Secondary | ICD-10-CM | POA: Diagnosis not present

## 2017-06-04 DIAGNOSIS — I10 Essential (primary) hypertension: Secondary | ICD-10-CM | POA: Diagnosis present

## 2017-06-04 DIAGNOSIS — W1830XA Fall on same level, unspecified, initial encounter: Secondary | ICD-10-CM | POA: Diagnosis present

## 2017-06-04 DIAGNOSIS — R5383 Other fatigue: Secondary | ICD-10-CM | POA: Diagnosis not present

## 2017-06-04 DIAGNOSIS — E7220 Disorder of urea cycle metabolism, unspecified: Secondary | ICD-10-CM | POA: Diagnosis not present

## 2017-06-04 DIAGNOSIS — T380X6A Underdosing of glucocorticoids and synthetic analogues, initial encounter: Secondary | ICD-10-CM | POA: Diagnosis present

## 2017-06-04 DIAGNOSIS — I2510 Atherosclerotic heart disease of native coronary artery without angina pectoris: Secondary | ICD-10-CM | POA: Diagnosis present

## 2017-06-04 LAB — URINALYSIS, ROUTINE W REFLEX MICROSCOPIC
Bilirubin Urine: NEGATIVE
Glucose, UA: NEGATIVE mg/dL
HGB URINE DIPSTICK: NEGATIVE
KETONES UR: NEGATIVE mg/dL
LEUKOCYTES UA: NEGATIVE
Nitrite: NEGATIVE
PROTEIN: NEGATIVE mg/dL
Specific Gravity, Urine: 1.005 (ref 1.005–1.030)
pH: 8 (ref 5.0–8.0)

## 2017-06-04 LAB — HEPATIC FUNCTION PANEL
ALK PHOS: 62 U/L (ref 38–126)
ALT: 12 U/L — AB (ref 17–63)
AST: 21 U/L (ref 15–41)
Albumin: 3.8 g/dL (ref 3.5–5.0)
Bilirubin, Direct: 0.2 mg/dL (ref 0.1–0.5)
Indirect Bilirubin: 0.2 mg/dL — ABNORMAL LOW (ref 0.3–0.9)
Total Bilirubin: 0.4 mg/dL (ref 0.3–1.2)
Total Protein: 6.6 g/dL (ref 6.5–8.1)

## 2017-06-04 LAB — AMMONIA: AMMONIA: 12 umol/L (ref 9–35)

## 2017-06-04 LAB — I-STAT CG4 LACTIC ACID, ED: LACTIC ACID, VENOUS: 0.88 mmol/L (ref 0.5–1.9)

## 2017-06-04 MED ORDER — ACETAMINOPHEN 650 MG RE SUPP
650.0000 mg | Freq: Four times a day (QID) | RECTAL | Status: DC | PRN
Start: 2017-06-04 — End: 2017-06-06

## 2017-06-04 MED ORDER — ONDANSETRON HCL 4 MG/2ML IJ SOLN: 4.0000 mg | Freq: Four times a day (QID) | INTRAMUSCULAR | Status: DC | PRN

## 2017-06-04 MED ORDER — OXYCODONE HCL ER 10 MG PO T12A
20.0000 mg | EXTENDED_RELEASE_TABLET | Freq: Two times a day (BID) | ORAL | Status: DC
  Administered 2017-06-04 – 2017-06-06 (×5): 20 mg via ORAL
  Filled 2017-06-04 (×5): qty 2

## 2017-06-04 MED ORDER — DEXAMETHASONE SODIUM PHOSPHATE 4 MG/ML IJ SOLN
4.0000 mg | Freq: Four times a day (QID) | INTRAMUSCULAR | Status: DC
  Administered 2017-06-04 – 2017-06-05 (×4): 4 mg via INTRAVENOUS
  Filled 2017-06-04 (×5): qty 1

## 2017-06-04 MED ORDER — ONDANSETRON HCL 4 MG PO TABS
4.0000 mg | ORAL_TABLET | Freq: Four times a day (QID) | ORAL | Status: DC | PRN
Start: 2017-06-04 — End: 2017-06-06

## 2017-06-04 MED ORDER — IRBESARTAN 150 MG PO TABS
150.0000 mg | ORAL_TABLET | Freq: Every day | ORAL | Status: DC
  Administered 2017-06-04 – 2017-06-06 (×3): 150 mg via ORAL
  Filled 2017-06-04 (×3): qty 1

## 2017-06-04 MED ORDER — HYDROCODONE-ACETAMINOPHEN 10-325 MG PO TABS
1.0000 | ORAL_TABLET | ORAL | Status: DC | PRN
  Administered 2017-06-04 – 2017-06-06 (×8): 1 via ORAL
  Filled 2017-06-04 (×8): qty 1

## 2017-06-04 MED ORDER — ASPIRIN EC 81 MG PO TBEC
81.0000 mg | DELAYED_RELEASE_TABLET | Freq: Every day | ORAL | Status: DC
  Administered 2017-06-04 – 2017-06-06 (×3): 81 mg via ORAL
  Filled 2017-06-04 (×3): qty 1

## 2017-06-04 MED ORDER — METOPROLOL TARTRATE 25 MG PO TABS
50.0000 mg | ORAL_TABLET | Freq: Two times a day (BID) | ORAL | Status: DC
  Administered 2017-06-04 – 2017-06-06 (×5): 50 mg via ORAL
  Filled 2017-06-04 (×5): qty 2

## 2017-06-04 MED ORDER — ROSUVASTATIN CALCIUM 20 MG PO TABS
20.0000 mg | ORAL_TABLET | Freq: Every day | ORAL | Status: DC
  Administered 2017-06-04 – 2017-06-06 (×3): 20 mg via ORAL
  Filled 2017-06-04 (×3): qty 1

## 2017-06-04 MED ORDER — LEVOTHYROXINE SODIUM 25 MCG PO TABS
25.0000 ug | ORAL_TABLET | Freq: Every day | ORAL | Status: DC
  Administered 2017-06-04 – 2017-06-06 (×3): 25 ug via ORAL
  Filled 2017-06-04 (×3): qty 1

## 2017-06-04 MED ORDER — VITAMIN E 180 MG (400 UNIT) PO CAPS
400.0000 [IU] | ORAL_CAPSULE | Freq: Every day | ORAL | Status: DC
  Administered 2017-06-04 – 2017-06-06 (×3): 400 [IU] via ORAL
  Filled 2017-06-04 (×3): qty 1

## 2017-06-04 MED ORDER — NYSTATIN 100000 UNIT/ML MT SUSP: 5.0000 mL | Freq: Four times a day (QID) | OROMUCOSAL | Status: DC | PRN

## 2017-06-04 MED ORDER — LIDOCAINE-PRILOCAINE 2.5-2.5 % EX CREA
TOPICAL_CREAM | Freq: Once | CUTANEOUS | Status: AC
Start: 2017-06-04 — End: 2017-06-04
  Administered 2017-06-04: 16:00:00 via TOPICAL
  Filled 2017-06-04: qty 5

## 2017-06-04 MED ORDER — SODIUM CHLORIDE 0.9 % IV SOLN
INTRAVENOUS | Status: DC
  Administered 2017-06-04 (×2): via INTRAVENOUS
  Administered 2017-06-05: 1000 mL via INTRAVENOUS

## 2017-06-04 MED ORDER — DIPHENOXYLATE-ATROPINE 2.5-0.025 MG PO TABS: 2.0000 | ORAL_TABLET | Freq: Four times a day (QID) | ORAL | Status: DC | PRN

## 2017-06-04 MED ORDER — FENOFIBRATE 54 MG PO TABS
54.0000 mg | ORAL_TABLET | Freq: Every day | ORAL | Status: DC
  Administered 2017-06-04 – 2017-06-06 (×3): 54 mg via ORAL
  Filled 2017-06-04 (×3): qty 1

## 2017-06-04 MED ORDER — ACETAMINOPHEN 325 MG PO TABS: 650.0000 mg | ORAL_TABLET | Freq: Four times a day (QID) | ORAL | Status: DC | PRN

## 2017-06-04 NOTE — Consult Note (Signed)
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 PTV2 Left cerebellar 11 mm was treated to 20 Gy in 1 fraction. PTV3 Left temporal 6 mm was treated to 20 Gy in 1 fraction. PTV4 Left Caudate 6 mm was treated to 20 Gy in 1 fraction.  03/06/2016-03/21/2016: Whole brain and right hilum to 30 Gy in 10 fractions at 3 Gy per fraction.    CHIEF COMPLAINT: Here for follow-up and surveillance of progressive metastatic brain cancer  Interval Since Last Radiation:  1 month  Narrative:  The patient returns today for in-patient follow-up of radiation most recently completed on 05/24/17.   The patient presented to the ED on 06/03/2017 for sudden onset nausea, vomiting, on yesterday. Pt was ambulating to the restroom when he had a mechanical fall with subsequent neck pain. Pt was admitted to the hospital for observation due to pt somnolence, elevated ammonia levels, and edema noted on CT head.   CT head and cervical spine without contrast was negative for acute intracranial hemorrhage. Additionally noted were multifocal hypodensities corresponding to the history of metastatic brain lesions. Confluent white matter hypodensity which may relate to small vessel ischemic change or post radiation change. Status post anterior plate and screw fixation at C5 and C6. No definite acute osseous abnormality. 19 mm nodule left lobe of thyroid, may be correlated with nonemergent thyroid ultrasound as clinically appropriate.  The patient and family requested to see me today to discuss his recent symptoms. Getting IV steroids since admission, feeling better.  On review of  systems, the patient reports he is feeling better. He would like to be discharged from the hospital and return home if possible. He denies headaches, nausea, or vomiting. He denies any other episodes of syncope or weakness other than that which brought him to the ED. His wife is reached by phone during the encounter, and reports the patient has stayed awake more often today than yesterday, when she could not keep him awake at all. She rpeorts he had been sleeping up to 22 hours daily.  The patient has been taking synthroid as prescribed.   ALLERGIES:  is allergic to cortisone; dronabinol; latex; lisinopril; trental [pentoxifylline]; antihistamines, chlorpheniramine-type; and flexeril [cyclobenzaprine].  Meds: No current facility-administered medications for this encounter.    No current outpatient prescriptions on file.   Facility-Administered Medications Ordered in Other Encounters  Medication Dose Route Frequency Provider Last Rate Last Dose  . 0.9 %  sodium chloride infusion   Intravenous Continuous Etta Quill, DO 75 mL/hr at 06/04/17 9470    . acetaminophen (TYLENOL) tablet 650 mg  650 mg Oral Q6H PRN Etta Quill, DO       Or  . acetaminophen (TYLENOL) suppository 650 mg  650 mg Rectal Q6H PRN Etta Quill, DO      . aspirin EC tablet 81 mg  81 mg Oral Daily Jennette Kettle M, DO   81 mg at 06/04/17 1012  . dexamethasone (DECADRON) injection 4 mg  4 mg Intravenous Q6H Jennette Kettle M, DO   4 mg at 06/04/17 0328  . diphenoxylate-atropine (LOMOTIL) 2.5-0.025 MG per  tablet 2 tablet  2 tablet Oral QID PRN Etta Quill, DO      . fenofibrate tablet 54 mg  54 mg Oral Daily Jennette Kettle M, DO   54 mg at 06/04/17 1012  . HYDROcodone-acetaminophen (NORCO) 10-325 MG per tablet 1 tablet  1 tablet Oral Q4H PRN Etta Quill, DO      . irbesartan (AVAPRO) tablet 150 mg  150 mg Oral Daily Jennette Kettle M, DO   150 mg at 06/04/17 1012  . levothyroxine (SYNTHROID, LEVOTHROID)  tablet 25 mcg  25 mcg Oral QAC breakfast Etta Quill, DO   25 mcg at 06/04/17 9702  . lidocaine-prilocaine (EMLA) cream   Topical Once Aline August, MD      . metoprolol tartrate (LOPRESSOR) tablet 50 mg  50 mg Oral BID Jennette Kettle M, DO   50 mg at 06/04/17 1011  . nystatin (MYCOSTATIN) 100000 UNIT/ML suspension 500,000 Units  5 mL Oral QID PRN Etta Quill, DO      . ondansetron [facility]) tablet 4 mg  4 mg Oral Q6H PRN Etta Quill, DO       Or  . ondansetron [facility]) injection 4 mg  4 mg Intravenous Q6H PRN Etta Quill, DO      . oxyCODONE (OXYCONTIN) 12 hr tablet 20 mg  20 mg Oral Q12H Jennette Kettle M, DO   20 mg at 06/04/17 1012  . rosuvastatin (CRESTOR) tablet 20 mg  20 mg Oral Daily Jennette Kettle M, DO   20 mg at 06/04/17 1012  . vitamin E capsule 400 Units  400 Units Oral Daily Etta Quill, DO   400 Units at 06/04/17 1011   REVIEW OF SYSTEMS: as above  Physical Findings: The patient is in no acute distress. Patient is alert and oriented.  Vitals with BMI 06/04/2017  Height   Weight   BMI   Systolic 637  Diastolic 52  Pulse 65  Respirations 18  No significant changes. General: Alert and oriented, in no acute distress. HEENT: Oropharynx is clear. No oral thrush. Neurologic: No obvious focalities. Speech is fluent. Coordination is intact. Psychiatric: Judgment and insight are intact. Affect is appropriate.  Lab Findings: Lab Results  Component Value Date   WBC 10.6 (H) 06/03/2017   HGB 15.3 06/03/2017   HCT 45.0 06/03/2017   MCV 97.7 06/03/2017   PLT 122 (L) 06/03/2017    Radiographic Findings: Dg Chest 2 View  Result Date: 06/03/2017 CLINICAL DATA:  Nausea and vomiting.  Somnolence.  [employer]. EXAM: CHEST  2 VIEW COMPARISON:  02/06/2017 FINDINGS: Right jugular port extends into the low SVC. The lungs are clear no pleural effusions. Normal heart size. Unremarkable hilar and mediastinal contours. Normal pulmonary vasculature. IMPRESSION: No  active cardiopulmonary disease. Electronically Signed   By: Andreas Newport M.D.   On: 06/03/2017 23:08   Ct Head Wo Contrast  Result Date: 06/03/2017 CLINICAL DATA:  Fall with neck pain nausea and vomiting EXAM: CT HEAD WITHOUT CONTRAST CT CERVICAL SPINE WITHOUT CONTRAST TECHNIQUE: Multidetector CT imaging of the head and cervical spine was performed following the standard protocol without intravenous contrast. Multiplanar CT image reconstructions of the cervical spine were also generated. COMPARISON:  MRI 05/17/2017, CT brain 04/24/2017 FINDINGS: CT HEAD FINDINGS Brain: No acute intracranial hemorrhage is seen. Multifocal hypodensities in the left parietal lobe, left caudate, left posterior temporal lobe, and left cerebellum consistent with history of metastatic disease. Edema within the left cerebellar hemisphere with mild  effacement of fourth ventricle as before. Mild mass effect on the left midbrain. The ventricles are stable in size. Confluent hypodensity within the bilateral white matter as before. Atrophy. Vascular: No hyperdense vessels. Scattered calcifications at the carotid siphons. Skull: No fracture.  No suspicious bone lesion. Sinuses/Orbits: Mild mucosal thickening in the ethmoid sinuses. No acute orbital abnormality. Other: None CT CERVICAL SPINE FINDINGS Alignment: No subluxation is seen. Facet alignment is within normal limits. Skull base and vertebrae: No acute fracture. No primary bone lesion or focal pathologic process. Soft tissues and spinal canal: No prevertebral fluid or swelling. No visible canal hematoma. Disc levels: Status post anterior plate and screw fixation at C5 and C6 with solid bony fusion present. Moderate narrowing and degenerative changes at C6-C7. Multi level bilateral facet hypertrophic arthropathy. Upper chest: Lung apices are clear. 19 mm nodule left lobe of the thyroid gland. Other: None IMPRESSION: 1. Negative for acute intracranial hemorrhage 2. Multifocal  hypodensities corresponding to the history of metastatic brain lesions. Confluent white matter hypodensity which may relate to small vessel ischemic change or post radiation change. 3. Status post anterior plate and screw fixation at C5 and C6. No definite acute osseous abnormality 4. 19 mm nodule left lobe of thyroid, may be correlated with nonemergent thyroid ultrasound as clinically appropriate Electronically Signed   By: Donavan Foil M.D.   On: 06/03/2017 23:48   Ct Cervical Spine Wo Contrast  Result Date: 06/03/2017 CLINICAL DATA:  Fall with neck pain nausea and vomiting EXAM: CT HEAD WITHOUT CONTRAST CT CERVICAL SPINE WITHOUT CONTRAST TECHNIQUE: Multidetector CT imaging of the head and cervical spine was performed following the standard protocol without intravenous contrast. Multiplanar CT image reconstructions of the cervical spine were also generated. COMPARISON:  MRI 05/17/2017, CT brain 04/24/2017 FINDINGS: CT HEAD FINDINGS Brain: No acute intracranial hemorrhage is seen. Multifocal hypodensities in the left parietal lobe, left caudate, left posterior temporal lobe, and left cerebellum consistent with history of metastatic disease. Edema within the left cerebellar hemisphere with mild effacement of fourth ventricle as before. Mild mass effect on the left midbrain. The ventricles are stable in size. Confluent hypodensity within the bilateral white matter as before. Atrophy. Vascular: No hyperdense vessels. Scattered calcifications at the carotid siphons. Skull: No fracture.  No suspicious bone lesion. Sinuses/Orbits: Mild mucosal thickening in the ethmoid sinuses. No acute orbital abnormality. Other: None CT CERVICAL SPINE FINDINGS Alignment: No subluxation is seen. Facet alignment is within normal limits. Skull base and vertebrae: No acute fracture. No primary bone lesion or focal pathologic process. Soft tissues and spinal canal: No prevertebral fluid or swelling. No visible canal hematoma. Disc  levels: Status post anterior plate and screw fixation at C5 and C6 with solid bony fusion present. Moderate narrowing and degenerative changes at C6-C7. Multi level bilateral facet hypertrophic arthropathy. Upper chest: Lung apices are clear. 19 mm nodule left lobe of the thyroid gland. Other: None IMPRESSION: 1. Negative for acute intracranial hemorrhage 2. Multifocal hypodensities corresponding to the history of metastatic brain lesions. Confluent white matter hypodensity which may relate to small vessel ischemic change or post radiation change. 3. Status post anterior plate and screw fixation at C5 and C6. No definite acute osseous abnormality 4. 19 mm nodule left lobe of thyroid, may be correlated with nonemergent thyroid ultrasound as clinically appropriate Electronically Signed   By: Donavan Foil M.D.   On: 06/03/2017 23:48   Mr [name] RX Contrast  Result Date: 05/17/2017 CLINICAL DATA:  Small cell lung  cancer with brain metastasis, follow-up evaluation. EXAM: MRI HEAD WITHOUT AND WITH CONTRAST TECHNIQUE: Multiplanar, multiecho pulse sequences of the brain and surrounding structures were obtained without and with intravenous contrast. CONTRAST:  54mL MULTIHANCE GADOBENATE DIMEGLUMINE 529 MG/ML IV SOLN COMPARISON:  MRI of the head Apr 05, 2017 and MRI of the head February 04, 2017 FINDINGS: INTRACRANIAL CONTENTS: 2 infratentorial and 3 supratentorial enhancing metastasis present, varying from stable to larger as follows: 2.6 x 2.5 cm LEFT cerebellar mass was 2.3 x 2.4 cm. 5 x 3 mm RIGHT cerebellar mass is now 5 x 8 mm. Stable LEFT caudate 7 x 11 mm metastasis. Stable LEFT parietal 9 x 12 mm metastasis. 10 x 9 mm LEFT temporal lobe metastasis was 7 x 5 mm. Faint susceptibility artifact within the metastasis is new, the difference of which may be technical. Slight associated reduced diffusion may be spurious or, seen with hypercellular tumor. T1 shortening cerebellar dentate nuclei associated with treatment  related change. Overall similar associated vasogenic edema. Confluent supratentorial white matter FLAIR T2 hyperintensities. Old small RIGHT cerebellar versus treated metastasis. Ventricles and sulci are overall normal for patient's age. No midline shift. No abnormal extra-axial fluid collections or abnormal extra-axial enhancement. VASCULAR: Normal major intracranial vascular flow voids present at skull base. SKULL AND UPPER CERVICAL SPINE: No abnormal sellar expansion. No suspicious calvarial bone marrow signal. Generalized bright T1 bone marrow signal compatible with post radiation change. Craniocervical junction maintained. SINUSES/ORBITS: The mastoid air-cells and included paranasal sinuses are well-aerated.The included ocular globes and orbital contents are non-suspicious. OTHER: None. IMPRESSION: 1. Five intracranial metastasis, 3 of which demonstrate interval growth. No new metastasis. 2. Confluent supratentorial white matter FLAIR T2 hyperintensities most compatible with whole-brain radiation. Electronically Signed   By: Elon Alas M.D.   On: 05/17/2017 17:47    Impression/Plan:  I would question whether there is any reason that part of his fatigue and somnolence is related to some type of hormonal imbalance. The patient is already on synthroid and his TSH before starting Synthroid was only mildly abnormal.  I do not believe his thyroid function was severely depressed in such a way that would cause his symptoms. It's possible there is also adrenal insufficiency.  Given the vomiting and syncopal episode, as well as history of steroid use and somnolence, I do wonder if there is a degree of adrenal insufficieny. 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 Usually the dose he received would not cause hypopituitarism, however there are probably multiple ways that he could have adrenal insufficiency beyond the history of radiation to his brian and history of  steroid use, and I would suggest his inpatient team order lab work to work this up. I'm not sure how his current dexamethasone administration might complicate that type of lab work-up.  I discussed with the patient's wife via phone that once the patient is discharged, the symptom management clinic would likely be a good resource for the patient should he encounter any concerns. Additionally, we discussed that the patient should maintain excellent hydration and push oral fluids readily.  The patient will keep his already scheduled follow up appointment with me on 07/02/17. I will look forward to continued participation in this patient's care as needed.  I spent 20 minutes face to face with the patient and more than 50% of that time was spent in counseling and/or coordination of care. _____________________________________   Eppie Gibson, MD  This document serves as a record  of services personally performed by Eppie Gibson, MD. It was created on her behalf by Maryla Morrow, a trained medical scribe. The creation of this record is based on the scribe's personal observations and the provider's statements to them. This document has been checked and approved by the attending provider.

## 2017-06-04 NOTE — Evaluation (Signed)
SLP Cancellation Note  Patient Details Name: Andrew Shaw MRN: [id number] DOB: Oct 16, 1946   Cancelled treatment:       Reason Eval/Treat Not Completed:  (pt at radiation, will continue efforts)  Luanna Salk, [facility] SLP [place], [place] 06/04/2017, 3:21 PM

## 2017-06-04 NOTE — H&P (Signed)
History and Physical    Andrew Shaw QIH:[id number] DOB: August 06, 1946 DOA: 06/03/2017  PCP: Alroy Dust, L.Marlou Sa, MD  Patient coming from: Home  I have personally briefly reviewed patient's old medical records in [place]  Chief Complaint: AMS  HPI: Andrew Shaw is a 71 y.o. male with medical history significant of SCLC with brain mets.  Was put on decadron last month for vasogenic edema causing neurologic symptoms.  Improved neurologically while on this med, until he stopped taking it.  Per family he doesn't like the word "steroid" and may have stopped after finding out that this was a steroid.  Patient had radiation therapy to brain mets 2 weeks ago.  For the past 3 days patient has been "sleeping non-stop".  Today had significant nausea and vomiting.   ED Course: CT head shows vasogenic edema similar to prior.  Ammonia level of 77, remainder of liver labs are WNL though.   Review of Systems: As per HPI otherwise 10 point review of systems negative.   Past Medical History:  Diagnosis Date  . Arthritis   . Back pain 12/23/2012  . BPH (benign prostatic hyperplasia)   . Brain metastases ([place]) dx'd 02/2016  . Carpal tunnel syndrome   . Carpal tunnel syndrome, right   . Chronic back pain   . COPD (chronic obstructive pulmonary disease) ([place])   . Coronary artery disease    2D ECHO, 10/31/2010 - EF >55%, normal; NUCLEAR STRESS TEST, 10/23/2010 - perfusion defect in inferior myocardial region, post-stress EF 62%, EKG negative for ischemia  . DVT (deep venous thrombosis) ([place])    history 2004 after knee surg  . GERD (gastroesophageal reflux disease)   . History of radiation therapy 10/31/2016   SRS radiation, Left anterior parietal, Left cerebellar, Left temporal, Left Caudate (each 20 Gy in 1 fraction)  . Hx of radiation therapy 03/06/16- 03/21/2016   Whole Brain and Right [place]  . Hyperlipemia   . Hypertension   . Lung cancer ([place]) dx'd 07/2015  . Myocardial infarction  ([place]) 2005   from steroids  . Neuromuscular disorder ([place])   . 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 S/P angioplasty with stent, BMS to LCX  12/23/12 12/23/2012  . Shortness of breath    with exertion  . Skin rash 08/22/2015    Past Surgical History:  Procedure Laterality Date  . BACK SURGERY  2004   lumb fusion  . CARDIAC CATHETERIZATION  12/23/2012   Mid nondominant AV groove circumflex stented with a 2.5x18mm Mini Vision stent resulting in a reduction of 90% stenosis to 0% residual  . CARDIAC CATHETERIZATION  02/07/2004   Noncritical CAD, continue medical therapy  . CARDIAC CATHETERIZATION  02/03/1999   Recommended medical therapy  . CARPAL TUNNEL RELEASE  04/15/2012   Procedure: CARPAL TUNNEL RELEASE;  Surgeon: Wynonia Sours, MD;  Location: [facility];  Service: Orthopedics;  Laterality: Left;  . CERVICAL FUSION  1999  . COLONOSCOPY W/ POLYPECTOMY    . EPIDURAL BLOCK INJECTION     multiple lumbar  . HERNIA REPAIR  2008   umb   . JOINT REPLACEMENT Left 2008   lt total knee  . KNEE ARTHROSCOPY  04,06   left  . LEFT HEART CATHETERIZATION WITH CORONARY ANGIOGRAM N/A 12/23/2012   Procedure: LEFT HEART CATHETERIZATION WITH CORONARY ANGIOGRAM;  Surgeon: Lorretta Harp, MD;  Location: [facility] CATH LAB;  Service: Cardiovascular;  Laterality: N/A;  . LEFT HEART CATHETERIZATION WITH  CORONARY ANGIOGRAM N/A 01/11/2015   Procedure: LEFT HEART CATHETERIZATION WITH CORONARY ANGIOGRAM;  Surgeon: Peter M Martinique, MD;  Location: [facility] CATH LAB;  Service: Cardiovascular;  Laterality: N/A;  . MANIPULATION KNEE JOINT Left 2009   closed lt knee   . PERCUTANEOUS CORONARY STENT INTERVENTION (PCI-S)  12/23/2012   Procedure: PERCUTANEOUS CORONARY STENT INTERVENTION (PCI-S);  Surgeon: Lorretta Harp, MD;  Location: [facility] CATH LAB;  Service: Cardiovascular;;  . TRIGGER FINGER RELEASE  04/15/2012   Procedure: RELEASE TRIGGER FINGER/A-1 PULLEY;  Surgeon: Wynonia Sours, MD;  Location: [place];  Service: Orthopedics;  Laterality: Left;  left thumb and little finger  . VIDEO BRONCHOSCOPY WITH ENDOBRONCHIAL ULTRASOUND N/A 07/28/2015   Procedure: VIDEO BRONCHOSCOPY WITH ENDOBRONCHIAL ULTRASOUND;  Surgeon: [name], MD;  Location: [place];  Service: Thoracic;  Laterality: N/A;     reports that he has been smoking Cigarettes.  He has a 12.50 pack-year smoking history. He has never used smokeless tobacco. He reports that he does not drink alcohol or use drugs.  Allergies  Allergen Reactions  . Cortisone Other (See Comments)    MI  . Dronabinol Nausea Only    " Nausea and felt ''funny in the head"  . Latex Rash  . Lisinopril Rash and Other (See Comments)    Swelling of face  . Trental [Pentoxifylline] Other (See Comments)    Extreme fatigue, rash, vomiting  . Antihistamines, Chlorpheniramine-Type Itching  . Flexeril [Cyclobenzaprine] Itching    Family History  Problem Relation Age of Onset  . Heart disease Mother   . Hypertension Sister   . Diabetes Sister   . Cancer Brother      Prior to Admission medications   Medication Sig Start Date End Date Taking? Authorizing Provider  aspirin EC 81 MG tablet Take 81 mg by mouth daily.   Yes [provider]  diphenoxylate-atropine (LOMOTIL) 2.5-0.025 MG tablet Take 2 tablets by mouth 4 (four) times daily as needed for diarrhea or loose stools. 05/17/16  Yes Susanne Borders, NP  fenofibrate (TRICOR) 48 MG tablet Take 48 mg by mouth daily.   Yes [provider]  HYDROcodone-acetaminophen (NORCO) 10-325 MG per tablet Take 1 tablet by mouth every 4 (four) hours as needed (breakthrough pain).    Yes [provider]  irbesartan (AVAPRO) 150 MG tablet TAKE 1 TABLET(150 MG) BY MOUTH DAILY 04/23/16  Yes Lorretta Harp, MD  levothyroxine (SYNTHROID, LEVOTHROID) 25 MCG tablet Take 1 tablet (25 mcg total) by mouth daily before breakfast. Take with water 45-68min before eating; don't mix w/ vitamins or  supplements. 05/24/17  Yes Eppie Gibson, MD  lidocaine-prilocaine (EMLA) cream Apply one application to port a cath 1-2 hours prior to access. 10/18/15  Yes Curt Bears, MD  LORazepam (ATIVAN) 1 MG tablet Take 1 tablet (1 mg total) by mouth at bedtime. 02/28/16  Yes Thea Silversmith, MD  metoprolol (LOPRESSOR) 50 MG tablet Take 1 tablet (50 mg total) by mouth 2 (two) times daily. PLEASE CONTACT OFFICE FOR ADDITIONAL REFILLS 06/19/16  Yes Lorretta Harp, MD  mometasone (ELOCON) 0.1 % cream Apply 1 application topically daily as needed (for rash). Reported on 05/25/2016 06/24/15  Yes [provider]  nitroGLYCERIN (NITROSTAT) 0.4 MG SL tablet Place 1 tablet (0.4 mg total) under the tongue every 5 (five) minutes x 3 doses as needed for chest pain. 12/24/12  Yes Kilroy, Luke K, PA-C  nystatin (MYCOSTATIN) 100000 UNIT/ML suspension Take 5 mLs by mouth  4 (four) times daily. 04/25/17  Yes [provider]  ondansetron (ZOFRAN ODT) 4 MG disintegrating tablet Take 1 tablet (4 mg total) by mouth every 8 (eight) hours as needed for nausea or vomiting. 04/24/17  Yes Duffy Bruce, MD  OXYCONTIN 20 MG T12A 12 hr tablet Take 20 mg by mouth every 8 (eight) hours. 12/28/14  Yes [provider]  rosuvastatin (CRESTOR) 20 MG tablet Take 20 mg by mouth daily.   Yes [provider]  triamcinolone cream (KENALOG) 0.5 % 1 application when needed for rash 05/08/16  Yes [provider]  vitamin E (VITAMIN E) 400 UNIT capsule Take 1 cap PO daily  x 1 week, then 1 cap PO BID Patient taking differently: Take 400 Units by mouth daily.  04/12/17  Yes Eppie Gibson, MD    Physical Exam: Vitals:   06/04/17 0045 06/04/17 0100 06/04/17 0115 06/04/17 0218  BP:  101/60  105/66  Pulse: 78 76 74 77  Resp: 14 18 16 17   Temp:      TempSrc:      SpO2: 93% (!) 88% 93% 94%    Constitutional: NAD, calm, comfortable Eyes: PERRL, lids and conjunctivae normal ENMT: Mucous membranes are moist.  Posterior pharynx clear of any exudate or lesions.Normal dentition.  Neck: normal, supple, no masses, no thyromegaly Respiratory: clear to auscultation bilaterally, no wheezing, no crackles. Normal respiratory effort. No accessory muscle use.  Cardiovascular: Regular rate and rhythm, no murmurs / rubs / gallops. No extremity edema. 2+ pedal pulses. No carotid bruits.  Abdomen: no tenderness, no masses palpated. No hepatosplenomegaly. Bowel sounds positive.  Musculoskeletal: no clubbing / cyanosis. No joint deformity upper and lower extremities. Good ROM, no contractures. Normal muscle tone.  Skin: no rashes, lesions, ulcers. No induration Neurologic: Somnolent but arrousable, non-focal. Psychiatric: Normal judgment and insight. Alert and oriented x 3. Normal mood.    Labs on Admission: I have personally reviewed following labs and imaging studies  CBC:  Recent Labs Lab 06/03/17 2130 06/03/17 2144  WBC 10.6*  --   NEUTROABS 9.1*  --   HGB 14.9 15.3  HCT 41.8 45.0  MCV 97.7  --   PLT 122*  --    Basic Metabolic Panel:  Recent Labs Lab 06/03/17 2130 06/03/17 2144  NA 137 137  K 3.9 3.9  CL 105 103  CO2 23  --   GLUCOSE 108* 105*  BUN 9 7  CREATININE 0.85 0.70  CALCIUM 9.2  --    GFR: CrCl cannot be calculated (Unknown ideal weight.). Liver Function Tests:  Recent Labs Lab 06/03/17 2136  AST 21  ALT 12*  ALKPHOS 62  BILITOT 0.4  PROT 6.6  ALBUMIN 3.8   No results for input(s): LIPASE, AMYLASE in the last 168 hours.  Recent Labs Lab 06/03/17 2321  AMMONIA 77*   Coagulation Profile: No results for input(s): INR, PROTIME in the last 168 hours. Cardiac Enzymes: No results for input(s): CKTOTAL, CKMB, CKMBINDEX, TROPONINI in the last 168 hours. BNP (last 3 results) No results for input(s): PROBNP in the last 8760 hours. HbA1C: No results for input(s): HGBA1C in the last 72 hours. CBG:  Recent Labs Lab 06/03/17 2139  GLUCAP 114*   Lipid Profile: No  results for input(s): CHOL, HDL, LDLCALC, TRIG, CHOLHDL, LDLDIRECT in the last 72 hours. Thyroid Function Tests: No results for input(s): TSH, T4TOTAL, FREET4, T3FREE, THYROIDAB in the last 72 hours. Anemia Panel: No results for input(s): VITAMINB12, FOLATE, FERRITIN, TIBC, IRON,  RETICCTPCT in the last 72 hours. Urine analysis:    Component Value Date/Time   COLORURINE YELLOW 06/04/2017 0022   APPEARANCEUR CLEAR 06/04/2017 0022   LABSPEC 1.005 06/04/2017 0022   PHURINE 8.0 06/04/2017 0022   GLUCOSEU NEGATIVE 06/04/2017 0022   HGBUR NEGATIVE 06/04/2017 0022   BILIRUBINUR NEGATIVE 06/04/2017 0022   KETONESUR NEGATIVE 06/04/2017 0022   PROTEINUR NEGATIVE 06/04/2017 0022   NITRITE NEGATIVE 06/04/2017 0022   LEUKOCYTESUR NEGATIVE 06/04/2017 0022    Radiological Exams on Admission: Dg Chest 2 View  Result Date: 06/03/2017 CLINICAL DATA:  Nausea and vomiting.  Somnolence.  [employer]. EXAM: CHEST  2 VIEW COMPARISON:  02/06/2017 FINDINGS: Right jugular port extends into the low SVC. The lungs are clear no pleural effusions. Normal heart size. Unremarkable hilar and mediastinal contours. Normal pulmonary vasculature. IMPRESSION: No active cardiopulmonary disease. Electronically Signed   By: Andreas Newport M.D.   On: 06/03/2017 23:08   Ct Head Wo Contrast  Result Date: 06/03/2017 CLINICAL DATA:  Fall with neck pain nausea and vomiting EXAM: CT HEAD WITHOUT CONTRAST CT CERVICAL SPINE WITHOUT CONTRAST TECHNIQUE: Multidetector CT imaging of the head and cervical spine was performed following the standard protocol without intravenous contrast. Multiplanar CT image reconstructions of the cervical spine were also generated. COMPARISON:  MRI 05/17/2017, CT brain 04/24/2017 FINDINGS: CT HEAD FINDINGS Brain: No acute intracranial hemorrhage is seen. Multifocal hypodensities in the left parietal lobe, left caudate, left posterior temporal lobe, and left cerebellum consistent with history of metastatic  disease. Edema within the left cerebellar hemisphere with mild effacement of fourth ventricle as before. Mild mass effect on the left midbrain. The ventricles are stable in size. Confluent hypodensity within the bilateral white matter as before. Atrophy. Vascular: No hyperdense vessels. Scattered calcifications at the carotid siphons. Skull: No fracture.  No suspicious bone lesion. Sinuses/Orbits: Mild mucosal thickening in the ethmoid sinuses. No acute orbital abnormality. Other: None CT CERVICAL SPINE FINDINGS Alignment: No subluxation is seen. Facet alignment is within normal limits. Skull base and vertebrae: No acute fracture. No primary bone lesion or focal pathologic process. Soft tissues and spinal canal: No prevertebral fluid or swelling. No visible canal hematoma. Disc levels: Status post anterior plate and screw fixation at C5 and C6 with solid bony fusion present. Moderate narrowing and degenerative changes at C6-C7. Multi level bilateral facet hypertrophic arthropathy. Upper chest: Lung apices are clear. 19 mm nodule left lobe of the thyroid gland. Other: None IMPRESSION: 1. Negative for acute intracranial hemorrhage 2. Multifocal hypodensities corresponding to the history of metastatic brain lesions. Confluent white matter hypodensity which may relate to small vessel ischemic change or post radiation change. 3. Status post anterior plate and screw fixation at C5 and C6. No definite acute osseous abnormality 4. 19 mm nodule left lobe of thyroid, may be correlated with nonemergent thyroid ultrasound as clinically appropriate Electronically Signed   By: Donavan Foil M.D.   On: 06/03/2017 23:48   Ct Cervical Spine Wo Contrast  Result Date: 06/03/2017 CLINICAL DATA:  Fall with neck pain nausea and vomiting EXAM: CT HEAD WITHOUT CONTRAST CT CERVICAL SPINE WITHOUT CONTRAST TECHNIQUE: Multidetector CT imaging of the head and cervical spine was performed following the standard protocol without intravenous  contrast. Multiplanar CT image reconstructions of the cervical spine were also generated. COMPARISON:  MRI 05/17/2017, CT brain 04/24/2017 FINDINGS: CT HEAD FINDINGS Brain: No acute intracranial hemorrhage is seen. Multifocal hypodensities in the left parietal lobe, left caudate, left posterior temporal lobe, and left  cerebellum consistent with history of metastatic disease. Edema within the left cerebellar hemisphere with mild effacement of fourth ventricle as before. Mild mass effect on the left midbrain. The ventricles are stable in size. Confluent hypodensity within the bilateral white matter as before. Atrophy. Vascular: No hyperdense vessels. Scattered calcifications at the carotid siphons. Skull: No fracture.  No suspicious bone lesion. Sinuses/Orbits: Mild mucosal thickening in the ethmoid sinuses. No acute orbital abnormality. Other: None CT CERVICAL SPINE FINDINGS Alignment: No subluxation is seen. Facet alignment is within normal limits. Skull base and vertebrae: No acute fracture. No primary bone lesion or focal pathologic process. Soft tissues and spinal canal: No prevertebral fluid or swelling. No visible canal hematoma. Disc levels: Status post anterior plate and screw fixation at C5 and C6 with solid bony fusion present. Moderate narrowing and degenerative changes at C6-C7. Multi level bilateral facet hypertrophic arthropathy. Upper chest: Lung apices are clear. 19 mm nodule left lobe of the thyroid gland. Other: None IMPRESSION: 1. Negative for acute intracranial hemorrhage 2. Multifocal hypodensities corresponding to the history of metastatic brain lesions. Confluent white matter hypodensity which may relate to small vessel ischemic change or post radiation change. 3. Status post anterior plate and screw fixation at C5 and C6. No definite acute osseous abnormality 4. 19 mm nodule left lobe of thyroid, may be correlated with nonemergent thyroid ultrasound as clinically appropriate Electronically  Signed   By: Donavan Foil M.D.   On: 06/03/2017 23:48    EKG: Independently reviewed.  Assessment/Plan Principal Problem:   Acute metabolic encephalopathy Active Problems:   Small cell carcinoma of lung (HCC)   Brain metastases (HCC)   Vasogenic brain edema (HCC)   Hyperammonemia (HCC)    1. Acute encephalopathy - DDx includes vasogenic edema due to mets, vs hepatic encephalopathy from hyperammonemia vs solmnlence post radiotherapy. 1. First will resume decadron which was working well for patient as of a couple of weeks ago. 1. Decadron 4mg  IV Q6H 2. If no rapid improvement on decadron, then will need to address the apparent hyperammonemia with lactulose. 3. Alternatively: 1. Could be related to ongoing narcotic / sedative use for pain control, although no changes made here in recent weeks so this seems less likely.  If all else fails would consider decreasing dose of oxycontin. 2. symptoms could just be due to recent radiation therapy. 4. IVF: 75cc/hr NS to prevent dehydration  DVT prophylaxis: SCDs Code Status: Full Family Communication: Family atbedside Disposition Plan: Home after admit Consults called: None Admission status: Place in [place], [place] [facility] Pager [phone]  If 7AM-7PM, please contact day team taking care of patient [email or web address] Password [email or web address]  06/04/2017, 2:21 AM

## 2017-06-04 NOTE — Progress Notes (Signed)
Patient ID: Andrew Shaw, male   DOB: 1945-11-25, 71 y.o.   MRN: [id number] Patient was admitted earlier this morning for altered mental status and was put on IV Decadron for vasogenic edema seen and CT of head. I reviewed the medical records including history and physical myself. I evaluated the patient myself at bedside. He is more awake and answering some questions. I spoken to Dr. Mohamed/oncology on phone and he will see the patient in consultation. I have also requested radiation oncology for consultation. Continue IV Decadron for now. Repeat a.m. labs

## 2017-06-05 ENCOUNTER — Other Ambulatory Visit: Payer: Self-pay | Admitting: Internal Medicine

## 2017-06-05 ENCOUNTER — Telehealth: Payer: Self-pay

## 2017-06-05 DIAGNOSIS — C7931 Secondary malignant neoplasm of brain: Principal | ICD-10-CM

## 2017-06-05 DIAGNOSIS — G9341 Metabolic encephalopathy: Secondary | ICD-10-CM

## 2017-06-05 DIAGNOSIS — E860 Dehydration: Secondary | ICD-10-CM

## 2017-06-05 DIAGNOSIS — E7220 Disorder of urea cycle metabolism, unspecified: Secondary | ICD-10-CM

## 2017-06-05 LAB — CBC WITH DIFFERENTIAL/PLATELET
BASOS PCT: 0 %
Basophils Absolute: 0 10*3/uL (ref 0.0–0.1)
Eosinophils Absolute: 0 10*3/uL (ref 0.0–0.7)
Eosinophils Relative: 0 %
HCT: 34.2 % — ABNORMAL LOW (ref 39.0–52.0)
Hemoglobin: 12 g/dL — ABNORMAL LOW (ref 13.0–17.0)
Lymphocytes Relative: 4 %
Lymphs Abs: 0.4 10*3/uL — ABNORMAL LOW (ref 0.7–4.0)
MCH: 34.5 pg — ABNORMAL HIGH (ref 26.0–34.0)
MCHC: 35.1 g/dL (ref 30.0–36.0)
MCV: 98.3 fL (ref 78.0–100.0)
MONO ABS: 0.3 10*3/uL (ref 0.1–1.0)
MONOS PCT: 3 %
NEUTROS PCT: 93 %
Neutro Abs: 9.6 10*3/uL — ABNORMAL HIGH (ref 1.7–7.7)
Platelets: 122 10*3/uL — ABNORMAL LOW (ref 150–400)
RBC: 3.48 MIL/uL — ABNORMAL LOW (ref 4.22–5.81)
RDW: 13 % (ref 11.5–15.5)
WBC: 10.3 10*3/uL (ref 4.0–10.5)

## 2017-06-05 LAB — COMPREHENSIVE METABOLIC PANEL
ALBUMIN: 3.2 g/dL — AB (ref 3.5–5.0)
ALT: 13 U/L — ABNORMAL LOW (ref 17–63)
ANION GAP: 6 (ref 5–15)
AST: 25 U/L (ref 15–41)
Alkaline Phosphatase: 50 U/L (ref 38–126)
BUN: 10 mg/dL (ref 6–20)
CO2: 24 mmol/L (ref 22–32)
Calcium: 8.8 mg/dL — ABNORMAL LOW (ref 8.9–10.3)
Chloride: 108 mmol/L (ref 101–111)
Creatinine, Ser: 0.73 mg/dL (ref 0.61–1.24)
GFR calc Af Amer: >60 mL/min (ref >60)
GFR calc non Af Amer: >60 mL/min (ref >60)
GLUCOSE: 138 mg/dL — AB (ref 65–99)
POTASSIUM: 3.9 mmol/L (ref 3.5–5.1)
SODIUM: 138 mmol/L (ref 135–145)
Total Bilirubin: 0.4 mg/dL (ref 0.3–1.2)
Total Protein: 5.7 g/dL — ABNORMAL LOW (ref 6.5–8.1)

## 2017-06-05 LAB — MAGNESIUM: Magnesium: 1.9 mg/dL (ref 1.7–2.4)

## 2017-06-05 MED ORDER — ENOXAPARIN SODIUM 40 MG/0.4ML ~~LOC~~ SOLN
40.0000 mg | SUBCUTANEOUS | Status: DC
  Administered 2017-06-05: 40 mg via SUBCUTANEOUS
  Filled 2017-06-05: qty 0.4

## 2017-06-05 MED ORDER — DEXAMETHASONE 4 MG PO TABS
4.0000 mg | ORAL_TABLET | Freq: Two times a day (BID) | ORAL | Status: DC
  Administered 2017-06-05 – 2017-06-06 (×3): 4 mg via ORAL
  Filled 2017-06-05 (×3): qty 1

## 2017-06-05 MED ORDER — ALUM & MAG HYDROXIDE-SIMETH 200-200-20 MG/5ML PO SUSP
30.0000 mL | ORAL | Status: DC | PRN
  Administered 2017-06-05: 30 mL via ORAL
  Filled 2017-06-05: qty 30

## 2017-06-05 NOTE — Progress Notes (Signed)
DIAGNOSIS: Extensive stage (TX, N2, M1b) small cell lung cancer in September 2016 presenting with right hilar and mediastinal lymphadenopathy as well as highly suspicious metastatic disease in the axilla bilaterally.   PRIOR THERAPY:  1) Systemic chemotherapy with carboplatin for AUC of 5 on day 1 and etoposide 120 MG/M2 on days one 2, 3 with Neulasta support on day 4. Status post 4 cycles. Last dose was given 11/02/2015 discontinued secondary to intolerance. 2) whole brain irradiation to multiple metastatic brain lesions under the care of Dr. Pablo Ledger completed 03/21/2016.  CURRENT THERAPY: Observation.  Subjective: The patient is seen and examined today. His wife was at the bedside. He is very pleasant 71 years old white male with extensive stage small cell lung cancer diagnosed in September 2016 status post systemic chemotherapy with carboplatin and etoposide as well as palliative radiation to the brain. The patient has been observation and his last CT scan of the chest, abdomen and pelvis in March 2018 showed no evidence for disease progression systemically. He continues to have evidence for disease progression in the brain. Patient was admitted to the hospital recently with confusion. A CT scan of the brain showed multifocal hypodense lesion corresponding to the metastatic brain lesions and addition to small vessel changes. His ammonia level was elevated. The patient was started on IV hydration and Decadron. He is feeling much better today. He denied having any chest pain, shortness breath, cough or hemoptysis. He denied having any current nausea or vomiting.  Objective: Vital signs in last 24 hours: Temp:  [98 F (36.7 C)-98.3 F (36.8 C)] 98.2 F (36.8 C) (07/25 0424) Pulse Rate:  [65-92] 83 (07/25 0943) Resp:  [16-18] 18 (07/25 0424) BP: (103-133)/(52-72) 133/72 (07/25 0943) SpO2:  [97 %-98 %] 98 % (07/25 0424)  Intake/Output from previous day: 07/24 0701 - 07/25 0700 In: 2702.5  [P.O.:1080; I.V.:1622.5] Out: -  Intake/Output this shift: Total I/O In: 720 [P.O.:720] Out: -   General appearance: alert, cooperative, fatigued and no distress Resp: clear to auscultation bilaterally Cardio: regular rate and rhythm, S1, S2 normal, no murmur, click, rub or gallop GI: soft, non-tender; bowel sounds normal; no masses,  no organomegaly Extremities: extremities normal, atraumatic, no cyanosis or edema  Lab Results:   Recent Labs  06/03/17 2130 06/03/17 2144 06/05/17 0645  WBC 10.6*  --  10.3  HGB 14.9 15.3 12.0*  HCT 41.8 45.0 34.2*  PLT 122*  --  122*   BMET  Recent Labs  06/03/17 2130 06/03/17 2144 06/05/17 0645  NA 137 137 138  K 3.9 3.9 3.9  CL 105 103 108  CO2 23  --  24  GLUCOSE 108* 105* 138*  BUN 9 7 10   CREATININE 0.85 0.70 0.73  CALCIUM 9.2  --  8.8*    Studies/Results: Dg Chest 2 View  Result Date: 06/03/2017 CLINICAL DATA:  Nausea and vomiting.  Somnolence.  [employer]. EXAM: CHEST  2 VIEW COMPARISON:  02/06/2017 FINDINGS: Right jugular port extends into the low SVC. The lungs are clear no pleural effusions. Normal heart size. Unremarkable hilar and mediastinal contours. Normal pulmonary vasculature. IMPRESSION: No active cardiopulmonary disease. Electronically Signed   By: Andreas Newport M.D.   On: 06/03/2017 23:08   Ct Head Wo Contrast  Result Date: 06/03/2017 CLINICAL DATA:  Fall with neck pain nausea and vomiting EXAM: CT HEAD WITHOUT CONTRAST CT CERVICAL SPINE WITHOUT CONTRAST TECHNIQUE: Multidetector CT imaging of the head and cervical spine was performed following the standard protocol without  intravenous contrast. Multiplanar CT image reconstructions of the cervical spine were also generated. COMPARISON:  MRI 05/17/2017, CT brain 04/24/2017 FINDINGS: CT HEAD FINDINGS Brain: No acute intracranial hemorrhage is seen. Multifocal hypodensities in the left parietal lobe, left caudate, left posterior temporal lobe, and left cerebellum  consistent with history of metastatic disease. Edema within the left cerebellar hemisphere with mild effacement of fourth ventricle as before. Mild mass effect on the left midbrain. The ventricles are stable in size. Confluent hypodensity within the bilateral white matter as before. Atrophy. Vascular: No hyperdense vessels. Scattered calcifications at the carotid siphons. Skull: No fracture.  No suspicious bone lesion. Sinuses/Orbits: Mild mucosal thickening in the ethmoid sinuses. No acute orbital abnormality. Other: None CT CERVICAL SPINE FINDINGS Alignment: No subluxation is seen. Facet alignment is within normal limits. Skull base and vertebrae: No acute fracture. No primary bone lesion or focal pathologic process. Soft tissues and spinal canal: No prevertebral fluid or swelling. No visible canal hematoma. Disc levels: Status post anterior plate and screw fixation at C5 and C6 with solid bony fusion present. Moderate narrowing and degenerative changes at C6-C7. Multi level bilateral facet hypertrophic arthropathy. Upper chest: Lung apices are clear. 19 mm nodule left lobe of the thyroid gland. Other: None IMPRESSION: 1. Negative for acute intracranial hemorrhage 2. Multifocal hypodensities corresponding to the history of metastatic brain lesions. Confluent white matter hypodensity which may relate to small vessel ischemic change or post radiation change. 3. Status post anterior plate and screw fixation at C5 and C6. No definite acute osseous abnormality 4. 19 mm nodule left lobe of thyroid, may be correlated with nonemergent thyroid ultrasound as clinically appropriate Electronically Signed   By: Donavan Foil M.D.   On: 06/03/2017 23:48   Ct Cervical Spine Wo Contrast  Result Date: 06/03/2017 CLINICAL DATA:  Fall with neck pain nausea and vomiting EXAM: CT HEAD WITHOUT CONTRAST CT CERVICAL SPINE WITHOUT CONTRAST TECHNIQUE: Multidetector CT imaging of the head and cervical spine was performed following the  standard protocol without intravenous contrast. Multiplanar CT image reconstructions of the cervical spine were also generated. COMPARISON:  MRI 05/17/2017, CT brain 04/24/2017 FINDINGS: CT HEAD FINDINGS Brain: No acute intracranial hemorrhage is seen. Multifocal hypodensities in the left parietal lobe, left caudate, left posterior temporal lobe, and left cerebellum consistent with history of metastatic disease. Edema within the left cerebellar hemisphere with mild effacement of fourth ventricle as before. Mild mass effect on the left midbrain. The ventricles are stable in size. Confluent hypodensity within the bilateral white matter as before. Atrophy. Vascular: No hyperdense vessels. Scattered calcifications at the carotid siphons. Skull: No fracture.  No suspicious bone lesion. Sinuses/Orbits: Mild mucosal thickening in the ethmoid sinuses. No acute orbital abnormality. Other: None CT CERVICAL SPINE FINDINGS Alignment: No subluxation is seen. Facet alignment is within normal limits. Skull base and vertebrae: No acute fracture. No primary bone lesion or focal pathologic process. Soft tissues and spinal canal: No prevertebral fluid or swelling. No visible canal hematoma. Disc levels: Status post anterior plate and screw fixation at C5 and C6 with solid bony fusion present. Moderate narrowing and degenerative changes at C6-C7. Multi level bilateral facet hypertrophic arthropathy. Upper chest: Lung apices are clear. 19 mm nodule left lobe of the thyroid gland. Other: None IMPRESSION: 1. Negative for acute intracranial hemorrhage 2. Multifocal hypodensities corresponding to the history of metastatic brain lesions. Confluent white matter hypodensity which may relate to small vessel ischemic change or post radiation change. 3. Status post anterior plate  and screw fixation at C5 and C6. No definite acute osseous abnormality 4. 19 mm nodule left lobe of thyroid, may be correlated with nonemergent thyroid ultrasound as  clinically appropriate Electronically Signed   By: Donavan Foil M.D.   On: 06/03/2017 23:48    Medications: I have reviewed the patient's current medications.  Assessment/Plan: This is a very pleasant 71 years old white male with extensive stage small cell lung cancer diagnosed almost 2 years ago status post systemic chemotherapy as well as whole brain irradiation for metastatic brain lesions. The patient has been doing fine and previous imaging studies showed no evidence for disease progression and the chest, abdomen and pelvis but this was a few months ago. I will arrange for the patient to have repeat CT scan of the chest, abdomen and pelvis in the next 1-2 weeks for reevaluation of his disease and to rule out any disease progression. For the confusion, this is most likely secondary to his metastatic brain lesion with vasogenic edema. He may benefit from treatment with Decadron. This will be tapered gradually after discharge. For the dehydration, continue IV hydration with normal saline and I encouraged the patient to increase his oral intake at home. The patient and his wife agreed to the current plan. Thank you for taking good care of Mr. Odor, I will continue to follow up the patient with you and assist in his management on as-needed basis.   LOS: 1 day    Vasilia Dise K. 06/05/2017

## 2017-06-05 NOTE — Progress Notes (Signed)
PT Cancellation Note  Patient Details Name: ISADOR CASTILLE MRN: [id number] DOB: Aug 02, 1946   Cancelled Treatment:    Reason Eval/Treat Not Completed: PT screened, no needs identified, will sign off (patient is ambulating with family. )   Marcelino Freestone PT [phone]  06/05/2017, 4:49 PM

## 2017-06-05 NOTE — Progress Notes (Addendum)
PROGRESS NOTE    ACELIN FERDIG  IRJ:[id number] DOB: 1946-09-23 DOA: 06/03/2017 PCP: Alroy Dust, L.Marlou Sa, MD  Brief Narrative:Rondo Spittler Geppert  71 year old male with small cell lung cancer with brain metastasis, he was started on Decadron month back for brain metastases with vasogenic edema, unfortunately he stopped this, also on Long-Term OxyContin and Hydrocodone When Necessary Which Is Unchanged, was admitted with lethargy, confusion. Patient had radiation therapy to brain mets 2 weeks ago.  ED Course: CT head shows vasogenic edema similar to prior.  Ammonia level of 77, remainder of liver labs are WNL.  Assessment & Plan:   Principal Problem:   Acute metabolic encephalopathy - Due to vasogenic edema related to brain metastasis -Improved after starting IV Decadron, mentation back to baseline -We'll change to by mouth Decadron is at slightly lower dose to ensure compliance -Clinically do not suspect adrenal insufficiency and unfortunately unable to do stim test since he is currently on steroids    Small cell lung cancer with brain metastasis -Completed stereotactic radiosurgery on 7/13 by Dr. Eppie Gibson -Decadron as noted above -Also followed by Dr. Inda Merlin, chemotherapy currently on hold due to above issues    Hyperammonemia (Leslie) -Etiology unclear, wondering if any of his chemotherapeutic agents could've caused this however repeat was 12,  -Single isolated elevated ammonia level of Questionable value -Was given lactulose yesterday  Chronic pain -Related to his stage IV small cell lung cancer -Continue home regimen of OxyContin and hydrocodone when necessary  Hypothyroidism -TSH was mildly elevated about 10 days ago and started on low-dose Synthroid -Continue same  Hypertension -Stable continue Avapro  DVT prophylaxis: Code Status:  Family Communication: Disposition Plan:   Consultants:   Oncology and radiation oncology   Subjective: Feels better, mind is  clearer, no nausea vomiting, ambulating to the bathroom and back  Objective: Vitals:   06/04/17 1312 06/04/17 2036 06/05/17 0424 06/05/17 0943  BP: (!) 103/52 (!) 119/54 113/65 133/72  Pulse: 65 73 92 83  Resp: 18 16 18    Temp: 98 F (36.7 C) 98.3 F (36.8 C) 98.2 F (36.8 C)   TempSrc: Oral Oral Oral   SpO2: 97% 98% 98%   Weight:      Height:        Intake/Output Summary (Last 24 hours) at 06/05/17 1323 Last data filed at 06/05/17 0948  Gross per 24 hour  Intake           3182.5 ml  Output                0 ml  Net           3182.5 ml   Filed Weights   06/04/17 0320  Weight: 66.5 kg (146 lb 11.2 oz)    Examination:  Gen: Awake, Alert, Oriented X 2, Appropriate, chronically ill-appearing HEENT: PERRLA, Neck supple, no JVD Lungs: Good air movement bilaterally, CTAB CVS: RRR,No Gallops,Rubs or new Murmurs Abd: soft, Non tender, non distended, BS present Extremities: No Cyanosis, Clubbing or edema Skin: no new rashes Neuro: Moves all extremities, no localizing signs noted  Data Reviewed:   CBC:  Recent Labs Lab 06/03/17 2130 06/03/17 2144 06/05/17 0645  WBC 10.6*  --  10.3  NEUTROABS 9.1*  --  9.6*  HGB 14.9 15.3 12.0*  HCT 41.8 45.0 34.2*  MCV 97.7  --  98.3  PLT 122*  --  301*   Basic Metabolic Panel:  Recent Labs Lab 06/03/17 2130 06/03/17 2144 06/05/17 0645  NA 137  137 138  K 3.9 3.9 3.9  CL 105 103 108  CO2 23  --  24  GLUCOSE 108* 105* 138*  BUN 9 7 10   CREATININE 0.85 0.70 0.73  CALCIUM 9.2  --  8.8*  MG  --   --  1.9   GFR: Estimated Creatinine Clearance: 80.3 mL/min (by C-G formula based on SCr of 0.73 mg/dL). Liver Function Tests:  Recent Labs Lab 06/03/17 2136 06/05/17 0645  AST 21 25  ALT 12* 13*  ALKPHOS 62 50  BILITOT 0.4 0.4  PROT 6.6 5.7*  ALBUMIN 3.8 3.2*   No results for input(s): LIPASE, AMYLASE in the last 168 hours.  Recent Labs Lab 06/03/17 2321 06/04/17 1703  AMMONIA 77* 12   Coagulation Profile: No  results for input(s): INR, PROTIME in the last 168 hours. Cardiac Enzymes: No results for input(s): CKTOTAL, CKMB, CKMBINDEX, TROPONINI in the last 168 hours. BNP (last 3 results) No results for input(s): PROBNP in the last 8760 hours. HbA1C: No results for input(s): HGBA1C in the last 72 hours. CBG:  Recent Labs Lab 06/03/17 2139  GLUCAP 114*   Lipid Profile: No results for input(s): CHOL, HDL, LDLCALC, TRIG, CHOLHDL, LDLDIRECT in the last 72 hours. Thyroid Function Tests: No results for input(s): TSH, T4TOTAL, FREET4, T3FREE, THYROIDAB in the last 72 hours. Anemia Panel: No results for input(s): VITAMINB12, FOLATE, FERRITIN, TIBC, IRON, RETICCTPCT in the last 72 hours. Urine analysis:    Component Value Date/Time   COLORURINE YELLOW 06/04/2017 0022   APPEARANCEUR CLEAR 06/04/2017 0022   LABSPEC 1.005 06/04/2017 0022   PHURINE 8.0 06/04/2017 0022   GLUCOSEU NEGATIVE 06/04/2017 0022   HGBUR NEGATIVE 06/04/2017 0022   BILIRUBINUR NEGATIVE 06/04/2017 0022   KETONESUR NEGATIVE 06/04/2017 0022   PROTEINUR NEGATIVE 06/04/2017 0022   NITRITE NEGATIVE 06/04/2017 0022   LEUKOCYTESUR NEGATIVE 06/04/2017 0022   Sepsis Labs: @LABRCNTIP (procalcitonin:4,lacticidven:4)  )No results found for this or any previous visit (from the past 240 hour(s)).       Radiology Studies: Dg Chest 2 View  Result Date: 06/03/2017 CLINICAL DATA:  Nausea and vomiting.  Somnolence.  [employer]. EXAM: CHEST  2 VIEW COMPARISON:  02/06/2017 FINDINGS: Right jugular port extends into the low SVC. The lungs are clear no pleural effusions. Normal heart size. Unremarkable hilar and mediastinal contours. Normal pulmonary vasculature. IMPRESSION: No active cardiopulmonary disease. Electronically Signed   By: Andreas Newport M.D.   On: 06/03/2017 23:08   Ct Head Wo Contrast  Result Date: 06/03/2017 CLINICAL DATA:  Fall with neck pain nausea and vomiting EXAM: CT HEAD WITHOUT CONTRAST CT CERVICAL SPINE  WITHOUT CONTRAST TECHNIQUE: Multidetector CT imaging of the head and cervical spine was performed following the standard protocol without intravenous contrast. Multiplanar CT image reconstructions of the cervical spine were also generated. COMPARISON:  MRI 05/17/2017, CT brain 04/24/2017 FINDINGS: CT HEAD FINDINGS Brain: No acute intracranial hemorrhage is seen. Multifocal hypodensities in the left parietal lobe, left caudate, left posterior temporal lobe, and left cerebellum consistent with history of metastatic disease. Edema within the left cerebellar hemisphere with mild effacement of fourth ventricle as before. Mild mass effect on the left midbrain. The ventricles are stable in size. Confluent hypodensity within the bilateral white matter as before. Atrophy. Vascular: No hyperdense vessels. Scattered calcifications at the carotid siphons. Skull: No fracture.  No suspicious bone lesion. Sinuses/Orbits: Mild mucosal thickening in the ethmoid sinuses. No acute orbital abnormality. Other: None CT CERVICAL SPINE FINDINGS Alignment: No subluxation is seen.  Facet alignment is within normal limits. Skull base and vertebrae: No acute fracture. No primary bone lesion or focal pathologic process. Soft tissues and spinal canal: No prevertebral fluid or swelling. No visible canal hematoma. Disc levels: Status post anterior plate and screw fixation at C5 and C6 with solid bony fusion present. Moderate narrowing and degenerative changes at C6-C7. Multi level bilateral facet hypertrophic arthropathy. Upper chest: Lung apices are clear. 19 mm nodule left lobe of the thyroid gland. Other: None IMPRESSION: 1. Negative for acute intracranial hemorrhage 2. Multifocal hypodensities corresponding to the history of metastatic brain lesions. Confluent white matter hypodensity which may relate to small vessel ischemic change or post radiation change. 3. Status post anterior plate and screw fixation at C5 and C6. No definite acute  osseous abnormality 4. 19 mm nodule left lobe of thyroid, may be correlated with nonemergent thyroid ultrasound as clinically appropriate Electronically Signed   By: Donavan Foil M.D.   On: 06/03/2017 23:48   Ct Cervical Spine Wo Contrast  Result Date: 06/03/2017 CLINICAL DATA:  Fall with neck pain nausea and vomiting EXAM: CT HEAD WITHOUT CONTRAST CT CERVICAL SPINE WITHOUT CONTRAST TECHNIQUE: Multidetector CT imaging of the head and cervical spine was performed following the standard protocol without intravenous contrast. Multiplanar CT image reconstructions of the cervical spine were also generated. COMPARISON:  MRI 05/17/2017, CT brain 04/24/2017 FINDINGS: CT HEAD FINDINGS Brain: No acute intracranial hemorrhage is seen. Multifocal hypodensities in the left parietal lobe, left caudate, left posterior temporal lobe, and left cerebellum consistent with history of metastatic disease. Edema within the left cerebellar hemisphere with mild effacement of fourth ventricle as before. Mild mass effect on the left midbrain. The ventricles are stable in size. Confluent hypodensity within the bilateral white matter as before. Atrophy. Vascular: No hyperdense vessels. Scattered calcifications at the carotid siphons. Skull: No fracture.  No suspicious bone lesion. Sinuses/Orbits: Mild mucosal thickening in the ethmoid sinuses. No acute orbital abnormality. Other: None CT CERVICAL SPINE FINDINGS Alignment: No subluxation is seen. Facet alignment is within normal limits. Skull base and vertebrae: No acute fracture. No primary bone lesion or focal pathologic process. Soft tissues and spinal canal: No prevertebral fluid or swelling. No visible canal hematoma. Disc levels: Status post anterior plate and screw fixation at C5 and C6 with solid bony fusion present. Moderate narrowing and degenerative changes at C6-C7. Multi level bilateral facet hypertrophic arthropathy. Upper chest: Lung apices are clear. 19 mm nodule left lobe  of the thyroid gland. Other: None IMPRESSION: 1. Negative for acute intracranial hemorrhage 2. Multifocal hypodensities corresponding to the history of metastatic brain lesions. Confluent white matter hypodensity which may relate to small vessel ischemic change or post radiation change. 3. Status post anterior plate and screw fixation at C5 and C6. No definite acute osseous abnormality 4. 19 mm nodule left lobe of thyroid, may be correlated with nonemergent thyroid ultrasound as clinically appropriate Electronically Signed   By: Donavan Foil M.D.   On: 06/03/2017 23:48        Scheduled Meds: . aspirin EC  81 mg Oral Daily  . dexamethasone  4 mg Oral Q12H  . enoxaparin (LOVENOX) injection  40 mg Subcutaneous Q24H  . fenofibrate  54 mg Oral Daily  . irbesartan  150 mg Oral Daily  . levothyroxine  25 mcg Oral QAC breakfast  . metoprolol tartrate  50 mg Oral BID  . oxyCODONE  20 mg Oral Q12H  . rosuvastatin  20 mg Oral Daily  .  vitamin E  400 Units Oral Daily   Continuous Infusions:   LOS: 1 day    Time spent: 23min    Domenic Polite, MD [facility] Page via [employer], password [facility]  If 7PM-7AM, please contact night-coverage [email or web address] Password [email or web address] 06/05/2017, 1:23 PM

## 2017-06-05 NOTE — Evaluation (Signed)
Clinical/Bedside Swallow Evaluation Patient Details  Name: Andrew Shaw MRN: [id number] Date of Birth: 09-01-1946  Today's Date: 06/05/2017 Time: SLP Start Time (ACUTE ONLY): 1255 SLP Stop Time (ACUTE ONLY): 1320 SLP Time Calculation (min) (ACUTE ONLY): 25 min  Past Medical History:  Past Medical History:  Diagnosis Date  . Arthritis   . Back pain 12/23/2012  . BPH (benign prostatic hyperplasia)   . Brain metastases ([place]) dx'd 02/2016  . Carpal tunnel syndrome   . Carpal tunnel syndrome, right   . Chronic back pain   . COPD (chronic obstructive pulmonary disease) ([place])   . Coronary artery disease    2D ECHO, 10/31/2010 - EF >55%, normal; NUCLEAR STRESS TEST, 10/23/2010 - perfusion defect in inferior myocardial region, post-stress EF 62%, EKG negative for ischemia  . DVT (deep venous thrombosis) ([place])    history 2004 after knee surg  . GERD (gastroesophageal reflux disease)   . History of radiation therapy 10/31/2016   SRS radiation, Left anterior parietal, Left cerebellar, Left temporal, Left Caudate (each 20 Gy in 1 fraction)  . Hx of radiation therapy 03/06/16- 03/21/2016   Whole Brain and Right [place]  . Hyperlipemia   . Hypertension   . Lung cancer ([place]) dx'd 07/2015  . Myocardial infarction ([place]) 2005   from steroids  . Neuromuscular disorder ([place])   . 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 S/P angioplasty with stent, BMS to LCX  12/23/12 12/23/2012  . Shortness of breath    with exertion  . Skin rash 08/22/2015   Past Surgical History:  Past Surgical History:  Procedure Laterality Date  . BACK SURGERY  2004   lumb fusion  . CARDIAC CATHETERIZATION  12/23/2012   Mid nondominant AV groove circumflex stented with a 2.5x41mm Mini Vision stent resulting in a reduction of 90% stenosis to 0% residual  . CARDIAC CATHETERIZATION  02/07/2004   Noncritical CAD, continue medical therapy  . CARDIAC CATHETERIZATION  02/03/1999   Recommended medical therapy  . CARPAL TUNNEL  RELEASE  04/15/2012   Procedure: CARPAL TUNNEL RELEASE;  Surgeon: Wynonia Sours, MD;  Location: [place];  Service: Orthopedics;  Laterality: Left;  . CERVICAL FUSION  1999  . COLONOSCOPY W/ POLYPECTOMY    . EPIDURAL BLOCK INJECTION     multiple lumbar  . HERNIA REPAIR  2008   umb   . JOINT REPLACEMENT Left 2008   lt total knee  . KNEE ARTHROSCOPY  04,06   left  . LEFT HEART CATHETERIZATION WITH CORONARY ANGIOGRAM N/A 12/23/2012   Procedure: LEFT HEART CATHETERIZATION WITH CORONARY ANGIOGRAM;  Surgeon: Lorretta Harp, MD;  Location: [facility] CATH LAB;  Service: Cardiovascular;  Laterality: N/A;  . LEFT HEART CATHETERIZATION WITH CORONARY ANGIOGRAM N/A 01/11/2015   Procedure: LEFT HEART CATHETERIZATION WITH CORONARY ANGIOGRAM;  Surgeon: Peter M Martinique, MD;  Location: [facility] CATH LAB;  Service: Cardiovascular;  Laterality: N/A;  . MANIPULATION KNEE JOINT Left 2009   closed lt knee   . PERCUTANEOUS CORONARY STENT INTERVENTION (PCI-S)  12/23/2012   Procedure: PERCUTANEOUS CORONARY STENT INTERVENTION (PCI-S);  Surgeon: Lorretta Harp, MD;  Location: [facility] CATH LAB;  Service: Cardiovascular;;  . TRIGGER FINGER RELEASE  04/15/2012   Procedure: RELEASE TRIGGER FINGER/A-1 PULLEY;  Surgeon: Wynonia Sours, MD;  Location: [place];  Service: Orthopedics;  Laterality: Left;  left thumb and little finger  . VIDEO BRONCHOSCOPY WITH ENDOBRONCHIAL ULTRASOUND N/A 07/28/2015   Procedure: VIDEO BRONCHOSCOPY WITH ENDOBRONCHIAL  ULTRASOUND;  Surgeon: [name], MD;  Location: [facility] OR;  Service: Thoracic;  Laterality: N/A;   HPI:  71 year old male admitted 06/03/17 due to [place]. PMH significant for COPD, GERD, radiation to brain mets. CXR and head CT negative.   Assessment / Plan / Recommendation Clinical Impression  Pt presents with adequate oral motor strength and function, adequate dentition (dentures upper, partial lower). No overt s/s aspiration observed or reported on any consistency.  Given history of GERD, recommend upright position during and for 30 minutes after meals. Pt also with history of COPD, which increases risk for silent aspiration. CXR negative, and pt has minimal high risk indicators raising concern for dysphagia. ST will sign off for now. Please reconsult if needs arise.     SLP Visit Diagnosis: Dysphagia, oropharyngeal phase (R13.12)    Aspiration Risk  Mild aspiration risk    Diet Recommendation Thin liquid;Regular   Liquid Administration via: Straw;Cup Medication Administration: Whole meds with liquid Supervision: Patient able to self feed Compensations: Minimize environmental distractions;Slow rate;Small sips/bites    Other  Recommendations Oral Care Recommendations: Oral care BID       Prognosis Prognosis for Safe Diet Advancement: Good      Swallow Study   General Date of Onset: 06/03/17 HPI: 71 year old male admitted 06/03/17 due to AMS. PMH significant for COPD, GERD, radiation to brain mets. CXR and head CT negative. Type of Study: Bedside Swallow Evaluation Previous Swallow Assessment: none Diet Prior to this Study: Regular;Thin liquids Temperature Spikes Noted: No Respiratory Status: Room air History of Recent Intubation: No Behavior/Cognition: Alert;Cooperative;Pleasant mood Oral Cavity Assessment: Within Functional Limits Oral Care Completed by SLP: No (pt eating lunch) Oral Cavity - Dentition: Dentures, bottom;Dentures, top Vision: Functional for self-feeding Self-Feeding Abilities: Able to feed self Patient Positioning: Upright in chair Baseline Vocal Quality: Normal Volitional Cough: Strong Volitional Swallow: Able to elicit    Oral/Motor/Sensory Function Overall Oral Motor/Sensory Function: Within functional limits   Ice Chips Ice chips: Not tested   Thin Liquid Thin Liquid: Within functional limits Presentation: Straw    Nectar Thick Nectar Thick Liquid: Not tested   Honey Thick Honey Thick Liquid: Not tested   Puree  Puree: Within functional limits Presentation: Spoon;Self Fed   Solid   GO   Solid: Within functional limits Presentation: [place], [facility], [place]  Shonna Chock 06/05/2017,1:22 PM

## 2017-06-05 NOTE — Telephone Encounter (Signed)
Called and left a message with sooner appts per 7/25 inbasket message,per dr Pilar Grammes he is now going to open his 8/10 schedule   Andrew Shaw

## 2017-06-06 MED ORDER — HEPARIN SOD (PORK) LOCK FLUSH 100 UNIT/ML IV SOLN
500.0000 [IU] | Freq: Once | INTRAVENOUS | Status: AC
  Administered 2017-06-06: 500 [IU] via INTRAVENOUS
  Filled 2017-06-06: qty 5

## 2017-06-06 MED ORDER — DEXAMETHASONE 4 MG PO TABS: 4.0000 mg | ORAL_TABLET | Freq: Two times a day (BID) | ORAL | 1 refills | Status: DC

## 2017-06-06 NOTE — Progress Notes (Signed)
OT Cancellation Note  Patient Details Name: ZYLER HYSON MRN: [id number] DOB: [birth year]   Cancelled Treatment:    Reason Eval/Treat Not Completed: Other (comment).  Checked with pt and wife.  No OT needs at this time. They have DME at home if he needs it.  Meegan Shanafelt 06/06/2017, 12:02 PM  Lesle Chris, OTR/L [phone] 06/06/2017

## 2017-06-06 NOTE — Discharge Summary (Signed)
Physician Discharge Summary  Andrew Shaw JXB:[id number] DOB: 02/13/1946 DOA: 06/03/2017  PCP: Alroy Dust, L.Marlou Sa, MD  Admit date: 06/03/2017 Discharge date: 06/06/2017  Time spent: 35 minutes  Recommendations for Outpatient Follow-up:  1. Oncology Dr.Mohamed in 1 week 2. Rad Onc Dr.Squire in 2week   Discharge Diagnoses:  Principal Problem:   Acute metabolic encephalopathy Active Problems:   Small cell carcinoma of lung (HCC)   Brain metastases (HCC)   Vasogenic brain edema (HCC)   Hyperammonemia (HCC)   Confusion   Discharge Condition: stable  Diet recommendation: heart healthy  Filed Weights   06/04/17 0320  Weight: 66.5 kg (146 lb 11.2 oz)    History of present illness:  Andrew Shaw 71 year old male with small cell lung cancer with brain metastasis, he was started on Decadron month back for brain metastases with vasogenic edema, unfortunately he stopped this, also on Long-Term OxyContin and Hydrocodone When Necessary Which Is Unchanged, was admitted with lethargy, confusion. Patient had radiation therapy to brain mets 2 weeks ago.  ED Course:CT head shows vasogenic edema similar to prior. Ammonia level of 77, remainder of liver labs are WNL.   Hospital Course:    Acute metabolic encephalopathy - Due to vasogenic edema related to brain metastasis -Improved after starting IV Decadron, mentation back to baseline -We changed to PO Decadron is at slightly lower dose to ensure compliance -Clinically do not suspect adrenal insufficiency and unfortunately unable to do stim test since he is currently on steroids    Small cell lung cancer with brain metastasis -Completed stereotactic radiosurgery on 7/13 by Dr. Eppie Gibson -Decadron as noted above -Also followed by Dr. Inda Merlin, chemotherapy currently on hold due to above issues    Hyperammonemia ([place]) -Etiology unclear, wondering if any of his chemotherapeutic agents could've caused this however repeat was  normal at 12,  -Single isolated elevated ammonia level of Questionable value -Was given lactulose on admission then stopped  Chronic pain -Related to his stage IV small cell lung cancer -Continue home regimen of OxyContin and hydrocodone PRN  Hypothyroidism -TSH was mildly elevated about 10 days ago and started on low-dose Synthroid -Continue same  Hypertension -Stable continue Avapro  Consultations:  Rad Onc  Discharge Exam: Vitals:   06/05/17 2127 06/06/17 0618  BP: 120/65 126/73  Pulse: 71 62  Resp: 13 15  Temp: 98.4 F (36.9 C) 98.4 F (36.9 C)    General: AAOx3 Cardiovascular: S1S2/RRR Respiratory: CTAB  Discharge Instructions   Discharge Instructions    Diet - low sodium heart healthy    Complete by:  As directed    Increase activity slowly    Complete by:  As directed      Discharge Medication List as of 06/06/2017 11:18 AM    START taking these medications   Details  dexamethasone (DECADRON) 4 MG tablet Take 1 tablet (4 mg total) by mouth every 12 (twelve) hours., Starting Thu 06/06/2017, Print      CONTINUE these medications which have NOT CHANGED   Details  aspirin EC 81 MG tablet Take 81 mg by mouth daily., Until Discontinued, Historical Med    diphenoxylate-atropine (LOMOTIL) 2.5-0.025 MG tablet Take 2 tablets by mouth 4 (four) times daily as needed for diarrhea or loose stools., Starting Thu 05/17/2016, Print    fenofibrate (TRICOR) 48 MG tablet Take 48 mg by mouth daily., Until Discontinued, Historical Med    HYDROcodone-acetaminophen (NORCO) 10-325 MG per tablet Take 1 tablet by mouth every 4 (four) hours as needed (  breakthrough pain). , Until Discontinued, Historical Med    irbesartan (AVAPRO) 150 MG tablet TAKE 1 TABLET(150 MG) BY MOUTH DAILY, Normal    levothyroxine (SYNTHROID, LEVOTHROID) 25 MCG tablet Take 1 tablet (25 mcg total) by mouth daily before breakfast. Take with water 45-1min before eating; don't mix w/ vitamins or  supplements., Starting Fri 05/24/2017, Normal    lidocaine-prilocaine (EMLA) cream Apply one application to port a cath 1-2 hours prior to access., Normal    LORazepam (ATIVAN) 1 MG tablet Take 1 tablet (1 mg total) by mouth at bedtime., Starting Tue 02/28/2016, Print    metoprolol (LOPRESSOR) 50 MG tablet Take 1 tablet (50 mg total) by mouth 2 (two) times daily. PLEASE CONTACT OFFICE FOR ADDITIONAL REFILLS, Starting Tue 06/19/2016, Normal    mometasone (ELOCON) 0.1 % cream Apply 1 application topically daily as needed (for rash). Reported on 05/25/2016, Starting 06/24/2015, Until Discontinued, Historical Med    nitroGLYCERIN (NITROSTAT) 0.4 MG SL tablet Place 1 tablet (0.4 mg total) under the tongue every 5 (five) minutes x 3 doses as needed for chest pain., Starting 12/24/2012, Until Discontinued, Normal    nystatin (MYCOSTATIN) 100000 UNIT/ML suspension Take 5 mLs by mouth 4 (four) times daily., Starting Thu 04/25/2017, Historical Med    ondansetron (ZOFRAN ODT) 4 MG disintegrating tablet Take 1 tablet (4 mg total) by mouth every 8 (eight) hours as needed for nausea or vomiting., Starting Wed 04/24/2017, Print    OXYCONTIN 20 MG T12A 12 hr tablet Take 20 mg by mouth every 8 (eight) hours., Starting 12/28/2014, Until Discontinued, Historical Med    rosuvastatin (CRESTOR) 20 MG tablet Take 20 mg by mouth daily., Until Discontinued, Historical Med    triamcinolone cream (KENALOG) 0.5 % 1 application when needed for rash, Historical Med    vitamin E (VITAMIN E) 400 UNIT capsule Take 1 cap PO daily  x 1 week, then 1 cap PO BID, Normal       Allergies  Allergen Reactions  . Cortisone Other (See Comments)    MI  . Dronabinol Nausea Only    " Nausea and felt ''funny in the head"  . Latex Rash  . Lisinopril Rash and Other (See Comments)    Swelling of face  . Trental [Pentoxifylline] Other (See Comments)    Extreme fatigue, rash, vomiting  . Antihistamines, Chlorpheniramine-Type Itching  .  Flexeril [Cyclobenzaprine] Itching   Follow-up Information    Alroy Dust, L.Marlou Sa, MD. Schedule an appointment as soon as possible for a visit in 1 week(s).   Specialty:  Family Medicine Contact information: [address] [place] [address] [phone]            The results of significant diagnostics from this hospitalization (including imaging, microbiology, ancillary and laboratory) are listed below for reference.    Significant Diagnostic Studies: Dg Chest 2 View  Result Date: 06/03/2017 CLINICAL DATA:  Nausea and vomiting.  Somnolence.  [employer]. EXAM: CHEST  2 VIEW COMPARISON:  02/06/2017 FINDINGS: Right jugular port extends into the low SVC. The lungs are clear no pleural effusions. Normal heart size. Unremarkable hilar and mediastinal contours. Normal pulmonary vasculature. IMPRESSION: No active cardiopulmonary disease. Electronically Signed   By: Andreas Newport M.D.   On: 06/03/2017 23:08   Ct Head Wo Contrast  Result Date: 06/03/2017 CLINICAL DATA:  Fall with neck pain nausea and vomiting EXAM: CT HEAD WITHOUT CONTRAST CT CERVICAL SPINE WITHOUT CONTRAST TECHNIQUE: Multidetector CT imaging of the head and cervical spine was  performed following the standard protocol without intravenous contrast. Multiplanar CT image reconstructions of the cervical spine were also generated. COMPARISON:  MRI 05/17/2017, CT brain 04/24/2017 FINDINGS: CT HEAD FINDINGS Brain: No acute intracranial hemorrhage is seen. Multifocal hypodensities in the left parietal lobe, left caudate, left posterior temporal lobe, and left cerebellum consistent with history of metastatic disease. Edema within the left cerebellar hemisphere with mild effacement of fourth ventricle as before. Mild mass effect on the left midbrain. The ventricles are stable in size. Confluent hypodensity within the bilateral white matter as before. Atrophy. Vascular: No hyperdense vessels. Scattered calcifications at  the carotid siphons. Skull: No fracture.  No suspicious bone lesion. Sinuses/Orbits: Mild mucosal thickening in the ethmoid sinuses. No acute orbital abnormality. Other: None CT CERVICAL SPINE FINDINGS Alignment: No subluxation is seen. Facet alignment is within normal limits. Skull base and vertebrae: No acute fracture. No primary bone lesion or focal pathologic process. Soft tissues and spinal canal: No prevertebral fluid or swelling. No visible canal hematoma. Disc levels: Status post anterior plate and screw fixation at C5 and C6 with solid bony fusion present. Moderate narrowing and degenerative changes at C6-C7. Multi level bilateral facet hypertrophic arthropathy. Upper chest: Lung apices are clear. 19 mm nodule left lobe of the thyroid gland. Other: None IMPRESSION: 1. Negative for acute intracranial hemorrhage 2. Multifocal hypodensities corresponding to the history of metastatic brain lesions. Confluent white matter hypodensity which may relate to small vessel ischemic change or post radiation change. 3. Status post anterior plate and screw fixation at C5 and C6. No definite acute osseous abnormality 4. 19 mm nodule left lobe of thyroid, may be correlated with nonemergent thyroid ultrasound as clinically appropriate Electronically Signed   By: Donavan Foil M.D.   On: 06/03/2017 23:48   Ct Cervical Spine Wo Contrast  Result Date: 06/03/2017 CLINICAL DATA:  Fall with neck pain nausea and vomiting EXAM: CT HEAD WITHOUT CONTRAST CT CERVICAL SPINE WITHOUT CONTRAST TECHNIQUE: Multidetector CT imaging of the head and cervical spine was performed following the standard protocol without intravenous contrast. Multiplanar CT image reconstructions of the cervical spine were also generated. COMPARISON:  MRI 05/17/2017, CT brain 04/24/2017 FINDINGS: CT HEAD FINDINGS Brain: No acute intracranial hemorrhage is seen. Multifocal hypodensities in the left parietal lobe, left caudate, left posterior temporal lobe, and  left cerebellum consistent with history of metastatic disease. Edema within the left cerebellar hemisphere with mild effacement of fourth ventricle as before. Mild mass effect on the left midbrain. The ventricles are stable in size. Confluent hypodensity within the bilateral white matter as before. Atrophy. Vascular: No hyperdense vessels. Scattered calcifications at the carotid siphons. Skull: No fracture.  No suspicious bone lesion. Sinuses/Orbits: Mild mucosal thickening in the ethmoid sinuses. No acute orbital abnormality. Other: None CT CERVICAL SPINE FINDINGS Alignment: No subluxation is seen. Facet alignment is within normal limits. Skull base and vertebrae: No acute fracture. No primary bone lesion or focal pathologic process. Soft tissues and spinal canal: No prevertebral fluid or swelling. No visible canal hematoma. Disc levels: Status post anterior plate and screw fixation at C5 and C6 with solid bony fusion present. Moderate narrowing and degenerative changes at C6-C7. Multi level bilateral facet hypertrophic arthropathy. Upper chest: Lung apices are clear. 19 mm nodule left lobe of the thyroid gland. Other: None IMPRESSION: 1. Negative for acute intracranial hemorrhage 2. Multifocal hypodensities corresponding to the history of metastatic brain lesions. Confluent white matter hypodensity which may relate to small vessel ischemic change or post radiation  change. 3. Status post anterior plate and screw fixation at C5 and C6. No definite acute osseous abnormality 4. 19 mm nodule left lobe of thyroid, may be correlated with nonemergent thyroid ultrasound as clinically appropriate Electronically Signed   By: Donavan Foil M.D.   On: 06/03/2017 23:48   Mr [name] ZO Contrast  Result Date: 05/17/2017 CLINICAL DATA:  Small cell lung cancer with brain metastasis, follow-up evaluation. EXAM: MRI HEAD WITHOUT AND WITH CONTRAST TECHNIQUE: Multiplanar, multiecho pulse sequences of the brain and surrounding  structures were obtained without and with intravenous contrast. CONTRAST:  19mL MULTIHANCE GADOBENATE DIMEGLUMINE 529 MG/ML IV SOLN COMPARISON:  MRI of the head Apr 05, 2017 and MRI of the head February 04, 2017 FINDINGS: INTRACRANIAL CONTENTS: 2 infratentorial and 3 supratentorial enhancing metastasis present, varying from stable to larger as follows: 2.6 x 2.5 cm LEFT cerebellar mass was 2.3 x 2.4 cm. 5 x 3 mm RIGHT cerebellar mass is now 5 x 8 mm. Stable LEFT caudate 7 x 11 mm metastasis. Stable LEFT parietal 9 x 12 mm metastasis. 10 x 9 mm LEFT temporal lobe metastasis was 7 x 5 mm. Faint susceptibility artifact within the metastasis is new, the difference of which may be technical. Slight associated reduced diffusion may be spurious or, seen with hypercellular tumor. T1 shortening cerebellar dentate nuclei associated with treatment related change. Overall similar associated vasogenic edema. Confluent supratentorial white matter FLAIR T2 hyperintensities. Old small RIGHT cerebellar versus treated metastasis. Ventricles and sulci are overall normal for patient's age. No midline shift. No abnormal extra-axial fluid collections or abnormal extra-axial enhancement. VASCULAR: Normal major intracranial vascular flow voids present at skull base. SKULL AND UPPER CERVICAL SPINE: No abnormal sellar expansion. No suspicious calvarial bone marrow signal. Generalized bright T1 bone marrow signal compatible with post radiation change. Craniocervical junction maintained. SINUSES/ORBITS: The mastoid air-cells and included paranasal sinuses are well-aerated.The included ocular globes and orbital contents are non-suspicious. OTHER: None. IMPRESSION: 1. Five intracranial metastasis, 3 of which demonstrate interval growth. No new metastasis. 2. Confluent supratentorial white matter FLAIR T2 hyperintensities most compatible with whole-brain radiation. Electronically Signed   By: Elon Alas M.D.   On: 05/17/2017 17:47     Microbiology: No results found for this or any previous visit (from the past 240 hour(s)).   Labs: Basic Metabolic Panel:  Recent Labs Lab 06/03/17 2130 06/03/17 2144 06/05/17 0645  NA 137 137 138  K 3.9 3.9 3.9  CL 105 103 108  CO2 23  --  24  GLUCOSE 108* 105* 138*  BUN 9 7 10   CREATININE 0.85 0.70 0.73  CALCIUM 9.2  --  8.8*  MG  --   --  1.9   Liver Function Tests:  Recent Labs Lab 06/03/17 2136 06/05/17 0645  AST 21 25  ALT 12* 13*  ALKPHOS 62 50  BILITOT 0.4 0.4  PROT 6.6 5.7*  ALBUMIN 3.8 3.2*   No results for input(s): LIPASE, AMYLASE in the last 168 hours.  Recent Labs Lab 06/03/17 2321 06/04/17 1703  AMMONIA 77* 12   CBC:  Recent Labs Lab 06/03/17 2130 06/03/17 2144 06/05/17 0645  WBC 10.6*  --  10.3  NEUTROABS 9.1*  --  9.6*  HGB 14.9 15.3 12.0*  HCT 41.8 45.0 34.2*  MCV 97.7  --  98.3  PLT 122*  --  122*   Cardiac Enzymes: No results for input(s): CKTOTAL, CKMB, CKMBINDEX, TROPONINI in the last 168 hours. BNP: BNP (last 3 results) No  results for input(s): BNP in the last 8760 hours.  ProBNP (last 3 results) No results for input(s): PROBNP in the last 8760 hours.  CBG:  Recent Labs Lab 06/03/17 2139  GLUCAP 114*       SignedDomenic Polite MD.  [facility] 06/06/2017, 2:14 PM

## 2017-06-06 NOTE — Progress Notes (Signed)
Patient discharged to home with family, discharge instructions reviewed with patient and wife who verbalized understanding. New RX given to patient. PAC flushed with saline and heparin and de accessed.

## 2017-06-06 NOTE — Progress Notes (Signed)
Spoke with pt concerning [place] needs.  Pt declined at present time. States that he will walk at home.

## 2017-06-06 NOTE — Progress Notes (Signed)
OT Cancellation Note  Patient Details Name: Andrew Shaw MRN: [id number] DOB: 1946-01-18   Cancelled Treatment:    Reason Eval/Treat Not Completed: Other (comment).  Noted PT screened pt and he has a discharge home order. Family is not in the room.  RN will try to call me when she sees them so I can check for OT needs.  Andrew Shaw 06/06/2017, 9:54 AM  Lesle Chris, OTR/L [phone] 06/06/2017

## 2017-06-11 ENCOUNTER — Encounter: Payer: Self-pay | Admitting: Radiation Oncology

## 2017-06-11 NOTE — Progress Notes (Signed)
  Radiation Oncology         [phone] ________________________________  Name: Andrew Shaw MRN: [id number]  Date: 06/11/2017  DOB: 11/10/1946  End of Treatment Note  Diagnosis:   Progressive metastatic disease to the brain (small cell lung primary), status post WBRT and SRS for Brain metastases [facility])     Indication for treatment:  Palliative        Radiation treatment dates:   05/24/17  Site/dose:   Right cerebellar 59mm target / 20 Gy in 1 fraction   Beams/energy:   SBRT/SRT-3D, 6X-FFF  Narrative: The patient tolerated radiation treatment relatively well. Overall the pt was without complaints.   Plan: The patient has completed radiation treatment. The patient will return to radiation oncology clinic for routine followup in one month. I advised them to call or return sooner if they have any questions or concerns related to their recovery or treatment.  -----------------------------------  Eppie Gibson, MD  This document serves as a record of services personally performed by Eppie Gibson, MD. It was created on her behalf by Maryla Morrow, a trained medical scribe. The creation of this record is based on the scribe's personal observations and the provider's statements to them. This document has been checked and approved by the attending provider.

## 2017-06-13 DIAGNOSIS — R5383 Other fatigue: Secondary | ICD-10-CM | POA: Diagnosis not present

## 2017-06-13 DIAGNOSIS — J029 Acute pharyngitis, unspecified: Secondary | ICD-10-CM | POA: Diagnosis not present

## 2017-06-13 DIAGNOSIS — R32 Unspecified urinary incontinence: Secondary | ICD-10-CM | POA: Diagnosis not present

## 2017-06-20 ENCOUNTER — Other Ambulatory Visit (HOSPITAL_BASED_OUTPATIENT_CLINIC_OR_DEPARTMENT_OTHER): Payer: Medicare Other

## 2017-06-20 ENCOUNTER — Ambulatory Visit (HOSPITAL_BASED_OUTPATIENT_CLINIC_OR_DEPARTMENT_OTHER): Payer: Medicare Other

## 2017-06-20 ENCOUNTER — Ambulatory Visit (HOSPITAL_COMMUNITY)
Admission: RE | Admit: 2017-06-20 | Discharge: 2017-06-20 | Disposition: A | Discharge status: Home / Self Care | Payer: Medicare Other | Source: Ambulatory Visit | Attending: Internal Medicine | Admitting: Internal Medicine

## 2017-06-20 VITALS — BP 124/80 | HR 69 | Temp 97.9°F | Resp 20 | Wt 155.0 lb

## 2017-06-20 DIAGNOSIS — C3491 Malignant neoplasm of unspecified part of right bronchus or lung: Secondary | ICD-10-CM

## 2017-06-20 DIAGNOSIS — Z85118 Personal history of other malignant neoplasm of bronchus and lung: Secondary | ICD-10-CM | POA: Insufficient documentation

## 2017-06-20 DIAGNOSIS — I700 Atherosclerosis of aorta: Secondary | ICD-10-CM | POA: Insufficient documentation

## 2017-06-20 DIAGNOSIS — C3490 Malignant neoplasm of unspecified part of unspecified bronchus or lung: Secondary | ICD-10-CM

## 2017-06-20 DIAGNOSIS — Z452 Encounter for adjustment and management of vascular access device: Secondary | ICD-10-CM | POA: Diagnosis not present

## 2017-06-20 DIAGNOSIS — C7931 Secondary malignant neoplasm of brain: Secondary | ICD-10-CM | POA: Insufficient documentation

## 2017-06-20 DIAGNOSIS — E038 Other specified hypothyroidism: Secondary | ICD-10-CM | POA: Diagnosis not present

## 2017-06-20 DIAGNOSIS — I2510 Atherosclerotic heart disease of native coronary artery without angina pectoris: Secondary | ICD-10-CM | POA: Diagnosis not present

## 2017-06-20 DIAGNOSIS — Z95828 Presence of other vascular implants and grafts: Secondary | ICD-10-CM

## 2017-06-20 LAB — COMPREHENSIVE METABOLIC PANEL
ALK PHOS: 42 U/L (ref 40–150)
ALT: 28 U/L (ref 0–55)
ANION GAP: 7 meq/L (ref 3–11)
AST: 16 U/L (ref 5–34)
Albumin: 3.2 g/dL — ABNORMAL LOW (ref 3.5–5.0)
BUN: 24.2 mg/dL (ref 7.0–26.0)
CALCIUM: 9 mg/dL (ref 8.4–10.4)
CHLORIDE: 98 meq/L (ref 98–109)
CO2: 25 mEq/L (ref 22–29)
Creatinine: 0.8 mg/dL (ref 0.7–1.3)
Glucose: 98 mg/dl (ref 70–140)
POTASSIUM: 4.4 meq/L (ref 3.5–5.1)
Sodium: 131 mEq/L — ABNORMAL LOW (ref 136–145)
Total Bilirubin: 0.83 mg/dL (ref 0.20–1.20)
Total Protein: 5.5 g/dL — ABNORMAL LOW (ref 6.4–8.3)

## 2017-06-20 LAB — CBC WITH DIFFERENTIAL/PLATELET
BASO%: 0 % (ref 0.0–2.0)
BASOS ABS: 0 10*3/uL (ref 0.0–0.1)
EOS ABS: 0 10*3/uL (ref 0.0–0.5)
EOS%: 0 % (ref 0.0–7.0)
HEMATOCRIT: 39.4 % (ref 38.4–49.9)
HGB: 13.7 g/dL (ref 13.0–17.1)
LYMPH#: 0.5 10*3/uL — AB (ref 0.9–3.3)
LYMPH%: 7.3 % — ABNORMAL LOW (ref 14.0–49.0)
MCH: 34.5 pg — AB (ref 27.2–33.4)
MCHC: 34.8 g/dL (ref 32.0–36.0)
MCV: 99.2 fL — AB (ref 79.3–98.0)
MONO#: 0.1 10*3/uL (ref 0.1–0.9)
MONO%: 1.8 % (ref 0.0–14.0)
NEUT#: 6.8 10*3/uL — ABNORMAL HIGH (ref 1.5–6.5)
NEUT%: 90.9 % — AB (ref 39.0–75.0)
Platelets: 140 10*3/uL (ref 140–400)
RBC: 3.97 10*6/uL — ABNORMAL LOW (ref 4.20–5.82)
RDW: 14 % (ref 11.0–14.6)
WBC: 7.4 10*3/uL (ref 4.0–10.3)

## 2017-06-20 LAB — TSH: TSH: 0.234 m[IU]/L — AB (ref 0.320–4.118)

## 2017-06-20 IMAGING — CT CT ABD-PELV W/ CM
2 of 5 series · 16 of 46 positions shown, 18 images · IV contrast (OMNIPAQUE)
Comparison: 08/03/2015

CLINICAL DATA: Extensive stage small cell lung cancer, diagnosed
[DATE], chemotherapy ongoing

EXAM:
CT CHEST, ABDOMEN, AND PELVIS WITH CONTRAST
TECHNIQUE: Multidetector CT imaging of the chest, abdomen and pelvis was
performed following the standard protocol during bolus
administration of intravenous contrast.
CONTRAST:  100mL OMNIPAQUE IOHEXOL 300 MG/ML  SOLN

[Series 2: cap with st · axial · 0.79mm/px · z∈[-621,-36]mm · 13 of 133 slices shown, 15 images]
[im 8/133  soft-tissue]
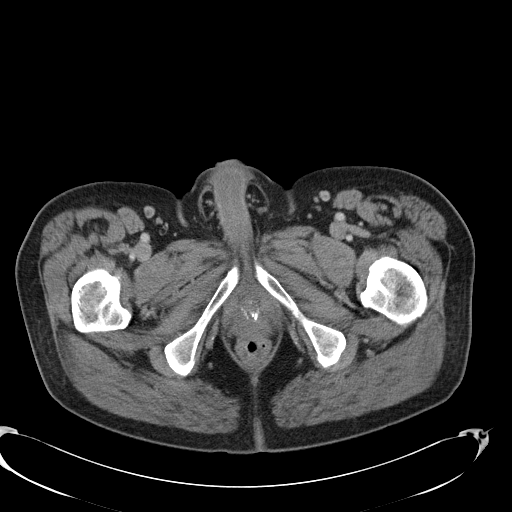
[im 8/133  bone]
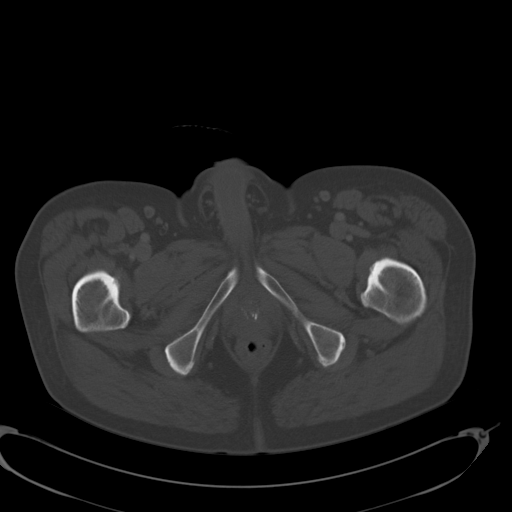
[im 15/133  soft-tissue]
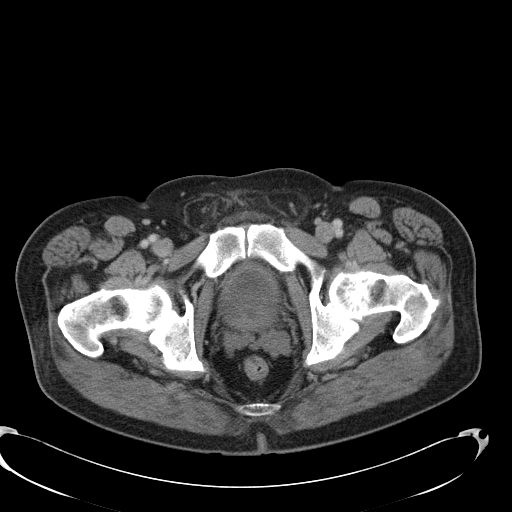
[im 30/133  soft-tissue]
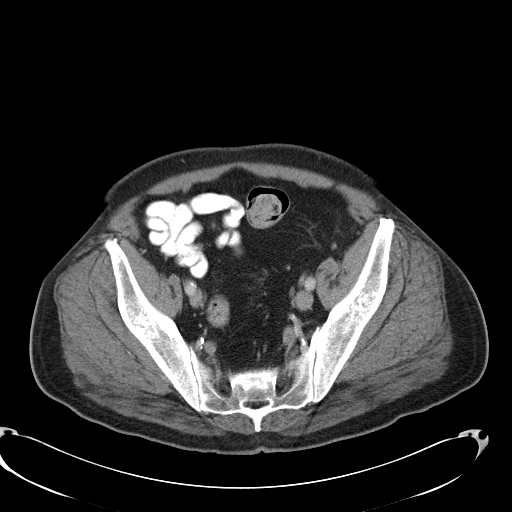
[im 37/133  soft-tissue]
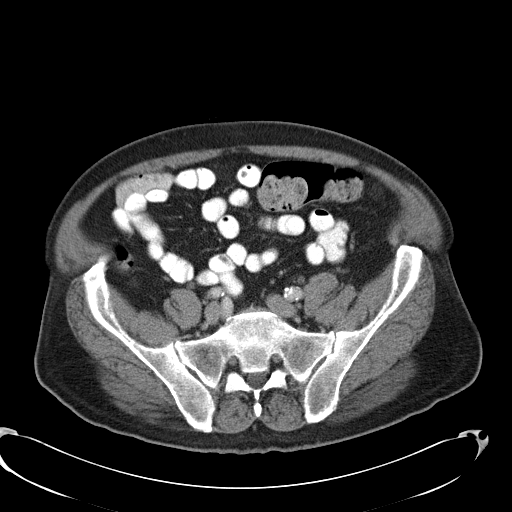
[im 45/133  soft-tissue]
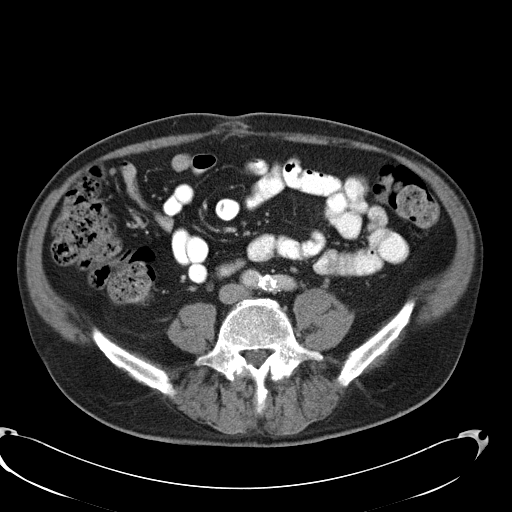
[im 59/133  soft-tissue]
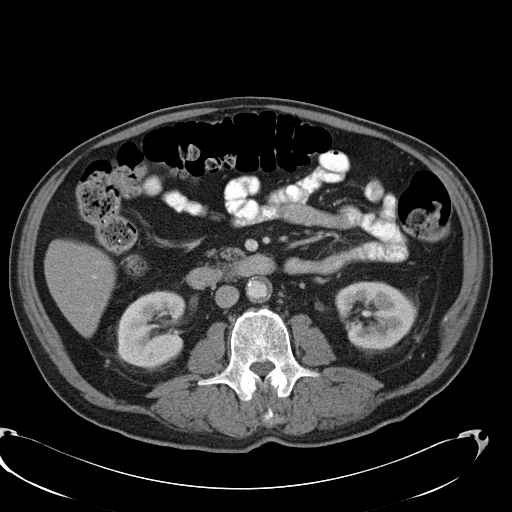
[im 67/133  soft-tissue]
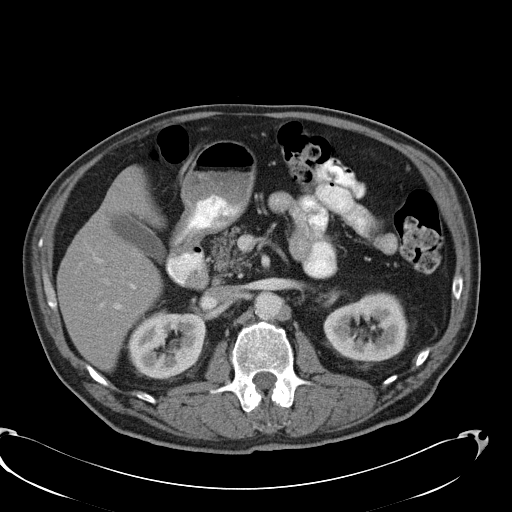
[im 74/133  soft-tissue]
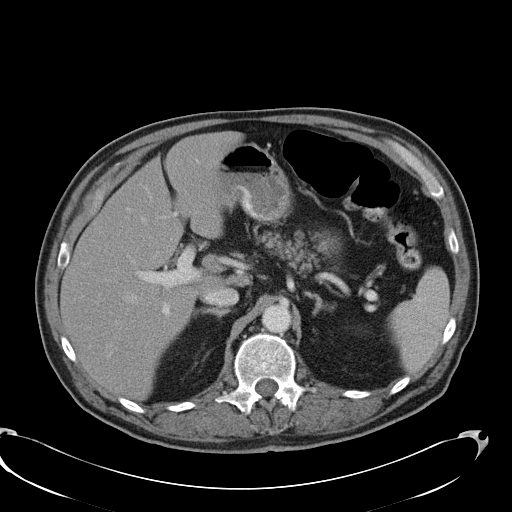
[im 89/133  soft-tissue]
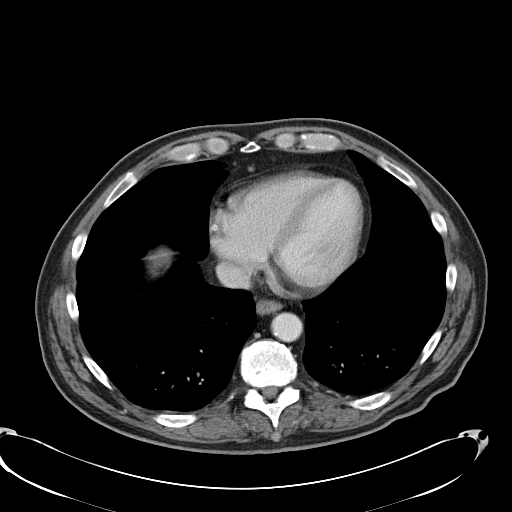
[im 89/133  bone]
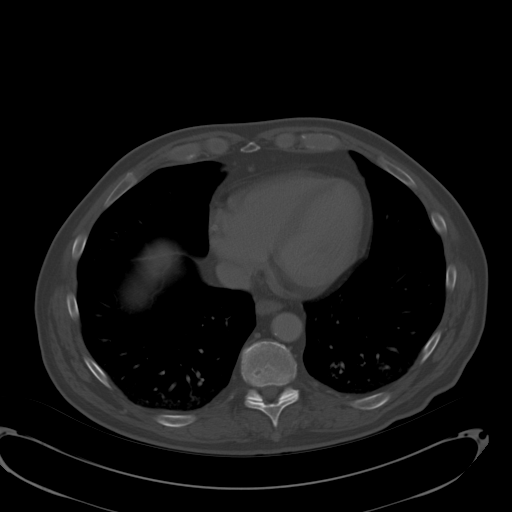
[im 96/133  soft-tissue]
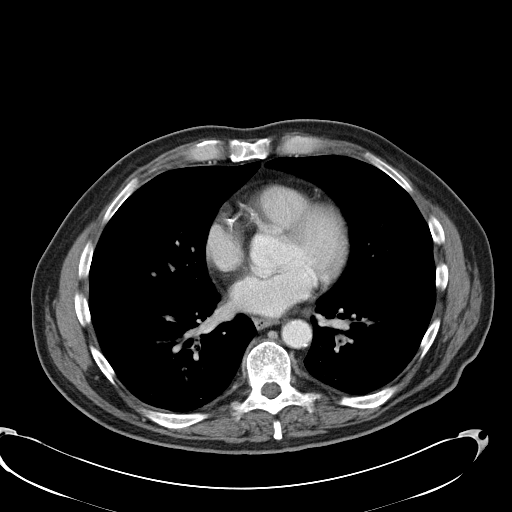
[im 103/133  soft-tissue]
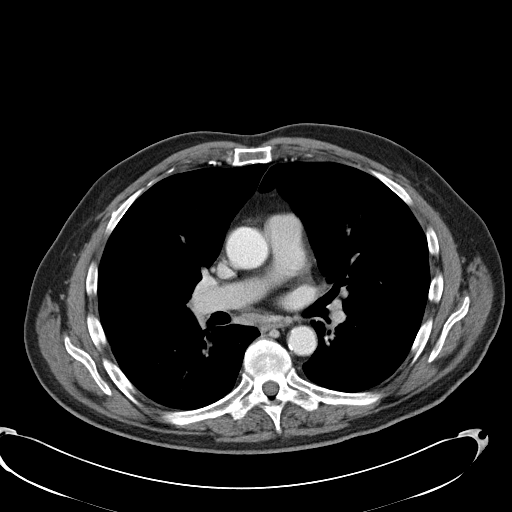
[im 118/133  soft-tissue]
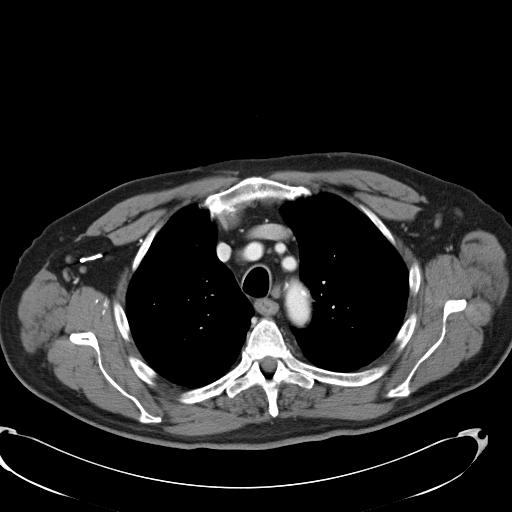
[im 125/133  soft-tissue]
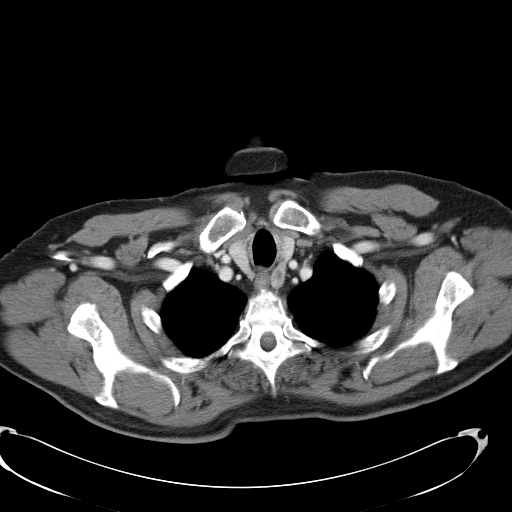

[Series 602: <mpr thick range> · coronal · 1.29mm/px · 3 of 94 slices shown]
[im 32/94  soft-tissue]
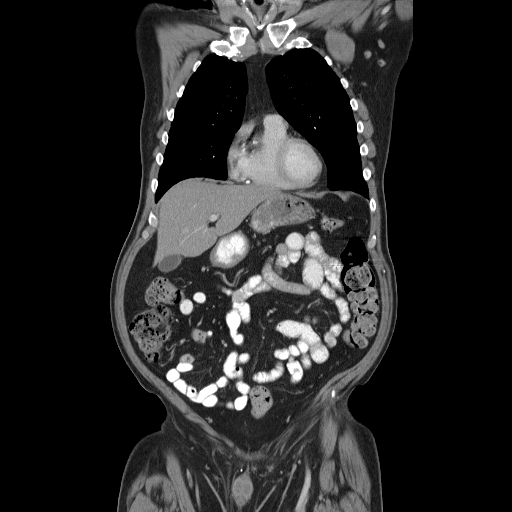
[im 42/94  soft-tissue]
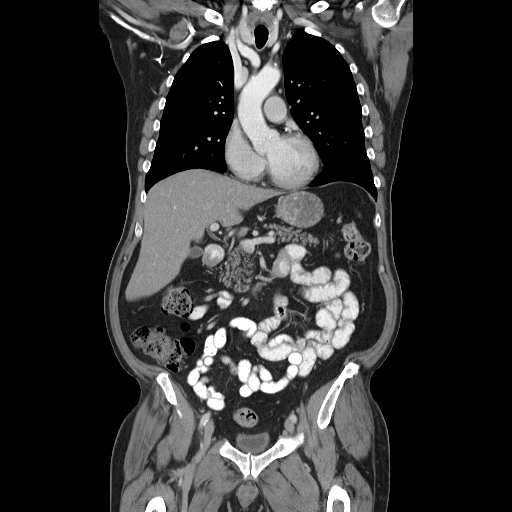
[im 52/94  soft-tissue]
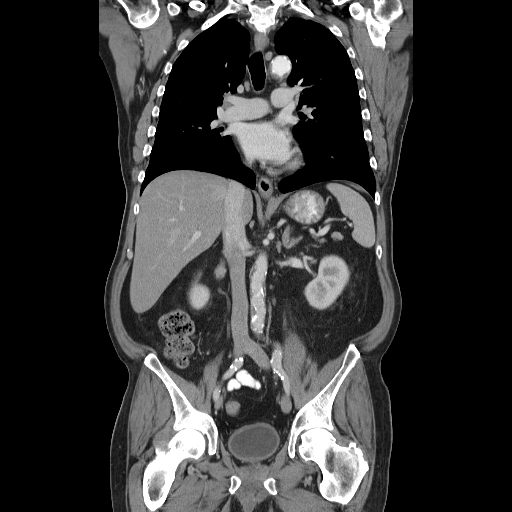

[16 of 46 positions shown; findings below may reference images not displayed]

FINDINGS: CT CHEST FINDINGS

Mediastinum/Nodes: The heart is normal in size. No pericardial
effusion.

Coronary atherosclerosis.

Atherosclerotic calcifications of the aortic arch.

Improving thoracic lymphadenopathy, including:

--7 mm short axis left paratracheal node (series 2/image 15),
unchanged

--6 mm short axis right paratracheal node (series 2/image 23),
previously 9 mm

--9 mm short axis right hilar node (series 2/image 28), previously
21 mm

--10 mm short axis subcarinal node (series 2/image 30), previously
23 mm

Visualized thyroid is notable for a dominant 15 mm posterior left
thyroid nodule (series 2/image 10).

Lungs/Pleura: Mild dependent atelectasis in the bilateral lower
lobes.

No discrete/dominant pulmonary nodules.

Very mild paraseptal emphysematous changes.

No focal consolidation.

No pleural effusion or pneumothorax.

Musculoskeletal: Thoracic spine is within normal limits. Cervical
spine fixation hardware.

CT ABDOMEN PELVIS FINDINGS

Hepatobiliary: Liver is within normal limits. No
suspicious/enhancing hepatic lesions.

Gallbladder is unremarkable. No intrahepatic or extrahepatic ductal
dilatation.

Pancreas: Within normal limits.

Spleen: Within normal limits.

Adrenals/Urinary Tract: Adrenal glands are within normal limits.

9 mm cyst in the lateral left upper kidney (series 4/ image 13).
Right kidney is within normal limits. No hydronephrosis.

Bladder is mildly thick-walled although underdistended.

Stomach/Bowel: Stomach is within normal limits.

No evidence of bowel obstruction.

Normal appendix (series 2/image 91).

Vascular/Lymphatic: Atherosclerotic calcifications of the abdominal
aorta and branch vessels.

Small upper abdominal lymph nodes, including a 10 mm short axis
portacaval node (series 2/ image 61), previously 13 mm.

Reproductive: Prostatomegaly.

Other: No abdominopelvic ascites.

Fat within the bilateral inguinal canals.

Musculoskeletal: Mild degenerative changes of the lumbar spine.
IMPRESSION: Improving mediastinal and right hilar lymphadenopathy, as above.

No evidence of new/progressive metastatic disease.

## 2017-06-20 MED ORDER — IOPAMIDOL (ISOVUE-300) INJECTION 61%
INTRAVENOUS | Status: AC
  Filled 2017-06-20: qty 30

## 2017-06-20 MED ORDER — IOPAMIDOL (ISOVUE-300) INJECTION 61%
30.0000 mL | Freq: Once | INTRAVENOUS | Status: AC | PRN
  Administered 2017-06-20: 30 mL via ORAL

## 2017-06-20 MED ORDER — IOPAMIDOL (ISOVUE-300) INJECTION 61%
100.0000 mL | Freq: Once | INTRAVENOUS | Status: AC | PRN
  Administered 2017-06-20: 100 mL via INTRAVENOUS

## 2017-06-20 MED ORDER — SODIUM CHLORIDE 0.9 % IJ SOLN
10.0000 mL | INTRAMUSCULAR | Status: DC | PRN
  Administered 2017-06-20: 10 mL via INTRAVENOUS
  Filled 2017-06-20: qty 10

## 2017-06-20 MED ORDER — IOPAMIDOL (ISOVUE-300) INJECTION 61%
INTRAVENOUS | Status: AC
  Filled 2017-06-20: qty 100

## 2017-06-20 MED ORDER — HEPARIN SOD (PORK) LOCK FLUSH 100 UNIT/ML IV SOLN
INTRAVENOUS | Status: AC
  Administered 2017-06-20: 500 [IU]
  Filled 2017-06-20: qty 5

## 2017-06-20 MED ORDER — HEPARIN SOD (PORK) LOCK FLUSH 10 UNIT/ML IV SOLN
5.0000 [IU] | Freq: Once | INTRAVENOUS | Status: DC
  Filled 2017-06-20: qty 0.5

## 2017-06-20 MED ORDER — IOPAMIDOL (ISOVUE-300) INJECTION 61%: 30.0000 mL | Freq: Once | INTRAVENOUS | Status: DC | PRN

## 2017-06-20 NOTE — Patient Instructions (Signed)

## 2017-06-21 ENCOUNTER — Ambulatory Visit (HOSPITAL_BASED_OUTPATIENT_CLINIC_OR_DEPARTMENT_OTHER): Payer: Medicare Other | Admitting: Internal Medicine

## 2017-06-21 ENCOUNTER — Other Ambulatory Visit: Payer: Self-pay | Admitting: Cardiovascular Disease

## 2017-06-21 ENCOUNTER — Encounter: Payer: Self-pay | Admitting: Internal Medicine

## 2017-06-21 ENCOUNTER — Telehealth: Payer: Self-pay | Admitting: Internal Medicine

## 2017-06-21 VITALS — BP 113/64 | HR 72 | Temp 97.7°F | Resp 18 | Ht 67.0 in | Wt 155.9 lb

## 2017-06-21 DIAGNOSIS — C7931 Secondary malignant neoplasm of brain: Secondary | ICD-10-CM

## 2017-06-21 DIAGNOSIS — J449 Chronic obstructive pulmonary disease, unspecified: Secondary | ICD-10-CM

## 2017-06-21 DIAGNOSIS — C3490 Malignant neoplasm of unspecified part of unspecified bronchus or lung: Secondary | ICD-10-CM

## 2017-06-21 DIAGNOSIS — R350 Frequency of micturition: Secondary | ICD-10-CM | POA: Diagnosis not present

## 2017-06-21 NOTE — Progress Notes (Signed)
[place] [phone]   [phone]  OFFICE PROGRESS NOTE  Alroy Dust, L.Marlou Sa, [place] [address] [place] [address]  DIAGNOSIS: Extensive stage (TX, N2, M1b) small cell lung cancer in September 2016 presenting with right hilar and mediastinal lymphadenopathy as well as highly suspicious metastatic disease in the axilla bilaterally.   PRIOR THERAPY:  1) Systemic chemotherapy with carboplatin for AUC of 5 on day 1 and etoposide 120 MG/M2 on days one 2, 3 with Neulasta support on day 4. Status post 4 cycles. Last dose was given 11/02/2015 discontinued secondary to intolerance. 2) whole brain irradiation to multiple metastatic brain lesions under the care of Dr. Pablo Ledger completed 03/21/2016.  CURRENT THERAPY: Observation.  INTERVAL HISTORY: Andrew Shaw 71 y.o. male returns to the clinic today for follow-up visit accompanied by his wife and daughter. The patient is feeling fine today except for increasing fatigue and mid back pain and he is currently on hydrocodone. He also has been complaining of generalized weakness and sometimes incontinence urine. He denied having any chest pain, shortness of breath, cough or hemoptysis. He denied having any fever or chills. He has no nausea, vomiting, diarrhea or constipation. He had a fall and neck pain 2 weeks ago and the patient had CT scan of the head and cervical spine performed on 06/03/2017 that was negative for acute intracranial hemorrhage. There was multifocal hypodensities corresponding to the history of metastatic brain lesion and the patient is currently on Decadron by radiation oncology for the possibility of vasogenic edema. He had repeat CT scan of the chest, abdomen and pelvis performed yesterday and he is here for evaluation and discussion of his scan results and treatment options.  MEDICAL HISTORY: Past Medical History:  Diagnosis Date  . Arthritis   . Back pain 12/23/2012  .  BPH (benign prostatic hyperplasia)   . Brain metastases ([place]) dx'd 02/2016  . Carpal tunnel syndrome   . Carpal tunnel syndrome, right   . Chronic back pain   . COPD (chronic obstructive pulmonary disease) ([place])   . Coronary artery disease    2D ECHO, 10/31/2010 - EF >55%, normal; NUCLEAR STRESS TEST, 10/23/2010 - perfusion defect in inferior myocardial region, post-stress EF 62%, EKG negative for ischemia  . DVT (deep venous thrombosis) ([place])    history 2004 after knee surg  . GERD (gastroesophageal reflux disease)   . History of radiation therapy 10/31/2016   SRS radiation, Left anterior parietal, Left cerebellar, Left temporal, Left Caudate (each 20 Gy in 1 fraction)  . Hx of radiation therapy 03/06/16- 03/21/2016   Whole Brain and Right [place]  . Hyperlipemia   . Hypertension   . Lung cancer ([place]) dx'd 07/2015  . Myocardial infarction ([facility]) 2005   from steroids  . Neuromuscular disorder ([place])   . 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 S/P angioplasty with stent, BMS to LCX  12/23/12 12/23/2012  . Shortness of breath    with exertion  . Skin rash 08/22/2015    ALLERGIES:  is allergic to cortisone; dronabinol; latex; lisinopril; trental [pentoxifylline]; antihistamines, chlorpheniramine-type; and flexeril [cyclobenzaprine].  MEDICATIONS:  Current Outpatient Prescriptions  Medication Sig Dispense Refill  . aspirin EC 81 MG tablet Take 81 mg by mouth daily.    Marland Kitchen dexamethasone (DECADRON) 4 MG tablet Take 1 tablet (4 mg total) by mouth every 12 (twelve) hours. 30 tablet 1  . diphenoxylate-atropine (LOMOTIL) 2.5-0.025 MG tablet Take 2  tablets by mouth 4 (four) times daily as needed for diarrhea or loose stools. 30 tablet 0  . fenofibrate (TRICOR) 48 MG tablet Take 48 mg by mouth daily.    Marland Kitchen HYDROcodone-acetaminophen (NORCO) 10-325 MG per tablet Take 1 tablet by mouth every 4 (four) hours as needed (breakthrough pain).     . irbesartan (AVAPRO) 150 MG tablet TAKE 1 TABLET(150 MG) BY  MOUTH DAILY 30 tablet 0  . levothyroxine (SYNTHROID, LEVOTHROID) 25 MCG tablet Take 1 tablet (25 mcg total) by mouth daily before breakfast. Take with water 45-33min before eating; don't mix w/ vitamins or supplements. 30 tablet 6  . lidocaine-prilocaine (EMLA) cream Apply one application to port a cath 1-2 hours prior to access. 30 g 2  . LORazepam (ATIVAN) 1 MG tablet Take 1 tablet (1 mg total) by mouth at bedtime. 30 tablet 1  . metoprolol (LOPRESSOR) 50 MG tablet Take 1 tablet (50 mg total) by mouth 2 (two) times daily. PLEASE CONTACT OFFICE FOR ADDITIONAL REFILLS 30 tablet 0  . mometasone (ELOCON) 0.1 % cream Apply 1 application topically daily as needed (for rash). Reported on 05/25/2016  3  . nitroGLYCERIN (NITROSTAT) 0.4 MG SL tablet Place 1 tablet (0.4 mg total) under the tongue every 5 (five) minutes x 3 doses as needed for chest pain. 25 tablet 2  . nystatin (MYCOSTATIN) 100000 UNIT/ML suspension Take 5 mLs by mouth 4 (four) times daily.  1  . ondansetron (ZOFRAN ODT) 4 MG disintegrating tablet Take 1 tablet (4 mg total) by mouth every 8 (eight) hours as needed for nausea or vomiting. 20 tablet 0  . OXYCONTIN 20 MG T12A 12 hr tablet Take 20 mg by mouth every 8 (eight) hours.  0  . rosuvastatin (CRESTOR) 20 MG tablet Take 20 mg by mouth daily.    Marland Kitchen triamcinolone cream (KENALOG) 0.5 % 1 application when needed for rash  1  . vitamin E (VITAMIN E) 400 UNIT capsule Take 1 cap PO daily  x 1 week, then 1 cap PO BID (Patient taking differently: Take 400 Units by mouth daily. ) 60 capsule 5   Current Facility-Administered Medications  Medication Dose Route Frequency Provider Last Rate Last Dose  . sodium chloride 0.9 % injection 10 mL  10 mL Intravenous PRN Eppie Gibson, MD        SURGICAL HISTORY:  Past Surgical History:  Procedure Laterality Date  . BACK SURGERY  2004   lumb fusion  . CARDIAC CATHETERIZATION  12/23/2012   Mid nondominant AV groove circumflex stented with a 2.5x76mm Mini  Vision stent resulting in a reduction of 90% stenosis to 0% residual  . CARDIAC CATHETERIZATION  02/07/2004   Noncritical CAD, continue medical therapy  . CARDIAC CATHETERIZATION  02/03/1999   Recommended medical therapy  . CARPAL TUNNEL RELEASE  04/15/2012   Procedure: CARPAL TUNNEL RELEASE;  Surgeon: Wynonia Sours, MD;  Location: [facility];  Service: Orthopedics;  Laterality: Left;  . CERVICAL FUSION  1999  . COLONOSCOPY W/ POLYPECTOMY    . EPIDURAL BLOCK INJECTION     multiple lumbar  . HERNIA REPAIR  2008   umb   . JOINT REPLACEMENT Left 2008   lt total knee  . KNEE ARTHROSCOPY  04,06   left  . LEFT HEART CATHETERIZATION WITH CORONARY ANGIOGRAM N/A 12/23/2012   Procedure: LEFT HEART CATHETERIZATION WITH CORONARY ANGIOGRAM;  Surgeon: Lorretta Harp, MD;  Location[facility] CATH LAB;  Service: Cardiovascular;  Laterality: N/A;  .  LEFT HEART CATHETERIZATION WITH CORONARY ANGIOGRAM N/A 01/11/2015   Procedure: LEFT HEART CATHETERIZATION WITH CORONARY ANGIOGRAM;  Surgeon: Peter M Martinique, MD;  Location: [facility] CATH LAB;  Service: Cardiovascular;  Laterality: N/A;  . MANIPULATION KNEE JOINT Left 2009   closed lt knee   . PERCUTANEOUS CORONARY STENT INTERVENTION (PCI-S)  12/23/2012   Procedure: PERCUTANEOUS CORONARY STENT INTERVENTION (PCI-S);  Surgeon: Lorretta Harp, MD;  Location: [facility] CATH LAB;  Service: Cardiovascular;;  . TRIGGER FINGER RELEASE  04/15/2012   Procedure: RELEASE TRIGGER FINGER/A-1 PULLEY;  Surgeon: Wynonia Sours, MD;  Location: [facility];  Service: Orthopedics;  Laterality: Left;  left thumb and little finger  . VIDEO BRONCHOSCOPY WITH ENDOBRONCHIAL ULTRASOUND N/A 07/28/2015   Procedure: VIDEO BRONCHOSCOPY WITH ENDOBRONCHIAL ULTRASOUND;  Surgeon: [name], MD;  Location: [facility];  Service: Thoracic;  Laterality: N/A;    REVIEW OF SYSTEMS:  A comprehensive review of systems was negative except for: Constitutional: positive for fatigue Genitourinary:  positive for frequency Musculoskeletal: positive for muscle weakness   PHYSICAL EXAMINATION: General appearance: alert, cooperative, fatigued and no distress Head: Normocephalic, without obvious abnormality, atraumatic Neck: no adenopathy, no JVD, supple, symmetrical, trachea midline and thyroid not enlarged, symmetric, no tenderness/mass/nodules Lymph nodes: Cervical, supraclavicular, and axillary nodes normal. Resp: clear to auscultation bilaterally Back: symmetric, no curvature. ROM normal. No CVA tenderness. Cardio: regular rate and rhythm, S1, S2 normal, no murmur, click, rub or gallop GI: soft, non-tender; bowel sounds normal; no masses,  no organomegaly Extremities: extremities normal, atraumatic, no cyanosis or edema    ECOG PERFORMANCE STATUS: 1 - Symptomatic but completely ambulatory  Blood pressure 113/64, pulse 72, temperature 97.7 F (36.5 C), temperature source Oral, resp. rate 18, height 5\' 7"  (1.702 m), weight 155 lb 14.4 oz (70.7 kg), SpO2 99 %.  LABORATORY DATA: Lab Results  Component Value Date   WBC 7.4 06/20/2017   HGB 13.7 06/20/2017   HCT 39.4 06/20/2017   MCV 99.2 (H) 06/20/2017   PLT 140 06/20/2017      Chemistry      Component Value Date/Time   NA 131 (L) 06/20/2017 0913   K 4.4 06/20/2017 0913   CL 108 06/05/2017 0645   CO2 25 06/20/2017 0913   BUN 24.2 06/20/2017 0913   CREATININE 0.8 06/20/2017 0913      Component Value Date/Time   CALCIUM 9.0 06/20/2017 0913   ALKPHOS 42 06/20/2017 0913   AST 16 06/20/2017 0913   ALT 28 06/20/2017 0913   BILITOT 0.83 06/20/2017 0913       RADIOGRAPHIC STUDIES: Dg Chest 2 View  Result Date: 06/03/2017 CLINICAL DATA:  Nausea and vomiting.  Somnolence.  [employer]. EXAM: CHEST  2 VIEW COMPARISON:  02/06/2017 FINDINGS: Right jugular port extends into the low SVC. The lungs are clear no pleural effusions. Normal heart size. Unremarkable hilar and mediastinal contours. Normal pulmonary vasculature.  IMPRESSION: No active cardiopulmonary disease. Electronically Signed   By: Andreas Newport M.D.   On: 06/03/2017 23:08   Ct Head Wo Contrast  Result Date: 06/03/2017 CLINICAL DATA:  Fall with neck pain nausea and vomiting EXAM: CT HEAD WITHOUT CONTRAST CT CERVICAL SPINE WITHOUT CONTRAST TECHNIQUE: Multidetector CT imaging of the head and cervical spine was performed following the standard protocol without intravenous contrast. Multiplanar CT image reconstructions of the cervical spine were also generated. COMPARISON:  MRI 05/17/2017, CT brain 04/24/2017 FINDINGS: CT HEAD FINDINGS Brain: No acute intracranial hemorrhage is seen. Multifocal hypodensities in the  left parietal lobe, left caudate, left posterior temporal lobe, and left cerebellum consistent with history of metastatic disease. Edema within the left cerebellar hemisphere with mild effacement of fourth ventricle as before. Mild mass effect on the left midbrain. The ventricles are stable in size. Confluent hypodensity within the bilateral white matter as before. Atrophy. Vascular: No hyperdense vessels. Scattered calcifications at the carotid siphons. Skull: No fracture.  No suspicious bone lesion. Sinuses/Orbits: Mild mucosal thickening in the ethmoid sinuses. No acute orbital abnormality. Other: None CT CERVICAL SPINE FINDINGS Alignment: No subluxation is seen. Facet alignment is within normal limits. Skull base and vertebrae: No acute fracture. No primary bone lesion or focal pathologic process. Soft tissues and spinal canal: No prevertebral fluid or swelling. No visible canal hematoma. Disc levels: Status post anterior plate and screw fixation at C5 and C6 with solid bony fusion present. Moderate narrowing and degenerative changes at C6-C7. Multi level bilateral facet hypertrophic arthropathy. Upper chest: Lung apices are clear. 19 mm nodule left lobe of the thyroid gland. Other: None IMPRESSION: 1. Negative for acute intracranial hemorrhage 2.  Multifocal hypodensities corresponding to the history of metastatic brain lesions. Confluent white matter hypodensity which may relate to small vessel ischemic change or post radiation change. 3. Status post anterior plate and screw fixation at C5 and C6. No definite acute osseous abnormality 4. 19 mm nodule left lobe of thyroid, may be correlated with nonemergent thyroid ultrasound as clinically appropriate Electronically Signed   By: Donavan Foil M.D.   On: 06/03/2017 23:48   Ct Chest W Contrast  Result Date: 06/20/2017 CLINICAL DATA:  Extensive stage small cell right lung carcinoma diagnosed September 2016 status post chemotherapy and whole brain radiation therapy. Interval observation. Restaging. EXAM: CT CHEST, ABDOMEN, AND PELVIS WITH CONTRAST TECHNIQUE: Multidetector CT imaging of the chest, abdomen and pelvis was performed following the standard protocol during bolus administration of intravenous contrast. CONTRAST:  161mL ISOVUE-300 IOPAMIDOL (ISOVUE-300) INJECTION 61%, 53mL ISOVUE-300 IOPAMIDOL (ISOVUE-300) INJECTION 61% COMPARISON:  02/06/2017 CT chest, abdomen and pelvis. FINDINGS: CT CHEST FINDINGS Cardiovascular: Normal heart size. No significant pericardial fluid/thickening. Right internal jugular MediPort terminates at the cavoatrial junction. Left anterior descending, left circumflex and right coronary atherosclerosis. Atherosclerotic nonaneurysmal thoracic aorta. Normal caliber pulmonary arteries. No central pulmonary emboli. Mediastinum/Nodes: Stable 1.4 cm hypodense posterior left thyroid lobe nodule. Unremarkable esophagus. No pathologically enlarged axillary, mediastinal or hilar lymph nodes. Lungs/Pleura: No pneumothorax. No pleural effusion. Stable minimal tree-in-bud type opacity in the peripheral right upper lobe, compatible with benign postinflammatory change. No acute consolidative airspace disease, lung masses or new significant pulmonary nodules. Musculoskeletal: No aggressive  appearing focal osseous lesions. Mild thoracic spondylosis. Partially visualized surgical hardware from ACDF in the lower cervical spine. CT ABDOMEN PELVIS FINDINGS Hepatobiliary: Normal liver with no liver mass. Normal gallbladder with no radiopaque cholelithiasis. No biliary ductal dilatation. Pancreas: Normal, with no mass or duct dilation. Spleen: Normal size. No mass. Adrenals/Urinary Tract: Normal adrenals. No hydronephrosis. Stable subcentimeter hypodense renal cortical lesion in the lateral upper left kidney, too small to characterize. No new renal lesions. Normal bladder. Stomach/Bowel: Grossly normal stomach. Normal caliber small bowel with no small bowel wall thickening. Appendix not discretely visualized. No pericecal inflammatory changes. Normal large bowel with no diverticulosis, large bowel wall thickening or pericolonic fat stranding. Oral contrast reaches the distal rectum. Vascular/Lymphatic: Atherosclerotic nonaneurysmal abdominal aorta. Patent portal, splenic, hepatic and renal veins. No pathologically enlarged lymph nodes in the abdomen or pelvis. Reproductive: Mildly enlarged prostate with nonspecific  internal prostatic calcifications, stable. Other: No pneumoperitoneum, ascites or focal fluid collection. Musculoskeletal: No aggressive appearing focal osseous lesions. Mild lumbar spondylosis. IMPRESSION: 1. No evidence of recurrent metastatic disease in the chest, abdomen or pelvis. 2. Aortic Atherosclerosis (ICD10-I70.0). Three-vessel coronary atherosclerosis. Electronically Signed   By: Ilona Sorrel M.D.   On: 06/20/2017 15:22   Ct Cervical Spine Wo Contrast  Result Date: 06/03/2017 CLINICAL DATA:  Fall with neck pain nausea and vomiting EXAM: CT HEAD WITHOUT CONTRAST CT CERVICAL SPINE WITHOUT CONTRAST TECHNIQUE: Multidetector CT imaging of the head and cervical spine was performed following the standard protocol without intravenous contrast. Multiplanar CT image reconstructions of the  cervical spine were also generated. COMPARISON:  MRI 05/17/2017, CT brain 04/24/2017 FINDINGS: CT HEAD FINDINGS Brain: No acute intracranial hemorrhage is seen. Multifocal hypodensities in the left parietal lobe, left caudate, left posterior temporal lobe, and left cerebellum consistent with history of metastatic disease. Edema within the left cerebellar hemisphere with mild effacement of fourth ventricle as before. Mild mass effect on the left midbrain. The ventricles are stable in size. Confluent hypodensity within the bilateral white matter as before. Atrophy. Vascular: No hyperdense vessels. Scattered calcifications at the carotid siphons. Skull: No fracture.  No suspicious bone lesion. Sinuses/Orbits: Mild mucosal thickening in the ethmoid sinuses. No acute orbital abnormality. Other: None CT CERVICAL SPINE FINDINGS Alignment: No subluxation is seen. Facet alignment is within normal limits. Skull base and vertebrae: No acute fracture. No primary bone lesion or focal pathologic process. Soft tissues and spinal canal: No prevertebral fluid or swelling. No visible canal hematoma. Disc levels: Status post anterior plate and screw fixation at C5 and C6 with solid bony fusion present. Moderate narrowing and degenerative changes at C6-C7. Multi level bilateral facet hypertrophic arthropathy. Upper chest: Lung apices are clear. 19 mm nodule left lobe of the thyroid gland. Other: None IMPRESSION: 1. Negative for acute intracranial hemorrhage 2. Multifocal hypodensities corresponding to the history of metastatic brain lesions. Confluent white matter hypodensity which may relate to small vessel ischemic change or post radiation change. 3. Status post anterior plate and screw fixation at C5 and C6. No definite acute osseous abnormality 4. 19 mm nodule left lobe of thyroid, may be correlated with nonemergent thyroid ultrasound as clinically appropriate Electronically Signed   By: Donavan Foil M.D.   On: 06/03/2017 23:48    Ct Abdomen Pelvis W Contrast  Result Date: 06/20/2017 CLINICAL DATA:  Extensive stage small cell right lung carcinoma diagnosed September 2016 status post chemotherapy and whole brain radiation therapy. Interval observation. Restaging. EXAM: CT CHEST, ABDOMEN, AND PELVIS WITH CONTRAST TECHNIQUE: Multidetector CT imaging of the chest, abdomen and pelvis was performed following the standard protocol during bolus administration of intravenous contrast. CONTRAST:  15mL ISOVUE-300 IOPAMIDOL (ISOVUE-300) INJECTION 61%, 58mL ISOVUE-300 IOPAMIDOL (ISOVUE-300) INJECTION 61% COMPARISON:  02/06/2017 CT chest, abdomen and pelvis. FINDINGS: CT CHEST FINDINGS Cardiovascular: Normal heart size. No significant pericardial fluid/thickening. Right internal jugular MediPort terminates at the cavoatrial junction. Left anterior descending, left circumflex and right coronary atherosclerosis. Atherosclerotic nonaneurysmal thoracic aorta. Normal caliber pulmonary arteries. No central pulmonary emboli. Mediastinum/Nodes: Stable 1.4 cm hypodense posterior left thyroid lobe nodule. Unremarkable esophagus. No pathologically enlarged axillary, mediastinal or hilar lymph nodes. Lungs/Pleura: No pneumothorax. No pleural effusion. Stable minimal tree-in-bud type opacity in the peripheral right upper lobe, compatible with benign postinflammatory change. No acute consolidative airspace disease, lung masses or new significant pulmonary nodules. Musculoskeletal: No aggressive appearing focal osseous lesions. Mild thoracic spondylosis. Partially visualized  surgical hardware from ACDF in the lower cervical spine. CT ABDOMEN PELVIS FINDINGS Hepatobiliary: Normal liver with no liver mass. Normal gallbladder with no radiopaque cholelithiasis. No biliary ductal dilatation. Pancreas: Normal, with no mass or duct dilation. Spleen: Normal size. No mass. Adrenals/Urinary Tract: Normal adrenals. No hydronephrosis. Stable subcentimeter hypodense renal  cortical lesion in the lateral upper left kidney, too small to characterize. No new renal lesions. Normal bladder. Stomach/Bowel: Grossly normal stomach. Normal caliber small bowel with no small bowel wall thickening. Appendix not discretely visualized. No pericecal inflammatory changes. Normal large bowel with no diverticulosis, large bowel wall thickening or pericolonic fat stranding. Oral contrast reaches the distal rectum. Vascular/Lymphatic: Atherosclerotic nonaneurysmal abdominal aorta. Patent portal, splenic, hepatic and renal veins. No pathologically enlarged lymph nodes in the abdomen or pelvis. Reproductive: Mildly enlarged prostate with nonspecific internal prostatic calcifications, stable. Other: No pneumoperitoneum, ascites or focal fluid collection. Musculoskeletal: No aggressive appearing focal osseous lesions. Mild lumbar spondylosis. IMPRESSION: 1. No evidence of recurrent metastatic disease in the chest, abdomen or pelvis. 2. Aortic Atherosclerosis (ICD10-I70.0). Three-vessel coronary atherosclerosis. Electronically Signed   By: Ilona Sorrel M.D.   On: 06/20/2017 15:22    ASSESSMENT AND PLAN:  This is a very pleasant 71 years old white male with extensive stage small cell lung cancer diagnosed in September 2016 status post systemic chemotherapy with carboplatin and etoposide followed by whole brain irradiation for metastatic brain lesions as well as a stereotactic radiotherapy to her recurrent brain metastasis. The patient has been observation since December 2016 and has been doing fine except for the recurrent and progressive brain metastasis. His recent CT scan of the chest, abdomen and pelvis showed no evidence of recurrent metastatic disease in the chest, abdomen or pelvis. I discussed the scan results with the patient and his family and recommended for him to continue on observation with repeat CT scan of the chest in 3 months. The patient will continue on Decadron taper dose for the  management clinic edema in the brain. For the urine frequency, I recommended for the patient to get a referral from his primary care physician to urology for evaluation. I will see him back for follow-up visit in 3 months for reevaluation with repeat CT scan of the chest, abdomen and pelvis for restaging of his disease. The patient was advised to call immediately if he has any concerning symptoms in the interval. The patient voices understanding of current disease status and treatment options and is in agreement with the current care plan. All questions were answered. The patient knows to call the clinic with any problems, questions or concerns. We can certainly see the patient much sooner if necessary. I spent 10 minutes counseling the patient face to face. The total time spent in the appointment was 15 minutes.  Disclaimer: This note was dictated with voice recognition software. Similar sounding words can inadvertently be transcribed and may not be corrected upon review.

## 2017-06-21 NOTE — Telephone Encounter (Signed)
Gave patient avs and calendar for upcoming appts.  °

## 2017-06-21 NOTE — Patient Instructions (Signed)
Steps to Quit Smoking Smoking tobacco can be bad for your health. It can also affect almost every organ in your body. Smoking puts you and people around you at risk for many serious long-lasting (chronic) diseases. Quitting smoking is hard, but it is one of the best things that you can do for your health. It is never too late to quit. What are the benefits of quitting smoking? When you quit smoking, you lower your risk for getting serious diseases and conditions. They can include:  Lung cancer or lung disease.  Heart disease.  Stroke.  Heart attack.  Not being able to have children (infertility).  Weak bones (osteoporosis) and broken bones (fractures).  If you have coughing, wheezing, and shortness of breath, those symptoms may get better when you quit. You may also get sick less often. If you are pregnant, quitting smoking can help to lower your chances of having a baby of low birth weight. What can I do to help me quit smoking? Talk with your doctor about what can help you quit smoking. Some things you can do (strategies) include:  Quitting smoking totally, instead of slowly cutting back how much you smoke over a period of time.  Going to in-person counseling. You are more likely to quit if you go to many counseling sessions.  Using resources and support systems, such as: ? Online chats with a counselor. ? Phone quitlines. ? Printed self-help materials. ? Support groups or group counseling. ? Text messaging programs. ? Mobile phone apps or applications.  Taking medicines. Some of these medicines may have nicotine in them. If you are pregnant or breastfeeding, do not take any medicines to quit smoking unless your doctor says it is okay. Talk with your doctor about counseling or other things that can help you.  Talk with your doctor about using more than one strategy at the same time, such as taking medicines while you are also going to in-person counseling. This can help make  quitting easier. What things can I do to make it easier to quit? Quitting smoking might feel very hard at first, but there is a lot that you can do to make it easier. Take these steps:  Talk to your family and friends. Ask them to support and encourage you.  Call phone quitlines, reach out to support groups, or work with a counselor.  Ask people who smoke to not smoke around you.  Avoid places that make you want (trigger) to smoke, such as: ? Bars. ? Parties. ? Smoke-break areas at work.  Spend time with people who do not smoke.  Lower the stress in your life. Stress can make you want to smoke. Try these things to help your stress: ? Getting regular exercise. ? Deep-breathing exercises. ? Yoga. ? Meditating. ? Doing a body scan. To do this, close your eyes, focus on one area of your body at a time from head to toe, and notice which parts of your body are tense. Try to relax the muscles in those areas.  Download or buy apps on your mobile phone or tablet that can help you stick to your quit plan. There are many free apps, such as QuitGuide from the CDC (Centers for Disease Control and Prevention). You can find more support from smokefree.gov and other websites.  This information is not intended to replace advice given to you by your health care provider. Make sure you discuss any questions you have with your health care provider. Document Released: 08/25/2009 Document   Revised: 06/26/2016 Document Reviewed: 03/15/2015 Elsevier Interactive Patient Education  2018 Elsevier Inc.  

## 2017-06-24 ENCOUNTER — Other Ambulatory Visit: Payer: Self-pay | Admitting: Cardiology

## 2017-06-24 DIAGNOSIS — M47812 Spondylosis without myelopathy or radiculopathy, cervical region: Secondary | ICD-10-CM | POA: Diagnosis not present

## 2017-06-24 DIAGNOSIS — Z79891 Long term (current) use of opiate analgesic: Secondary | ICD-10-CM | POA: Diagnosis not present

## 2017-06-24 DIAGNOSIS — M4726 Other spondylosis with radiculopathy, lumbar region: Secondary | ICD-10-CM | POA: Diagnosis not present

## 2017-06-24 DIAGNOSIS — G894 Chronic pain syndrome: Secondary | ICD-10-CM | POA: Diagnosis not present

## 2017-07-01 ENCOUNTER — Ambulatory Visit: Payer: BLUE CROSS/BLUE SHIELD | Admitting: Diagnostic Neuroimaging

## 2017-07-02 ENCOUNTER — Ambulatory Visit
Admission: RE | Admit: 2017-07-02 | Discharge: 2017-07-02 | Disposition: A | Discharge status: Home / Self Care | Payer: Medicare Other | Source: Ambulatory Visit | Attending: Radiation Oncology | Admitting: Radiation Oncology

## 2017-07-02 ENCOUNTER — Other Ambulatory Visit: Payer: Self-pay | Admitting: Radiation Oncology

## 2017-07-02 ENCOUNTER — Encounter: Payer: Self-pay | Admitting: Radiation Oncology

## 2017-07-02 DIAGNOSIS — Z79899 Other long term (current) drug therapy: Secondary | ICD-10-CM | POA: Diagnosis not present

## 2017-07-02 DIAGNOSIS — E039 Hypothyroidism, unspecified: Secondary | ICD-10-CM

## 2017-07-02 DIAGNOSIS — Z888 Allergy status to other drugs, medicaments and biological substances status: Secondary | ICD-10-CM | POA: Diagnosis not present

## 2017-07-02 DIAGNOSIS — Z7982 Long term (current) use of aspirin: Secondary | ICD-10-CM | POA: Diagnosis not present

## 2017-07-02 DIAGNOSIS — C7931 Secondary malignant neoplasm of brain: Secondary | ICD-10-CM

## 2017-07-02 DIAGNOSIS — Z5111 Encounter for antineoplastic chemotherapy: Secondary | ICD-10-CM | POA: Diagnosis not present

## 2017-07-02 DIAGNOSIS — Z923 Personal history of irradiation: Secondary | ICD-10-CM | POA: Diagnosis not present

## 2017-07-02 MED ORDER — FLUCONAZOLE 100 MG PO TABS: ORAL_TABLET | ORAL | 0 refills | Status: AC

## 2017-07-02 MED ORDER — DEXAMETHASONE 2 MG PO TABS: ORAL_TABLET | ORAL | 1 refills | Status: AC

## 2017-07-02 NOTE — Progress Notes (Signed)
Andrew Shaw presents for follow up of radiation completed 05/24/17. He reports chronic back pain which he rates a 8/10. He is taking hydrocodone and oxycontin for this pain. He reports fatigue which has limited is activity. He fell about 3 weeks ago and went to the ED. He was admitted for dehydration. He tells me he is eating and drinking better since that time. He was prescribed decadron 4 mg twice daily which he is still taking. He has oral thrush present at this time.   BP 110/65   Pulse 70   Temp 98.1 F (36.7 C)   Ht 5\' 7"  (1.702 m)   Wt 161 lb 12.8 oz (73.4 kg)   SpO2 99% Comment: room air  BMI 25.34 kg/m    Wt Readings from Last 3 Encounters:  07/02/17 161 lb 12.8 oz (73.4 kg)  06/21/17 155 lb 14.4 oz (70.7 kg)  06/20/17 155 lb (70.3 kg)

## 2017-07-02 NOTE — Progress Notes (Signed)
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 Brain metastases ([facility]) C79.31 fluconazole (DIFLUCAN) 100 MG tablet    dexamethasone (DECADRON) 2 MG tablet    CHIEF COMPLAINT: Here for follow-up and surveillance of brain cancer  Previous Radiation  Radiation treatment dates:   06/11/17  Site/dose:   Right cerebellar 6mmtarget / 20 Gy in 1 fraction   Beams/energy:   SBRT/SRT-3D, 6X-FFF  10/31/2016: PTV1 Left anterior parietal 12 mm was treated to 20 Gy in 1 fraction. PTV2 Left cerebellar 11 mm was treated to 20 Gy in 1 fraction. PTV3 Left temporal 6 mm was treated to 20 Gy in 1 fraction. PTV4 Left Caudate 6 mm was treated to 20 Gy in 1 fraction.  03/06/2016-03/21/2016: Whole brain and right hilum to 30 Gy in 10 fractions at 3 Gy per fraction.   Narrative:  The patient returns today for routine follow-up. He presents to the clinic today accompanied by his wife. Overall he is feeling fatigued. He has been taking his thyroid medications as prescribed.   He did recently have a CT A/P performed on 06/20/17 which did not show evidence of recurrent metastatic disease.   The pt was placed on Decadron and is still taking 4mg  BID. It appears this was started on 06/06/17 Domenic Polite with no taper plan. It looks like this was his d/c medication upon hospitalization at the end of July for acute metabolic encephalopathy presumed to be d/t vasogenic edema from brain mets. He reports that overall he is feeling better than before his admission, but since his discharge he states that his overall condition has remained unchanged. He denies any recent headache, seizures, nausea.     He was also recently dx'd w/ oral thrush by his PCP, Dr Alroy Dust. He was placed on magic mouthwash at that time which his wife states did not improve his symptoms.  He has generalized edema from steroids.   ALLERGIES:  is allergic to cortisone; dronabinol; latex; lisinopril; trental [pentoxifylline]; antihistamines, chlorpheniramine-type; and flexeril [cyclobenzaprine].  Meds: Current Outpatient Prescriptions  Medication Sig Dispense Refill  . aspirin EC 81 MG tablet Take 81 mg by mouth daily.    Marland Kitchen dexamethasone (DECADRON) 2 MG tablet Take 2 tabs QAM until 9/20, then 1 tab QAM until 10/5, then 1/2 tab QAM until 10/31. 50 tablet 1  . fenofibrate (TRICOR) 48 MG tablet Take 48 mg by mouth daily.    Marland Kitchen HYDROcodone-acetaminophen (NORCO) 10-325 MG per tablet Take 1 tablet by mouth every 4 (four) hours as needed (breakthrough pain).     . irbesartan (AVAPRO) 150 MG tablet Take 1 tablet (150 mg total) by mouth daily. Please schedule appointment for refills 90 tablet 0  . levothyroxine (SYNTHROID, LEVOTHROID) 25 MCG tablet Take 1 tablet (25 mcg total) by mouth daily before breakfast. Take with water 45-25min before eating; don't mix w/ vitamins or supplements. 30 tablet 6  . lidocaine-prilocaine (EMLA) cream Apply one application to port a cath 1-2 hours prior to access. 30 g 2  . metoprolol (LOPRESSOR) 50 MG tablet Take 1 tablet (50 mg total) by mouth 2 (two) times daily. PLEASE CONTACT OFFICE FOR ADDITIONAL REFILLS 30 tablet  0  . mometasone (ELOCON) 0.1 % cream Apply 1 application topically daily as needed (for rash). Reported on 05/25/2016  3  . OXYCONTIN 20 MG T12A 12 hr tablet Take 20 mg by mouth every 8 (eight) hours.  0  . rosuvastatin (CRESTOR) 20 MG tablet Take 20 mg by mouth daily.    Marland Kitchen triamcinolone cream (KENALOG) 0.5 % 1 application when needed for rash  1  . vitamin E (VITAMIN E) 400 UNIT capsule Take 1 cap PO daily  x 1 week, then 1 cap PO BID (Patient taking differently: Take 400 Units  by mouth daily. ) 60 capsule 5  . diphenoxylate-atropine (LOMOTIL) 2.5-0.025 MG tablet Take 2 tablets by mouth 4 (four) times daily as needed for diarrhea or loose stools. (Patient not taking: Reported on 06/21/2017) 30 tablet 0  . fluconazole (DIFLUCAN) 100 MG tablet Take 2 tablets today, then 1 tablet daily x 20 more days. Hold Rosuvastatin while on this. 22 tablet 0  . LORazepam (ATIVAN) 1 MG tablet Take 1 tablet (1 mg total) by mouth at bedtime. (Patient not taking: Reported on 07/02/2017) 30 tablet 1  . nitroGLYCERIN (NITROSTAT) 0.4 MG SL tablet Place 1 tablet (0.4 mg total) under the tongue every 5 (five) minutes x 3 doses as needed for chest pain. (Patient not taking: Reported on 06/21/2017) 25 tablet 2  . nystatin (MYCOSTATIN) 100000 UNIT/ML suspension Take 5 mLs by mouth 4 (four) times daily.  1  . ondansetron (ZOFRAN ODT) 4 MG disintegrating tablet Take 1 tablet (4 mg total) by mouth every 8 (eight) hours as needed for nausea or vomiting. (Patient not taking: Reported on 06/21/2017) 20 tablet 0   Current Facility-Administered Medications  Medication Dose Route Frequency Provider Last Rate Last Dose  . sodium chloride 0.9 % injection 10 mL  10 mL Intravenous PRN Eppie Gibson, MD         Physical Findings: The patient is in no acute distress. Patient is alert and oriented.  height is 5\' 7"  (1.702 m) and weight is 161 lb 12.8 oz (73.4 kg). His temperature is 98.1 F (36.7 C). His blood pressure is 110/65 and his pulse is 70. His oxygen saturation is 99%.   No significant changes. General: Alert and oriented, in no acute distress HEENT: Head is normocephalic. Extraocular movements are intact. Oropharynx is clear. Oral thrush on exam. He does exhibit moon facies.  Neck: Neck is supple, no palpable cervical or supraclavicular lymphadenopathy. Heart: Regular in rate and rhythm with no murmurs, rubs, or gallops. Chest: Clear to auscultation bilaterally, with no rhonchi, wheezes, or  rales. Abdomen: Soft, nontender, nondistended, with no rigidity or guarding. Extremities: +pitting pedal/ankle edema. Lymphatics: see Neck Exam Skin: No concerning lesions. Musculoskeletal: symmetric strength and muscle tone throughout. Significant pitting edema to the bilateral ankles and feet.  Neurologic: Cranial nerves II through XII are grossly intact. No obvious focalities. Speech is fluent. Finger to nose testing is somewhat wobbly, but grossly intact. Coordination is intact. He has decreased strength in hip flexion bilaterally. Strength symmetric in bilateral upper extremities.  Psychiatric: Judgment and insight are intact. Affect is blunted.   Lab Findings: Lab Results  Component Value Date   WBC 7.4 06/20/2017   HGB 13.7 06/20/2017   HCT 39.4 06/20/2017   MCV 99.2 (H) 06/20/2017   PLT 140 06/20/2017   Lab Results  Component Value Date   TSH 0.234 (L) 06/20/2017    Radiographic Findings: Dg Chest 2 View  Result Date: 06/03/2017 CLINICAL  DATA:  Nausea and vomiting.  Somnolence.  [employer]. EXAM: CHEST  2 VIEW COMPARISON:  02/06/2017 FINDINGS: Right jugular port extends into the low SVC. The lungs are clear no pleural effusions. Normal heart size. Unremarkable hilar and mediastinal contours. Normal pulmonary vasculature. IMPRESSION: No active cardiopulmonary disease. Electronically Signed   By: Andreas Newport M.D.   On: 06/03/2017 23:08   Ct Head Wo Contrast  Result Date: 06/03/2017 CLINICAL DATA:  Fall with neck pain nausea and vomiting EXAM: CT HEAD WITHOUT CONTRAST CT CERVICAL SPINE WITHOUT CONTRAST TECHNIQUE: Multidetector CT imaging of the head and cervical spine was performed following the standard protocol without intravenous contrast. Multiplanar CT image reconstructions of the cervical spine were also generated. COMPARISON:  MRI 05/17/2017, CT brain 04/24/2017 FINDINGS: CT HEAD FINDINGS Brain: No acute intracranial hemorrhage is seen. Multifocal hypodensities in  the left parietal lobe, left caudate, left posterior temporal lobe, and left cerebellum consistent with history of metastatic disease. Edema within the left cerebellar hemisphere with mild effacement of fourth ventricle as before. Mild mass effect on the left midbrain. The ventricles are stable in size. Confluent hypodensity within the bilateral white matter as before. Atrophy. Vascular: No hyperdense vessels. Scattered calcifications at the carotid siphons. Skull: No fracture.  No suspicious bone lesion. Sinuses/Orbits: Mild mucosal thickening in the ethmoid sinuses. No acute orbital abnormality. Other: None CT CERVICAL SPINE FINDINGS Alignment: No subluxation is seen. Facet alignment is within normal limits. Skull base and vertebrae: No acute fracture. No primary bone lesion or focal pathologic process. Soft tissues and spinal canal: No prevertebral fluid or swelling. No visible canal hematoma. Disc levels: Status post anterior plate and screw fixation at C5 and C6 with solid bony fusion present. Moderate narrowing and degenerative changes at C6-C7. Multi level bilateral facet hypertrophic arthropathy. Upper chest: Lung apices are clear. 19 mm nodule left lobe of the thyroid gland. Other: None IMPRESSION: 1. Negative for acute intracranial hemorrhage 2. Multifocal hypodensities corresponding to the history of metastatic brain lesions. Confluent white matter hypodensity which may relate to small vessel ischemic change or post radiation change. 3. Status post anterior plate and screw fixation at C5 and C6. No definite acute osseous abnormality 4. 19 mm nodule left lobe of thyroid, may be correlated with nonemergent thyroid ultrasound as clinically appropriate Electronically Signed   By: Donavan Foil M.D.   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 Interval observation. Restaging. EXAM: CT CHEST, ABDOMEN, AND PELVIS WITH CONTRAST TECHNIQUE: Multidetector CT imaging of the chest, abdomen and pelvis was performed following the standard protocol during bolus administration of intravenous contrast. CONTRAST:  154mL ISOVUE-300 IOPAMIDOL (ISOVUE-300) INJECTION 61%, 82mL ISOVUE-300 IOPAMIDOL (ISOVUE-300) INJECTION 61% COMPARISON:  02/06/2017 CT chest, abdomen and pelvis. FINDINGS: CT CHEST FINDINGS Cardiovascular: Normal heart size. No significant pericardial fluid/thickening. Right internal jugular MediPort terminates at the cavoatrial junction. Left anterior descending, left circumflex and right coronary atherosclerosis. Atherosclerotic nonaneurysmal thoracic aorta. Normal caliber pulmonary arteries. No central pulmonary emboli. Mediastinum/Nodes: Stable 1.4 cm hypodense posterior left thyroid lobe nodule. Unremarkable esophagus. No pathologically enlarged axillary, mediastinal or hilar lymph nodes. Lungs/Pleura: No pneumothorax. No pleural effusion. Stable minimal tree-in-bud type opacity in the peripheral right upper lobe, compatible with benign postinflammatory change. No acute consolidative airspace disease, lung masses or new significant pulmonary nodules. Musculoskeletal: No aggressive appearing focal osseous lesions.  Mild thoracic spondylosis. Partially visualized surgical hardware from ACDF in the lower cervical spine. CT ABDOMEN PELVIS FINDINGS Hepatobiliary: Normal liver with no liver mass. Normal gallbladder with no radiopaque cholelithiasis. No biliary ductal dilatation. Pancreas: Normal, with no mass or duct dilation. Spleen: Normal size. No mass. Adrenals/Urinary Tract: Normal adrenals. No hydronephrosis. Stable subcentimeter hypodense renal cortical lesion in the lateral upper left kidney, too small to characterize. No new renal lesions. Normal bladder. Stomach/Bowel: Grossly normal stomach. Normal caliber small bowel with no small bowel  wall thickening. Appendix not discretely visualized. No pericecal inflammatory changes. Normal large bowel with no diverticulosis, large bowel wall thickening or pericolonic fat stranding. Oral contrast reaches the distal rectum. Vascular/Lymphatic: Atherosclerotic nonaneurysmal abdominal aorta. Patent portal, splenic, hepatic and renal veins. No pathologically enlarged lymph nodes in the abdomen or pelvis. Reproductive: Mildly enlarged prostate with nonspecific internal prostatic calcifications, stable. Other: No pneumoperitoneum, ascites or focal fluid collection. Musculoskeletal: No aggressive appearing focal osseous lesions. Mild lumbar spondylosis. IMPRESSION: 1. No evidence of recurrent metastatic disease in the chest, abdomen or pelvis. 2. Aortic Atherosclerosis (ICD10-I70.0). Three-vessel coronary atherosclerosis. Electronically Signed   By: Ilona Sorrel M.D.   On: 06/20/2017 15:22   Ct Cervical Spine Wo Contrast  Result Date: 06/03/2017 CLINICAL DATA:  Fall with neck pain nausea and vomiting EXAM: CT HEAD WITHOUT CONTRAST CT CERVICAL SPINE WITHOUT CONTRAST TECHNIQUE: Multidetector CT imaging of the head and cervical spine was performed following the standard protocol without intravenous contrast. Multiplanar CT image reconstructions of the cervical spine were also generated. COMPARISON:  MRI 05/17/2017, CT brain 04/24/2017 FINDINGS: CT HEAD FINDINGS Brain: No acute intracranial hemorrhage is seen. Multifocal hypodensities in the left parietal lobe, left caudate, left posterior temporal lobe, and left cerebellum consistent with history of metastatic disease. Edema within the left cerebellar hemisphere with mild effacement of fourth ventricle as before. Mild mass effect on the left midbrain. The ventricles are stable in size. Confluent hypodensity within the bilateral white matter as before. Atrophy. Vascular: No hyperdense vessels. Scattered calcifications at the carotid siphons. Skull: No fracture.  No  suspicious bone lesion. Sinuses/Orbits: Mild mucosal thickening in the ethmoid sinuses. No acute orbital abnormality. Other: None CT CERVICAL SPINE FINDINGS Alignment: No subluxation is seen. Facet alignment is within normal limits. Skull base and vertebrae: No acute fracture. No primary bone lesion or focal pathologic process. Soft tissues and spinal canal: No prevertebral fluid or swelling. No visible canal hematoma. Disc levels: Status post anterior plate and screw fixation at C5 and C6 with solid bony fusion present. Moderate narrowing and degenerative changes at C6-C7. Multi level bilateral facet hypertrophic arthropathy. Upper chest: Lung apices are clear. 19 mm nodule left lobe of the thyroid gland. Other: None IMPRESSION: 1. Negative for acute intracranial hemorrhage 2. Multifocal hypodensities corresponding to the history of metastatic brain lesions. Confluent white matter hypodensity which may relate to small vessel ischemic change or post radiation change. 3. Status post anterior plate and screw fixation at C5 and C6. No definite acute osseous abnormality 4. 19 mm nodule left lobe of thyroid, may be correlated with nonemergent thyroid ultrasound as clinically appropriate Electronically Signed   By: Donavan Foil M.D.   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 Interval observation. Restaging. EXAM: CT CHEST, ABDOMEN, AND PELVIS WITH CONTRAST TECHNIQUE: Multidetector CT imaging of the chest,  abdomen and pelvis was performed following the standard protocol during bolus administration of intravenous contrast. CONTRAST:  156mL ISOVUE-300 IOPAMIDOL (ISOVUE-300) INJECTION 61%, 8mL ISOVUE-300 IOPAMIDOL (ISOVUE-300) INJECTION 61% COMPARISON:  02/06/2017 CT chest, abdomen and pelvis. FINDINGS: CT CHEST FINDINGS Cardiovascular:  Normal heart size. No significant pericardial fluid/thickening. Right internal jugular MediPort terminates at the cavoatrial junction. Left anterior descending, left circumflex and right coronary atherosclerosis. Atherosclerotic nonaneurysmal thoracic aorta. Normal caliber pulmonary arteries. No central pulmonary emboli. Mediastinum/Nodes: Stable 1.4 cm hypodense posterior left thyroid lobe nodule. Unremarkable esophagus. No pathologically enlarged axillary, mediastinal or hilar lymph nodes. Lungs/Pleura: No pneumothorax. No pleural effusion. Stable minimal tree-in-bud type opacity in the peripheral right upper lobe, compatible with benign postinflammatory change. No acute consolidative airspace disease, lung masses or new significant pulmonary nodules. Musculoskeletal: No aggressive appearing focal osseous lesions. Mild thoracic spondylosis. Partially visualized surgical hardware from ACDF in the lower cervical spine. CT ABDOMEN PELVIS FINDINGS Hepatobiliary: Normal liver with no liver mass. Normal gallbladder with no radiopaque cholelithiasis. No biliary ductal dilatation. Pancreas: Normal, with no mass or duct dilation. Spleen: Normal size. No mass. Adrenals/Urinary Tract: Normal adrenals. No hydronephrosis. Stable subcentimeter hypodense renal cortical lesion in the lateral upper left kidney, too small to characterize. No new renal lesions. Normal bladder. Stomach/Bowel: Grossly normal stomach. Normal caliber small bowel with no small bowel wall thickening. Appendix not discretely visualized. No pericecal inflammatory changes. Normal large bowel with no diverticulosis, large bowel wall thickening or pericolonic fat stranding. Oral contrast reaches the distal rectum. Vascular/Lymphatic: Atherosclerotic nonaneurysmal abdominal aorta. Patent portal, splenic, hepatic and renal veins. No pathologically enlarged lymph nodes in the abdomen or pelvis. Reproductive: Mildly enlarged prostate with nonspecific internal  prostatic calcifications, stable. Other: No pneumoperitoneum, ascites or focal fluid collection. Musculoskeletal: No aggressive appearing focal osseous lesions. Mild lumbar spondylosis. IMPRESSION: 1. No evidence of recurrent metastatic disease in the chest, abdomen or pelvis. 2. Aortic Atherosclerosis (ICD10-I70.0). Three-vessel coronary atherosclerosis. Electronically Signed   By: Ilona Sorrel M.D.   On: 06/20/2017 15:22    Impression/Plan:  We will begin him on a steroid taper and anti-fungal for oral thrush. He will begin taking two 2mg  tablets until 09/20, and then 1 tablet until 10/05, and half a tablet until 10/31. I have also advised that he f/u w/ his Neurosurgeon on the last week of September and we'll arrange that visit with brain MRI.   I advised him that if he begins to feel worse throughout this taper that he needs to call and have f/u ASAP with [place].   His wife also expresses that they did speak with Dr Alroy Dust about his oral thrush, but the magic mouthwash did not improve his symptoms. I will prescribe him an anti-fungal for this.  He is currently on Crestor, I advised him and his wife to stop taking this medication for three weeks as this can interact with the anti-fungal fluconazole I will place him on. I also urged him to stop using the magic mouthwash if this is not aiding in improving his symptoms.   Free T4 pending today for hypothyroidism, fatigue.  I spent 20 minutes face to face with the patient and more than 50% of that time was spent in counseling and/or coordination of care. _____________________________________   Eppie Gibson, MD  This document serves as a record of services personally performed by Eppie Gibson, MD. It was created on his behalf by Reola Mosher, a trained medical scribe. The creation of this record is based on the  scribe's personal observations and the provider's statements to them. This document has been checked and approved by the attending  provider.

## 2017-07-03 LAB — T4, FREE: FREE T4: 1.02 ng/dL (ref 0.82–1.77)

## 2017-07-04 ENCOUNTER — Other Ambulatory Visit: Payer: Self-pay | Admitting: Radiation Therapy

## 2017-07-04 DIAGNOSIS — C7931 Secondary malignant neoplasm of brain: Secondary | ICD-10-CM

## 2017-07-08 ENCOUNTER — Telehealth: Payer: Self-pay

## 2017-07-08 NOTE — Telephone Encounter (Signed)
Ms. Frett called me today concerned about increased swelling to [name]'s lower extremities. She tells me that his legs are not painful, red, or warm. I informed Dr. Isidore Moos of his symptoms and she recommends that [name] contact his PCP to be evaluated as soon as possible and keep his legs elevated. I called and spoke to Ms. Cowper and informed her of Dr. Pearlie Oyster recommendations. She voice her understanding and appreciation for the phone call.

## 2017-07-09 DIAGNOSIS — R296 Repeated falls: Secondary | ICD-10-CM | POA: Diagnosis not present

## 2017-07-09 DIAGNOSIS — R5383 Other fatigue: Secondary | ICD-10-CM | POA: Diagnosis not present

## 2017-07-09 DIAGNOSIS — B370 Candidal stomatitis: Secondary | ICD-10-CM | POA: Diagnosis not present

## 2017-07-09 DIAGNOSIS — R609 Edema, unspecified: Secondary | ICD-10-CM | POA: Diagnosis not present

## 2017-07-15 DIAGNOSIS — I10 Essential (primary) hypertension: Secondary | ICD-10-CM | POA: Diagnosis not present

## 2017-07-15 DIAGNOSIS — C7931 Secondary malignant neoplasm of brain: Secondary | ICD-10-CM | POA: Diagnosis not present

## 2017-07-15 DIAGNOSIS — R296 Repeated falls: Secondary | ICD-10-CM | POA: Diagnosis not present

## 2017-07-15 DIAGNOSIS — Z9181 History of falling: Secondary | ICD-10-CM | POA: Diagnosis not present

## 2017-07-15 DIAGNOSIS — I2510 Atherosclerotic heart disease of native coronary artery without angina pectoris: Secondary | ICD-10-CM | POA: Diagnosis not present

## 2017-07-15 DIAGNOSIS — E782 Mixed hyperlipidemia: Secondary | ICD-10-CM | POA: Diagnosis not present

## 2017-07-15 DIAGNOSIS — R530 Neoplastic (malignant) related fatigue: Secondary | ICD-10-CM | POA: Diagnosis not present

## 2017-07-15 DIAGNOSIS — M519 Unspecified thoracic, thoracolumbar and lumbosacral intervertebral disc disorder: Secondary | ICD-10-CM | POA: Diagnosis not present

## 2017-07-15 DIAGNOSIS — G8929 Other chronic pain: Secondary | ICD-10-CM | POA: Diagnosis not present

## 2017-07-15 DIAGNOSIS — F17210 Nicotine dependence, cigarettes, uncomplicated: Secondary | ICD-10-CM | POA: Diagnosis not present

## 2017-07-15 DIAGNOSIS — C3491 Malignant neoplasm of unspecified part of right bronchus or lung: Secondary | ICD-10-CM | POA: Diagnosis not present

## 2017-07-17 DIAGNOSIS — G8929 Other chronic pain: Secondary | ICD-10-CM | POA: Diagnosis not present

## 2017-07-17 DIAGNOSIS — C3491 Malignant neoplasm of unspecified part of right bronchus or lung: Secondary | ICD-10-CM | POA: Diagnosis not present

## 2017-07-17 DIAGNOSIS — C7931 Secondary malignant neoplasm of brain: Secondary | ICD-10-CM | POA: Diagnosis not present

## 2017-07-17 DIAGNOSIS — R296 Repeated falls: Secondary | ICD-10-CM | POA: Diagnosis not present

## 2017-07-17 DIAGNOSIS — I2510 Atherosclerotic heart disease of native coronary artery without angina pectoris: Secondary | ICD-10-CM | POA: Diagnosis not present

## 2017-07-17 DIAGNOSIS — R530 Neoplastic (malignant) related fatigue: Secondary | ICD-10-CM | POA: Diagnosis not present

## 2017-07-19 DIAGNOSIS — C7931 Secondary malignant neoplasm of brain: Secondary | ICD-10-CM | POA: Diagnosis not present

## 2017-07-19 DIAGNOSIS — G8929 Other chronic pain: Secondary | ICD-10-CM | POA: Diagnosis not present

## 2017-07-19 DIAGNOSIS — R296 Repeated falls: Secondary | ICD-10-CM | POA: Diagnosis not present

## 2017-07-19 DIAGNOSIS — I2510 Atherosclerotic heart disease of native coronary artery without angina pectoris: Secondary | ICD-10-CM | POA: Diagnosis not present

## 2017-07-19 DIAGNOSIS — C3491 Malignant neoplasm of unspecified part of right bronchus or lung: Secondary | ICD-10-CM | POA: Diagnosis not present

## 2017-07-19 DIAGNOSIS — R530 Neoplastic (malignant) related fatigue: Secondary | ICD-10-CM | POA: Diagnosis not present

## 2017-07-22 ENCOUNTER — Other Ambulatory Visit: Payer: Self-pay | Admitting: Cardiology

## 2017-07-23 DIAGNOSIS — R296 Repeated falls: Secondary | ICD-10-CM | POA: Diagnosis not present

## 2017-07-23 DIAGNOSIS — C3491 Malignant neoplasm of unspecified part of right bronchus or lung: Secondary | ICD-10-CM | POA: Diagnosis not present

## 2017-07-23 DIAGNOSIS — I2510 Atherosclerotic heart disease of native coronary artery without angina pectoris: Secondary | ICD-10-CM | POA: Diagnosis not present

## 2017-07-23 DIAGNOSIS — C7931 Secondary malignant neoplasm of brain: Secondary | ICD-10-CM | POA: Diagnosis not present

## 2017-07-23 DIAGNOSIS — G8929 Other chronic pain: Secondary | ICD-10-CM | POA: Diagnosis not present

## 2017-07-23 DIAGNOSIS — R530 Neoplastic (malignant) related fatigue: Secondary | ICD-10-CM | POA: Diagnosis not present

## 2017-07-25 DIAGNOSIS — I2510 Atherosclerotic heart disease of native coronary artery without angina pectoris: Secondary | ICD-10-CM | POA: Diagnosis not present

## 2017-07-25 DIAGNOSIS — R530 Neoplastic (malignant) related fatigue: Secondary | ICD-10-CM | POA: Diagnosis not present

## 2017-07-25 DIAGNOSIS — G8929 Other chronic pain: Secondary | ICD-10-CM | POA: Diagnosis not present

## 2017-07-25 DIAGNOSIS — R296 Repeated falls: Secondary | ICD-10-CM | POA: Diagnosis not present

## 2017-07-25 DIAGNOSIS — C7931 Secondary malignant neoplasm of brain: Secondary | ICD-10-CM | POA: Diagnosis not present

## 2017-07-25 DIAGNOSIS — C3491 Malignant neoplasm of unspecified part of right bronchus or lung: Secondary | ICD-10-CM | POA: Diagnosis not present

## 2017-07-26 ENCOUNTER — Ambulatory Visit (HOSPITAL_COMMUNITY)
Admission: RE | Admit: 2017-07-26 | Discharge: 2017-07-26 | Disposition: A | Discharge status: Home / Self Care | Payer: Medicare Other | Source: Ambulatory Visit | Attending: Radiation Oncology | Admitting: Radiation Oncology

## 2017-07-26 DIAGNOSIS — C3490 Malignant neoplasm of unspecified part of unspecified bronchus or lung: Secondary | ICD-10-CM | POA: Diagnosis not present

## 2017-07-26 DIAGNOSIS — C7931 Secondary malignant neoplasm of brain: Secondary | ICD-10-CM

## 2017-07-26 MED ORDER — GADOBENATE DIMEGLUMINE 529 MG/ML IV SOLN: 15.0000 mL | Freq: Once | INTRAVENOUS | Status: DC | PRN

## 2017-07-29 DIAGNOSIS — C7931 Secondary malignant neoplasm of brain: Secondary | ICD-10-CM | POA: Diagnosis not present

## 2017-07-29 DIAGNOSIS — R530 Neoplastic (malignant) related fatigue: Secondary | ICD-10-CM | POA: Diagnosis not present

## 2017-07-29 DIAGNOSIS — G8929 Other chronic pain: Secondary | ICD-10-CM | POA: Diagnosis not present

## 2017-07-29 DIAGNOSIS — R296 Repeated falls: Secondary | ICD-10-CM | POA: Diagnosis not present

## 2017-07-29 DIAGNOSIS — C3491 Malignant neoplasm of unspecified part of right bronchus or lung: Secondary | ICD-10-CM | POA: Diagnosis not present

## 2017-07-29 DIAGNOSIS — I2510 Atherosclerotic heart disease of native coronary artery without angina pectoris: Secondary | ICD-10-CM | POA: Diagnosis not present

## 2017-07-30 MED ORDER — GADOBENATE DIMEGLUMINE 529 MG/ML IV SOLN
15.0000 mL | Freq: Once | INTRAVENOUS | Status: AC | PRN
  Administered 2017-07-25: 15 mL via INTRAVENOUS

## 2017-08-01 DIAGNOSIS — I2510 Atherosclerotic heart disease of native coronary artery without angina pectoris: Secondary | ICD-10-CM | POA: Diagnosis not present

## 2017-08-01 DIAGNOSIS — C7931 Secondary malignant neoplasm of brain: Secondary | ICD-10-CM | POA: Diagnosis not present

## 2017-08-01 DIAGNOSIS — R296 Repeated falls: Secondary | ICD-10-CM | POA: Diagnosis not present

## 2017-08-01 DIAGNOSIS — R530 Neoplastic (malignant) related fatigue: Secondary | ICD-10-CM | POA: Diagnosis not present

## 2017-08-01 DIAGNOSIS — C3491 Malignant neoplasm of unspecified part of right bronchus or lung: Secondary | ICD-10-CM | POA: Diagnosis not present

## 2017-08-01 DIAGNOSIS — G8929 Other chronic pain: Secondary | ICD-10-CM | POA: Diagnosis not present

## 2017-08-05 ENCOUNTER — Ambulatory Visit (HOSPITAL_COMMUNITY): Payer: Medicare Other

## 2017-08-05 DIAGNOSIS — C7931 Secondary malignant neoplasm of brain: Secondary | ICD-10-CM | POA: Diagnosis not present

## 2017-08-09 ENCOUNTER — Other Ambulatory Visit: Payer: Medicare Other

## 2017-08-12 ENCOUNTER — Encounter: Payer: Self-pay | Admitting: Radiation Oncology

## 2017-08-12 ENCOUNTER — Ambulatory Visit (HOSPITAL_BASED_OUTPATIENT_CLINIC_OR_DEPARTMENT_OTHER)
Admission: RE | Admit: 2017-08-12 | Discharge: 2017-08-12 | Disposition: A | Discharge status: Home / Self Care | Payer: Medicare Other | Source: Ambulatory Visit | Attending: Radiation Oncology | Admitting: Radiation Oncology

## 2017-08-12 ENCOUNTER — Ambulatory Visit
Admission: RE | Admit: 2017-08-12 | Discharge: 2017-08-12 | Disposition: A | Discharge status: Home / Self Care | Payer: Medicare Other | Source: Ambulatory Visit | Attending: Radiation Oncology | Admitting: Radiation Oncology

## 2017-08-12 DIAGNOSIS — C7931 Secondary malignant neoplasm of brain: Secondary | ICD-10-CM | POA: Diagnosis not present

## 2017-08-12 DIAGNOSIS — Z923 Personal history of irradiation: Secondary | ICD-10-CM | POA: Diagnosis not present

## 2017-08-12 DIAGNOSIS — Z66 Do not resuscitate: Secondary | ICD-10-CM

## 2017-08-12 DIAGNOSIS — Z888 Allergy status to other drugs, medicaments and biological substances status: Secondary | ICD-10-CM | POA: Diagnosis not present

## 2017-08-12 DIAGNOSIS — Z515 Encounter for palliative care: Secondary | ICD-10-CM

## 2017-08-12 DIAGNOSIS — Z79899 Other long term (current) drug therapy: Secondary | ICD-10-CM | POA: Diagnosis not present

## 2017-08-12 DIAGNOSIS — Z7982 Long term (current) use of aspirin: Secondary | ICD-10-CM | POA: Diagnosis not present

## 2017-08-12 NOTE — Progress Notes (Signed)
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 Brain metastases ([place]) C79.31     CHIEF COMPLAINT: Here for follow-up and surveillance of brain cancer  Previous Radiation  Radiation treatment dates:   06/11/17  Site/dose:   Right cerebellar 1mmtarget / 20 Gy in 1 fraction   Beams/energy:   SBRT/SRT-3D, 6X-FFF  10/31/2016: PTV1 Left anterior parietal 12 mm was treated to 20 Gy in 1 fraction. PTV2 Left cerebellar 11 mm was treated to 20 Gy in 1 fraction. PTV3 Left temporal 6 mm was treated to 20 Gy in 1 fraction. PTV4 Left Caudate 6 mm was treated to 20 Gy in 1 fraction.  03/06/2016-03/21/2016: Whole brain and right hilum to 30 Gy in 10 fractions at 3 Gy per fraction.   Narrative:  The patient returns today for urgent follow-up. He presents to the clinic today accompanied by his wife and family.  Brain MRI shows radionecrosis vs progressive disease. He denies significant HAs or dizziness or persistent nausea.  Completed Decadron taper.  Is somnolent at times, minimally interactive.  Eating less.  Weak.  Poor cognition. Surgery not an option per Andrew Shaw.  Last set of CT scans of visceral sites reviewed - no systemic disease.  ALLERGIES:  is allergic to cortisone; dronabinol; latex; lisinopril; trental [pentoxifylline]; antihistamines, chlorpheniramine-type; and flexeril [cyclobenzaprine].  Meds: Current Outpatient Prescriptions  Medication Sig Dispense Refill  . aspirin EC 81 MG tablet Take 81 mg by mouth daily.    . fenofibrate (TRICOR) 48 MG tablet Take 48 mg by mouth daily.    . irbesartan (AVAPRO) 150 MG tablet Take 1 tablet (150 mg total) by mouth  daily. Please schedule appointment for refills 90 tablet 0  . levothyroxine (SYNTHROID, LEVOTHROID) 25 MCG tablet Take 1 tablet (25 mcg total) by mouth daily before breakfast. Take with water 45-63min before eating; don't mix w/ vitamins or supplements. 30 tablet 6  . lidocaine-prilocaine (EMLA) cream Apply one application to port a cath 1-2 hours prior to access. 30 g 2  . metoprolol (LOPRESSOR) 50 MG tablet Take 1 tablet (50 mg total) by mouth 2 (two) times daily. PLEASE CONTACT OFFICE FOR ADDITIONAL REFILLS 30 tablet 0  . mometasone (ELOCON) 0.1 % cream Apply 1 application topically daily as needed (for rash). Reported on 05/25/2016  3  . nystatin (MYCOSTATIN) 100000 UNIT/ML suspension Take 5 mLs by mouth 4 (four) times daily.  1  . OXYCONTIN 20 MG T12A 12 hr tablet Take 20 mg by mouth every 8 (eight) hours.  0  . rosuvastatin (CRESTOR) 20 MG tablet Take 20 mg by mouth daily.    . vitamin E (VITAMIN E) 400 UNIT capsule Take 1 cap PO daily  x 1 week, then 1 cap PO BID (Patient taking differently: Take 400 Units by mouth daily. ) 60 capsule 5  . dexamethasone (DECADRON) 2 MG tablet Take 2 tabs QAM until 9/20, then 1 tab QAM until 10/5, then 1/2 tab QAM until 10/31. (Patient not taking: Reported on 08/12/2017) 50 tablet 1  . diphenoxylate-atropine (LOMOTIL) 2.5-0.025 MG tablet Take 2 tablets by mouth 4 (four) times daily as needed for diarrhea or loose stools. (Patient  not taking: Reported on 06/21/2017) 30 tablet 0  . fluconazole (DIFLUCAN) 100 MG tablet Take 2 tablets today, then 1 tablet daily x 20 more days. Hold Rosuvastatin while on this. (Patient not taking: Reported on 08/12/2017) 22 tablet 0  . HYDROcodone-acetaminophen (NORCO) 10-325 MG per tablet Take 1 tablet by mouth every 4 (four) hours as needed (breakthrough pain).     . LORazepam (ATIVAN) 1 MG tablet Take 1 tablet (1 mg total) by mouth at bedtime. (Patient not taking: Reported on 07/02/2017) 30 tablet 1  . nitroGLYCERIN (NITROSTAT) 0.4 MG  SL tablet Place 1 tablet (0.4 mg total) under the tongue every 5 (five) minutes x 3 doses as needed for chest pain. (Patient not taking: Reported on 06/21/2017) 25 tablet 2  . ondansetron (ZOFRAN ODT) 4 MG disintegrating tablet Take 1 tablet (4 mg total) by mouth every 8 (eight) hours as needed for nausea or vomiting. (Patient not taking: Reported on 06/21/2017) 20 tablet 0  . triamcinolone cream (KENALOG) 0.5 % 1 application when needed for rash  1   Current Facility-Administered Medications  Medication Dose Route Frequency Provider Last Rate Last Dose  . sodium chloride 0.9 % injection 10 mL  10 mL Intravenous PRN Andrew Gibson, MD         Physical Findings: The patient is in no acute distress.   temperature is 98.5 F (36.9 C). His blood pressure is 103/61 and his pulse is 66. His oxygen saturation is 98%.   No significant changes. General: in Andrew Shaw, minimally interactive HEENT: Head is normocephalic. No Oral thrush on exam. Heart: Regular in rate and rhythm   Chest: Clear to auscultation bilaterally, with no rhonchi, wheezes, or rales. Abdomen: Soft, nontender, nondistended, with no rigidity or guarding. Extremities: +pitting pedal/ankle edema. Neurologic: Does not answer questions verbally.  Occasionally smiles faintly.  Obeys some commands with repeated encouragement Psychiatric:   Affect is blunted.   Lab Findings: Lab Results  Component Value Date   WBC 7.4 06/20/2017   HGB 13.7 06/20/2017   HCT 39.4 06/20/2017   MCV 99.2 (H) 06/20/2017   PLT 140 06/20/2017   Lab Results  Component Value Date   TSH 0.234 (L) 06/20/2017    Radiographic Findings: Mr Andrew Shaw RJ Contrast  Result Date: 07/26/2017 CLINICAL DATA:  Metastatic lung cancer. Post stereotactic radiosurgery EXAM: MRI HEAD WITHOUT AND WITH CONTRAST TECHNIQUE: Multiplanar, multiecho pulse sequences of the brain and surrounding structures were obtained without and with intravenous contrast. CONTRAST:  15 mL MultiHance IV  COMPARISON:  MRI 05/17/2017 FINDINGS: Brain: Large mass lesion left cerebellum with mild hemorrhage shows interval enlargement. This lesion now measures 3.5 x 3.1 cm compared with 2.5 x 2.6 cm. Large amount of surrounding edema similar to the prior study. Small right cerebellar lesion no longer visualized Left posterior temporal lesion now measures 11.5 x 12.2 mm compared with 8.5 x 10.0 mm and appears slightly larger. Left parietal hemorrhagic lesion is unchanged now measuring 10.3 x 12.2 mm Left caudate hemorrhagic lesion unchanged measuring 7 x 11 mm No new enhancing lesions identified. Extensive white matter hyperintensity bilaterally unchanged. Negative for acute infarct. Negative for hydrocephalus. No shift of the midline structures. Vascular: Normal arterial flow void Skull and upper cervical spine: Negative Sinuses/Orbits: Negative Other: None IMPRESSION: Mixed response. Interval enlargement of large left cerebellar lesion and left posterior temporal lesion. No change in left caudate and left parietal lesion. Right cerebellar lesion has resolved. No new lesions. Electronically Signed   By: Juanda Crumble  Carlis Abbott M.D.   On: 07/26/2017 11:16    Impression/Plan:  Andrew Shaw and I discussed him in detail.  His prognosis is poor. Life expectancy ~1-2 mo.  His neurocognitive decline may be related to WBRT and his metastases.  There is not a good option to reverse this process.  We recommend hospice at this time.  Family and patient are receptive to this.  [name] NP of palliative care will arrange.  I will inform Andrew Earlie Server.  Further f/u will be PRN only.  Family was grateful for this appt. _____________________________________   Andrew Gibson, MD

## 2017-08-12 NOTE — Consult Note (Signed)
Consultation Note Date: 08/12/2017   Patient Name: Andrew Shaw  DOB: 04-22-1946  MRN: [id number]  Age / Sex: 71 y.o., male  PCP: Andrew Shaw, L.Andrew Sa, MD Referring Physician: Eppie Gibson, MD  Reason for Consultation: Establishing goals of care and Psychosocial/spiritual support  HPI/Patient Profile: 71 y.o. 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  Status post systemic chemotherapy and radiation.  Continued progression of disease  Patient has had continued physical, functional, and cognitive decline over the past 6 weeks.    Patient and family face treatment option decisions, advanced directive decisions, and anticipatory care needs.    Clinical Assessment and Goals of Care:   This NP Andrew Shaw reviewed medical records, received report from team, and then meet  the patient  in the OP radiation-oncology clinic  along with his wife, daughter and a friend to discuss diagnosis, prognosis, GOC, EOL wishes disposition and options.  A  discussion was had today regarding advanced directives.  Concepts specific to code status, artifical feeding and hydration, continued IV antibiotics and rehospitalization was had.  The difference between a aggressive medical intervention path  and a palliative comfort care path for this patient at this time was had.  Values and goals of care important to patient and family were attempted to be elicited.  Family is very clear that patient would never want any aggressive medical intervetnions to prolong life   MOST form introduced  Concept of Hospice and Palliative Care were discussed  Natural trajectory and expectations at EOL were discussed.  Questions and concerns addressed.   Family encouraged to call with questions or concerns.  PMT will continue to  support holistically.   HCPOA/wife    SUMMARY OF RECOMMENDATIONS    Code Status/Advance Care Planning:  DNR  Symptom Management:   Chronic back pain:  Has been a patient of Dr Andrew Shaw in am OP pain clinic for 18 years.  Today with input from his family mediations are simplified and minimize to:           Oxycodone 10 mg po every 4 hrs prn            Tylenol 650 mg by mouth every 6 hours when necessary for mild pain and discomfort  Palliative Prophylaxis:   Bowel Regimen and Frequent Pain Assessment  Additional Recommendations (Limitations, Scope, Preferences):  Full Comfort Care-  no further life-prolonging measures.  Psycho-social/Spiritual:    Additional Recommendations: Education on Hospice  Prognosis:   < 3 months   Hospice referral made to hospice and palliative care of Andrew Shaw     Primary Diagnoses: Present on Admission: **None**   I have reviewed the medical record, interviewed the patient and family, and examined the patient. The following aspects are pertinent.  Past Medical History:  Diagnosis Date  . Arthritis   . Back pain 12/23/2012  . BPH (benign prostatic hyperplasia)   . Brain metastases ([place])  dx'd 02/2016  . Carpal tunnel syndrome   . Carpal tunnel syndrome, right   . Chronic back pain   . COPD (chronic obstructive pulmonary disease) ([place])   . Coronary artery disease    2D ECHO, 10/31/2010 - EF >55%, normal; NUCLEAR STRESS TEST, 10/23/2010 - perfusion defect in inferior myocardial region, post-stress EF 62%, EKG negative for ischemia  . DVT (deep venous thrombosis) ([place])    history 2004 after knee surg  . GERD (gastroesophageal reflux disease)   . History of radiation therapy 10/31/2016   SRS radiation, Left anterior parietal, Left cerebellar, Left temporal, Left Caudate (each 20 Gy in 1 fraction)  . Hx of radiation therapy 03/06/16- 03/21/2016   Whole Brain and Right [place]  . Hyperlipemia   . Hypertension   . Lung cancer ([place])  dx'd 07/2015  . 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 Neuromuscular disorder ([place])   . 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 S/P angioplasty with stent, BMS to LCX  12/23/12 12/23/2012  . Shortness of breath    with exertion  . Skin rash 08/22/2015   Social History   Social History  . Marital status: Married    Spouse name: N/A  . Number of children: N/A  . Years of education: N/A   Social History Main Topics  . Smoking status: Current Some Day Smoker    Packs/day: 0.25    Years: 50.00    Types: Cigarettes  . Smokeless tobacco: Never Used     Comment: 1 pack will last pt 2 days//ee 3.2.17  . Alcohol use No  . Drug use: No  . Sexual activity: Not on file   Other Topics Concern  . Not on file   Social History Narrative  . No narrative on file   Family History  Problem Relation Age of Onset  . Heart disease Mother   . Hypertension Sister   . Diabetes Sister   . Cancer Brother    Scheduled Meds: Continuous Infusions: PRN Meds:.sodium chloride Medications Prior to Admission:  Prior to Admission medications   Medication Sig Start Date End Date Taking? Authorizing Provider  aspirin EC 81 MG tablet Take 81 mg by mouth daily.    [provider]  dexamethasone (DECADRON) 2 MG tablet Take 2 tabs QAM until 9/20, then 1 tab QAM until 10/5, then 1/2 tab QAM until 10/31. Patient not taking: Reported on 08/12/2017 07/02/17   Andrew Gibson, MD  diphenoxylate-atropine (LOMOTIL) 2.5-0.025 MG tablet Take 2 tablets by mouth 4 (four) times daily as needed for diarrhea or loose stools. Patient not taking: Reported on 06/21/2017 05/17/16   Andrew Borders, NP  fenofibrate (TRICOR) 48 MG tablet Take 48 mg by mouth daily.    [provider]  fluconazole (DIFLUCAN) 100 MG tablet Take 2 tablets today, then 1 tablet daily x 20 more days. Hold Rosuvastatin while on this. Patient not taking: Reported on 08/12/2017 07/02/17   Andrew Gibson, MD    HYDROcodone-acetaminophen [facility]) 10-325 MG per tablet Take 1 tablet by mouth every 4 (four) hours as needed (breakthrough pain).     [provider]  irbesartan (AVAPRO) 150 MG tablet Take 1 tablet (150 mg total) by mouth daily. Please schedule appointment for refills 06/25/17   Andrew Shaw Harp, MD  levothyroxine (SYNTHROID, LEVOTHROID) 25 MCG tablet Take 1 tablet (25 mcg total) by mouth daily before breakfast. Take with water 45-54min before eating; don't mix w/ vitamins or supplements.  05/24/17   Andrew Gibson, MD  lidocaine-prilocaine (EMLA) cream Apply one application to port a cath 1-2 hours prior to access. 10/18/15   Curt Bears, MD  LORazepam (ATIVAN) 1 MG tablet Take 1 tablet (1 mg total) by mouth at bedtime. Patient not taking: Reported on 07/02/2017 02/28/16   Thea Silversmith, MD  metoprolol (LOPRESSOR) 50 MG tablet Take 1 tablet (50 mg total) by mouth 2 (two) times daily. PLEASE CONTACT OFFICE FOR ADDITIONAL REFILLS 06/19/16   Andrew Shaw Harp, MD  mometasone (ELOCON) 0.1 % cream Apply 1 application topically daily as needed (for rash). Reported on 05/25/2016 06/24/15   [provider]  nitroGLYCERIN (NITROSTAT) 0.4 MG SL tablet Place 1 tablet (0.4 mg total) under the tongue every 5 (five) minutes x 3 doses as needed for chest pain. Patient not taking: Reported on 06/21/2017 12/24/12   Erlene Quan, PA-C  nystatin (MYCOSTATIN) 100000 UNIT/ML suspension Take 5 mLs by mouth 4 (four) times daily. 04/25/17   [provider]  ondansetron (ZOFRAN ODT) 4 MG disintegrating tablet Take 1 tablet (4 mg total) by mouth every 8 (eight) hours as needed for nausea or vomiting. Patient not taking: Reported on 06/21/2017 04/24/17   Duffy Bruce, MD  OXYCONTIN 20 MG T12A 12 hr tablet Take 20 mg by mouth every 8 (eight) hours. 12/28/14   [provider]  rosuvastatin (CRESTOR) 20 MG tablet Take 20 mg by mouth daily.    [provider]  triamcinolone cream  (KENALOG) 0.5 % 1 application when needed for rash 05/08/16   [provider]  vitamin E (VITAMIN E) 400 UNIT capsule Take 1 cap PO daily  x 1 week, then 1 cap PO BID Patient taking differently: Take 400 Units by mouth daily.  04/12/17   Andrew Gibson, MD   Allergies  Allergen Reactions  . Cortisone Other (See Comments)    MI  . Dronabinol Nausea Only    " Nausea and felt ''funny in the head"  . Latex Rash  . Lisinopril Rash and Other (See Comments)    Swelling of face  . Trental [Pentoxifylline] Other (See Comments)    Extreme fatigue, rash, vomiting  . Antihistamines, Chlorpheniramine-Type Itching  . Flexeril [Cyclobenzaprine] Itching   Review of Systems  Unable to perform ROS: Mental status change    Physical Exam  Constitutional: He appears well-developed. He appears lethargic. He appears ill.  HENT:  Mouth/Throat: Oropharynx is clear and moist.  Neurological: He appears lethargic.  Skin: Skin is warm and dry.    Vital Signs: There were no vitals taken for this visit.         SpO2:   O2 Device:  O2 Flow Rate: .   IO: Intake/output summary: No intake or output data in the 24 hours ending 08/12/17 1150  LBM:   Baseline Weight:   Most recent weight:       Palliative Assessment/Data: 30 % at best   Discussed with Dr Isidore Moos  Time In: 1000 Time Out: 1115 Time Total: 75 min Greater than 50%  of this time was spent counseling and coordinating care related to the above assessment and plan.  Signed by: Andrew Lessen, NP   Please contact Palliative Medicine Team phone at [phone] for questions and concerns.  For individual provider: See Shea Evans

## 2017-08-12 NOTE — Progress Notes (Signed)
Mr. Gudiel presents for follow up of radiation received: Previous Radiation Radiation treatment dates:06/11/17 Site/dose:Right cerebellar 68mmtarget /20 Gy in 1 fraction  Beams/energy:SBRT/SRT-3D, 6X-FFF  10/31/2016: PTV1 Left anterior parietal 12 mm was treated to 20 Gy in 1 fraction. PTV2 Left cerebellar 11 mm was treated to 20 Gy in 1 fraction. PTV3 Left temporal 6 mm was treated to 20 Gy in 1 fraction. PTV4 Left Caudate 6 mm was treated to 20 Gy in 1 fraction.  03/06/2016-03/21/2016: Whole brain and right hilum to 30 Gy in 10 fractions at 3 Gy per fraction  He has an appointment today with Palliative care. His next appointment with Dr. Earlie Server is 09/23/17. His family reports that he has become increasingly weak over the past week. He ambulated last Monday and is in a wheelchair today. He has not been eating well. He vomited last Thursday and has been not eating or drinking well since that time. He is sleeping a lot during the day. He completed decadron last Tuesday per Dr. Donnella Bi orders. He is chewing his pills, including oxycontin that has been provided by his wife. Family reports that he occasionally will look jaundice appearing.   BP 103/61   Pulse 66   Temp 98.5 F (36.9 C)   SpO2 98% Comment: room air  His family declined his weight.   MRI 07/26/17

## 2017-08-13 ENCOUNTER — Ambulatory Visit: Payer: Medicare Other | Admitting: Internal Medicine

## 2017-08-14 ENCOUNTER — Telehealth: Payer: Self-pay | Admitting: Medical Oncology

## 2017-08-14 DIAGNOSIS — G619 Inflammatory polyneuropathy, unspecified: Secondary | ICD-10-CM | POA: Diagnosis not present

## 2017-08-14 DIAGNOSIS — C3490 Malignant neoplasm of unspecified part of unspecified bronchus or lung: Secondary | ICD-10-CM | POA: Diagnosis not present

## 2017-08-14 DIAGNOSIS — K219 Gastro-esophageal reflux disease without esophagitis: Secondary | ICD-10-CM | POA: Diagnosis not present

## 2017-08-14 DIAGNOSIS — I25119 Atherosclerotic heart disease of native coronary artery with unspecified angina pectoris: Secondary | ICD-10-CM | POA: Diagnosis not present

## 2017-08-14 DIAGNOSIS — E039 Hypothyroidism, unspecified: Secondary | ICD-10-CM | POA: Diagnosis not present

## 2017-08-14 DIAGNOSIS — I252 Old myocardial infarction: Secondary | ICD-10-CM | POA: Diagnosis not present

## 2017-08-14 DIAGNOSIS — J449 Chronic obstructive pulmonary disease, unspecified: Secondary | ICD-10-CM | POA: Diagnosis not present

## 2017-08-14 DIAGNOSIS — E785 Hyperlipidemia, unspecified: Secondary | ICD-10-CM | POA: Diagnosis not present

## 2017-08-14 DIAGNOSIS — I10 Essential (primary) hypertension: Secondary | ICD-10-CM | POA: Diagnosis not present

## 2017-08-14 DIAGNOSIS — C7931 Secondary malignant neoplasm of brain: Secondary | ICD-10-CM | POA: Diagnosis not present

## 2017-08-14 DIAGNOSIS — R630 Anorexia: Secondary | ICD-10-CM | POA: Diagnosis not present

## 2017-08-14 DIAGNOSIS — N401 Enlarged prostate with lower urinary tract symptoms: Secondary | ICD-10-CM | POA: Diagnosis not present

## 2017-08-14 DIAGNOSIS — M1990 Unspecified osteoarthritis, unspecified site: Secondary | ICD-10-CM | POA: Diagnosis not present

## 2017-08-14 NOTE — Telephone Encounter (Signed)
Amber asking if Julien Nordmann will be attending per pt request. I told her taht he said he will be the attending.

## 2017-08-15 DIAGNOSIS — C7931 Secondary malignant neoplasm of brain: Secondary | ICD-10-CM | POA: Diagnosis not present

## 2017-08-15 DIAGNOSIS — R630 Anorexia: Secondary | ICD-10-CM | POA: Diagnosis not present

## 2017-08-15 DIAGNOSIS — I252 Old myocardial infarction: Secondary | ICD-10-CM | POA: Diagnosis not present

## 2017-08-15 DIAGNOSIS — I25119 Atherosclerotic heart disease of native coronary artery with unspecified angina pectoris: Secondary | ICD-10-CM | POA: Diagnosis not present

## 2017-08-15 DIAGNOSIS — Z515 Encounter for palliative care: Secondary | ICD-10-CM | POA: Insufficient documentation

## 2017-08-15 DIAGNOSIS — I10 Essential (primary) hypertension: Secondary | ICD-10-CM | POA: Diagnosis not present

## 2017-08-15 DIAGNOSIS — Z66 Do not resuscitate: Secondary | ICD-10-CM | POA: Insufficient documentation

## 2017-08-15 DIAGNOSIS — C3490 Malignant neoplasm of unspecified part of unspecified bronchus or lung: Secondary | ICD-10-CM | POA: Diagnosis not present

## 2017-08-16 DIAGNOSIS — R630 Anorexia: Secondary | ICD-10-CM | POA: Diagnosis not present

## 2017-08-16 DIAGNOSIS — C3490 Malignant neoplasm of unspecified part of unspecified bronchus or lung: Secondary | ICD-10-CM | POA: Diagnosis not present

## 2017-08-16 DIAGNOSIS — C7931 Secondary malignant neoplasm of brain: Secondary | ICD-10-CM | POA: Diagnosis not present

## 2017-08-16 DIAGNOSIS — I10 Essential (primary) hypertension: Secondary | ICD-10-CM | POA: Diagnosis not present

## 2017-08-16 DIAGNOSIS — I25119 Atherosclerotic heart disease of native coronary artery with unspecified angina pectoris: Secondary | ICD-10-CM | POA: Diagnosis not present

## 2017-08-16 DIAGNOSIS — I252 Old myocardial infarction: Secondary | ICD-10-CM | POA: Diagnosis not present

## 2017-08-19 DIAGNOSIS — I25119 Atherosclerotic heart disease of native coronary artery with unspecified angina pectoris: Secondary | ICD-10-CM | POA: Diagnosis not present

## 2017-08-19 DIAGNOSIS — I10 Essential (primary) hypertension: Secondary | ICD-10-CM | POA: Diagnosis not present

## 2017-08-19 DIAGNOSIS — I252 Old myocardial infarction: Secondary | ICD-10-CM | POA: Diagnosis not present

## 2017-08-19 DIAGNOSIS — C3490 Malignant neoplasm of unspecified part of unspecified bronchus or lung: Secondary | ICD-10-CM | POA: Diagnosis not present

## 2017-08-19 DIAGNOSIS — C7931 Secondary malignant neoplasm of brain: Secondary | ICD-10-CM | POA: Diagnosis not present

## 2017-08-19 DIAGNOSIS — R630 Anorexia: Secondary | ICD-10-CM | POA: Diagnosis not present

## 2017-08-20 DIAGNOSIS — C3490 Malignant neoplasm of unspecified part of unspecified bronchus or lung: Secondary | ICD-10-CM | POA: Diagnosis not present

## 2017-08-20 DIAGNOSIS — R630 Anorexia: Secondary | ICD-10-CM | POA: Diagnosis not present

## 2017-08-20 DIAGNOSIS — C7931 Secondary malignant neoplasm of brain: Secondary | ICD-10-CM | POA: Diagnosis not present

## 2017-08-20 DIAGNOSIS — I10 Essential (primary) hypertension: Secondary | ICD-10-CM | POA: Diagnosis not present

## 2017-08-20 DIAGNOSIS — I25119 Atherosclerotic heart disease of native coronary artery with unspecified angina pectoris: Secondary | ICD-10-CM | POA: Diagnosis not present

## 2017-08-20 DIAGNOSIS — I252 Old myocardial infarction: Secondary | ICD-10-CM | POA: Diagnosis not present

## 2017-08-21 DIAGNOSIS — I25119 Atherosclerotic heart disease of native coronary artery with unspecified angina pectoris: Secondary | ICD-10-CM | POA: Diagnosis not present

## 2017-08-21 DIAGNOSIS — I252 Old myocardial infarction: Secondary | ICD-10-CM | POA: Diagnosis not present

## 2017-08-21 DIAGNOSIS — R630 Anorexia: Secondary | ICD-10-CM | POA: Diagnosis not present

## 2017-08-21 DIAGNOSIS — C7931 Secondary malignant neoplasm of brain: Secondary | ICD-10-CM | POA: Diagnosis not present

## 2017-08-21 DIAGNOSIS — C3490 Malignant neoplasm of unspecified part of unspecified bronchus or lung: Secondary | ICD-10-CM | POA: Diagnosis not present

## 2017-08-21 DIAGNOSIS — I10 Essential (primary) hypertension: Secondary | ICD-10-CM | POA: Diagnosis not present

## 2017-08-23 ENCOUNTER — Telehealth: Payer: Self-pay

## 2017-08-23 DIAGNOSIS — R630 Anorexia: Secondary | ICD-10-CM | POA: Diagnosis not present

## 2017-08-23 DIAGNOSIS — I252 Old myocardial infarction: Secondary | ICD-10-CM | POA: Diagnosis not present

## 2017-08-23 DIAGNOSIS — I10 Essential (primary) hypertension: Secondary | ICD-10-CM | POA: Diagnosis not present

## 2017-08-23 DIAGNOSIS — C7931 Secondary malignant neoplasm of brain: Secondary | ICD-10-CM | POA: Diagnosis not present

## 2017-08-23 DIAGNOSIS — C3490 Malignant neoplasm of unspecified part of unspecified bronchus or lung: Secondary | ICD-10-CM | POA: Diagnosis not present

## 2017-08-23 DIAGNOSIS — I25119 Atherosclerotic heart disease of native coronary artery with unspecified angina pectoris: Secondary | ICD-10-CM | POA: Diagnosis not present

## 2017-08-23 NOTE — Telephone Encounter (Signed)
Abigail Butts RN with hospice called for order to move pt to [facility] today. VO given per Dr Julien Nordmann.

## 2017-08-24 DIAGNOSIS — I252 Old myocardial infarction: Secondary | ICD-10-CM | POA: Diagnosis not present

## 2017-08-24 DIAGNOSIS — I10 Essential (primary) hypertension: Secondary | ICD-10-CM | POA: Diagnosis not present

## 2017-08-24 DIAGNOSIS — C3490 Malignant neoplasm of unspecified part of unspecified bronchus or lung: Secondary | ICD-10-CM | POA: Diagnosis not present

## 2017-08-24 DIAGNOSIS — C7931 Secondary malignant neoplasm of brain: Secondary | ICD-10-CM | POA: Diagnosis not present

## 2017-08-24 DIAGNOSIS — I25119 Atherosclerotic heart disease of native coronary artery with unspecified angina pectoris: Secondary | ICD-10-CM | POA: Diagnosis not present

## 2017-08-24 DIAGNOSIS — R630 Anorexia: Secondary | ICD-10-CM | POA: Diagnosis not present

## 2017-08-25 DIAGNOSIS — I252 Old myocardial infarction: Secondary | ICD-10-CM | POA: Diagnosis not present

## 2017-08-25 DIAGNOSIS — R630 Anorexia: Secondary | ICD-10-CM | POA: Diagnosis not present

## 2017-08-25 DIAGNOSIS — C3490 Malignant neoplasm of unspecified part of unspecified bronchus or lung: Secondary | ICD-10-CM | POA: Diagnosis not present

## 2017-08-25 DIAGNOSIS — C7931 Secondary malignant neoplasm of brain: Secondary | ICD-10-CM | POA: Diagnosis not present

## 2017-08-25 DIAGNOSIS — I25119 Atherosclerotic heart disease of native coronary artery with unspecified angina pectoris: Secondary | ICD-10-CM | POA: Diagnosis not present

## 2017-08-25 DIAGNOSIS — I10 Essential (primary) hypertension: Secondary | ICD-10-CM | POA: Diagnosis not present

## 2017-08-26 ENCOUNTER — Other Ambulatory Visit: Payer: Self-pay | Admitting: Medical Oncology

## 2017-08-26 DIAGNOSIS — I10 Essential (primary) hypertension: Secondary | ICD-10-CM | POA: Diagnosis not present

## 2017-08-26 DIAGNOSIS — C7931 Secondary malignant neoplasm of brain: Secondary | ICD-10-CM | POA: Diagnosis not present

## 2017-08-26 DIAGNOSIS — I25119 Atherosclerotic heart disease of native coronary artery with unspecified angina pectoris: Secondary | ICD-10-CM | POA: Diagnosis not present

## 2017-08-26 DIAGNOSIS — C3490 Malignant neoplasm of unspecified part of unspecified bronchus or lung: Secondary | ICD-10-CM | POA: Diagnosis not present

## 2017-08-26 DIAGNOSIS — I252 Old myocardial infarction: Secondary | ICD-10-CM | POA: Diagnosis not present

## 2017-08-26 DIAGNOSIS — R630 Anorexia: Secondary | ICD-10-CM | POA: Diagnosis not present

## 2017-08-27 DIAGNOSIS — R630 Anorexia: Secondary | ICD-10-CM | POA: Diagnosis not present

## 2017-08-27 DIAGNOSIS — I10 Essential (primary) hypertension: Secondary | ICD-10-CM | POA: Diagnosis not present

## 2017-08-27 DIAGNOSIS — C7931 Secondary malignant neoplasm of brain: Secondary | ICD-10-CM | POA: Diagnosis not present

## 2017-08-27 DIAGNOSIS — I252 Old myocardial infarction: Secondary | ICD-10-CM | POA: Diagnosis not present

## 2017-08-27 DIAGNOSIS — C3490 Malignant neoplasm of unspecified part of unspecified bronchus or lung: Secondary | ICD-10-CM | POA: Diagnosis not present

## 2017-08-27 DIAGNOSIS — I25119 Atherosclerotic heart disease of native coronary artery with unspecified angina pectoris: Secondary | ICD-10-CM | POA: Diagnosis not present

## 2017-09-12 DEATH — deceased

## 2017-09-20 ENCOUNTER — Other Ambulatory Visit: Payer: Medicare Other

## 2017-09-23 ENCOUNTER — Ambulatory Visit: Payer: Medicare Other | Admitting: Internal Medicine

## 2017-09-26 ENCOUNTER — Encounter: Payer: Self-pay | Admitting: Radiation Therapy

## 2017-09-26 NOTE — Progress Notes (Signed)
Andrew Shaw passed away on 04-Sep-2017 with Hospice care.

## 2017-10-18 ENCOUNTER — Other Ambulatory Visit: Payer: Self-pay | Admitting: Nurse Practitioner
